# Patient Record
Sex: Female | Born: 1935
Health system: Southern US, Community
[De-identification: ages and names within clinical notes are randomized; demographics above are authoritative.]

## PROBLEM LIST (undated history)

## (undated) DIAGNOSIS — R0602 Shortness of breath: Secondary | ICD-10-CM

## (undated) DIAGNOSIS — M1990 Unspecified osteoarthritis, unspecified site: Secondary | ICD-10-CM

## (undated) DIAGNOSIS — I499 Cardiac arrhythmia, unspecified: Secondary | ICD-10-CM

## (undated) DIAGNOSIS — I10 Essential (primary) hypertension: Secondary | ICD-10-CM

## (undated) DIAGNOSIS — E079 Disorder of thyroid, unspecified: Secondary | ICD-10-CM

## (undated) DIAGNOSIS — G4730 Sleep apnea, unspecified: Secondary | ICD-10-CM

## (undated) DIAGNOSIS — E785 Hyperlipidemia, unspecified: Secondary | ICD-10-CM

## (undated) DIAGNOSIS — Z8489 Family history of other specified conditions: Secondary | ICD-10-CM

## (undated) DIAGNOSIS — E039 Hypothyroidism, unspecified: Secondary | ICD-10-CM

## (undated) DIAGNOSIS — K219 Gastro-esophageal reflux disease without esophagitis: Secondary | ICD-10-CM

## (undated) DIAGNOSIS — Z950 Presence of cardiac pacemaker: Secondary | ICD-10-CM

## (undated) HISTORY — DX: Sleep apnea, unspecified: G47.30

## (undated) HISTORY — DX: Cardiac arrhythmia, unspecified: I49.9

## (undated) HISTORY — PX: EYE SURGERY: SHX253

## (undated) HISTORY — PX: DILATION AND CURETTAGE OF UTERUS: SHX78

## (undated) HISTORY — DX: Hyperlipidemia, unspecified: E78.5

## (undated) HISTORY — PX: KNEE ARTHROSCOPY: SHX127

---

## 1997-07-16 ENCOUNTER — Other Ambulatory Visit: Admission: RE | Admit: 1997-07-16 | Discharge: 1997-07-16 | Payer: Self-pay | Admitting: Obstetrics and Gynecology

## 1998-07-15 ENCOUNTER — Other Ambulatory Visit: Admission: RE | Admit: 1998-07-15 | Discharge: 1998-07-15 | Payer: Self-pay | Admitting: Obstetrics and Gynecology

## 1999-07-22 ENCOUNTER — Other Ambulatory Visit: Admission: RE | Admit: 1999-07-22 | Discharge: 1999-07-22 | Payer: Self-pay | Admitting: Obstetrics and Gynecology

## 1999-09-10 ENCOUNTER — Encounter: Payer: Self-pay | Admitting: Orthopedic Surgery

## 1999-09-10 ENCOUNTER — Ambulatory Visit (HOSPITAL_COMMUNITY): Admission: RE | Admit: 1999-09-10 | Discharge: 1999-09-10 | Payer: Self-pay | Admitting: Orthopedic Surgery

## 1999-09-23 ENCOUNTER — Encounter: Admission: RE | Admit: 1999-09-23 | Discharge: 1999-09-30 | Payer: Self-pay | Admitting: Orthopedic Surgery

## 2000-09-09 ENCOUNTER — Other Ambulatory Visit: Admission: RE | Admit: 2000-09-09 | Discharge: 2000-09-09 | Payer: Self-pay | Admitting: Obstetrics and Gynecology

## 2001-07-06 HISTORY — PX: CARDIAC CATHETERIZATION: SHX172

## 2001-09-14 ENCOUNTER — Other Ambulatory Visit: Admission: RE | Admit: 2001-09-14 | Discharge: 2001-09-14 | Payer: Self-pay | Admitting: Obstetrics and Gynecology

## 2004-08-21 ENCOUNTER — Encounter (INDEPENDENT_AMBULATORY_CARE_PROVIDER_SITE_OTHER): Payer: Self-pay | Admitting: Family Medicine

## 2004-08-27 ENCOUNTER — Ambulatory Visit: Payer: Self-pay | Admitting: Internal Medicine

## 2004-08-27 ENCOUNTER — Ambulatory Visit (HOSPITAL_COMMUNITY): Admission: RE | Admit: 2004-08-27 | Discharge: 2004-08-27 | Payer: Self-pay | Admitting: Internal Medicine

## 2004-10-15 ENCOUNTER — Other Ambulatory Visit: Admission: RE | Admit: 2004-10-15 | Discharge: 2004-10-15 | Payer: Self-pay | Admitting: Obstetrics and Gynecology

## 2005-03-05 ENCOUNTER — Ambulatory Visit: Payer: Self-pay | Admitting: Internal Medicine

## 2005-03-12 ENCOUNTER — Ambulatory Visit (HOSPITAL_COMMUNITY): Admission: RE | Admit: 2005-03-12 | Discharge: 2005-03-12 | Payer: Self-pay | Admitting: Internal Medicine

## 2005-03-23 ENCOUNTER — Encounter (INDEPENDENT_AMBULATORY_CARE_PROVIDER_SITE_OTHER): Payer: Self-pay | Admitting: Family Medicine

## 2005-03-24 ENCOUNTER — Inpatient Hospital Stay (HOSPITAL_COMMUNITY): Admission: EM | Admit: 2005-03-24 | Discharge: 2005-03-27 | Payer: Self-pay | Admitting: Emergency Medicine

## 2005-03-26 ENCOUNTER — Encounter (INDEPENDENT_AMBULATORY_CARE_PROVIDER_SITE_OTHER): Payer: Self-pay | Admitting: Family Medicine

## 2005-03-26 LAB — CONVERTED CEMR LAB: Blood Glucose, Fasting: 106 mg/dL

## 2005-03-27 ENCOUNTER — Ambulatory Visit: Payer: Self-pay | Admitting: Internal Medicine

## 2005-03-27 ENCOUNTER — Encounter: Payer: Self-pay | Admitting: Internal Medicine

## 2005-04-09 ENCOUNTER — Ambulatory Visit: Payer: Self-pay | Admitting: Internal Medicine

## 2005-05-11 ENCOUNTER — Ambulatory Visit: Payer: Self-pay | Admitting: Internal Medicine

## 2005-06-09 ENCOUNTER — Ambulatory Visit: Payer: Self-pay | Admitting: Internal Medicine

## 2005-06-22 ENCOUNTER — Ambulatory Visit (HOSPITAL_COMMUNITY): Admission: RE | Admit: 2005-06-22 | Discharge: 2005-06-22 | Payer: Self-pay | Admitting: Internal Medicine

## 2005-06-22 ENCOUNTER — Ambulatory Visit: Payer: Self-pay | Admitting: Internal Medicine

## 2005-06-23 ENCOUNTER — Encounter (INDEPENDENT_AMBULATORY_CARE_PROVIDER_SITE_OTHER): Payer: Self-pay | Admitting: Internal Medicine

## 2005-07-07 ENCOUNTER — Ambulatory Visit: Payer: Self-pay | Admitting: Internal Medicine

## 2005-08-21 ENCOUNTER — Encounter (HOSPITAL_COMMUNITY): Admission: RE | Admit: 2005-08-21 | Discharge: 2005-09-20 | Payer: Self-pay | Admitting: Internal Medicine

## 2005-09-04 ENCOUNTER — Ambulatory Visit: Payer: Self-pay | Admitting: Internal Medicine

## 2005-11-05 ENCOUNTER — Ambulatory Visit: Payer: Self-pay | Admitting: Internal Medicine

## 2005-12-03 ENCOUNTER — Ambulatory Visit: Payer: Self-pay | Admitting: Internal Medicine

## 2006-02-18 ENCOUNTER — Encounter: Payer: Self-pay | Admitting: Family Medicine

## 2006-02-18 DIAGNOSIS — I498 Other specified cardiac arrhythmias: Secondary | ICD-10-CM | POA: Insufficient documentation

## 2006-02-18 DIAGNOSIS — K6389 Other specified diseases of intestine: Secondary | ICD-10-CM

## 2006-02-18 DIAGNOSIS — M1990 Unspecified osteoarthritis, unspecified site: Secondary | ICD-10-CM | POA: Insufficient documentation

## 2006-02-18 DIAGNOSIS — F329 Major depressive disorder, single episode, unspecified: Secondary | ICD-10-CM

## 2006-02-18 DIAGNOSIS — E039 Hypothyroidism, unspecified: Secondary | ICD-10-CM | POA: Insufficient documentation

## 2006-02-18 DIAGNOSIS — M545 Low back pain: Secondary | ICD-10-CM

## 2006-02-18 DIAGNOSIS — D131 Benign neoplasm of stomach: Secondary | ICD-10-CM

## 2006-02-18 DIAGNOSIS — I10 Essential (primary) hypertension: Secondary | ICD-10-CM | POA: Insufficient documentation

## 2006-02-18 DIAGNOSIS — Z8719 Personal history of other diseases of the digestive system: Secondary | ICD-10-CM

## 2006-02-18 DIAGNOSIS — K219 Gastro-esophageal reflux disease without esophagitis: Secondary | ICD-10-CM | POA: Insufficient documentation

## 2006-02-25 ENCOUNTER — Ambulatory Visit: Payer: Self-pay | Admitting: Internal Medicine

## 2006-03-29 ENCOUNTER — Ambulatory Visit: Payer: Self-pay | Admitting: Internal Medicine

## 2006-03-29 LAB — CONVERTED CEMR LAB
ALT: 13 units/L (ref 0–35)
AST: 16 units/L (ref 0–37)
BUN: 20 mg/dL (ref 6–23)
CO2: 24 meq/L (ref 19–32)
Cholesterol: 175 mg/dL (ref 0–200)
Creatinine, Ser: 0.92 mg/dL (ref 0.40–1.20)
Glucose, Bld: 97 mg/dL (ref 70–99)
LDL Cholesterol: 98 mg/dL (ref 0–99)
Potassium: 4.5 meq/L (ref 3.5–5.3)
Sodium: 139 meq/L (ref 135–145)
TSH: 1.252 microintl units/mL (ref 0.350–5.50)
Total CHOL/HDL Ratio: 2.8
Triglycerides: 73 mg/dL (ref <150)

## 2006-06-28 ENCOUNTER — Telehealth (INDEPENDENT_AMBULATORY_CARE_PROVIDER_SITE_OTHER): Payer: Self-pay | Admitting: Internal Medicine

## 2006-07-07 ENCOUNTER — Encounter (INDEPENDENT_AMBULATORY_CARE_PROVIDER_SITE_OTHER): Payer: Self-pay | Admitting: Internal Medicine

## 2006-07-07 ENCOUNTER — Ambulatory Visit: Payer: Self-pay | Admitting: Internal Medicine

## 2006-08-23 LAB — CONVERTED CEMR LAB: Pap Smear: NORMAL

## 2006-09-29 ENCOUNTER — Telehealth (INDEPENDENT_AMBULATORY_CARE_PROVIDER_SITE_OTHER): Payer: Self-pay | Admitting: Internal Medicine

## 2006-09-30 ENCOUNTER — Ambulatory Visit: Payer: Self-pay | Admitting: Internal Medicine

## 2006-09-30 DIAGNOSIS — R5381 Other malaise: Secondary | ICD-10-CM

## 2006-09-30 DIAGNOSIS — E785 Hyperlipidemia, unspecified: Secondary | ICD-10-CM | POA: Insufficient documentation

## 2006-09-30 DIAGNOSIS — R209 Unspecified disturbances of skin sensation: Secondary | ICD-10-CM

## 2006-09-30 DIAGNOSIS — R5383 Other fatigue: Secondary | ICD-10-CM

## 2007-02-21 ENCOUNTER — Encounter (INDEPENDENT_AMBULATORY_CARE_PROVIDER_SITE_OTHER): Payer: Self-pay | Admitting: Internal Medicine

## 2007-05-13 ENCOUNTER — Ambulatory Visit: Payer: Self-pay | Admitting: Internal Medicine

## 2007-05-14 LAB — CONVERTED CEMR LAB
ALT: 17 units/L (ref 0–35)
AST: 16 units/L (ref 0–37)
Albumin: 4.1 g/dL (ref 3.5–5.2)
Alkaline Phosphatase: 96 units/L (ref 39–117)
CO2: 22 meq/L (ref 19–32)
Chloride: 103 meq/L (ref 96–112)
Cholesterol: 184 mg/dL (ref 0–200)
Free T4: 1.71 ng/dL (ref 0.89–1.80)
Glucose, Bld: 106 mg/dL — ABNORMAL HIGH (ref 70–99)
HDL: 55 mg/dL (ref >39)
TSH: 1.582 microintl units/mL (ref 0.350–5.50)
Total Bilirubin: 0.5 mg/dL (ref 0.3–1.2)
Total CHOL/HDL Ratio: 3.3
Total Protein: 6.8 g/dL (ref 6.0–8.3)

## 2007-05-16 ENCOUNTER — Telehealth (INDEPENDENT_AMBULATORY_CARE_PROVIDER_SITE_OTHER): Payer: Self-pay | Admitting: *Deleted

## 2007-06-12 ENCOUNTER — Emergency Department (HOSPITAL_COMMUNITY): Admission: EM | Admit: 2007-06-12 | Discharge: 2007-06-12 | Payer: Self-pay | Admitting: Emergency Medicine

## 2007-06-29 ENCOUNTER — Ambulatory Visit: Payer: Self-pay | Admitting: Internal Medicine

## 2007-06-29 DIAGNOSIS — M940 Chondrocostal junction syndrome [Tietze]: Secondary | ICD-10-CM | POA: Insufficient documentation

## 2007-07-14 ENCOUNTER — Ambulatory Visit: Payer: Self-pay | Admitting: Internal Medicine

## 2007-07-14 ENCOUNTER — Encounter (INDEPENDENT_AMBULATORY_CARE_PROVIDER_SITE_OTHER): Payer: Self-pay | Admitting: Internal Medicine

## 2007-08-04 ENCOUNTER — Encounter (INDEPENDENT_AMBULATORY_CARE_PROVIDER_SITE_OTHER): Payer: Self-pay | Admitting: Internal Medicine

## 2007-09-09 ENCOUNTER — Encounter (INDEPENDENT_AMBULATORY_CARE_PROVIDER_SITE_OTHER): Payer: Self-pay | Admitting: Internal Medicine

## 2007-09-12 ENCOUNTER — Ambulatory Visit (HOSPITAL_COMMUNITY): Admission: RE | Admit: 2007-09-12 | Discharge: 2007-09-12 | Payer: Self-pay

## 2007-09-12 ENCOUNTER — Ambulatory Visit: Payer: Self-pay | Admitting: Internal Medicine

## 2007-09-13 ENCOUNTER — Ambulatory Visit: Payer: Self-pay | Admitting: Internal Medicine

## 2007-09-13 LAB — CONVERTED CEMR LAB
ALT: 13 units/L (ref 0–35)
AST: 15 units/L (ref 0–37)
Basophils Relative: 1 % (ref 0–1)
CO2: 25 meq/L (ref 19–32)
Creatinine, Ser: 0.81 mg/dL (ref 0.40–1.20)
Eosinophils Absolute: 0.2 10*3/uL (ref 0.0–0.7)
Glucose, Bld: 104 mg/dL — ABNORMAL HIGH (ref 70–99)
HCT: 40.7 % (ref 36.0–46.0)
Hemoglobin: 13.1 g/dL (ref 12.0–15.0)
Monocytes Relative: 10 % (ref 3–12)
Neutrophils Relative %: 48 % (ref 43–77)
Platelets: 219 10*3/uL (ref 150–400)
Potassium: 4 meq/L (ref 3.5–5.3)
RDW: 14.3 % (ref 11.5–15.5)
Total Protein: 6.5 g/dL (ref 6.0–8.3)
WBC: 5.2 10*3/uL (ref 4.0–10.5)

## 2007-09-20 ENCOUNTER — Telehealth (INDEPENDENT_AMBULATORY_CARE_PROVIDER_SITE_OTHER): Payer: Self-pay | Admitting: Internal Medicine

## 2007-11-04 ENCOUNTER — Encounter (INDEPENDENT_AMBULATORY_CARE_PROVIDER_SITE_OTHER): Payer: Self-pay | Admitting: Internal Medicine

## 2008-01-05 ENCOUNTER — Ambulatory Visit: Payer: Self-pay | Admitting: Internal Medicine

## 2008-01-05 DIAGNOSIS — R197 Diarrhea, unspecified: Secondary | ICD-10-CM | POA: Insufficient documentation

## 2008-01-05 DIAGNOSIS — L538 Other specified erythematous conditions: Secondary | ICD-10-CM | POA: Insufficient documentation

## 2008-01-06 ENCOUNTER — Encounter (INDEPENDENT_AMBULATORY_CARE_PROVIDER_SITE_OTHER): Payer: Self-pay | Admitting: Internal Medicine

## 2008-01-11 ENCOUNTER — Encounter (INDEPENDENT_AMBULATORY_CARE_PROVIDER_SITE_OTHER): Payer: Self-pay | Admitting: Internal Medicine

## 2008-04-25 ENCOUNTER — Encounter (INDEPENDENT_AMBULATORY_CARE_PROVIDER_SITE_OTHER): Payer: Self-pay | Admitting: Internal Medicine

## 2008-04-30 ENCOUNTER — Ambulatory Visit: Payer: Self-pay | Admitting: Internal Medicine

## 2008-04-30 DIAGNOSIS — M25559 Pain in unspecified hip: Secondary | ICD-10-CM

## 2008-04-30 DIAGNOSIS — J310 Chronic rhinitis: Secondary | ICD-10-CM

## 2008-04-30 DIAGNOSIS — M25569 Pain in unspecified knee: Secondary | ICD-10-CM

## 2008-05-23 ENCOUNTER — Encounter (INDEPENDENT_AMBULATORY_CARE_PROVIDER_SITE_OTHER): Payer: Self-pay | Admitting: Internal Medicine

## 2008-06-15 ENCOUNTER — Telehealth (INDEPENDENT_AMBULATORY_CARE_PROVIDER_SITE_OTHER): Payer: Self-pay | Admitting: Internal Medicine

## 2008-06-15 ENCOUNTER — Ambulatory Visit: Payer: Self-pay | Admitting: Internal Medicine

## 2008-06-15 DIAGNOSIS — M779 Enthesopathy, unspecified: Secondary | ICD-10-CM | POA: Insufficient documentation

## 2008-06-20 ENCOUNTER — Telehealth (INDEPENDENT_AMBULATORY_CARE_PROVIDER_SITE_OTHER): Payer: Self-pay | Admitting: Internal Medicine

## 2008-07-23 ENCOUNTER — Encounter (INDEPENDENT_AMBULATORY_CARE_PROVIDER_SITE_OTHER): Payer: Self-pay | Admitting: Internal Medicine

## 2008-07-25 ENCOUNTER — Encounter (INDEPENDENT_AMBULATORY_CARE_PROVIDER_SITE_OTHER): Payer: Self-pay | Admitting: Internal Medicine

## 2008-08-08 ENCOUNTER — Encounter (INDEPENDENT_AMBULATORY_CARE_PROVIDER_SITE_OTHER): Payer: Self-pay | Admitting: Internal Medicine

## 2008-12-12 ENCOUNTER — Encounter (INDEPENDENT_AMBULATORY_CARE_PROVIDER_SITE_OTHER): Payer: Self-pay | Admitting: Internal Medicine

## 2008-12-19 ENCOUNTER — Encounter (INDEPENDENT_AMBULATORY_CARE_PROVIDER_SITE_OTHER): Payer: Self-pay | Admitting: Internal Medicine

## 2009-02-26 ENCOUNTER — Ambulatory Visit: Payer: Self-pay | Admitting: Internal Medicine

## 2009-03-05 ENCOUNTER — Telehealth: Payer: Self-pay | Admitting: Internal Medicine

## 2009-03-19 ENCOUNTER — Encounter: Payer: Self-pay | Admitting: Internal Medicine

## 2009-05-11 ENCOUNTER — Emergency Department (HOSPITAL_COMMUNITY): Admission: EM | Admit: 2009-05-11 | Discharge: 2009-05-11 | Payer: Self-pay | Admitting: Emergency Medicine

## 2009-06-14 ENCOUNTER — Encounter: Admission: RE | Admit: 2009-06-14 | Discharge: 2009-06-14 | Payer: Self-pay | Admitting: Orthopedic Surgery

## 2009-09-13 ENCOUNTER — Encounter (INDEPENDENT_AMBULATORY_CARE_PROVIDER_SITE_OTHER): Payer: Self-pay | Admitting: *Deleted

## 2009-12-23 ENCOUNTER — Ambulatory Visit (HOSPITAL_COMMUNITY): Admission: RE | Admit: 2009-12-23 | Discharge: 2009-12-23 | Payer: Self-pay | Admitting: Cardiovascular Disease

## 2010-03-26 ENCOUNTER — Encounter
Admission: RE | Admit: 2010-03-26 | Discharge: 2010-03-26 | Payer: Self-pay | Source: Home / Self Care | Attending: Internal Medicine | Admitting: Internal Medicine

## 2010-04-20 LAB — CONVERTED CEMR LAB
BUN: 23 mg/dL (ref 6–23)
Blood Glucose, Fingerstick: 113
Calcium: 9 mg/dL (ref 8.4–10.5)
Chloride: 106 meq/L (ref 96–112)
Creatinine, Ser: 0.94 mg/dL (ref 0.40–1.20)
Free T4: 1.47 ng/dL (ref 0.89–1.80)
Glucose, Bld: 100 mg/dL — ABNORMAL HIGH (ref 70–99)
Potassium: 4.7 meq/L (ref 3.5–5.3)
Sodium: 140 meq/L (ref 135–145)
TSH: 0.945 microintl units/mL (ref 0.350–4.50)
Total Bilirubin: 0.3 mg/dL (ref 0.3–1.2)
Vit D, 1,25-Dihydroxy: 45 (ref 30–89)

## 2010-04-22 NOTE — Letter (Signed)
Summary: Recall Office Visit  [facility]  [address]   [place], [place] [address]   Phone: [phone]  Fax: [phone]      September 13, 2009   Denise Pugh [address] [facility] [address] [place], [place]  [address] 25-Sep-1928   Dear [name],   According to our records, it is time for you to schedule a follow-up office visit with [place].   At your convenience, please call [phone] to schedule an office visit. If you have any questions, concerns, or feel that this letter is in error, we would appreciate your call.   Sincerely,    Diana Eves  [facility] Ph: [phone]   Fax: [phone]

## 2010-08-05 NOTE — Assessment & Plan Note (Signed)
NAME:  Denise Pugh, Denise Pugh                  CHART#:  [id number]   DATE:  07/14/2007                       DOB:  1928-04-17   PRIMARY CARE PHYSICIAN:  Dr. Jen Mow.   CHIEF COMPLAINT:  Follow up GERD.   SUBJECTIVE:  Denise Pugh is a 77 year old female with a history of chronic  GERD and constipation.  She has been doing very well on Protonix 40 mg  b.i.d.  Last EGD was in 2007, which showed benign gastric polyps and was  otherwise normal.  Her last colonoscopy was August 27, 2004.  She was found  to have an anal papilla and internal hemorrhoids, diffuse pigmented  colonic mucosa consistent with melanosis coli and left-sided  diverticula.  Otherwise the exam was normal.  She is very pleased with  her health right now.  She denies any dysphagia or odynophagia.  Denies  anorexia or satiety.  Her weight is steadily increasing.  She is not  having to use MiraLax as frequently as she once did as she has increased  her dietary fiber and this is working well for her.  She has been seen  by her gynecologist and had a DEXA scan for osteoporosis, which was  normal per her report.   CURRENT MEDICATIONS:  See the list from July 14, 2007.   ALLERGIES:  TETANUS AND PENICILLIN.   OBJECTIVE:  VITAL SIGNS:  Weight 186 pounds, height 64 inches, temp  97.9, blood pressure 120/74 and pulse 60.  GENERAL:  Denise Pugh is a well-developed, well-nourished Caucasian female  in no acute distress.  HEENT:  Sclerae nonicteric.  Conjunctivae pink.  Oropharynx pink, moist  without any lesions.  CHEST:  Heart regular rhythm rate and rhythm.  Normal S1, S2.  ABDOMEN:  Positive bowel sounds x4.  No bruits auscultated.  Soft,  nontender without palpable mass, megaly, tenderness, or guarding.  EXTREMITIES:  Without clubbing or edema.   ASSESSMENT:  Denise Pugh is a 77 year old female with chronic  gastroesophageal reflux disease, doing well on b.i.d. proton pump  inhibitor.  I suspect that she may be able to decrease her dosage to  once daily.   PLAN:  1. A trial of decreasing Protonix to 40 mg daily.  If she has      problems, she will go back on b.i.d. dosing.  2. Follow-up office visit in 2 years with Dr. Jena Gauss, or sooner if she      has any problems.  3. She is going to follow up with Dr. Jen Mow regarding her health      maintenance issues.       Lorenza Burton, N.P.  Electronically Signed     R. Roetta Sessions, M.D.  Electronically Signed    KJ/MEDQ  D:  07/14/2007  T:  07/14/2007  Job:  [id number]

## 2010-08-08 NOTE — H&P (Signed)
[name], [name]                 ACCOUNT NO.:  [id number]   MEDICAL RECORD NO.:  [id number]          PATIENT TYPE:  INP   LOCATION:  ED99                          FACILITY:  [facility]   PHYSICIAN:  Margaretmary Dys, M.D.DATE OF BIRTH:  [birth year]   DATE OF ADMISSION:  03/24/2005  DATE OF DISCHARGE:  LH                                HISTORY & PHYSICAL   PRIMARY CARE PHYSICIAN:  Donna Bernard, M.D.   ADMISSION DIAGNOSES:  1.  Left upper back pain.  2.  Chest pain, rule out myocardial infarction.  3.  Hypertension.   CHIEF COMPLAINT:  Pain just below the shoulder blade and substernal chest  pain of one day's duration.   HISTORY OF PRESENT ILLNESS:  [name] is a 77 year old Caucasian  female who presented to the emergency room today with complaints initially  of pain involving the back under her left shoulder blade. She also had some  substernal pressure which she described as diffuse and the feeling of  somebody sitting on her chest. The pain from her substernal radiated to her  back. The pain was constant with no aggravating or relieving factors. On  arrival here in the emergency room, she described the pain as 5/10. The  patient has received some morphine with some improvement in the pain.  Currently, she says the pain is 0/10. She also had some nausea. She felt a  little cold and sweaty but denies any classic symptoms of diaphoresis. She  had no headaches, no dizziness, or lightheadedness. She has no fevers or  chills. The pain is not pleuritic. She denies any cough. No shortness of  breath. No decreased exercise tolerance. She has had no edema in her lower  extremities. She denies any orthopnea, paroxysm nocturnal dyspnea. She has  had no palpitations.   Initial evaluation in the emergency room revealed her blood pressure was  significantly elevated at 171/75. EKG showed left ventricular hypertrophy.  Chest x-ray showed cardiomegaly. Cardiac enzymes were negative.  However, the  patient's history of the pain has significant left ventricular hypertrophy  and history of arrhythmia in the past. It was decided that it we would admit  her to evaluate for possible coronary artery disease.   REVIEW OF SYSTEMS:  A 10-point review of system is otherwise negative except  as mentioned in the history of present illness.   PAST MEDICAL HISTORY:  1.  Hypertension.  2.  Gastroesophageal reflux disease.  3.  Arrhythmia.  4.  Depression.  5.  Hypothyroidism.   PAST SURGICAL HISTORY:  Arthroscopic knee surgery.   MEDICATIONS:  1.  She is on Ditropan 10 milligrams p.o. daily.  2.  Protonix 40 milligrams p.o. b.i.d.  3.  Synthroid 75 mcg once a day.  4.  Lexapro 10 milligrams p.o. daily.  5.  Aleve p.r.n.  6.  Tarka 2/240 milligrams p.o. once a day.   ALLERGIES:  The patient reports allergy to PENICILLIN, TETANUS TOXOID which  she reports an anaphylaxis reaction.   FAMILY HISTORY:  Father died from Parkinson's disease. Both parents died in  their  late 57s. No significant history of cardiac disease. She has a brother  who is otherwise healthy.   SOCIAL HISTORY:  The patient is married. Has two children who are healthy.   She is lifelong nonsmoker. Does not drink alcohol. She remains fairly  active. She is retired from [employer]. She was a Engineer, materials. She denies any  alcohol or illicit drug use.   PHYSICAL EXAMINATION:  GENERAL:  Conscious, alert, comfortable, pleasant,  not in acute distress.  VITAL SIGNS:  Her blood pressure was 171/75, pulse of 58, respiratory rate  of 20, temperature 97 degrees Fahrenheit. Pain was 5/10 on arrival,  currently is 0/10. Pulse oximetry was 97% on room air.  HEENT:  Normocephalic and atraumatic. Oral mucosa was moist with no  exudates.  NECK:  Supple. No JVD and no lymphadenopathy.  LUNGS:  Clear clinically with good air entry bilaterally.  HEART:  S1 and S2 regular. No S3, S4, gallops or rubs.  ABDOMEN:  Soft and  nontender. Bowel sounds were positive.  EXTREMITIES:  No pitting pedal edema.  MUSCULOSKELETAL:  There was some tenderness over the left scapula  inferiorly.  EXTREMITIES:  No pitting or pedal edema.  CNS:  Grossly intact with no focal deficits.   LABORATORY DATA:  White blood cell count was 9.4, hemoglobin of 13.3,  hematocrit 39.6, platelet count 226,000 with no left shift. Sodium 138,  potassium 3.8, chloride of 102, CO2 29, glucose 110, BUN 16, creatinine 1.0,  calcium 8.6, cardiac enzymes were negative x2. Other cardiac markers were  negative x2. Chest x-ray showed stable cardiomegaly. A 12-lead EKG shows  sinus bradycardia at a rate of 58. No acute ST or T changes noted. The  patient has significant left ventricular hypertrophy.   ASSESSMENT/PLAN:  [name] is a 77 year old Caucasian female presenting  with symptoms that may overlap between typical and atypical angina. She does  have risk factors including hypertension and left ventricular hypertrophy on  EKG and chest x-ray.   The plan is to admit her at this time. We will rule out for a myocardial  infarction with cardiac enzymes. We will request cardiology to see her. May  resume all her previous medications. Gastrointestinal prophylaxis will be  with Protonix as the patient is taking at home. Deep vein thrombosis  prophylaxis with Lovenox.   CODE STATUS:  The patient is a full code.   I discussed the above plan with her and the potential that she may have a  stress test. We may proceed on to cardiac catheterization depending on the  cardiologist's opinion in the morning. The patient verbalized full  understanding.      Margaretmary Dys, M.D.  Electronically Signed     AM/MEDQ  D:  03/24/2005  T:  03/24/2005  Job:  [id number]

## 2010-08-08 NOTE — Op Note (Signed)
NAME:  Denise Pugh, Denise Pugh                 ACCOUNT NO.:  [id number]   MEDICAL RECORD NO.:  [id number]          PATIENT TYPE:  INP   LOCATION:  A201                          FACILITY:  [facility]   PHYSICIAN:  R. Roetta Sessions, M.D. DATE OF BIRTH:  [birth year]   DATE OF PROCEDURE:  03/27/2005  DATE OF DISCHARGE:                                 OPERATIVE REPORT   PROCEDURE:  Esophagogastroduodenoscopy with KOH esophageal brushing and  gastric biopsy.   INDICATIONS FOR PROCEDURE:  The patient is a 77 year old lady admitted to  the hospital with left scapular pain. Workup for this problem has been  unrevealing. Symptoms felt to be coming from musculoskeletal origin. She has  long-standing gastroesophageal reflux disease and has never had an EGD. EGD  is now being done prior to her going home. This approach has been discussed  with the patient at length. Potential risks, benefits, and alternatives have  been reviewed and questions answered. She is agreeable. Please see  documentation in the medical record.   PROCEDURE NOTE:  O2 saturation, blood pressure, pulse, and respirations were  monitored throughout the entire procedure. Conscious sedation with Versed 3  mg IV and Demerol 75 mg IV in divided doses.   INSTRUMENT:  Olympus video chip system.   FINDINGS:  Examination of the tubular esophagus revealed ribbon-like plaques  in a linear distribution throughout most of the proximal mid esophagus.  Please see photos. They were not really cheesy, consistent with what is  typically seen with candida esophagitis. However, they did certainly stand  out. Otherwise, esophageal mucosa appeared normal. EGD junction was easily  traversed.   Stomach:  Gastric cavity was empty and insufflated well with air. Thorough  examination of gastric mucosa including retroflexed view of the proximal  stomach and esophagogastric junction was undertaken. The patient had  numerous 3 to 6 mm polyps, dozens throughout the  gastric mucosa. In the  antrum, there is a 1-cm submucosa lesion with a central erythematous  depression consistent with most likely a leiomyoma. Please see photos.  Otherwise gastric mucosa appeared normal. Pylorus patent and easily  traversed. Examination of bulb and second portion revealed no abnormalities.   THERAPEUTIC/DIAGNOSTIC MANEUVERS:  1.  The esophageal mucosa was brushed for KOH prep.  2.  One of the polyps in the stomach was biopsied for histologic study.  3.  The head of the submucosa lesion with a central depression was also      biopsied for histologic study.   The patient tolerated the procedure well and was reactive to endoscopy.   IMPRESSION:  1.  Some ribbon-like plaques of the esophageal mucosa as described above,      status post KOH brush. Otherwise normal esophagus.  2.  Multiple gastric polyps. One biopsied. Submucosal lesion with central      depression in the antrum suggestive of a leiomyoma, also biopsied. The      remainder of the gastric mucosa appeared normal. Patent pylorus. Normal      D1 and D2.   RECOMMENDATIONS:  1.  Advance diet as tolerated.  2.  From a GI standpoint, this lady can go home later today. We will follow      up on pending studies. I will be glad to see this nice lady back in the      office in a few weeks.      Jonathon Bellows, M.D.  Electronically Signed     RMR/MEDQ  D:  03/27/2005  T:  03/27/2005  Job:  [id number]   cc:   Donna Bernard, M.D.  Fax: [phone]

## 2010-08-08 NOTE — Discharge Summary (Signed)
NAME:  Denise Pugh, Denise Pugh NO.:  [id number]   MEDICAL RECORD NO.:  [id number]          PATIENT TYPE:  INP   LOCATION:  A201                          FACILITY:  [facility]   PHYSICIAN:  Donna Bernard, M.D.DATE OF BIRTH:  1928-03-25   DATE OF ADMISSION:  03/24/2005  DATE OF DISCHARGE:  01/05/2007LH                                 DISCHARGE SUMMARY   DISCHARGE DIAGNOSES:  1.  Severe back pain.  2.  Pulmonary embolus ruled out.  3.  Likely musculoskeletal injury.  4.  Myocardial infarction ruled out.  5.  Hypertension.   DISPOSITION:  The patient is discharged to home.   DISCHARGE MEDICATIONS:  1.  Cortodoxone 500 mg one three times a day.  2.  Percocet 5/325 one as needed every 4 hours for severe pain.  3.  Reglan 5 mg orally as needed for nausea.   HISTORY OF PRESENT ILLNESS:  Please see initial H&P as dictated.   HOSPITAL COURSE:  This patient is a 77 year old, white female with a  relatively benign prior medical history.  She presented to the emergency  room with complaints of severe pain.  She noted the pain was primarily in  the left shoulder blade region with some radiation to the anterior chest.  The patient had some nausea associated with it.  She also felt a little  sweaty with it.  No significant shortness of breath or pleuritic component  noted.  The patient presented to the emergency room and she had cardiac  enzymes which were negative.  EKG was fairly benign.  The patient had  moderately severe pain.  The patient also is in the midst of a workup by Dr.  Jena Gauss for reflux type symptoms.  Cardiologists were consulted and they  assessed the patient.  They thought it was very unlikely that it was  ischemic pain.  They felt it was likely musculoskeletal pain.  CT scan of  the chest was done to rule out aneurysm or PE.  This returned negative.  GI  folks were asked to assess the patient during the midst of evaluating her  for reflux.  They felt that the  patient likely was not experiencing reflux.  In addition, they had recommended pressing on and doing a HIDA scan.  This  was performed with no significant abnormality noted.  An upper endoscopy was  performed.  Please see Dr. Luvenia Starch notes for further results.  It was the  feeling of GI that her symptoms did not correlate with reflux.   On the day of discharge, the patient was feeling better.  She still had  significant discomfort, however, she was improved.  The patient was advised  that this was likely musculoskeletal pain that would probably take quite  awhile to fade on out.  We put her on antiinflammatory muscle spasm  medicine.  The patient was discharged home with diagnoses and disposition as  noted above.      Donna Bernard, M.D.  Electronically Signed     WSL/MEDQ  D:  04/05/2005  T:  04/06/2005  Job:  [id number] 

## 2010-08-08 NOTE — Op Note (Signed)
NAME:  Denise Pugh, BRANSCUM                 ACCOUNT NO.:  [id number]   MEDICAL RECORD NO.:  [id number]          PATIENT TYPE:  AMB   LOCATION:  DAY                           FACILITY:  [facility]   PHYSICIAN:  R. Roetta Sessions, M.D. DATE OF BIRTH:  10-04-1928   DATE OF PROCEDURE:  08/27/2004  DATE OF DISCHARGE:                                 OPERATIVE REPORT   PROCEDURE:  Screening colonoscopy.   INDICATIONS FOR PROCEDURE:  The patient is a 77 year old Caucasian female  referred by Dr. Lubertha South for colon cancer screening.  She had a  sigmoidoscopy back in 2000, which revealed only left-sided diverticula.  She  has not had any GI symptoms, no lower GI tract symptoms.  There is no family  history of colorectal neoplasia.  She has never had her entire lower GI  tract imaged.  Colonoscopy is now being down as a standard screening  maneuver.  The procedure discussed with the patient at length, potential  risks, benefits and alternatives have been reviewed, and questions answered.  She was agreeable.  Please see documentation in the medical record.   PREOPERATIVE MEDICATIONS:  Gentamicin 110 mg IV, and vancomycin 1 gram IV  for SBE prophylaxis.   PROCEDURE NOTE:  O2 saturation, blood pressure, pulse and respirations  monitored throughout the entire procedure.  Conscious sedation and Versed 4  mg IV, Demerol 75 mg IV in divided doses.   INSTRUMENT:  Olympus video chip system.   FINDINGS:  Digital rectal examination revealed no abnormalities.  Endoscopic  findings:  The prep was good.   Rectal and colon examination:  Rectal mucosa including retroflexed view of  the anal verge revealed only anal papilla and internal hemorrhoids.   COLON:  Colonic mucosa was surveyed from the rectosigmoid junction to the  left transverse right colon, the appendiceal orifice, ileocecal valve and  cecum.  The structures were well seen and photographed for the record.  From  this level, the scope was slowly  withdrawn while previously mentioned  mucosal surfaces were again seen.  The patient was noted to have to have the  following abnormalities:  (1) Extensive left-sided diverticula.  (2)  Diffusely pigmented colonic mucosa consistent with melanosis coli.  (3) The  remainder of the colonic mucosa appeared normal.  The patient tolerated the  procedure well and was reactive in endoscopy.   IMPRESSION:  1.  Anal papilla and internal hemorrhoids, otherwise normal rectum.  2.  Diffusely pigmented colonic mucosa consistent with melanosis coli.  3.  Left-sided diverticula.  4.  The remainder of the colonic mucosa appeared normal.   RECOMMENDATIONS:  1.  Diverticulosis literature provided to Ms. Deane.  2.  Repeat screening colonoscopy in 10 years.       RMR/MEDQ  D:  08/27/2004  T:  08/27/2004  Job:  [id number]   cc:   Nicki Guadalajara, M.D.  [phone] [address], [address]  [place], [place] [address]  Fax: [phone]   W. Simone Curia, M.D.  [address]  [place]  [place] [address]  Fax: [phone]

## 2010-08-25 DIAGNOSIS — I10 Essential (primary) hypertension: Secondary | ICD-10-CM

## 2010-08-25 HISTORY — DX: Essential (primary) hypertension: I10

## 2011-04-08 ENCOUNTER — Other Ambulatory Visit: Payer: Self-pay

## 2011-04-08 ENCOUNTER — Encounter (HOSPITAL_COMMUNITY): Payer: Self-pay

## 2011-04-08 ENCOUNTER — Emergency Department (HOSPITAL_COMMUNITY): Payer: Medicare Other

## 2011-04-08 ENCOUNTER — Inpatient Hospital Stay (HOSPITAL_COMMUNITY)
Admission: EM | Admit: 2011-04-08 | Discharge: 2011-04-10 | DRG: 194 | Disposition: A | Discharge status: Home / Self Care | Payer: Medicare Other | Attending: Internal Medicine | Admitting: Internal Medicine

## 2011-04-08 DIAGNOSIS — E860 Dehydration: Secondary | ICD-10-CM | POA: Diagnosis present

## 2011-04-08 DIAGNOSIS — I10 Essential (primary) hypertension: Secondary | ICD-10-CM | POA: Diagnosis present

## 2011-04-08 DIAGNOSIS — E039 Hypothyroidism, unspecified: Secondary | ICD-10-CM | POA: Diagnosis present

## 2011-04-08 DIAGNOSIS — N179 Acute kidney failure, unspecified: Secondary | ICD-10-CM | POA: Diagnosis present

## 2011-04-08 DIAGNOSIS — J189 Pneumonia, unspecified organism: Principal | ICD-10-CM | POA: Diagnosis present

## 2011-04-08 HISTORY — DX: Cardiac arrhythmia, unspecified: I49.9

## 2011-04-08 HISTORY — DX: Disorder of thyroid, unspecified: E07.9

## 2011-04-08 HISTORY — DX: Unspecified osteoarthritis, unspecified site: M19.90

## 2011-04-08 HISTORY — DX: Essential (primary) hypertension: I10

## 2011-04-08 LAB — DIFFERENTIAL
Eosinophils Absolute: 0 10*3/uL (ref 0.0–0.7)
Lymphs Abs: 0.7 10*3/uL (ref 0.7–4.0)
Monocytes Absolute: 0.6 10*3/uL (ref 0.1–1.0)
Monocytes Relative: 2 % — ABNORMAL LOW (ref 3–12)
Neutro Abs: 24.4 10*3/uL — ABNORMAL HIGH (ref 1.7–7.7)
Neutrophils Relative %: 95 % — ABNORMAL HIGH (ref 43–77)

## 2011-04-08 LAB — BASIC METABOLIC PANEL
BUN: 32 mg/dL — ABNORMAL HIGH (ref 6–23)
GFR calc non Af Amer: 45 mL/min — ABNORMAL LOW (ref >90)
Potassium: 3.6 mEq/L (ref 3.5–5.1)
Sodium: 134 mEq/L — ABNORMAL LOW (ref 135–145)

## 2011-04-08 LAB — CBC
HCT: 39 % (ref 36.0–46.0)
Hemoglobin: 13.3 g/dL (ref 12.0–15.0)
MCH: 30.3 pg (ref 26.0–34.0)
RBC: 4.39 MIL/uL (ref 3.87–5.11)

## 2011-04-08 MED ORDER — OXYBUTYNIN CHLORIDE ER 5 MG PO TB24
10.0000 mg | ORAL_TABLET | Freq: Every day | ORAL | Status: DC
  Administered 2011-04-08 – 2011-04-10 (×3): 10 mg via ORAL
  Filled 2011-04-08 (×4): qty 1
  Filled 2011-04-08: qty 2
  Filled 2011-04-08: qty 1

## 2011-04-08 MED ORDER — ACETAMINOPHEN 500 MG PO TABS
500.0000 mg | ORAL_TABLET | Freq: Four times a day (QID) | ORAL | Status: DC | PRN
  Administered 2011-04-09 – 2011-04-10 (×3): 500 mg via ORAL
  Filled 2011-04-08 (×3): qty 1

## 2011-04-08 MED ORDER — CITALOPRAM HYDROBROMIDE 20 MG PO TABS
ORAL_TABLET | ORAL | Status: AC
  Filled 2011-04-08: qty 2

## 2011-04-08 MED ORDER — ONDANSETRON HCL 4 MG/2ML IJ SOLN: 4.0000 mg | Freq: Four times a day (QID) | INTRAMUSCULAR | Status: DC | PRN

## 2011-04-08 MED ORDER — CITALOPRAM HYDROBROMIDE 20 MG PO TABS
40.0000 mg | ORAL_TABLET | Freq: Every day | ORAL | Status: DC
  Administered 2011-04-08 – 2011-04-10 (×3): 40 mg via ORAL
  Filled 2011-04-08 (×2): qty 2
  Filled 2011-04-08 (×3): qty 1

## 2011-04-08 MED ORDER — LEVOTHYROXINE SODIUM 75 MCG PO TABS
75.0000 ug | ORAL_TABLET | Freq: Every day | ORAL | Status: DC
  Administered 2011-04-09 – 2011-04-10 (×2): 75 ug via ORAL
  Filled 2011-04-08 (×2): qty 1

## 2011-04-08 MED ORDER — METOPROLOL SUCCINATE ER 25 MG PO TB24
12.5000 mg | ORAL_TABLET | Freq: Every day | ORAL | Status: DC
  Administered 2011-04-08 – 2011-04-10 (×3): 12.5 mg via ORAL
  Filled 2011-04-08 (×6): qty 1

## 2011-04-08 MED ORDER — ACETAMINOPHEN 325 MG PO TABS
650.0000 mg | ORAL_TABLET | Freq: Once | ORAL | Status: AC
  Administered 2011-04-08: 650 mg via ORAL
  Filled 2011-04-08: qty 2

## 2011-04-08 MED ORDER — SENNOSIDES-DOCUSATE SODIUM 8.6-50 MG PO TABS: 1.0000 | ORAL_TABLET | Freq: Every evening | ORAL | Status: DC | PRN

## 2011-04-08 MED ORDER — GUAIFENESIN-DM 100-10 MG/5ML PO SYRP: 5.0000 mL | ORAL_SOLUTION | ORAL | Status: DC | PRN

## 2011-04-08 MED ORDER — SODIUM CHLORIDE 0.9 % IV SOLN
INTRAVENOUS | Status: DC
  Administered 2011-04-08 – 2011-04-09 (×3): via INTRAVENOUS

## 2011-04-08 MED ORDER — MOXIFLOXACIN HCL IN NACL 400 MG/250ML IV SOLN
400.0000 mg | INTRAVENOUS | Status: DC
  Administered 2011-04-09: 400 mg via INTRAVENOUS
  Filled 2011-04-08 (×4): qty 250

## 2011-04-08 MED ORDER — TRAZODONE HCL 50 MG PO TABS
25.0000 mg | ORAL_TABLET | Freq: Every evening | ORAL | Status: DC | PRN
  Administered 2011-04-08: 25 mg via ORAL
  Filled 2011-04-08: qty 1

## 2011-04-08 MED ORDER — ALBUTEROL SULFATE (5 MG/ML) 0.5% IN NEBU: 2.5000 mg | INHALATION_SOLUTION | RESPIRATORY_TRACT | Status: DC | PRN

## 2011-04-08 MED ORDER — MOXIFLOXACIN HCL IN NACL 400 MG/250ML IV SOLN
400.0000 mg | Freq: Once | INTRAVENOUS | Status: AC
  Administered 2011-04-08: 400 mg via INTRAVENOUS
  Filled 2011-04-08: qty 250

## 2011-04-08 MED ORDER — DOCUSATE SODIUM 100 MG PO CAPS
100.0000 mg | ORAL_CAPSULE | Freq: Two times a day (BID) | ORAL | Status: DC
  Administered 2011-04-08 – 2011-04-10 (×4): 100 mg via ORAL
  Filled 2011-04-08 (×4): qty 1

## 2011-04-08 MED ORDER — ROSUVASTATIN CALCIUM 5 MG PO TABS
10.0000 mg | ORAL_TABLET | Freq: Every day | ORAL | Status: DC
  Administered 2011-04-08 – 2011-04-09 (×2): 10 mg via ORAL
  Filled 2011-04-08 (×2): qty 2
  Filled 2011-04-08 (×2): qty 1

## 2011-04-08 MED ORDER — ENOXAPARIN SODIUM 80 MG/0.8ML ~~LOC~~ SOLN
40.0000 mg | SUBCUTANEOUS | Status: DC
  Administered 2011-04-08 – 2011-04-09 (×2): 40 mg via SUBCUTANEOUS
  Filled 2011-04-08 (×2): qty 0.8

## 2011-04-08 MED ORDER — PANTOPRAZOLE SODIUM 40 MG PO TBEC
40.0000 mg | DELAYED_RELEASE_TABLET | Freq: Every day | ORAL | Status: DC
  Administered 2011-04-09: 40 mg via ORAL

## 2011-04-08 MED ORDER — PANTOPRAZOLE SODIUM 40 MG PO TBEC
80.0000 mg | DELAYED_RELEASE_TABLET | Freq: Every day | ORAL | Status: DC
  Administered 2011-04-08 – 2011-04-10 (×3): 80 mg via ORAL
  Filled 2011-04-08 (×2): qty 1
  Filled 2011-04-08 (×2): qty 2

## 2011-04-08 MED ORDER — METOPROLOL SUCCINATE ER 25 MG PO TB24
ORAL_TABLET | ORAL | Status: AC
  Filled 2011-04-08: qty 1

## 2011-04-08 MED ORDER — OXYBUTYNIN CHLORIDE ER 5 MG PO TB24
ORAL_TABLET | ORAL | Status: AC
  Filled 2011-04-08: qty 2

## 2011-04-08 MED ORDER — ALUM & MAG HYDROXIDE-SIMETH 200-200-20 MG/5ML PO SUSP: 30.0000 mL | Freq: Four times a day (QID) | ORAL | Status: DC | PRN

## 2011-04-08 MED ORDER — ONDANSETRON HCL 4 MG PO TABS: 4.0000 mg | ORAL_TABLET | Freq: Four times a day (QID) | ORAL | Status: DC | PRN

## 2011-04-08 NOTE — ED Notes (Signed)
Per ems, pt has felt bad since Monday.  Pt reports n/v, fever, decreased appetite, fatigue.  Pt reports cough, accompanied by " a little chest discomfort".  Pt reports rt shoulder pain that "comes and goes" for the past few days as well.

## 2011-04-08 NOTE — H&P (Signed)
Hospital Admission Note Date: 04/08/2011  Patient name: Denise Pugh Medical record number: [id number] Date of birth: 1928-07-18 Age: 78 y.o. Gender: female PCP: [facility]  Attending physician: Christiane Ha, MD  Chief Complaint: Cough  History of Present Illness:  Denise Pugh is an 78 y.o. female who presents with a several day history of cough, shortness of breath, nausea, vomiting, weakness, fevers and chills. She's had no diarrhea. She's had no sick contacts. She's had no myalgias. In the emergency room, she was found to have pneumonia on chest x-ray and leukocytosis. She has not taken her medications for several days. Her nausea has resolved and she is currently hungry. She is an active woman who lives at home with her husband.  Past Medical History  Diagnosis Date  . Hypertension   . Thyroid disease   . Arthritis   . Irregular heart beat     Meds: Prescriptions prior to admission  Medication Sig Dispense Refill  . acetaminophen (TYLENOL) 500 MG tablet Take 500 mg by mouth every 6 (six) hours as needed. For fever      . Calcium Carb-Cholecalciferol (CALCIUM 1000 + D PO) Take 1 tablet by mouth daily.      . celecoxib (CELEBREX) 100 MG capsule Take 100-200 mg by mouth as needed. For pain      . Cholecalciferol (VITAMIN D) 2000 UNITS tablet Take 2,000 Units by mouth daily.      . citalopram (CELEXA) 40 MG tablet Take 40 mg by mouth daily.      Marland Kitchen dexlansoprazole (DEXILANT) 60 MG capsule Take 60 mg by mouth 2 (two) times daily.      Marland Kitchen docusate sodium (COLACE) 100 MG capsule Take 100 mg by mouth 2 (two) times daily.      Marland Kitchen levothyroxine (SYNTHROID, LEVOTHROID) 75 MCG tablet Take 75 mcg by mouth daily.      Marland Kitchen losartan-hydrochlorothiazide (HYZAAR) 100-12.5 MG per tablet Take 1 tablet by mouth daily.      . metoprolol succinate (TOPROL-XL) 25 MG 24 hr tablet Take 12.5 mg by mouth daily.      . Multiple Vitamin (MULITIVITAMIN WITH MINERALS) TABS Take 1 tablet by mouth  daily.      Marland Kitchen oxybutynin (DITROPAN-XL) 10 MG 24 hr tablet Take 10 mg by mouth daily.      . rosuvastatin (CRESTOR) 10 MG tablet Take 10 mg by mouth at bedtime.      . verapamil (CALAN-SR) 240 MG CR tablet Take 240 mg by mouth daily.        Allergies: Penicillins and Tetanus toxoid History   Social History  . Marital Status: Married    Spouse Name: N/A    Number of Children: N/A  . Years of Education: N/A   Occupational History  . Not on file.   Social History Main Topics  . Smoking status: Never Smoker   . Smokeless tobacco: Not on file  . Alcohol Use: No  . Drug Use: No  . Sexually Active:    Other Topics Concern  . Not on file   Social History Narrative  . No narrative on file   History reviewed. No pertinent family history. Past Surgical History  Procedure Date  . Knee arthroscopy     Review of Systems: Systems reviewed and as per HPI, otherwise negative.  Physical Exam: Blood pressure 108/45, pulse 76, temperature 99.4 F (37.4 C), temperature source Oral, resp. rate 16, SpO2 92.00%.  BP 108/45  Pulse 76  Temp(Src) 99.4 F (37.4 C) (Oral)  Resp 16  SpO2 92%  General Appearance:    Alert, cooperative, no distress, appears stated age. Nontoxic. Able to ambulate from the stretcher to her hospital bed.   Head:    Normocephalic, without obvious abnormality, atraumatic  Eyes:    PERRL, conjunctiva/corneas clear, EOM's intact, fundi    benign, both eyes     Nose:   Nares normal, septum midline, mucosa normal, no drainage    or sinus tenderness  Throat:  slightly dry mucous membranes. No thrush. Oropharynx without erythema or exudate   Neck:   Supple, symmetrical, trachea midline, no adenopathy;    thyroid:  no enlargement/tenderness/nodules; no carotid   bruit or JVD  Back:     Symmetric, no curvature, ROM normal, no CVA tenderness  Lungs:     Clear to auscultation bilaterally, respirations unlabored  Chest Wall:    No tenderness or deformity   Heart:     Regular rate and rhythm, S1 and S2 normal, no murmur, rub   or gallop  Breast Exam:   deferred   Abdomen:     Soft, non-tender, bowel sounds active all four quadrants,    no masses, no organomegaly  Genitalia:   deferred   Rectal:   deferred   Extremities:   Extremities normal, atraumatic, no cyanosis or edema  Pulses:   2+ and symmetric all extremities  Skin:   Skin color, texture, turgor normal, no rashes or lesions  Lymph nodes:   Cervical, supraclavicular, and axillary nodes normal  Neurologic:   CNII-XII intact, normal strength, sensation and reflexes    throughout    Psychiatric: Normal affect. Calm and cooperative.  Lab results: Basic Metabolic Panel:  Basename 04/08/11 1427  NA 134*  K 3.6  CL 95*  CO2 30  GLUCOSE 106*  BUN 32*  CREATININE 1.12*  CALCIUM 9.0  MG --  PHOS --   Liver Function Tests: No results found for this basename: AST:2,ALT:2,ALKPHOS:2,BILITOT:2,PROT:2,ALBUMIN:2 in the last 72 hours No results found for this basename: LIPASE:2,AMYLASE:2 in the last 72 hours No results found for this basename: AMMONIA:2 in the last 72 hours CBC:  Basename 04/08/11 1427  WBC 25.7*  NEUTROABS 24.4*  HGB 13.3  HCT 39.0  MCV 88.8  PLT 165    Imaging results:  Dg Chest 2 View  04/08/2011  *RADIOLOGY REPORT*  Clinical Data: Cough and vomiting.  CHEST - 2 VIEW  Comparison: PA and lateral chest 06/12/2007.  Findings: The patient has dense right upper lobe airspace disease consistent with pneumonia.  There is also airspace opacity in the left base.  No pneumothorax or effusion.  IMPRESSION: Right upper and left lower lobe pneumonia.  Original Report Authenticated By: Bernadene Bell. Maricela Curet, M.D.    Assessment & Plan: Principal Problem:  *CAP (community acquired pneumonia) Active Problems:  HYPERTENSION  Hypothyroidism  Patient has been given Avelox which I will continue. She reports that she takes metoprolol and diltiazem for blood pressure and "irregular  heartbeat. According to old records she has a history of SVT. Her blood pressure is borderline low. I will continue metoprolol and diltiazem but hold her other antihypertensives. She appears dehydrated clinically and by BUN and creatinine. I will give her IV fluids. Supportive care.  Stalin Gruenberg L 04/08/2011, 7:12 PM

## 2011-04-08 NOTE — ED Provider Notes (Signed)
History     CSN: [id number]  Arrival date & time 04/08/11  1340   First MD Initiated Contact with Patient 04/08/11 1509      Chief Complaint  Patient presents with  . Fever  . Fatigue  . Cough     Patient is a 78 y.o. female presenting with fever and cough. The history is provided by the patient.  Fever Primary symptoms of the febrile illness include fever, fatigue, cough, shortness of breath, nausea, vomiting and myalgias. Primary symptoms do not include diarrhea. The current episode started 2 days ago. This is a new problem. The problem has been gradually worsening.  Cough Associated symptoms include myalgias and shortness of breath.  Her symptoms are worsening Nothing improves her symptoms Nothing relieves her symptoms Reports mild right shoulder discomfort No abd pain No back pain No dysuria She took APAP over 8 hrs ago, but still with fever.  Past Medical History  Diagnosis Date  . Hypertension   . Thyroid disease   . Arthritis   . Irregular heart beat     Past Surgical History  Procedure Date  . Knee arthroscopy     No family history on file.  History  Substance Use Topics  . Smoking status: Never Smoker   . Smokeless tobacco: Not on file  . Alcohol Use: No    OB History    Grav Para Term Preterm Abortions TAB SAB Ect Mult Living                  Review of Systems  Constitutional: Positive for fever and fatigue.  Respiratory: Positive for cough and shortness of breath.   Gastrointestinal: Positive for nausea and vomiting. Negative for diarrhea.  Musculoskeletal: Positive for myalgias.  All other systems reviewed and are negative.    Allergies  Penicillins and Tetanus toxoid  Home Medications   Current Outpatient Rx  Name Route Sig Dispense Refill  . ACETAMINOPHEN 500 MG PO TABS Oral Take 500 mg by mouth every 6 (six) hours as needed. For fever    . CALCIUM 1000 + D PO Oral Take 1 tablet by mouth daily.    . CELECOXIB 100 MG PO CAPS  Oral Take 100-200 mg by mouth as needed. For pain    . VITAMIN D 2000 UNITS PO TABS Oral Take 2,000 Units by mouth daily.    Marland Kitchen CITALOPRAM HYDROBROMIDE 40 MG PO TABS Oral Take 40 mg by mouth daily.    . DEXLANSOPRAZOLE 60 MG PO CPDR Oral Take 60 mg by mouth 2 (two) times daily.    Marland Kitchen DOCUSATE SODIUM 100 MG PO CAPS Oral Take 100 mg by mouth 2 (two) times daily.    Marland Kitchen LEVOTHYROXINE SODIUM 75 MCG PO TABS Oral Take 75 mcg by mouth daily.    Marland Kitchen LOSARTAN POTASSIUM-HCTZ 100-12.5 MG PO TABS Oral Take 1 tablet by mouth daily.    Marland Kitchen METOPROLOL SUCCINATE ER 25 MG PO TB24 Oral Take 12.5 mg by mouth daily.    . ADULT MULTIVITAMIN W/MINERALS CH Oral Take 1 tablet by mouth daily.    . OXYBUTYNIN CHLORIDE ER 10 MG PO TB24 Oral Take 10 mg by mouth daily.    Marland Kitchen ROSUVASTATIN CALCIUM 10 MG PO TABS Oral Take 10 mg by mouth at bedtime.    Marland Kitchen VERAPAMIL HCL 240 MG PO TBCR Oral Take 240 mg by mouth daily.      BP 140/43  Pulse 81  Temp(Src) 100.5 F (38.1 C) (Oral)  Resp  18  SpO2 95%  Physical Exam CONSTITUTIONAL: Well developed/well nourished HEAD AND FACE: Normocephalic/atraumatic EYES: EOMI/PERRL ENMT: Mucous membranes dry NECK: supple no meningeal signs SPINE:entire spine nontender CV: S1/S2 noted, no murmurs/rubs/gallops noted LUNGS: crackles right base, no tachypnea noted ABDOMEN: soft, nontender, no rebound or guarding GU:no cva tenderness NEURO: Pt is awake/alert, moves all extremitiesx4 EXTREMITIES: pulses normal, full ROM SKIN: warm, color normal PSYCH: no abnormalities of mood noted  ED Course  Procedures   Labs Reviewed  BASIC METABOLIC PANEL - Abnormal; Notable for the following:    Sodium 134 (*)    Chloride 95 (*)    Glucose, Bld 106 (*)    BUN 32 (*)    Creatinine, Ser 1.12 (*)    GFR calc non Af Amer 45 (*)    GFR calc Af Amer 52 (*)    All other components within normal limits  CBC - Abnormal; Notable for the following:    WBC 25.7 (*)    All other components within normal  limits  DIFFERENTIAL - Abnormal; Notable for the following:    Neutrophils Relative 95 (*)    Neutro Abs 24.4 (*)    Lymphocytes Relative 3 (*)    Monocytes Relative 2 (*)    All other components within normal limits      MDM  Nursing notes reviewed and considered in documentation All labs/vitals reviewed and considered xrays reviewed and considered  4:48 PM D/w dr Lendell Caprice, will admit      Date: 04/08/2011  Rate: 79  Rhythm: normal sinus rhythm  QRS Axis: normal  Intervals: normal  ST/T Wave abnormalities: inverted T waves  Conduction Disutrbances:none  Narrative Interpretation:   Old EKG Reviewed: none available    Joya Gaskins, MD 04/08/11 1649

## 2011-04-08 NOTE — ED Notes (Signed)
Gave report to [employer]

## 2011-04-08 NOTE — ED Notes (Signed)
Called Debra RN to give report, she to call me back.

## 2011-04-08 NOTE — ED Notes (Signed)
Pt to xray

## 2011-04-09 LAB — BASIC METABOLIC PANEL
BUN: 27 mg/dL — ABNORMAL HIGH (ref 6–23)
CO2: 27 mEq/L (ref 19–32)
Calcium: 8.5 mg/dL (ref 8.4–10.5)
Chloride: 100 mEq/L (ref 96–112)
Creatinine, Ser: 0.95 mg/dL (ref 0.50–1.10)
Glucose, Bld: 90 mg/dL (ref 70–99)

## 2011-04-09 LAB — CBC
HCT: 36.7 % (ref 36.0–46.0)
MCH: 29.6 pg (ref 26.0–34.0)
MCV: 88.2 fL (ref 78.0–100.0)
RBC: 4.16 MIL/uL (ref 3.87–5.11)
WBC: 21.6 10*3/uL — ABNORMAL HIGH (ref 4.0–10.5)

## 2011-04-09 MED ORDER — SODIUM CHLORIDE 0.9 % IJ SOLN
INTRAMUSCULAR | Status: AC
  Administered 2011-04-09: 1000 mL
  Filled 2011-04-09: qty 3

## 2011-04-09 MED ORDER — POTASSIUM CHLORIDE CRYS ER 20 MEQ PO TBCR
40.0000 meq | EXTENDED_RELEASE_TABLET | Freq: Once | ORAL | Status: AC
  Administered 2011-04-09: 40 meq via ORAL
  Filled 2011-04-09: qty 2

## 2011-04-09 NOTE — Progress Notes (Signed)
Subjective: This lady was admitted with bilateral community-acquired pneumonia, more on the right than the left. Her only symptom appears to have been fever and feeling fatigued. She does not really have a productive cough. She does not have influenza. She is not dyspneic.           Physical Exam: Blood pressure 123/74, pulse 61, temperature 100.1 F (37.8 C), temperature source Oral, resp. rate 18, height 5\' 3"  (1.6 m), weight 84.5 kg (186 lb 4.6 oz), SpO2 94.00%. She looks systemically well. She is not toxic or septic. She has not breathless at rest and does not have increased work of breathing. There is no peripheral central cyanosis. Lung fields show some crackles in the right upper and mid zones. There is no bronchial breathing. There are no wheezes. Heart sounds are present and appear to be in sinus rhythm and not tachycardic at rest. She is alert and orientated and in good spirits.   Investigations:     Basic Metabolic Panel:  Basename 04/09/11 0527 04/08/11 1427  NA 134* 134*  K 3.2* 3.6  CL 100 95*  CO2 27 30  GLUCOSE 90 106*  BUN 27* 32*  CREATININE 0.95 1.12*  CALCIUM 8.5 9.0  MG -- --  PHOS -- --      CBC:  Basename 04/09/11 0527 04/08/11 1427  WBC 21.6* 25.7*  NEUTROABS -- 24.4*  HGB 12.3 13.3  HCT 36.7 39.0  MCV 88.2 88.8  PLT 167 165    Dg Chest 2 View  04/08/2011  *RADIOLOGY REPORT*  Clinical Data: Cough and vomiting.  CHEST - 2 VIEW  Comparison: PA and lateral chest 06/12/2007.  Findings: The patient has dense right upper lobe airspace disease consistent with pneumonia.  There is also airspace opacity in the left base.  No pneumothorax or effusion.  IMPRESSION: Right upper and left lower lobe pneumonia.  Original Report Authenticated By: Bernadene Bell. Maricela Curet, M.D.      Medications: I have reviewed the patient's current medications.  Impression: 1. Bilateral community-acquired pneumonia. 2. Hypertension. 3. Hypothyroidism. 4.  Hypokalemia. 5. Acute renal failure, improving.     Plan: 1. Continue with intravenous fluids at a reduced rate. 2. Replete potassium. 3. Continue intravenous antibiotics. 4. Possible discharge home tomorrow depending on clinical condition.     LOS: 1 day   Wilson Singer Pager [phone]  04/09/2011, 11:44 AM

## 2011-04-09 NOTE — Progress Notes (Signed)
UR Chart Review Completed  

## 2011-04-10 LAB — COMPREHENSIVE METABOLIC PANEL
AST: 15 U/L (ref 0–37)
BUN: 17 mg/dL (ref 6–23)
CO2: 27 mEq/L (ref 19–32)
Chloride: 105 mEq/L (ref 96–112)
Creatinine, Ser: 0.85 mg/dL (ref 0.50–1.10)
GFR calc Af Amer: 73 mL/min — ABNORMAL LOW (ref >90)
GFR calc non Af Amer: 63 mL/min — ABNORMAL LOW (ref >90)
Glucose, Bld: 93 mg/dL (ref 70–99)
Total Bilirubin: 0.3 mg/dL (ref 0.3–1.2)

## 2011-04-10 LAB — CBC
HCT: 35.8 % — ABNORMAL LOW (ref 36.0–46.0)
Hemoglobin: 11.8 g/dL — ABNORMAL LOW (ref 12.0–15.0)
MCH: 29.6 pg (ref 26.0–34.0)
MCV: 89.7 fL (ref 78.0–100.0)
Platelets: 168 10*3/uL (ref 150–400)
RBC: 3.99 MIL/uL (ref 3.87–5.11)
WBC: 10.8 10*3/uL — ABNORMAL HIGH (ref 4.0–10.5)

## 2011-04-10 MED ORDER — GUAIFENESIN-CODEINE 100-10 MG/5ML PO SYRP: 5.0000 mL | ORAL_SOLUTION | Freq: Three times a day (TID) | ORAL | Status: AC | PRN

## 2011-04-10 MED ORDER — MOXIFLOXACIN HCL 400 MG PO TABS: 400.0000 mg | ORAL_TABLET | Freq: Every day | ORAL | Status: AC

## 2011-04-10 NOTE — Progress Notes (Signed)
Patient discharged home via family; Pt given and explained discharge instructions, prescriptions, carenotes; stated understanding and denied questions; pts IV removed without problems; pt stable at time of discharge   

## 2011-04-10 NOTE — Discharge Summary (Signed)
Physician Discharge Summary  Patient ID: Denise Pugh MRN: [id number] DOB/AGE: November 24, 1928 78 y.o.  Admit date: 04/08/2011 Discharge date: 04/10/2011    Discharge Diagnoses:  1. Bilateral community-acquired pneumonia, improving. 2. Hypertension. 3. Hypothyroidism. 4. Dehydration/acute renal failure, resolved.   Current Discharge Medication List    START taking these medications   Details  guaiFENesin-codeine (ROBITUSSIN AC) 100-10 MG/5ML syrup Take 5 mLs by mouth 3 (three) times daily as needed for cough. Qty: 120 mL, Refills: 0    moxifloxacin (AVELOX) 400 MG tablet Take 1 tablet (400 mg total) by mouth daily. Qty: 5 tablet, Refills: 0      CONTINUE these medications which have NOT CHANGED   Details  acetaminophen (TYLENOL) 500 MG tablet Take 500 mg by mouth every 6 (six) hours as needed. For fever    Calcium Carb-Cholecalciferol (CALCIUM 1000 + D PO) Take 1 tablet by mouth daily.    celecoxib (CELEBREX) 100 MG capsule Take 100-200 mg by mouth as needed. For pain    Cholecalciferol (VITAMIN D) 2000 UNITS tablet Take 2,000 Units by mouth daily.    citalopram (CELEXA) 40 MG tablet Take 40 mg by mouth daily.    dexlansoprazole (DEXILANT) 60 MG capsule Take 60 mg by mouth 2 (two) times daily.    docusate sodium (COLACE) 100 MG capsule Take 100 mg by mouth 2 (two) times daily.    levothyroxine (SYNTHROID, LEVOTHROID) 75 MCG tablet Take 75 mcg by mouth daily.    losartan-hydrochlorothiazide (HYZAAR) 100-12.5 MG per tablet Take 1 tablet by mouth daily.    metoprolol succinate (TOPROL-XL) 25 MG 24 hr tablet Take 12.5 mg by mouth daily.    Multiple Vitamin (MULITIVITAMIN WITH MINERALS) TABS Take 1 tablet by mouth daily.    oxybutynin (DITROPAN-XL) 10 MG 24 hr tablet Take 10 mg by mouth daily.    rosuvastatin (CRESTOR) 10 MG tablet Take 10 mg by mouth at bedtime.    verapamil (CALAN-SR) 240 MG CR tablet Take 240 mg by mouth daily.        Discharged Condition: Improved  and stable.    Consults: None.  Significant Diagnostic Studies: Dg Chest 2 View  04/08/2011  *RADIOLOGY REPORT*  Clinical Data: Cough and vomiting.  CHEST - 2 VIEW  Comparison: PA and lateral chest 06/12/2007.  Findings: The patient has dense right upper lobe airspace disease consistent with pneumonia.  There is also airspace opacity in the left base.  No pneumothorax or effusion.  IMPRESSION: Right upper and left lower lobe pneumonia.  Original Report Authenticated By: Bernadene Bell. Maricela Curet, M.D.    Lab Results: Basic Metabolic Panel:  Basename 04/10/11 0529 04/09/11 0527  NA 138 134*  K 3.4* 3.2*  CL 105 100  CO2 27 27  GLUCOSE 93 90  BUN 17 27*  CREATININE 0.85 0.95  CALCIUM 8.9 8.5  MG -- --  PHOS -- --   Liver Function Tests:  [facility] 04/10/11 0529  AST 15  ALT 12  ALKPHOS 79  BILITOT 0.3  PROT 6.3  ALBUMIN 2.5*     CBC:  Basename 04/10/11 0529 04/09/11 0527 04/08/11 1427  WBC 10.8* 21.6* --  NEUTROABS -- -- 24.4*  HGB 11.8* 12.3 --  HCT 35.8* 36.7 --  MCV 89.7 88.2 --  PLT 168 167 --       Hospital Course: This for a pleasant 78 year old lady was admitted with symptoms of cough, dyspnea, fever and chills. Please see initial history and physical examination. She was found to have  pneumonia on chest x-ray in the right upper lobe and left lower lobe. She was therefore admitted to the hospital and started on intravenous Avelox. She did well very quickly and has been afebrile for the last 24-36 hours. She does feel somewhat weak. She has a cough which is largely nonproductive at this point associated with pleuritic chest pain. However this pain is not so severe to be unbearable. She is trying to mobilize more in the last 24 hours and has done well. She did require oxygen when she presented to the hospital but now is saturating 92-94% on room air.  Discharge Exam: Blood pressure 142/81, pulse 56, temperature 97.8 F (36.6 C), temperature source Oral, resp. rate  20, height 5\' 3"  (1.6 m), weight 84.5 kg (186 lb 4.6 oz), SpO2 97.00%. She looks systemically well. She is not toxic or septic. There is no increased work of breathing. There is no peripheral central cyanosis. Heart sounds are present and normal. Lung fields actually appear to be clinically clear in the posterior lungs. She is alert and orientated.  Disposition: Home. She has been given a further 5 day course of oral Avelox. She is seeking a new primary care physician and have recommended Dr. Margo Aye. She will need a repeat chest x-ray in 4-6 weeks' time to ensure clearing of the pneumonia radiologically.  Discharge Orders    Future Orders Please Complete By Expires   Diet - low sodium heart healthy      Increase activity slowly         Follow-up Information    Follow up with [facility], MD. Schedule an appointment as soon as possible for a visit in 4 weeks. (new patient appt)    Contact information:   [address] [place] [address] [phone]          SignedWilson Singer Pager [phone]  04/10/2011, 11:24 AM

## 2011-08-06 DIAGNOSIS — E785 Hyperlipidemia, unspecified: Secondary | ICD-10-CM

## 2011-08-06 HISTORY — DX: Hyperlipidemia, unspecified: E78.5

## 2011-10-20 ENCOUNTER — Other Ambulatory Visit (HOSPITAL_COMMUNITY): Payer: Self-pay | Admitting: Internal Medicine

## 2011-10-20 DIAGNOSIS — M545 Low back pain: Secondary | ICD-10-CM

## 2011-10-21 ENCOUNTER — Ambulatory Visit (HOSPITAL_COMMUNITY)
Admission: RE | Admit: 2011-10-21 | Discharge: 2011-10-21 | Disposition: A | Discharge status: Home / Self Care | Payer: Medicare Other | Source: Ambulatory Visit | Attending: Internal Medicine | Admitting: Internal Medicine

## 2011-10-21 DIAGNOSIS — M545 Low back pain, unspecified: Secondary | ICD-10-CM | POA: Insufficient documentation

## 2011-10-21 DIAGNOSIS — M51379 Other intervertebral disc degeneration, lumbosacral region without mention of lumbar back pain or lower extremity pain: Secondary | ICD-10-CM | POA: Insufficient documentation

## 2011-10-21 DIAGNOSIS — M5137 Other intervertebral disc degeneration, lumbosacral region: Secondary | ICD-10-CM | POA: Insufficient documentation

## 2011-12-03 ENCOUNTER — Other Ambulatory Visit: Payer: Self-pay | Admitting: Neurosurgery

## 2011-12-03 DIAGNOSIS — R2681 Unsteadiness on feet: Secondary | ICD-10-CM

## 2011-12-03 DIAGNOSIS — M546 Pain in thoracic spine: Secondary | ICD-10-CM

## 2012-08-02 ENCOUNTER — Other Ambulatory Visit: Payer: Self-pay | Admitting: *Deleted

## 2012-08-02 MED ORDER — ROSUVASTATIN CALCIUM 10 MG PO TABS: 10.0000 mg | ORAL_TABLET | Freq: Every day | ORAL | Status: DC

## 2012-08-02 NOTE — Telephone Encounter (Signed)
Refill for crestor sent to [facility]

## 2012-08-08 ENCOUNTER — Telehealth: Payer: Self-pay | Admitting: Cardiovascular Disease

## 2012-08-08 NOTE — Telephone Encounter (Signed)
Pt is a [place] pt-she is wearing a monitor and she was asked to come over here,but she couldn't-Please call!

## 2012-08-09 ENCOUNTER — Encounter: Payer: Self-pay | Admitting: *Deleted

## 2012-08-09 NOTE — Telephone Encounter (Signed)
Returned call.  Pt stated she was called by someone in the [place] office yesterday morning and told to come to [facility] because her monitor was doing something over the weekend.  Stated she did not have a way to get here because she is the caregiver for her husband and just couldn't make it.  Pt she was in a lot of stress this weekend r/t her husband and his condition.  Pt stated she is supposed to take the monitor off on Friday and wants to see Dr. Tresa Endo when she comes in.  Pt informed Dr. Pierre Bali, CMA will be notified for further instructions as her records are in the [place] office and they are there today.  Pt advised to wait for a call back regarding appointment.  Pt verbalized understanding and agreed w/ plan.

## 2012-08-09 NOTE — Telephone Encounter (Signed)
Spoke w/ W. Waddell, CMA and advised pt be scheduled to see Dr. Tresa Endo this week if openings r/t abnormal ekg.  Call to pt and pt informed.  Pt verbalized understanding and agreed w/ plan.  Appt scheduled for tomorrow (5.21.14) at 11:45am.

## 2012-08-10 ENCOUNTER — Encounter: Payer: Self-pay | Admitting: Cardiovascular Disease

## 2012-08-10 ENCOUNTER — Ambulatory Visit (INDEPENDENT_AMBULATORY_CARE_PROVIDER_SITE_OTHER): Payer: Medicare Other | Admitting: Cardiovascular Disease

## 2012-08-10 VITALS — BP 118/60 | HR 49 | Ht 62.0 in | Wt 188.0 lb

## 2012-08-10 DIAGNOSIS — I4892 Unspecified atrial flutter: Secondary | ICD-10-CM

## 2012-08-10 LAB — COMPREHENSIVE METABOLIC PANEL
ALT: 16 U/L (ref 0–35)
AST: 17 U/L (ref 0–37)
Albumin: 4.1 g/dL (ref 3.5–5.2)
Alkaline Phosphatase: 85 U/L (ref 39–117)
BUN: 37 mg/dL — ABNORMAL HIGH (ref 6–23)
Calcium: 9.2 mg/dL (ref 8.4–10.5)
Chloride: 98 mEq/L (ref 96–112)
Creat: 1.38 mg/dL — ABNORMAL HIGH (ref 0.50–1.10)
Glucose, Bld: 86 mg/dL (ref 70–99)
Potassium: 3.9 mEq/L (ref 3.5–5.3)
Sodium: 136 mEq/L (ref 135–145)
Total Bilirubin: 0.4 mg/dL (ref 0.3–1.2)
Total Protein: 6.8 g/dL (ref 6.0–8.3)

## 2012-08-10 NOTE — Progress Notes (Signed)
Patient ID: Denise Pugh, female   DOB: 1928/10/09, 79 y.o.   MRN: [id number]  HPI: Denise Pugh, is a 79 y.o. female who presents to the office today in followup of her recent increased tachycardia palpitations. Denise Pugh is an 79 year old female who has a history of supraventricular tachycardia and moderate left ventricular hypertrophy with documented "spade-like ventricle. "She has previously documented effuse T-wave abnormalities. The stress test in May 2013 breast attenuation artifact with a post stress ejection fraction of 70% without evidence for scar or ischemia. I had seen her in March 2014 at which time she did have some episodes of this leg swelling I recommended the addition of torsemide 20 mg daily to her medical regimen recommended she discontinue her Voltaren. Subsequent laboratory on 07/15/2012 showed a creatinine of 1.72. She was advised to decrease her torsemide to every other day to apparently she was seen in the office last week I. lower and old on 08/01/2012. She had experienced current episodes of palpitations. He was had been started on a cardiac monitor which did reveal episodes of supraventricular tachycardia up to 185 beats per minute her metoprolol dose had been increased from 12.5-25 mg daily. She admits to having significant anxiety on the home front. Her husband has had some difficulty recently and in addition her water pump malfunction creating significant difficulties. Apparently continued use of the CardioNet monitor on her increased dose of Toprol-XL 25 mg still reveals bursts of tachycardia but palpitations and on 08/05/2012 at 12:30 she again had an episode of atrial tachycardia/A. flutter rate at 189 beats per minute. She admits that this was a burst very stressful day. On day on May 17 and had an episode of tachycardia palpitations and did also have episodes of bradycardia or if some of her spells suggested SVT at 150 sig in addition to episode of accelerated  idioventricular rhythm when she was sinus bradycardic. She has not filled any of these symptoms since 08/06/2012 she presents today for evaluation.  Past Medical History  Diagnosis Date  . Thyroid disease   . Arthritis   . Irregular heart beat   . Arrhythmia     HX of SVT. CARDIONET MONITOR 07/15/12 TO 08/13/12  . Sleep apnea     SLEEP STUDY-[facility]  . Hypertension 08/25/10    ECHO-EF 60-65%  . Hyperlipidemia 08/06/11    lexiscan myoview-normal. Due to severe attenuation the study specificity and sensitivty are deminished.    Past Surgical History  Procedure Laterality Date  . Knee arthroscopy    . Cardiac catheterization  07/06/01    spade-like configuration    Allergies  Allergen Reactions  . Penicillins Other (See Comments)    tingling  . Tetanus Toxoid Swelling    Current Outpatient Prescriptions  Medication Sig Dispense Refill  . acetaminophen (TYLENOL) 500 MG tablet Take 500 mg by mouth every 6 (six) hours as needed. For fever      . Cholecalciferol (VITAMIN D) 2000 UNITS tablet Take 2,000 Units by mouth daily.      . citalopram (CELEXA) 40 MG tablet Take 40 mg by mouth daily.      Marland Kitchen dexlansoprazole (DEXILANT) 60 MG capsule Take 60 mg by mouth 2 (two) times daily.      . diclofenac (VOLTAREN) 75 MG EC tablet Take 75 mg by mouth 2 (two) times daily.      Marland Kitchen docusate sodium (COLACE) 100 MG capsule Take 100 mg by mouth 2 (two) times daily.      Marland Kitchen  levothyroxine (SYNTHROID, LEVOTHROID) 75 MCG tablet Take 75 mcg by mouth daily.      Marland Kitchen losartan (COZAAR) 100 MG tablet Take 100 mg by mouth daily.      . metoprolol succinate (TOPROL-XL) 25 MG 24 hr tablet Take 25 mg by mouth 2 (two) times daily.       Marland Kitchen oxybutynin (DITROPAN-XL) 10 MG 24 hr tablet Take 10 mg by mouth daily.      . rosuvastatin (CRESTOR) 10 MG tablet Take 1 tablet (10 mg total) by mouth at bedtime. 1 tablet at bedtime or as directed  90 tablet  3  . torsemide (DEMADEX) 20 MG tablet Take 20 mg  by mouth every other day.      . traMADol (ULTRAM) 50 MG tablet Take 50 mg by mouth.      . verapamil (CALAN-SR) 240 MG CR tablet Take 240 mg by mouth daily.      . Calcium Carb-Cholecalciferol (CALCIUM 1000 + D PO) Take 1 tablet by mouth daily.      . celecoxib (CELEBREX) 100 MG capsule Take 100-200 mg by mouth as needed. For pain      . losartan-hydrochlorothiazide (HYZAAR) 100-12.5 MG per tablet Take 1 tablet by mouth daily.      . Multiple Vitamin (MULITIVITAMIN WITH MINERALS) TABS Take 1 tablet by mouth daily.       No current facility-administered medications for this visit.   Social history is notable in that she is now. Her husband did suffer a CVA and has been having more difficulty at home. She has one child, 2 step sons, one grandchild and 2 great-grandchildren. There is no tobacco or alcohol use. She does admit to frequent anxiety.  ROV is negative for fever chills night sweats. When she felt the episodes of palpitations she admits that these were bad days with reference to stress she denied any associated chest pressure. She did note some mild shortness of breath. She denies rash. She denies indigestion nausea vomiting or diarrhea she denies any tremor she denies rash. Other system review is negative.  PE BP 118/60  Pulse 49  Ht 5\' 2"  (1.575 m)  Wt 188 lb (85.276 kg)  BMI 34.38 kg/m2  General: Alert, oriented, no distress.  HEENT: Normocephalic, atraumatic. Pupils round and reactive; sclera anicteric; Mouth/Parynx benign; Mallinpatti scale 2 Neck: No JVD, no carotid briuts Lungs: clear to ausculatation and percussion; no wheezing or rales Heart: RRR, s1 s2 normal 2/6 SEM Abdomen: soft, nontender; no hepatosplenomehaly, BS+; abdominal aorta nontender and not dilated by palpation. Mildly obese Pulses 2+ Extremities: no clubbinbg cyanosis or edema, Homan's sign negative  Neurologic: grossly nonfocal   ECG  sinus bradycardia at 49 beats per minute. Previously noted diffuse  T-wave inversion in leads 77F, V2 through V6. Mild RV conduction delay.  LABS: Basic Metabolic Panel: No results found for this basename: NA, K, CL, CO2, GLUCOSE, BUN, CREATININE, CALCIUM, MG, PHOS,  in the last 72 hours Liver Function Tests: No results found for this basename: AST, ALT, ALKPHOS, BILITOT, PROT, ALBUMIN,  in the last 72 hours No results found for this basename: LIPASE, AMYLASE,  in the last 72 hours CBC: No results found for this basename: WBC, NEUTROABS, HGB, HCT, MCV, PLT,  in the last 72 hours Cardiac Enzymes: No results found for this basename: CKTOTAL, CKMB, CKMBINDEX, TROPONINI,  in the last 72 hours BNP: No components found with this basename: POCBNP,  D-Dimer: No results found for this basename: DDIMER,  in the  last 72 hours Hemoglobin A1C: No results found for this basename: HGBA1C,  in the last 72 hours Fasting Lipid Panel: No results found for this basename: CHOL, HDL, LDLCALC, TRIG, CHOLHDL, LDLDIRECT,  in the last 72 hours Thyroid Function Tests: No results found for this basename: TSH, T4TOTAL, FREET3, T3FREE, THYROIDAB,  in the last 72 hours Anemia Panel: No results found for this basename: VITAMINB12, FOLATE, FERRITIN, TIBC, IRON, RETICCTPCT,  in the last 72 hours  RADIOLOGY: No results found.    ASSESSMENT AND PLAN:  Denise Pugh is an 79 year old female with previous history of SVT treated with low dose beta blocker therapy. She has been demonstrated to have recurrent episodes of tachydysrhythmias with heart rates up to 185 beats per minute which appear to be stress mediated. She previously has been felt to have moderate asymmetric LVH without previously noted LVOT gradient her last echo Doppler study was 2 years ago. Presently, I am recommending slight additional titration of her beta blocker therapy such that she will increase her Toprol-XL to 25 mg in the morning and  12.5 mg at bedtime. I will decrease her losartan from 100 mg to 50 mg. I am scheduling  her for a followup echo Doppler study to further evaluate her asymmetric LVH, systolic and diastolic function. I am checking a seen at, and magnesium level today. She recently had thyroid function studies performed and these were normal the rediscussed the potential that if she continues to have recurrent episodes of tachycardia dysrhythmia to the point where she then developed significant bradycardia and pauses on increase dictations, that she may ultimately require a pacemaker. I will see her back in the office in 4 weeks for followup evaluation. She will continue to wear her CardioNet monitor. Lennette Bihari M.D., [facility] 08/10/2012 2:27 PM       KELLY,THOMAS A 08/10/2012 2:13 PM

## 2012-08-10 NOTE — Patient Instructions (Signed)
Your physician has recommended you make the following change in your medication: Cut the Lorsartan  Down to 50mg  (1/2 tablet) daily .Increase the metoprolol to 25 mg in the morning and 12.5 mg in the afternoon. (1/2 tablet).  Your physician has requested that you have an echocardiogram. Echocardiography is a painless test that uses sound waves to create images of your heart. It provides your doctor with information about the size and shape of your heart and how well your heart's chambers and valves are working. This procedure takes approximately one hour. There are no restrictions for this procedure.  FOLLOW-UP appointment with Dr. Tresa Endo in 4-6 weeks.   Your physician recommends that you return for lab work in: TODAY.

## 2012-08-16 ENCOUNTER — Ambulatory Visit (HOSPITAL_COMMUNITY): Payer: Medicare Other

## 2012-09-06 ENCOUNTER — Encounter: Payer: Self-pay | Admitting: Cardiovascular Disease

## 2012-09-15 ENCOUNTER — Ambulatory Visit (HOSPITAL_COMMUNITY)
Admission: RE | Admit: 2012-09-15 | Discharge: 2012-09-15 | Disposition: A | Discharge status: Home / Self Care | Payer: Medicare Other | Source: Ambulatory Visit | Attending: Internal Medicine | Admitting: Internal Medicine

## 2012-09-15 DIAGNOSIS — I10 Essential (primary) hypertension: Secondary | ICD-10-CM | POA: Insufficient documentation

## 2012-09-15 DIAGNOSIS — I4892 Unspecified atrial flutter: Secondary | ICD-10-CM | POA: Insufficient documentation

## 2012-09-15 DIAGNOSIS — I079 Rheumatic tricuspid valve disease, unspecified: Secondary | ICD-10-CM | POA: Insufficient documentation

## 2012-09-15 DIAGNOSIS — G4730 Sleep apnea, unspecified: Secondary | ICD-10-CM | POA: Insufficient documentation

## 2012-09-15 DIAGNOSIS — I059 Rheumatic mitral valve disease, unspecified: Secondary | ICD-10-CM | POA: Insufficient documentation

## 2012-09-15 DIAGNOSIS — E785 Hyperlipidemia, unspecified: Secondary | ICD-10-CM | POA: Insufficient documentation

## 2012-09-15 DIAGNOSIS — I359 Nonrheumatic aortic valve disorder, unspecified: Secondary | ICD-10-CM | POA: Insufficient documentation

## 2012-09-15 NOTE — Progress Notes (Signed)
2D Echo Performed 09/15/2012    Nomar Broad, RCS  

## 2012-09-27 ENCOUNTER — Ambulatory Visit (INDEPENDENT_AMBULATORY_CARE_PROVIDER_SITE_OTHER): Payer: Medicare Other | Admitting: Cardiovascular Disease

## 2012-09-27 ENCOUNTER — Encounter: Payer: Self-pay | Admitting: Cardiovascular Disease

## 2012-09-27 VITALS — BP 122/70 | HR 52 | Ht 63.0 in | Wt 184.1 lb

## 2012-09-27 DIAGNOSIS — E785 Hyperlipidemia, unspecified: Secondary | ICD-10-CM

## 2012-09-27 DIAGNOSIS — I498 Other specified cardiac arrhythmias: Secondary | ICD-10-CM

## 2012-09-27 DIAGNOSIS — E039 Hypothyroidism, unspecified: Secondary | ICD-10-CM

## 2012-09-27 MED ORDER — METOPROLOL SUCCINATE ER 25 MG PO TB24: ORAL_TABLET | ORAL | Status: DC

## 2012-09-27 NOTE — Progress Notes (Signed)
Patient ID: JAELENE GARCIAGARCIA, female   DOB: Nov 04, 1928, 79 y.o.   MRN: [id number] Patient ID: MAELIE CHRISWELL, female   DOB: 06-Mar-1929, 79 y.o.   MRN: [id number]  HPI: ARELI FRARY, is a 79 y.o. female who presents to the office today in followup of her recent increased tachycardia palpitations. Ms. Kimari Lienhard is an 79 year old female who has a history of supraventricular tachycardia and moderate left ventricular hypertrophy with documented "spade-like ventricle. "She has previously documented diffuse T-wave abnormalities. The stress test in May 2013 breast attenuation artifact with a post stress ejection fraction of 70% without evidence for scar or ischemia. I had seen her in March 2014 at which time she did have some episodes of this leg swelling I recommended the addition of torsemide 20 mg daily to her medical regimen recommended she discontinue her Voltaren. Subsequent laboratory on 07/15/2012 showed a creatinine of 1.72. She was advised to decrease her torsemide to every other day. She had experienced recurrent episodes of palpitations. He was had been started on a cardiac monitor which did reveal episodes of supraventricular tachycardia up to 185 beats per minute her metoprolol dose had been increased from 12.5-25 mg daily. She admits to having significant anxiety on the home front. Her husband has had some difficulty recently and in addition her water pump malfunction creating significant difficulties.  CardioNet monitor on her increased dose of Toprol-XL 25 mg still reveals bursts of tachycardia but palpitations and on 08/05/2012 at 12:30 she again had an episode of atrial tachycardia/A. flutter rate at 189 beats per minute.  On  May 17 she had an episode of tachycardia palpitations and did also have episodes of bradycardia or if some of her spells suggested SVT at 150 sig in addition to episode of accelerated idioventricular rhythm when she was sinus bradycardic. She has not filled any of these symptoms since  08/06/2012.  When I last saw Ms. Selle on 08/10/2012, I recommended slow additional titration of her metoprolol succinate so that she has now been taking this 25 mg in the morning and 12.5 mg at night. She still limits his significant stress on the home front particularly with her husband of pneumonia COPD CHF has had decreasing mental function. However, she feels like this additional beta blocker dose as assaulted in her recent episodes of tachycardia palpitations. She denies any dizziness.   A 2-D echo Doppler study was done on 09/15/2012. It showed an ejection fraction of 60-65%. She did have grade 1 diastolic dysfunction and elevated LV filling pressures.  The aortic valve was as sclerotic without stenosis and there was mild/moderate central regurgitation. 10 mild mitral regurgitation and mild tricuspid regurgitation with mild pulmonary hypertension with a PA estimate pressure at 47 mm.  Past Medical History  Diagnosis Date  . Thyroid disease   . Arthritis   . Irregular heart beat   . Arrhythmia     HX of SVT. CARDIONET MONITOR 07/15/12 TO 08/13/12  . Sleep apnea     SLEEP STUDY-[facility]  . Hypertension 08/25/10    ECHO-EF 60-65%  . Hyperlipidemia 08/06/11    lexiscan myoview-normal. Due to severe attenuation the study specificity and sensitivty are deminished.    Past Surgical History  Procedure Laterality Date  . Knee arthroscopy    . Cardiac catheterization  07/06/01    spade-like configuration    Allergies  Allergen Reactions  . Penicillins Other (See Comments)    tingling  . Tetanus Toxoid Swelling  Current Outpatient Prescriptions  Medication Sig Dispense Refill  . acetaminophen (TYLENOL) 500 MG tablet Take 500 mg by mouth every 6 (six) hours as needed. For fever      . Calcium Carb-Cholecalciferol (CALCIUM 1000 + D PO) Take 1 tablet by mouth daily.      . celecoxib (CELEBREX) 100 MG capsule Take 100-200 mg by mouth as needed. For pain      .  Cholecalciferol (VITAMIN D) 2000 UNITS tablet Take 2,000 Units by mouth daily.      . citalopram (CELEXA) 40 MG tablet Take 40 mg by mouth daily.      Marland Kitchen dexlansoprazole (DEXILANT) 60 MG capsule Take 60 mg by mouth 2 (two) times daily.      . diclofenac (VOLTAREN) 75 MG EC tablet Take 75 mg by mouth 2 (two) times daily.      Marland Kitchen docusate sodium (COLACE) 100 MG capsule Take 100 mg by mouth 2 (two) times daily.      Marland Kitchen levothyroxine (SYNTHROID, LEVOTHROID) 75 MCG tablet Take 75 mcg by mouth daily.      Marland Kitchen losartan (COZAAR) 100 MG tablet Take 100 mg by mouth daily. Take 1/2 tablet daily      . metoprolol succinate (TOPROL-XL) 25 MG 24 hr tablet Take 25 mg by mouth 2 (two) times daily.       . Multiple Vitamin (MULITIVITAMIN WITH MINERALS) TABS Take 1 tablet by mouth daily.      Marland Kitchen oxybutynin (DITROPAN-XL) 10 MG 24 hr tablet Take 10 mg by mouth daily.      . rosuvastatin (CRESTOR) 10 MG tablet Take 1 tablet (10 mg total) by mouth at bedtime. 1 tablet at bedtime or as directed  90 tablet  3  . torsemide (DEMADEX) 20 MG tablet Take 20 mg by mouth every other day.      . traMADol (ULTRAM) 50 MG tablet Take 50 mg by mouth 2 (two) times daily.       . verapamil (CALAN-SR) 240 MG CR tablet Take 240 mg by mouth daily.       No current facility-administered medications for this visit.   Social history is notable in that she is now. Her husband did suffer a CVA and has been having more difficulty at home. She has one child, 2 step sons, one grandchild and 2 great-grandchildren. There is no tobacco or alcohol use. She does admit to frequent anxiety.  ROV is negative for fever chills night sweats. When she felt the episodes of palpitations she admits that these were bad days with reference to stress she denied any associated chest pressure. Recently she denies any palpitations She did note some mild shortness of breath. She denies rash. She denies indigestion nausea vomiting or diarrhea.  She denies any tremor;she  denies rash. Other system review is negative.  PE BP 122/70  Pulse 52  Ht 5\' 3"  (1.6 m)  Wt 184 lb 1.6 oz (83.507 kg)  BMI 32.62 kg/m2  General: Alert, oriented, no distress.  HEENT: Normocephalic, atraumatic. Pupils round and reactive; sclera anicteric; Mouth/Parynx benign; Mallinpatti scale 2 Neck: No JVD, no carotid briuts Lungs: clear to ausculatation and percussion; no wheezing or rales Heart: RRR, s1 s2 normal 2/6 SEM; no s3 Abdomen: soft, nontender; no hepatosplenomehaly, BS+; abdominal aorta nontender and not dilated by palpation. Mildly obese Pulses 2+ Extremities: no clubbinbg cyanosis or edema, Homan's sign negative  Neurologic: grossly nonfocal   ECG  sinus bradycardia at 52 beats per minute. Previously noted diffuse  T-wave inversion in leads 58F, V2 through V6. Mild RV conduction delay.  LABS: Basic Metabolic Panel: No results found for this basename: NA, K, CL, CO2, GLUCOSE, BUN, CREATININE, CALCIUM, MG, PHOS,  in the last 72 hours Liver Function Tests: No results found for this basename: AST, ALT, ALKPHOS, BILITOT, PROT, ALBUMIN,  in the last 72 hours No results found for this basename: LIPASE, AMYLASE,  in the last 72 hours CBC: No results found for this basename: WBC, NEUTROABS, HGB, HCT, MCV, PLT,  in the last 72 hours Cardiac Enzymes: No results found for this basename: CKTOTAL, CKMB, CKMBINDEX, TROPONINI,  in the last 72 hours BNP: No components found with this basename: POCBNP,  D-Dimer: No results found for this basename: DDIMER,  in the last 72 hours Hemoglobin A1C: No results found for this basename: HGBA1C,  in the last 72 hours Fasting Lipid Panel: No results found for this basename: CHOL, HDL, LDLCALC, TRIG, CHOLHDL, LDLDIRECT,  in the last 72 hours Thyroid Function Tests: No results found for this basename: TSH, T4TOTAL, FREET3, T3FREE, THYROIDAB,  in the last 72 hours Anemia Panel: No results found for this basename: VITAMINB12, FOLATE,  FERRITIN, TIBC, IRON, RETICCTPCT,  in the last 72 hours  RADIOLOGY: No results found.    ASSESSMENT AND PLAN:  Ms. Rullo is an 79 year old female with previous history of SVT treated with low dose beta blocker therapy. She has been demonstrated to have recurrent episodes of tachydysrhythmias with heart rates up to 185 beats per minute which appear to be stress mediated. She previously has been felt to have moderate asymmetric LVH without previously noted LVOT gradient her last echo Doppler study was 2 years ago. Presently, she feels improved on the slight increase in beta blocker therapy with Toprol 25 mg in the morning and 12.5 mg in the evening. She denies any significant episodes of dizziness or profound bradycardia and is there are no recurrent episodes of tachycardia palpitations. I did review her review her most recent echo Doppler study with her in detail. I also reviewed her recent laboratory the BUN was 37 currently 1.38. Magnesium 2.3. I'll see her in for a followup evaluation.  Lennette Bihari M.D., [facility] 09/27/2012 2:47 PM       Regis Wiland A 09/27/2012 2:47 PM

## 2012-09-27 NOTE — Patient Instructions (Addendum)
Be sure to take your metoprolol - 1 tablet (25mg ) each morning and 1/2 tablet (12.5mg ) each evening.  Dr. Tresa Endo would like to see you again in 4 months.

## 2012-09-28 ENCOUNTER — Encounter: Payer: Self-pay | Admitting: Cardiovascular Disease

## 2012-10-01 ENCOUNTER — Other Ambulatory Visit: Payer: Self-pay | Admitting: Cardiovascular Disease

## 2012-10-03 NOTE — Telephone Encounter (Signed)
Rx was sent to pharmacy electronically. 

## 2012-12-02 ENCOUNTER — Telehealth: Payer: Self-pay | Admitting: Cardiovascular Disease

## 2012-12-02 NOTE — Telephone Encounter (Signed)
Message copied by Lennette Bihari on Fri Dec 02, 2012  5:09 PM ------      Message from: Gaynelle Cage.      Created: Fri Oct 21, 2012  4:32 PM       I just need what to tell the patient. Not the clinical terminology for her echo. ------

## 2013-01-20 ENCOUNTER — Encounter: Payer: Self-pay | Admitting: Cardiovascular Disease

## 2013-01-20 ENCOUNTER — Ambulatory Visit (INDEPENDENT_AMBULATORY_CARE_PROVIDER_SITE_OTHER): Payer: Medicare Other | Admitting: Cardiovascular Disease

## 2013-01-20 VITALS — BP 140/60 | HR 55 | Ht 63.0 in | Wt 176.8 lb

## 2013-01-20 DIAGNOSIS — I498 Other specified cardiac arrhythmias: Secondary | ICD-10-CM

## 2013-01-20 DIAGNOSIS — I10 Essential (primary) hypertension: Secondary | ICD-10-CM

## 2013-01-20 DIAGNOSIS — E785 Hyperlipidemia, unspecified: Secondary | ICD-10-CM

## 2013-01-20 NOTE — Patient Instructions (Signed)
Your physician recommends that you schedule a follow-up appointment in: 6 MONTHS 

## 2013-01-21 ENCOUNTER — Encounter: Payer: Self-pay | Admitting: Cardiovascular Disease

## 2013-01-21 NOTE — Progress Notes (Signed)
Patient ID: Denise Pugh, female   DOB: 1928/12/13, 79 y.o.   MRN: [id number]    HPI: Denise Pugh, is a 79 y.o. female who presents to the office today in followup cardiology evaluation.   Denise Pugh is an 79 year old female who has a history of supraventricular tachycardia and moderate left ventricular hypertrophy with documented "spade-like ventricle."  She has previously documented diffuse T-wave abnormalities. A stress test in May 2013 breast attenuation artifact with a post stress ejection fraction of 70% without evidence for scar or ischemia. In March 2014 at which time she did have some episodes of this leg swelling and I recommended the addition of torsemide 20 mg daily to her medical regimen recommended and discontinued her Voltaren. Subsequent laboratory on 07/15/2012 showed a creatinine of 1.72. She was advised to decrease her torsemide to every other day. She had experienced recurrent episodes of palpitations. A cardiac monitor which did reveal episodes of supraventricular tachycardia up to 185 beats per minute her metoprolol dose had been increased from 12.5-25 mg daily. At that time she was having significant anxiety at home with her husband's illness. Her husband has had some difficulty recently and in addition her water pump malfunction creating significant difficulties.  CardioNet monitor on her increased dose of Toprol-XL 25 mg still reveals bursts of tachycardia but palpitations and on 08/05/2012 at 12:30 she again had an episode of atrial tachycardia/A. flutter rate at 189 beats per minute.  On  May 17 she had an episode of tachycardia palpitations and did also have episodes of bradycardia; some of her spells suggested SVT at 150 sig in addition to episode of accelerated idioventricular rhythm when she was sinus bradycardic.  On  08/10/2012, I recommended slow additional titration of her metoprolol succinate to 25 mg in the morning and 12.5 mg at night.   A 2-D echo Doppler study  on 09/15/2012 showed an ejection fraction of 60-65%. She did have grade 1 diastolic dysfunction and elevated LV filling pressures.  The aortic valve was as sclerotic without stenosis and there was mild/moderate central regurgitation. 10 mild mitral regurgitation and mild tricuspid regurgitation with mild pulmonary hypertension with a PA estimate pressure at 47 mm.  Since I last saw Denise Pugh, her husband passed away at the end of 10/06/2022. She has been grieving. She has been unaware of recurrent tachydysrhythmias. She denies presyncope. She denies chest pressure.  Past Medical History  Diagnosis Date  . Thyroid disease   . Arthritis   . Irregular heart beat   . Arrhythmia     HX of SVT. CARDIONET MONITOR 07/15/12 TO 08/13/12  . Sleep apnea     SLEEP STUDY-[facility]  . Hypertension 08/25/10    ECHO-EF 60-65%  . Hyperlipidemia 08/06/11    lexiscan myoview-normal. Due to severe attenuation the study specificity and sensitivty are deminished.    Past Surgical History  Procedure Laterality Date  . Knee arthroscopy    . Cardiac catheterization  07/06/01    spade-like configuration    Allergies  Allergen Reactions  . Penicillins Other (See Comments)    tingling  . Tetanus Toxoid Swelling    Current Outpatient Prescriptions  Medication Sig Dispense Refill  . acetaminophen (TYLENOL) 500 MG tablet Take 500 mg by mouth every 6 (six) hours as needed. For fever      . Calcium Carb-Cholecalciferol (CALCIUM 1000 + D PO) Take 1 tablet by mouth daily.      . Cholecalciferol (VITAMIN D)  2000 UNITS tablet Take 2,000 Units by mouth daily.      Marland Kitchen dexlansoprazole (DEXILANT) 60 MG capsule Take 60 mg by mouth 2 (two) times daily.      . diclofenac (VOLTAREN) 75 MG EC tablet Take 75 mg by mouth 2 (two) times daily.      Marland Kitchen docusate sodium (COLACE) 100 MG capsule Take 100 mg by mouth 2 (two) times daily.      Marland Kitchen FLUoxetine (PROZAC) 20 MG capsule Take 20 mg by mouth daily.      Marland Kitchen  levothyroxine (SYNTHROID, LEVOTHROID) 75 MCG tablet Take 75 mcg by mouth daily.      Marland Kitchen losartan-hydrochlorothiazide (HYZAAR) 100-12.5 MG per tablet takes 1/2 tablet      . metoprolol succinate (TOPROL-XL) 25 MG 24 hr tablet Take 1 tablet by mouth each morning and 1/2 tablet each evening.  135 tablet  1  . Multiple Vitamin (MULITIVITAMIN WITH MINERALS) TABS Take 1 tablet by mouth daily.      Marland Kitchen oxybutynin (DITROPAN-XL) 10 MG 24 hr tablet Take 10 mg by mouth daily.      . rosuvastatin (CRESTOR) 10 MG tablet Take 10 mg by mouth. Takes 1/2 tablet daily      . torsemide (DEMADEX) 20 MG tablet Take 20 mg by mouth every other day.      . traMADol (ULTRAM) 50 MG tablet Take 50 mg by mouth 2 (two) times daily.        No current facility-administered medications for this visit.   Social history is notable in that she is now. Her husband did suffer a CVA and has been having more difficulty at home. She has one child, 2 step sons, one grandchild and 2 great-grandchildren. There is no tobacco or alcohol use. She does admit to frequent anxiety.  ROV is negative for fever chills night sweats. She denies visual symptoms. She denies presyncope or syncope. She denies cough. She denies change in sputum production. There is no wheezing. She denies PND orthopnea. When she felt the episodes of palpitations she admits that these were bad days with reference to stress she denied any associated chest pressure. Recently she denies any palpitations She did note some mild shortness of breath. There is no chest pain. She denies rash. She denies indigestion nausea vomiting or diarrhea.  She denies GU symptoms. She does have hypothyroidism on Synthroid replacement. She denies any tremor;she denies rash. Other comprehensive 12 point system review is negative.  PE BP 140/60  Pulse 55  Ht 5\' 3"  (1.6 m)  Wt 176 lb 12.8 oz (80.196 kg)  BMI 31.33 kg/m2  Repeat blood pressure by me was 140/70. General: Alert, oriented, no distress.    HEENT: Normocephalic, atraumatic. Pupils round and reactive; sclera anicteric; Mouth/Parynx benign; Mallinpatti scale 2 Neck: No JVD, no carotid briuts Lungs: clear to ausculatation and percussion; no wheezing or rales Heart: RRR, s1 s2 normal 2/6 SEM; no s3 Abdomen: soft, nontender; no hepatosplenomehaly, BS+; abdominal aorta nontender and not dilated by palpation. Mildly obese Pulses 2+ Extremities: no clubbinbg cyanosis or edema, Homan's sign negative  Neurologic: grossly nonfocal Psychologic: She continues to grieve the loss of her husband.   ECG  sinus bradycardia at 55 beats per minute. Previously noted diffuse T-wave inversion in leads 69F, V2 through V6. Mild RV conduction delay.  LABS: Basic Metabolic Panel: No results found for this basename: NA, K, CL, CO2, GLUCOSE, BUN, CREATININE, CALCIUM, MG, PHOS,  in the last 72 hours Liver Function Tests: No  results found for this basename: AST, ALT, ALKPHOS, BILITOT, PROT, ALBUMIN,  in the last 72 hours No results found for this basename: LIPASE, AMYLASE,  in the last 72 hours CBC: No results found for this basename: WBC, NEUTROABS, HGB, HCT, MCV, PLT,  in the last 72 hours Cardiac Enzymes: No results found for this basename: CKTOTAL, CKMB, CKMBINDEX, TROPONINI,  in the last 72 hours BNP: No components found with this basename: POCBNP,  D-Dimer: No results found for this basename: DDIMER,  in the last 72 hours Hemoglobin A1C: No results found for this basename: HGBA1C,  in the last 72 hours Fasting Lipid Panel: No results found for this basename: CHOL, HDL, LDLCALC, TRIG, CHOLHDL, LDLDIRECT,  in the last 72 hours Thyroid Function Tests: No results found for this basename: TSH, T4TOTAL, FREET3, T3FREE, THYROIDAB,  in the last 72 hours Anemia Panel: No results found for this basename: VITAMINB12, FOLATE, FERRITIN, TIBC, IRON, RETICCTPCT,  in the last 72 hours  RADIOLOGY: No results found.    ASSESSMENT AND PLAN:   Ms.  Pugh is an 79 year old female with a history of SVT and documented moderate left ventricle hypertrophy the with a "spade-like ventricle. " She has been documented to have tachycardia with rates up to 185 beats per minute which have been contributed by recent  stress. Her husband has recently passed away. Presently, she feels her current dose of beta blocker is controlling her previous tachycardic spells. She's not having episodes of presyncope or syncope. She is on thyroid replacement medicine for hypothyroidism and has been taking Synthroid 75 mcg with normal TSH level. She denies any recent swelling or torsemide 20 mg which he takes every other day. In the past she did develop some mild renal insufficiency. I have suggested that as long as she's not having swelling she can try taking this every third day. Her ECG shows diffuse T-wave abnormalities which have been noted previously and remained unchanged. She's not having any chest pain. She's not having any bleeding. Her blood pressure is well controlled with beta blockade consisting of Toprol-XL 25 mg in the morning and 12.5 mg at night as well as Hyzaar. She does have hyperlipidemia and is tolerating Crestor. I will see her in 6 months for followup cardiology evaluation or sooner if problems arise.  Lennette Bihari M.D., [facility] 01/21/2013 11:27 AM       KELLY,THOMAS A 01/21/2013 11:27 AM

## 2013-01-27 ENCOUNTER — Ambulatory Visit: Payer: Medicare Other | Admitting: Cardiovascular Disease

## 2013-02-20 IMAGING — CR DG CHEST 2V
2 series · 2 of 2 positions shown · non-contrast
Comparison: PA and lateral chest 06/12/2007.

CLINICAL DATA: Cough and vomiting.

CHEST - 2 VIEW

[view not recorded (1 of 2)]
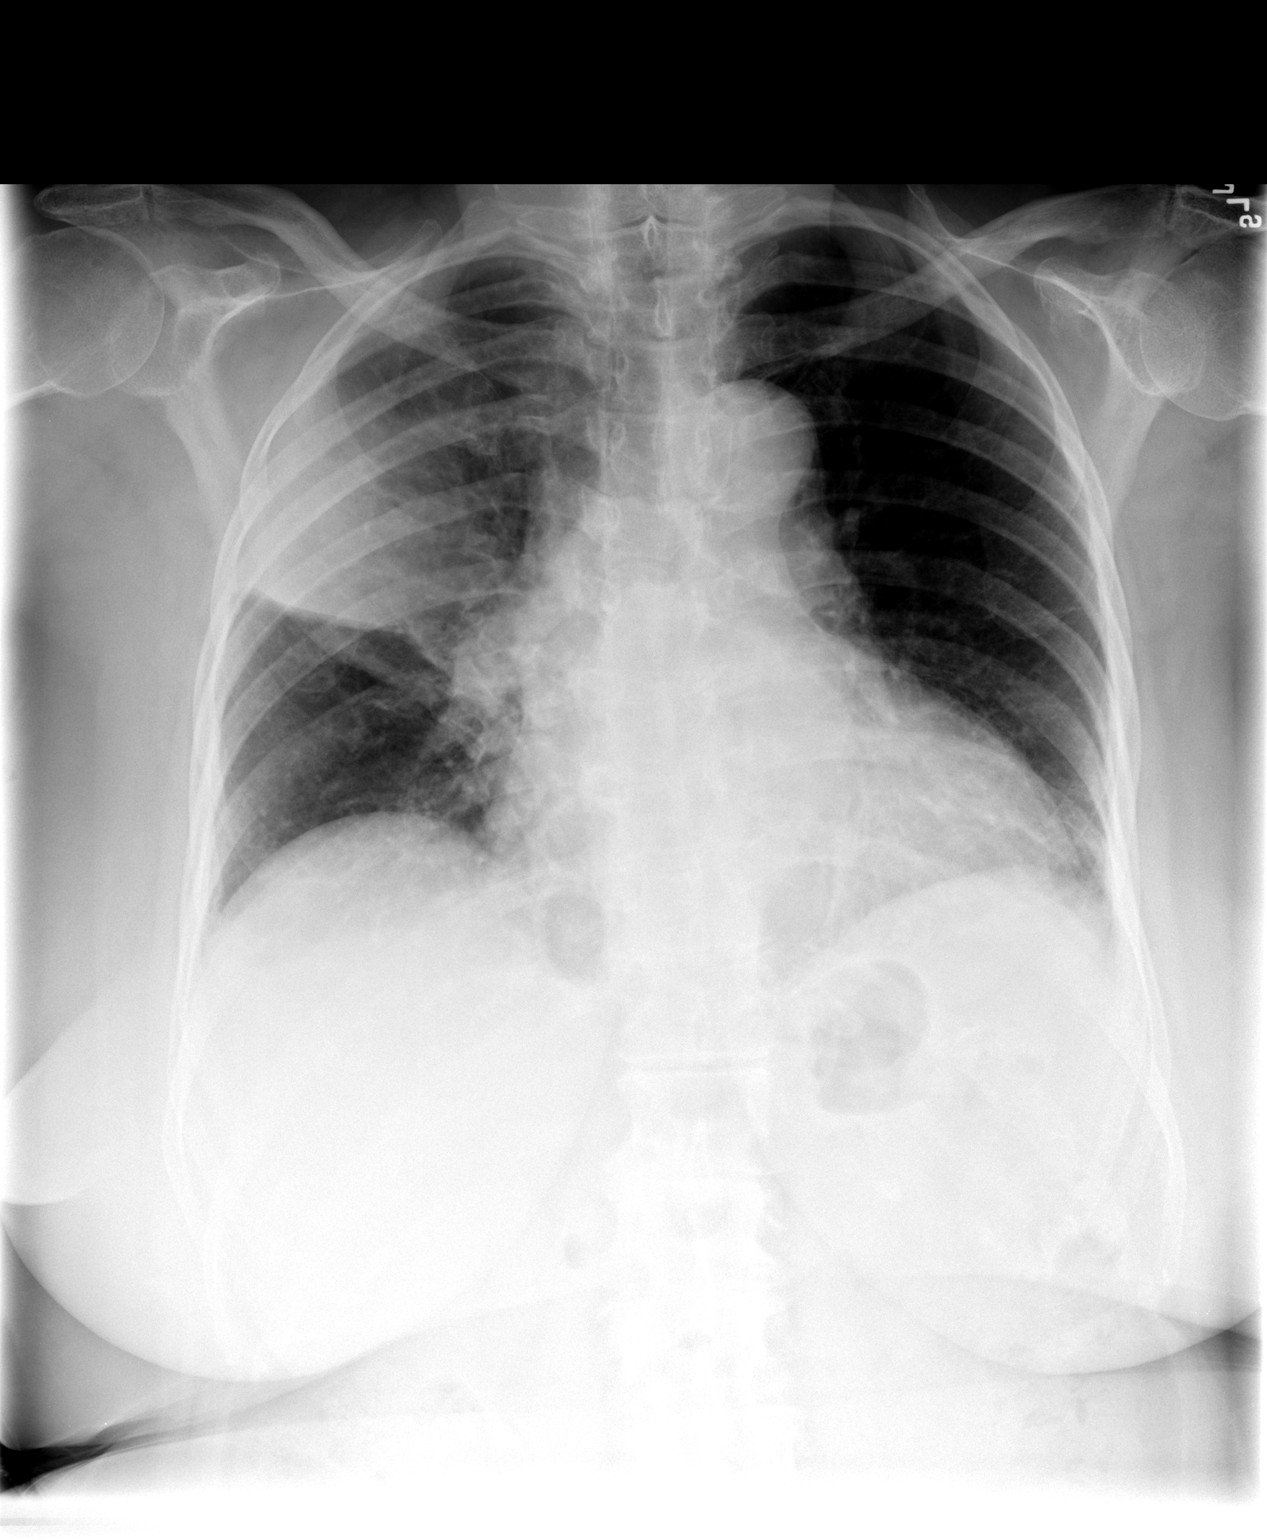

[view not recorded (2 of 2)]
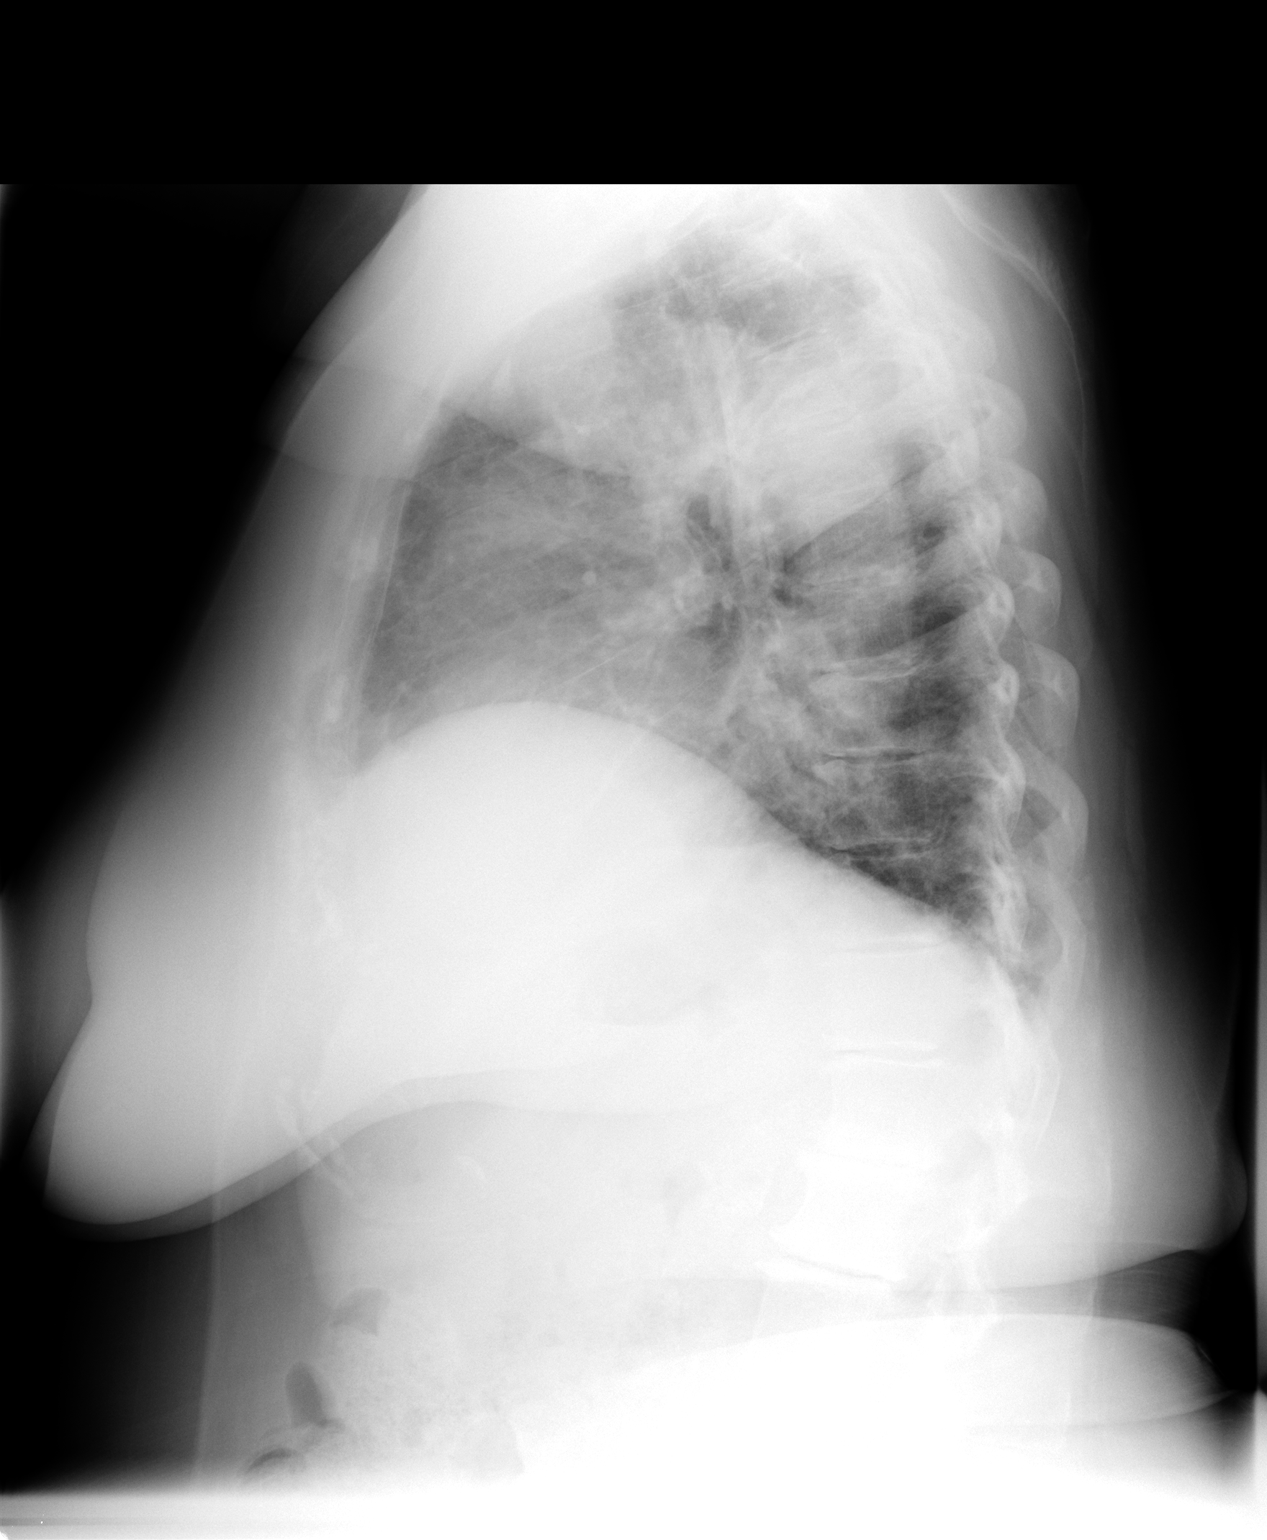

[2 of 2 positions shown; findings below may reference images not displayed]

FINDINGS: The patient has dense right upper lobe airspace disease
consistent with pneumonia.  There is also airspace opacity in the
left base.  No pneumothorax or effusion.
IMPRESSION: Right upper and left lower lobe pneumonia.

## 2013-02-23 ENCOUNTER — Telehealth: Payer: Self-pay | Admitting: Cardiovascular Disease

## 2013-02-23 MED ORDER — METOPROLOL SUCCINATE ER 25 MG PO TB24: ORAL_TABLET | ORAL | Status: DC

## 2013-02-23 NOTE — Telephone Encounter (Signed)
Returned call and Denise Pugh verified x 2.  Denise Pugh stated she has some metoprolol that expired 02/2012 and 01/2013.  Denise Pugh wants to know if she can still take them.  Stated she was still receiving enough for two pills/day when it decreased to 1 1/2 tabs daily.  Denise Pugh informed RN cannot advise her to take expired meds.  Denise Pugh advised to discard all expired meds and to take current metoprolol that expires 04/2013 until she picks up refill.  Denise Pugh stated her insurance has changed and she does not use [facility] anymore.  Would like refill sent to [facility] in [place].  Denise Pugh informed Refill(s) sent to pharmacy for 90-day supply per her request.  Denise Pugh verbalized understanding and agreed w/ plan.  Denise Pugh also wants to know if Dr. Tresa Endo received lab results from Dr. Scharlene Gloss office.  Stated she received a call about the results that her kidney levels were high and told to stop taking torsemide for two weeks and return for lab.  Denise Pugh stated repeat labs are on 12.15.14.  Denise Pugh advised to continue following instructions from PCP and informed Dr. Tresa Endo will be notified to review results.  Denise Pugh also advised to make sure repeat results are sent to Dr. Tresa Endo as well.  Denise Pugh verbalized understanding and agreed w/ plan.  Of note, RN did wish Denise Pugh a Happy Birthday today.

## 2013-02-23 NOTE — Telephone Encounter (Signed)
Needs to talk to nurse about metroprol er 25 mg  Please call

## 2013-05-29 ENCOUNTER — Encounter: Payer: Self-pay | Admitting: Cardiovascular Disease

## 2013-05-29 ENCOUNTER — Ambulatory Visit (INDEPENDENT_AMBULATORY_CARE_PROVIDER_SITE_OTHER): Payer: Medicare Other | Admitting: Cardiovascular Disease

## 2013-05-29 VITALS — BP 148/80 | HR 48 | Ht 63.0 in | Wt 174.3 lb

## 2013-05-29 DIAGNOSIS — I10 Essential (primary) hypertension: Secondary | ICD-10-CM

## 2013-05-29 DIAGNOSIS — F4321 Adjustment disorder with depressed mood: Secondary | ICD-10-CM

## 2013-05-29 DIAGNOSIS — I498 Other specified cardiac arrhythmias: Secondary | ICD-10-CM

## 2013-05-29 DIAGNOSIS — E785 Hyperlipidemia, unspecified: Secondary | ICD-10-CM

## 2013-05-29 DIAGNOSIS — E039 Hypothyroidism, unspecified: Secondary | ICD-10-CM

## 2013-05-29 NOTE — Progress Notes (Signed)
Patient ID: Denise Pugh, female   DOB: 1928/05/24, 80 y.o.   MRN: [id number]     HPI: Denise Pugh, is a 80 y.o. female who presents to the office today in followup cardiology evaluation.  [name] is an 80 year old female who has a history of supraventricular tachycardia and moderate left ventricular hypertrophy with documented "spade-like ventricle."  She has previously documented diffuse T-wave abnormalities. A stress test in May 2013 showed breast attenuation artifact with a post stress ejection fraction of 70% without evidence for scar or ischemia. In March 2014  she did had episodes of leg swelling and I recommended the addition of torsemide 20 mg daily to her medical regimen recommended and discontinued her Voltaren. Subsequent laboratory on 07/15/2012 showed a creatinine of 1.72. She was advised to decrease her torsemide to every other day. She had experienced recurrent episodes of palpitations. A cardiac monitor which did reveal episodes of supraventricular tachycardia up to 185 beats per minute her metoprolol dose had been increased from 12.5-25 mg daily. At that time she was having significant anxiety at home with her husband's illness. Her husband has had some difficulty recently and in addition her water pump malfunction creating significant difficulties.  CardioNet monitor on her increased dose of Toprol-XL 25 mg still reveals bursts of tachycardia but palpitations and on 08/05/2012 at 12:30 she again had an episode of atrial tachycardia/A. flutter rate at 189 beats per minute.  On  May 17 she had an episode of tachycardia palpitations and did also have episodes of bradycardia; some of her spells suggested SVT at 150 sig in addition to episode of accelerated idioventricular rhythm when she was sinus bradycardic.  On  08/10/2012, I recommended slow additional titration of her metoprolol succinate to 25 mg in the morning and 12.5 mg at night.   A 2-D echo Doppler study on 09/15/2012  showed an ejection fraction of 60-65%. She did have grade 1 diastolic dysfunction and elevated LV filling pressures.  The aortic valve was as sclerotic without stenosis and there was mild/moderate central regurgitation. 10 mild mitral regurgitation and mild tricuspid regurgitation with mild pulmonary hypertension with a PA estimate pressure at 47 mm.  Denise Pugh husband passed away at the end of 10/21/12. She has been grieving. She has been unaware of recurrent tachydysrhythmias. She denies presyncope. She denies chest pressure.    Apparently, since I had last seen her, she was told by Dr. [name] to reduce her beta blocker dose. Approximately 3 weeks ago, she again had episodes of prior dose of 25 mg in the morning and 12.5 mg at night and this has completely resolved her palpitations. She presents now for evaluation. She denies associated chest tightness. She denies PND or orthopnea. She still admits that she does get fairly labile as a result of grieving still for her husband after 24 years of marriage.  Past Medical History  Diagnosis Date  . Thyroid disease   . Arthritis   . Irregular heart beat   . Arrhythmia     HX of SVT. CARDIONET MONITOR 07/15/12 TO 08/13/12  . Sleep apnea     SLEEP STUDY-[facility]  . Hypertension 08/25/10    ECHO-EF 60-65%  . Hyperlipidemia 08/06/11    lexiscan myoview-normal. Due to severe attenuation the study specificity and sensitivty are deminished.    Past Surgical History  Procedure Laterality Date  . Knee arthroscopy    . Cardiac catheterization  07/06/01    spade-like  configuration    Allergies  Allergen Reactions  . Penicillins Other (See Comments)    tingling  . Tetanus Toxoid Swelling    Current Outpatient Prescriptions  Medication Sig Dispense Refill  . acetaminophen (TYLENOL) 500 MG tablet Take 500 mg by mouth every 6 (six) hours as needed. For fever      . ALPRAZolam (XANAX) 0.5 MG tablet Take 1 tablet by mouth every 8  (eight) hours.      . Calcium Carb-Cholecalciferol (CALCIUM 1000 + D PO) Take 1 tablet by mouth daily.      . Cholecalciferol (VITAMIN D) 2000 UNITS tablet Take 2,000 Units by mouth daily.      Marland Kitchen dexlansoprazole (DEXILANT) 60 MG capsule Take 60 mg by mouth 2 (two) times daily.      . diclofenac (VOLTAREN) 75 MG EC tablet Take 75 mg by mouth 2 (two) times daily.      Marland Kitchen docusate sodium (COLACE) 100 MG capsule Take 100 mg by mouth 2 (two) times daily.      Marland Kitchen levothyroxine (SYNTHROID, LEVOTHROID) 75 MCG tablet Take 75 mcg by mouth daily.      Marland Kitchen losartan-hydrochlorothiazide (HYZAAR) 100-12.5 MG per tablet takes 1/2 tablet      . metoprolol succinate (TOPROL-XL) 25 MG 24 hr tablet Take 1 tablet by mouth each morning and 1/2 tablet each evening.  135 tablet  2  . Multiple Vitamin (MULITIVITAMIN WITH MINERALS) TABS Take 1 tablet by mouth daily.      Marland Kitchen oxybutynin (DITROPAN-XL) 10 MG 24 hr tablet Take 10 mg by mouth daily.      . rosuvastatin (CRESTOR) 10 MG tablet Take 10 mg by mouth. Takes 1/2 tablet daily      . traMADol (ULTRAM) 50 MG tablet Take 50 mg by mouth 2 (two) times daily.        No current facility-administered medications for this visit.   Social history is notable in that she is now. Her husband did suffer a CVA and has been having more difficulty at home. She has one child, 2 step sons, one grandchild and 2 great-grandchildren. There is no tobacco or alcohol use. She does admit to frequent anxiety.  ROV is negative for fever chills night sweats. She denies visual symptoms. She denies presyncope or syncope. She denies cough. She denies change in sputum production. There is no wheezing. She denies PND orthopnea. When she felt the episodes of palpitations she admits that these were bad days with reference to stress she denied any associated chest pressure. Her palpitations improved with her recent return to her prior blocker regimen. She did note some mild shortness of breath. There is no chest  pain. She denies rash. She denies indigestion nausea vomiting or diarrhea.  She denies GU symptoms. She does have hypothyroidism on Synthroid replacement. She denies any tremor;she denies rash. She is unaware of diabetes. She does have history of hypothyroidism. She states her primary physician had recently checked labs.  Other comprehensive 14 point system review is negative.  PE BP 148/80  Pulse 48  Ht 5\' 3"  (1.6 m)  Wt 174 lb 4.8 oz (79.062 kg)  BMI 30.88 kg/m2  Repeat blood pressure by me was 140/70. General: Alert, oriented, no distress.  HEENT: Normocephalic, atraumatic. Pupils round and reactive; sclera anicteric; Mouth/Parynx benign; Mallinpatti scale 2 Neck: No JVD, no carotid bruits Lungs: clear to ausculatation and percussion; no wheezing or rales Chest wall: Nontender to Heart: RRR, s1 s2 normal 2/6 SEM in the aortic  region. No diastolic murmur the; no s3; no rubs thrills or heaves Abdomen: soft, nontender; no hepatosplenomehaly, BS+; abdominal aorta nontender and not dilated by palpation. Mildly obese Back: No CVA tenderness Pulses 2+ Extremities: no clubbinbg cyanosis or edema, Homan's sign negative  Neurologic: grossly nonfocal Psychologic: She continues to grieve the loss of her husband.  ECG today protheses independently read by me): Bradycardia at 48 beats per minute. RV conduction delay. Previously noted diffuse inferior and anterolateral T wave inversion  Prior ECG of 01/20/2013: sinus bradycardia at 55 beats per minute. Previously noted diffuse T-wave inversion in leads 35F, V2 through V6. Mild RV conduction delay.  LABS: Basic Metabolic Panel: No results found for this basename: NA, K, CL, CO2, GLUCOSE, BUN, CREATININE, CALCIUM, MG, PHOS,  in the last 72 hours Liver Function Tests: No results found for this basename: AST, ALT, ALKPHOS, BILITOT, PROT, ALBUMIN,  in the last 72 hours No results found for this basename: LIPASE, AMYLASE,  in the last 72  hours CBC: No results found for this basename: WBC, NEUTROABS, HGB, HCT, MCV, PLT,  in the last 72 hours Cardiac Enzymes: No results found for this basename: CKTOTAL, CKMB, CKMBINDEX, TROPONINI,  in the last 72 hours BNP: No components found with this basename: POCBNP,  D-Dimer: No results found for this basename: DDIMER,  in the last 72 hours Hemoglobin A1C: No results found for this basename: HGBA1C,  in the last 72 hours Fasting Lipid Panel: No results found for this basename: CHOL, HDL, LDLCALC, TRIG, CHOLHDL, LDLDIRECT,  in the last 72 hours Thyroid Function Tests: No results found for this basename: TSH, T4TOTAL, FREET3, T3FREE, THYROIDAB,  in the last 72 hours Anemia Panel: No results found for this basename: VITAMINB12, FOLATE, FERRITIN, TIBC, IRON, RETICCTPCT,  in the last 72 hours  RADIOLOGY: No results found.    ASSESSMENT AND PLAN:   Ms. Dumais is an 80 year old Caucasian female with a history of SVT and documented moderate left ventricle hypertrophy the with a "spade-like ventricle. " She has diffuse T-wave abnormalities which have been present for years. In 2003 she did undergo a cardiac catheterization and was not felt to have coronary obstructive disease but she had very minimal midsystolic bridging not felt to be significant in her LAD territory.  In the past, she has been documented to have tachycardia with rates up to 185 beats per minute which have been contributed by recent stress since her husband has recently passed away. Presently, she feels her current dose of beta blocker is controlling her previous tachycardic spells but when she had reduced the dose she did note breakthrough tachydysrhythmias. She's not having episodes of presyncope or syncope. She is on thyroid replacement medicine for hypothyroidism and has been taking Synthroid 75 mcg with normal TSH level.  She did have laboratory done over the last several weeks by her primary physician. I will try to obtain  these results.  She no longer is taking torsemide and denies any recurrent leg swelling. Her EKG today does reveal sinus bradycardia but he seems to be tolerating this heart rate and I will not reduce her beta blocker regimen. I will see her in 6 months for cardiology reevaluation or sooner if problems arise.  Troy Sine M.D., [facility] 05/29/2013 2:23 PM       Hunt Zajicek A 05/29/2013 2:23 PM

## 2013-05-29 NOTE — Patient Instructions (Signed)
Your physician recommends that you schedule a follow-up appointment in: 6 months. No changes were made today. 

## 2013-06-08 ENCOUNTER — Ambulatory Visit (INDEPENDENT_AMBULATORY_CARE_PROVIDER_SITE_OTHER): Payer: No Typology Code available for payment source | Admitting: Psychology

## 2013-06-08 DIAGNOSIS — F329 Major depressive disorder, single episode, unspecified: Secondary | ICD-10-CM

## 2013-06-08 DIAGNOSIS — F3289 Other specified depressive episodes: Secondary | ICD-10-CM

## 2013-06-08 DIAGNOSIS — F411 Generalized anxiety disorder: Secondary | ICD-10-CM

## 2013-06-16 ENCOUNTER — Telehealth: Payer: Self-pay | Admitting: Cardiovascular Disease

## 2013-06-16 NOTE — Telephone Encounter (Signed)
Please call,pt thinks she might need a monitor. Her heart is still skipping a lot,it wakes her up at night.

## 2013-06-16 NOTE — Telephone Encounter (Signed)
Spoke to  Patient. She states she is not feeling well , dizziness , has palp.  appointment made  For 06/19/2013  8:30. PATIENT ASKED FOR ANOTHER  TIME. RN OFFERED OTHER TIMES PATIENT DECLINE  AND DECIDE  TO KEEP  MONDAY APPOINTMENT SHE ALSO WANTED TO KNOW IF LAB WORK FROM DR HALL HAS MADE TO OFFICE. RN STATED LABS ARE NOT PRESENT.  RN OBTAINED PHONE # [phone]--- CALLED DR. Earnest Conroy HALL OFFICE  - WILL SEND LABS.

## 2013-06-19 ENCOUNTER — Ambulatory Visit (INDEPENDENT_AMBULATORY_CARE_PROVIDER_SITE_OTHER): Payer: Medicare Other | Admitting: Cardiology

## 2013-06-19 VITALS — BP 134/78 | HR 155 | Ht 63.0 in | Wt 172.0 lb

## 2013-06-19 DIAGNOSIS — Z87898 Personal history of other specified conditions: Secondary | ICD-10-CM

## 2013-06-19 DIAGNOSIS — I4891 Unspecified atrial fibrillation: Secondary | ICD-10-CM

## 2013-06-19 DIAGNOSIS — I498 Other specified cardiac arrhythmias: Secondary | ICD-10-CM

## 2013-06-19 DIAGNOSIS — Z8679 Personal history of other diseases of the circulatory system: Secondary | ICD-10-CM

## 2013-06-19 MED ORDER — DILTIAZEM HCL ER COATED BEADS 120 MG PO CP24: 120.0000 mg | ORAL_CAPSULE | Freq: Every day | ORAL | Status: DC

## 2013-06-19 NOTE — Patient Instructions (Signed)
Continue taking Metoprolol as directed Take Cardizem 120 mg daily F/u with Cecilie Kicks on 06/23/13

## 2013-06-20 ENCOUNTER — Telehealth: Payer: Self-pay | Admitting: Cardiology

## 2013-06-20 DIAGNOSIS — R002 Palpitations: Secondary | ICD-10-CM

## 2013-06-20 NOTE — Telephone Encounter (Signed)
Pt says she will be here between 10:30 and 11 for her monitor.

## 2013-06-21 ENCOUNTER — Encounter (HOSPITAL_COMMUNITY): Payer: Self-pay | Admitting: Psychology

## 2013-06-21 NOTE — Progress Notes (Signed)
Patient:   Denise Pugh   DOB:   1928/06/27  MR Number:  [id number]  Location:  [facility] [address] [place] [address] Dept: [phone]           Date of Service:   06/08/2013  Start Time:   10 AM End Time:   11 AM  Provider/Observer:  Edgardo Roys PSYD       Billing Code/Service: [id number]  Chief Complaint:     Chief Complaint  Patient presents with  . Anxiety  . Depression  . Stress    Reason for Service:  The patient was self referred for psychotherapeutic interventions. The patient is an 80 year old Caucasian female that has had a lot of issues to deal with recently primarily dealing with grief, responsibilities after her husband died and feeling very lonely and isolated. The patient reports that her husband who is 53 years old got very sick in may of 2014 and then get declining in health. Eventually he developed significant dementia and he died in Oct 11, 2012. After his death she was faced with an overwhelming amount of decisions and responsibilities that she is not familiar with or responsible for coping with past. The patient reports that she has been very lonely and worried/concerned about not being able to financially manage her life. She has been having difficulty keeping up with the maintenance and upkeep of her home not being able to physically do house maintenance. The patient reports that she is crying often and simply feels frozen in her ability to cope and manage. She reports that the symptoms started after her husband of 45 years passed away in 10-11-12. She reports that she was never totally responsible for making any ultimate decisions and this is been extremely difficult for her.  Current Status:  The patient reports a major symptom she is having to cope with his anxiety, especially when friends ask her how she is doing and tell her things like "God will take care  of you." There also constantly telling her not to worry and asking her why she is so concerned. The patient describes moderate to significant symptoms of anxiety, mood changes, appetite disturbance, excessive worrying, low energy as well as symptoms of depression and sadness and worry/panic.  Reliability of Information: The information was provided by the patient as well as review of available medical records.  Behavioral Observation: Denise Pugh  presents as a 80 y.o.-year-old Right Caucasian Female who appeared her stated age. her dress was Appropriate and she was Well Groomed and her manners were Appropriate to the situation.  There were not any physical disabilities noted.  she displayed an appropriate level of cooperation and motivation.    Interactions:    Active   Attention:   Distracted by internal preoccupations.  Memory:   within normal limits  Visuo-spatial:   within normal limits  Speech (Volume):  low  Speech:   normal pitch  Thought Process:  Coherent  Though Content:  WNL  Orientation:   person, place, time/date and situation  Judgment:   Fair  Planning:   Fair  Affect:    Anxious and Depressed  Mood:    Anxious and Depressed  Insight:   Good  Intelligence:   normal  Marital Status/Living: The patient reports that she was born and raised in [place]. She is widowed and her husband died in Oct 12, 1998 working. The patient  is a 65 year old daughter and 2 stepsons 5 year old son and a 40 year old son. She currently lives by herself with her pet beagle. She has an 26 year old brother who is still alive.  Current Employment: The patient is not working now and is retired.  Past Employment:  The patient worked in Engineer, petroleum, Medical sales representative, and as a Secretary/administrator. She reports that she's not working with people and this was an important part of her life.  Substance Use:  No concerns of substance abuse are reported.  there no indications of any substance  abuse past or present.  Education:   HS Graduate  Medical History:   Past Medical History  Diagnosis Date  . Thyroid disease   . Arthritis   . Irregular heart beat   . Arrhythmia     HX of SVT. CARDIONET MONITOR 07/15/12 TO 08/13/12  . Sleep apnea     SLEEP STUDY-[facility]  . Hypertension 08/25/10    ECHO-EF 60-65%  . Hyperlipidemia 08/06/11    lexiscan myoview-normal. Due to severe attenuation the study specificity and sensitivty are deminished.        Outpatient Encounter Prescriptions as of 06/08/2013  Medication Sig  . acetaminophen (TYLENOL) 500 MG tablet Take 500 mg by mouth every 6 (six) hours as needed. For fever  . ALPRAZolam (XANAX) 0.5 MG tablet Take 1 tablet by mouth every 8 (eight) hours.  . Calcium Carb-Cholecalciferol (CALCIUM 1000 + D PO) Take 1 tablet by mouth daily.  . Cholecalciferol (VITAMIN D) 2000 UNITS tablet Take 2,000 Units by mouth daily.  Marland Kitchen dexlansoprazole (DEXILANT) 60 MG capsule Take 60 mg by mouth 2 (two) times daily.  . diclofenac (VOLTAREN) 75 MG EC tablet Take 75 mg by mouth 2 (two) times daily.  Marland Kitchen docusate sodium (COLACE) 100 MG capsule Take 100 mg by mouth 2 (two) times daily.  Marland Kitchen levothyroxine (SYNTHROID, LEVOTHROID) 75 MCG tablet Take 75 mcg by mouth daily.  Marland Kitchen losartan-hydrochlorothiazide (HYZAAR) 100-12.5 MG per tablet takes 1/2 tablet  . metoprolol succinate (TOPROL-XL) 25 MG 24 hr tablet Take 1 tablet by mouth each morning and 1/2 tablet each evening.  . Multiple Vitamin (MULITIVITAMIN WITH MINERALS) TABS Take 1 tablet by mouth daily.  Marland Kitchen oxybutynin (DITROPAN-XL) 10 MG 24 hr tablet Take 10 mg by mouth daily.  . rosuvastatin (CRESTOR) 10 MG tablet Take 10 mg by mouth. Takes 1/2 tablet daily  . traMADol (ULTRAM) 50 MG tablet Take 50 mg by mouth 2 (two) times daily.           Sexual History:   History  Sexual Activity  . Sexual Activity: Not on file    Abuse/Trauma History: The patient denies any history of abuse or  trauma but has been having a great deal of difficulty adjusting to the death of her husband 61 years.  Psychiatric History:  The patient denies any prior psychiatric history but has been taking a generic Xanax the death of her husband she was tried fluoxetine as well. She has been asked to be taken off the alprazolam as it is possible. She does not want to develop any type of dependence on this medication.  Family Med/Psych History:  Family History  Problem Relation Age of Onset  . Diabetes Mother   . Hypertension Father   . Diabetes Father   . Heart attack Maternal Grandmother   . Heart attack Maternal Grandfather     Risk of Suicide/Violence: virtually non-existent the patient denies any suicidal or homicidal ideation.  Impression/DX:  The patient is had a great deal of difficulty adjusting to the death of her husband. She is taking care of both her parents until they died and took care of her husband who is 7 years old when he got sick. However, before he got sick he had been responsible in taking care of all of the major decisions in the household. After he passed away, the patient was placed with an overwhelming amount of decisions and responsibilities that she's not been able to cope with. This is raised a great deal of anxiety and stress. Her difficulties are now well past the 6 month threshold and the depression/ depressive features would go beyond simple bereavement  and now look at issues related to a depressive disorder.  Disposition/Plan:  We will set the patient up for individual psychotherapeutic interventions build better coping skills to deal with her current situations as well as skills to better manage her anxiety and depressive features.  Diagnosis:    Axis I:  Generalized anxiety disorder  Depressive disorder, not elsewhere classified

## 2013-06-22 ENCOUNTER — Encounter: Payer: Self-pay | Admitting: Cardiology

## 2013-06-22 NOTE — Assessment & Plan Note (Addendum)
EKG in office today demonstrates atrial fibrillation w/ RVR. Ventricular rate is fluctuating in the 120s-140s. I believe this is the cause of her symptoms. In the office today, she endorses palpitations but denies any other symptoms currently. Her BP is stable at 134/78 and she shows no signs of acute heart failure, thus I don't feels she requires hospitalization at this point. However, she needs better rate control. She has been taking Toprol BID. She has normal systolic function. Will initiate 120 mg of Cardizem today. Past office records also indicate that she has had some occasional mild bradycardia. I have ordered for her to be monitored by Cardionet since we are adding a second rate control agent. If bradycardia is detected, then she will need to be considered for a pacemaker so that we can treat her tachyrhythmias medically w/o limitations. Since she is on Synthroid, we will also check a TSH.

## 2013-06-22 NOTE — Progress Notes (Signed)
Patient ID: Denise Pugh, female   DOB: Feb 23, 1929, 80 y.o.   MRN: [id number]    06/22/2013 Denise Pugh   1929/03/02  [id number]  Primary Physicia Delphina Cahill, MD Primary Cardiologist: [name]  HPI:  The patient is a 80 y/o female, followed by [name], who presents to clinic with complaints of frequent palpations and dizziness. She has a h/o supraventricular tachycardia / Afib/flutter, that has been captured by Cardionet monitoring. She has been on BID Toprol XL for rate control. She had a stress test in May 2013 which showed breast attenuation artifact with a post stress ejection fraction of 70% without evidence for scar or ischemia.  A 2-D echo Doppler study on 09/15/2012 showed an ejection fraction of 60-65%. She did have grade 1 diastolic dysfunction and elevated LV filling pressures. The aortic valve was sclerotic without stenosis and there was mild/moderate central regurgitation, mild mitral regurgitation and mild tricuspid regurgitation with mild pulmonary hypertension with a PA estimate pressure at 47 mm. She also has hypothyroidism, treated with Synthroid.   She states that recently she has experienced more frequent episodes of palpitations with associated dizziness, however she denies syncope/ near syncope. No chest pain or SOB. She reports compliance with her Toprol XL. She has had increased anxiety recently, due to the death of her husband, and she feels that this may be contributing.     Current Outpatient Prescriptions  Medication Sig Dispense Refill  . acetaminophen (TYLENOL) 500 MG tablet Take 500 mg by mouth every 6 (six) hours as needed. For fever      . ALPRAZolam (XANAX) 0.5 MG tablet Take 1 tablet by mouth every 8 (eight) hours.      . Calcium Carb-Cholecalciferol (CALCIUM 1000 + D PO) Take 1 tablet by mouth daily.      . Cholecalciferol (VITAMIN D) 2000 UNITS tablet Take 2,000 Units by mouth daily.      Marland Kitchen dexlansoprazole (DEXILANT) 60 MG capsule Take 60 mg by mouth 2  (two) times daily.      . diclofenac (VOLTAREN) 75 MG EC tablet Take 75 mg by mouth 2 (two) times daily.      Marland Kitchen docusate sodium (COLACE) 100 MG capsule Take 100 mg by mouth 2 (two) times daily.      Marland Kitchen levothyroxine (SYNTHROID, LEVOTHROID) 75 MCG tablet Take 75 mcg by mouth daily.      Marland Kitchen losartan-hydrochlorothiazide (HYZAAR) 100-12.5 MG per tablet takes 1/2 tablet      . metoprolol succinate (TOPROL-XL) 25 MG 24 hr tablet Take 1 tablet by mouth each morning and 1/2 tablet each evening.  135 tablet  2  . Multiple Vitamin (MULITIVITAMIN WITH MINERALS) TABS Take 1 tablet by mouth daily.      Marland Kitchen oxybutynin (DITROPAN-XL) 10 MG 24 hr tablet Take 10 mg by mouth daily.      . rosuvastatin (CRESTOR) 10 MG tablet Take 10 mg by mouth. Takes 1/2 tablet daily      . traMADol (ULTRAM) 50 MG tablet Take 50 mg by mouth 2 (two) times daily.       Marland Kitchen diltiazem (CARDIZEM CD) 120 MG 24 hr capsule Take 1 capsule (120 mg total) by mouth daily.  90 capsule  3   No current facility-administered medications for this visit.    Allergies  Allergen Reactions  . Penicillins Other (See Comments)    tingling  . Tetanus Toxoid Swelling    History   Social History  . Marital Status: Married  Spouse Name: N/A    Number of Children: N/A  . Years of Education: N/A   Occupational History  . Not on file.   Social History Main Topics  . Smoking status: Never Smoker   . Smokeless tobacco: Never Used  . Alcohol Use: No  . Drug Use: No  . Sexual Activity: Not on file   Other Topics Concern  . Not on file   Social History Narrative  . No narrative on file     Review of Systems: General: negative for chills, fever, night sweats or weight changes.  Cardiovascular: negative for chest pain, dyspnea on exertion, edema, orthopnea, palpitations, paroxysmal nocturnal dyspnea or shortness of breath Dermatological: negative for rash Respiratory: negative for cough or wheezing Urologic: negative for  hematuria Abdominal: negative for nausea, vomiting, diarrhea, bright red blood per rectum, melena, or hematemesis Neurologic: negative for visual changes, syncope, or dizziness All other systems reviewed and are otherwise negative except as noted above.    Blood pressure 134/78, pulse 155, height 5\' 3"  (1.6 m), weight 172 lb (78.019 kg).  General appearance: alert, cooperative and no distress Neck: no carotid bruit and no JVD Lungs: clear to auscultation bilaterally Heart: irregularly irregular rhythm and tachy Extremities: no LEE Pulses: 2+ and symmetric Skin: warm and dry Neurologic: Grossly normal  EKG Atrial Fibrillation; HR 140s  ASSESSMENT AND PLAN:   SUPRAVENTRICULAR TACHYCARDIA EKG in office today demonstrates atrial fibrillation w/ RVR. Ventricular rate is fluctuating in the 120s-140s. I believe this is the cause of her symptoms. In the office today, she endorses palpitations but denies any other symptoms currently. Her BP is stable at 134/78 and she shows no signs of acute heart failure, thus I don't feels she requires hospitalization at this point. However, she needs better rate control. She has been taking Toprol BID. She has normal systolic function. Will initiate 120 mg of Cardizem today. Past office records also indicate that she has had some occasional mild bradycardia. I have ordered for her to be monitored by Cardionet since we are adding a second rate control agent. If bradycardia is detected, then she will need to be considered for a pacemaker so that we can treat her tachyrhythmias medically w/o limitations. Since she is on Synthroid, we will also check a TSH.     PLAN  Will add 120 mg of Cardizem to her home dose of Toprol XL in an effort to better control her tachycardia. For precaution, I have ordered for her to be monitored by ambulatory cardiac heart monitor, as she has had mild bradycardia in the past. If significant bradycardia is detected, then we may have to  discontinue Cardizem, until she can be evaluated for potential PPM. I have instructed her to f/u with Cecilie Kicks, NP, at the end of the week on 06/23/13 for reassessment. [name] will also be in the office that day. The patient has been instructed to seek urgent medical evaluation, if she develops significant dizziness, chest pain, syncope or near syncope before her f/u appointment. She verbalized understanding.   Akaylah Lalley, BRITTAINYPA-C 06/22/2013 3:00 PM

## 2013-06-23 ENCOUNTER — Ambulatory Visit (INDEPENDENT_AMBULATORY_CARE_PROVIDER_SITE_OTHER): Payer: Medicare Other | Admitting: Cardiology

## 2013-06-23 VITALS — BP 170/74 | HR 55 | Ht 63.0 in | Wt 175.0 lb

## 2013-06-23 DIAGNOSIS — I480 Paroxysmal atrial fibrillation: Secondary | ICD-10-CM

## 2013-06-23 DIAGNOSIS — E039 Hypothyroidism, unspecified: Secondary | ICD-10-CM

## 2013-06-23 DIAGNOSIS — F4321 Adjustment disorder with depressed mood: Secondary | ICD-10-CM

## 2013-06-23 DIAGNOSIS — I4891 Unspecified atrial fibrillation: Secondary | ICD-10-CM

## 2013-06-23 NOTE — Progress Notes (Signed)
06/26/2013   PCP: Delphina Cahill, MD   Chief Complaint  Patient presents with  . Follow-up     one week visit    Primary Cardiologist: [name]  HPI: 80 y/o female, followed by [name], who presented to clinic 06/19/13  w ith complaints of frequent palpations and dizziness. She has a h/o supraventricular tachycardia / Afib/flutter, that has been captured by Cardionet monitoring. She has been on BID Toprol XL for rate control. She had a stress test in May 2013 which showed breast attenuation artifact with a post stress ejection fraction of 70% without evidence for scar or ischemia. A 2-D echo Doppler study on 09/15/2012 showed an ejection fraction of 60-65%. She did have grade 1 diastolic dysfunction and elevated LV filling pressures. The aortic valve was sclerotic without stenosis and there was mild/moderate central regurgitation, mild mitral regurgitation and mild tricuspid regurgitation with mild pulmonary hypertension with a PA estimate pressure at 47 mm. She also has hypothyroidism, treated with Synthroid.   Recently she has experienced more frequent episodes of palpitations with associated dizziness, however she denies syncope/ near syncope. No chest pain or SOB. She reports compliance with her Toprol XL. She has had increased anxiety recently, due to the death of her husband, and she feels that this may be contributing.   She was placed on cardizem and event monitor for a fib burden.  She has converted back to SB rate of  55.  No chest pain, no SOB.  She is tearful today due to loss of her husband and the fact that she is not scheduled with [name].  We discussed her loss.  I also asked [name] to come in briefly to let her know he was aware of her issues.  She did complain of itching since starting cardizem, but once [name] came in she stated she was ok that it was minimal.      Allergies  Allergen Reactions  . Penicillins Other (See Comments)    tingling  .  Tetanus Toxoid Swelling    Current Outpatient Prescriptions  Medication Sig Dispense Refill  . acetaminophen (TYLENOL) 500 MG tablet Take 500 mg by mouth every 6 (six) hours as needed. For fever      . ALPRAZolam (XANAX) 0.5 MG tablet Take 1 tablet by mouth every 8 (eight) hours.      . Calcium Carb-Cholecalciferol (CALCIUM 1000 + D PO) Take 1 tablet by mouth daily.      . Cholecalciferol (VITAMIN D) 2000 UNITS tablet Take 2,000 Units by mouth daily.      Marland Kitchen dexlansoprazole (DEXILANT) 60 MG capsule Take 60 mg by mouth 2 (two) times daily.      . diclofenac (VOLTAREN) 75 MG EC tablet Take 75 mg by mouth 2 (two) times daily.      Marland Kitchen diltiazem (CARDIZEM CD) 120 MG 24 hr capsule Take 1 capsule (120 mg total) by mouth daily.  90 capsule  3  . docusate sodium (COLACE) 100 MG capsule Take 100 mg by mouth 2 (two) times daily.      Marland Kitchen levothyroxine (SYNTHROID, LEVOTHROID) 75 MCG tablet Take 75 mcg by mouth daily.      Marland Kitchen losartan-hydrochlorothiazide (HYZAAR) 100-12.5 MG per tablet takes 1/2 tablet      . metoprolol succinate (TOPROL-XL) 25 MG 24 hr tablet Take 1 tablet by mouth each morning and 1/2 tablet each evening.  135 tablet  2  . Multiple  Vitamin (MULITIVITAMIN WITH MINERALS) TABS Take 1 tablet by mouth daily.      Marland Kitchen oxybutynin (DITROPAN-XL) 10 MG 24 hr tablet Take 10 mg by mouth daily.      . rosuvastatin (CRESTOR) 10 MG tablet Take 10 mg by mouth. Takes 1/2 tablet daily      . traMADol (ULTRAM) 50 MG tablet Take 50 mg by mouth 2 (two) times daily.        No current facility-administered medications for this visit.    Past Medical History  Diagnosis Date  . Thyroid disease   . Arthritis   . Irregular heart beat   . Arrhythmia     HX of SVT. CARDIONET MONITOR 07/15/12 TO 08/13/12  . Sleep apnea     SLEEP STUDY-[facility]  . Hypertension 08/25/10    ECHO-EF 60-65%  . Hyperlipidemia 08/06/11    lexiscan myoview-normal. Due to severe attenuation the study specificity and  sensitivty are deminished.    Past Surgical History  Procedure Laterality Date  . Knee arthroscopy    . Cardiac catheterization  07/06/01    spade-like configuration    HBZ:JIRCVEL:FY colds or fevers, no weight changes Skin:no rashes or ulcers, minimal pruritis  HEENT:no blurred vision, no congestion CV:see HPI PUL:see HPI GI:no diarrhea constipation or melena, no indigestion GU:no hematuria, no dysuria MS:no joint pain, no claudication Neuro:no syncope, no lightheadedness Endo:no diabetes, no thyroid disease Psych:  Continues to be tearful with loss of her husband  PHYSICAL EXAM BP 170/74  Pulse 55  Ht 5\' 3"  (1.6 m)  Wt 175 lb (79.379 kg)  BMI 31.01 kg/m2 General:Pleasant affect, NAD Skin:Warm and dry, brisk capillary refill HEENT:normocephalic, sclera clear, mucus membranes moist Neck:supple, no JVD, no bruits  Heart:S1S2 RRR without murmur, gallup, rub or click Lungs:clear without rales, rhonchi, or wheezes BOF:BPZW, non tender, + BS, do not palpate liver spleen or masses Ext:no lower ext edema, 2+ pedal pulses, 2+ radial pulses Neuro:alert and oriented, MAE, follows commands, + facial symmetry  EKG:S brady rate of 55 no acute changes except now back in SR  ASSESSMENT AND PLAN PAF (paroxysmal atrial fibrillation) Pt was seen earlier this week and found to be in a fib with RVR.  Back today after adding cardizem po and is now in SB at 26.  She is not on anticoagulation.  She is CHA2DS2-VASc score of 4.  She has event monitor in place if anymore a fib then would recommend anticoagulation on Eliquis.  Currently stable on BB and CCB.  Hx of bradycardia but she is at 79.  She will follow up with [name].  Continue event monitor      Grieving Easily tearful.  Very upset that she was not seeing [name].  I believe with her husband's death she has too many changes and with different providers she is becoming anxious.  [name] was able to come into room and reassure her  that he was aware of her irregular HR.  She felt much better at end of visit.   Hypothyroidism Checked in Nov. And was stable.   HTN elevated with her anxiety/depression and worried she would not see [name].

## 2013-06-23 NOTE — Patient Instructions (Signed)
Follow up with [name] in 4-5 weeks.  Must be seen by [name]  Call if any further problems in the meantime

## 2013-06-26 ENCOUNTER — Encounter: Payer: Self-pay | Admitting: Cardiology

## 2013-06-26 DIAGNOSIS — I480 Paroxysmal atrial fibrillation: Secondary | ICD-10-CM | POA: Insufficient documentation

## 2013-06-26 NOTE — Assessment & Plan Note (Signed)
Checked in Nov. And was stable.

## 2013-06-26 NOTE — Assessment & Plan Note (Signed)
Easily tearful.  Very upset that she was not seeing [name].  I believe with her husband's death she has too many changes and with different providers she is becoming anxious.  [name] was able to come into room and reassure her that he was aware of her irregular HR.  She felt much better at end of visit.

## 2013-06-26 NOTE — Assessment & Plan Note (Addendum)
Pt was seen earlier this week and found to be in a fib with RVR.  Back today after adding cardizem po and is now in SB at 35.  She is not on anticoagulation.  She is CHA2DS2-VASc score of 4.  She has event monitor in place if anymore a fib then would recommend anticoagulation on Eliquis.  Currently stable on BB and CCB.  Hx of bradycardia but she is at 82.  She will follow up with [name].  Continue event monitor

## 2013-07-02 ENCOUNTER — Telehealth: Payer: Self-pay | Admitting: Physician Assistant

## 2013-07-02 NOTE — Telephone Encounter (Signed)
Pt needs appt this week - while TK in the office, if possible this week- She was upset not to see him on last visit.  Keep May appt wth Dr.Kelly

## 2013-07-02 NOTE — Telephone Encounter (Signed)
Spoke to patient.  She is doing well.  Her monitor broke today and she is shipping it back   i told her the office will call her tomorrow. Richardson Dopp, PA-C   07/02/2013 12:43 PM

## 2013-07-02 NOTE — Telephone Encounter (Signed)
Denise Pugh is a 80 y.o. female with a history of paroxysmal a fibrillation and HTN (CHADS2-VASc=4). She was recently seen in the [facility] office with parox atrial fibrillation noted on an event monitor. Rate controlling medications were adjusted. She was back in NSR.  It was felt that if she had recurrent atrial fibrillation, she would need anticoagulation. I was contacted by [facility] today. The patient had an episode of atrial fibrillation last night. Maximum heart rate was 170. She had a run of nonsustained ventricular tachycardia this morning (9 beats). She has an echocardiogram from 11/2012 that demonstrated normal LV function. No symptoms were reported last night or today. I left a message for the patient to contact us today. I will also send my note to the [facility] office to get her in for followup tomorrow as she will likely need anticoagulation therapy started. Richardson Dopp, PA-C   07/02/2013 9:38 AM

## 2013-07-03 ENCOUNTER — Ambulatory Visit (INDEPENDENT_AMBULATORY_CARE_PROVIDER_SITE_OTHER): Payer: Medicare Other | Admitting: Physician Assistant

## 2013-07-03 ENCOUNTER — Encounter: Payer: Self-pay | Admitting: Physician Assistant

## 2013-07-03 VITALS — BP 150/80 | HR 57 | Ht 63.0 in | Wt 174.0 lb

## 2013-07-03 DIAGNOSIS — I480 Paroxysmal atrial fibrillation: Secondary | ICD-10-CM

## 2013-07-03 DIAGNOSIS — I10 Essential (primary) hypertension: Secondary | ICD-10-CM

## 2013-07-03 DIAGNOSIS — I4891 Unspecified atrial fibrillation: Secondary | ICD-10-CM

## 2013-07-03 MED ORDER — APIXABAN 5 MG PO TABS: 5.0000 mg | ORAL_TABLET | Freq: Two times a day (BID) | ORAL | Status: DC

## 2013-07-03 NOTE — Patient Instructions (Signed)
1.  IF you become dizzy, or short of breath, take your blood pressure.  If it is over 110/60, and your heart rate is very fast(over 100), take an extra half of toprol(12.5 mg.) once.  Don't hesitate to call if you are unsure what to do.  2.  Follow up with [name] as scheduled.

## 2013-07-03 NOTE — Progress Notes (Signed)
Date:  07/03/2013   ID:  Denise Pugh, DOB 07/30/1928, MRN [id number]  PCP:  Delphina Cahill, MD  Primary Cardiologist:  Claiborne Billings     History of Present Illness: Denise Pugh is a 80 y.o. female 80 y/o female, followed by [name], who presented to clinic 06/19/13 with complaints of frequent palpations and dizziness. She has a h/o supraventricular tachycardia / Afib/flutter, that has been captured by Cardionet monitoring. She has been on BID Toprol XL for rate control. She had a stress test in May 2013 which showed breast attenuation artifact with a post stress ejection fraction of 70% without evidence for scar or ischemia. A 2-D echo Doppler study on 09/15/2012 showed an ejection fraction of 60-65%. She did have grade 1 diastolic dysfunction and elevated LV filling pressures. The aortic valve was sclerotic without stenosis and there was mild/moderate central regurgitation, mild mitral regurgitation and mild tricuspid regurgitation with mild pulmonary hypertension with a PA estimate pressure at 47 mm. She also has hypothyroidism, treated with Synthroid.  Recently she has experienced more frequent episodes of palpitations with associated dizziness, however she denies syncope/ near syncope. No chest pain or SOB. She reports compliance with her Toprol XL. She has had increased anxiety recently, due to the death of her husband, and she feels that this may be contributing. She was placed on cardizem and event monitor for a fib burden. She has converted back to SB rate of 55.   Patient presents today after having a short while of rapid A. fib the rate of 171 beats per minute. This was caught by the [place] monitor. The patient reports she was sleeping at the time and did not get awakened or feel any symptoms after she did awake. She does report a ruptured left Achilles tendon which does cause her some swelling in that leg  The patient currently denies nausea, vomiting, fever, chest pain, shortness of breath,  orthopnea, dizziness, PND, cough, congestion, abdominal pain, hematochezia, melena, claudication.  Wt Readings from Last 3 Encounters:  07/03/13 174 lb (78.926 kg)  06/23/13 175 lb (79.379 kg)  06/19/13 172 lb (78.019 kg)     Past Medical History  Diagnosis Date  . Thyroid disease   . Arthritis   . Irregular heart beat   . Arrhythmia     HX of SVT. CARDIONET MONITOR 07/15/12 TO 08/13/12  . Sleep apnea     SLEEP STUDY-[facility]  . Hypertension 08/25/10    ECHO-EF 60-65%  . Hyperlipidemia 08/06/11    lexiscan myoview-normal. Due to severe attenuation the study specificity and sensitivty are deminished.    Current Outpatient Prescriptions  Medication Sig Dispense Refill  . acetaminophen (TYLENOL) 500 MG tablet Take 500 mg by mouth every 6 (six) hours as needed. For fever      . ALPRAZolam (XANAX) 0.5 MG tablet Take 1 tablet by mouth every 8 (eight) hours.      . Calcium Carb-Cholecalciferol (CALCIUM 1000 + D PO) Take 1 tablet by mouth daily.      . Cholecalciferol (VITAMIN D) 2000 UNITS tablet Take 2,000 Units by mouth daily.      Marland Kitchen dexlansoprazole (DEXILANT) 60 MG capsule Take 60 mg by mouth 2 (two) times daily.      . diclofenac (VOLTAREN) 75 MG EC tablet Take 75 mg by mouth 2 (two) times daily.      Marland Kitchen diltiazem (CARDIZEM CD) 120 MG 24 hr capsule Take 1 capsule (120 mg total) by  mouth daily.  90 capsule  3  . docusate sodium (COLACE) 100 MG capsule Take 100 mg by mouth 2 (two) times daily.      Marland Kitchen levothyroxine (SYNTHROID, LEVOTHROID) 75 MCG tablet Take 75 mcg by mouth daily.      Marland Kitchen losartan-hydrochlorothiazide (HYZAAR) 100-12.5 MG per tablet takes 1/2 tablet      . metoprolol succinate (TOPROL-XL) 25 MG 24 hr tablet Take 1 tablet by mouth each morning and 1/2 tablet each evening.  135 tablet  2  . Multiple Vitamin (MULITIVITAMIN WITH MINERALS) TABS Take 1 tablet by mouth daily.      Marland Kitchen oxybutynin (DITROPAN-XL) 10 MG 24 hr tablet Take 10 mg by mouth daily.      .  rosuvastatin (CRESTOR) 10 MG tablet Take 10 mg by mouth. Takes 1/2 tablet daily      . traMADol (ULTRAM) 50 MG tablet Take 50 mg by mouth 2 (two) times daily.       Marland Kitchen apixaban (ELIQUIS) 5 MG TABS tablet Take 1 tablet (5 mg total) by mouth 2 (two) times daily.  60 tablet  11   No current facility-administered medications for this visit.    Allergies:    Allergies  Allergen Reactions  . Penicillins Other (See Comments)    tingling  . Tetanus Toxoid Swelling    Social History:  The patient  reports that she has never smoked. She has never used smokeless tobacco. She reports that she does not drink alcohol or use illicit drugs.   Family history:   Family History  Problem Relation Age of Onset  . Diabetes Mother   . Hypertension Father   . Diabetes Father   . Heart attack Maternal Grandmother   . Heart attack Maternal Grandfather     ROS:  Please see the history of present illness.  All other systems reviewed and negative.   PHYSICAL EXAM: VS:  BP 150/80  Pulse 57  Ht 5\' 3"  (1.6 m)  Wt 174 lb (78.926 kg)  BMI 30.83 kg/m2 Obese, well developed, in no acute distress HEENT: Pupils are equal round react to light accommodation extraocular movements are intact.  Neck: no JVDNo cervical lymphadenopathy. Cardiac: Regular rate and rhythm without murmurs rubs or gallops. Lungs:  clear to auscultation bilaterally, no wheezing, rhonchi or rales Ext: 1+ left lower extremity edema.  2+ radial and dorsalis pedis pulses. Skin: warm and dry Neuro:  Grossly normal  EKG:  Sinus bradycardia rate 57 beats per minute     ASSESSMENT AND PLAN:  Problem List Items Addressed This Visit   HYPERTENSION     Under a lot of stress with recent loss of her husband which may be contributing to her BP.  She will follow up on May 4    Relevant Medications      apixaban (ELIQUIS) tablet   PAF (paroxysmal atrial fibrillation)     The patient presented today after having a run of rapid A. fib a heart rate  of 171. This episode lasted 8 seconds. She's also had several shorter runs lasting only a couple seconds. This may be a bit and some ventricular tachycardia nonsustained. She is currently being treated with a diltiazem 120 mg daily and Toprol 25 mg daily. I am reluctant to increase any of these medications given her underlying heart rate is sinus bradycardia in the 50s.  She's currently not anticoagulated but will need to be started on Eliquis 5mg  BID CHADSVASC score is 4.  She will need  to have her SCr checked.  The last one recorded was 07/2012 and under 1.5.      Relevant Medications      apixaban (ELIQUIS) tablet    Other Visit Diagnoses   Atrial fibrillation    -  Primary    Relevant Medications       apixaban (ELIQUIS) tablet    Other Relevant Orders       EKG 12-Lead

## 2013-07-03 NOTE — Assessment & Plan Note (Signed)
The patient presented today after having a run of rapid A. fib a heart rate of 171. This episode lasted 8 seconds. She's also had several shorter runs lasting only a couple seconds. This may be a bit and some ventricular tachycardia nonsustained. She is currently being treated with a diltiazem 120 mg daily and Toprol 25 mg daily. I am reluctant to increase any of these medications given her underlying heart rate is sinus bradycardia in the 50s.  She's currently not anticoagulated but will need to be started on Eliquis 5mg  BID CHADSVASC score is 4.  She will need to have her SCr checked.  The last one recorded was 07/2012 and under 1.5.

## 2013-07-03 NOTE — Telephone Encounter (Signed)
Pt seen

## 2013-07-03 NOTE — Assessment & Plan Note (Signed)
Under a lot of stress with recent loss of her husband which may be contributing to her BP.  She will follow up on May 4

## 2013-07-05 ENCOUNTER — Ambulatory Visit (INDEPENDENT_AMBULATORY_CARE_PROVIDER_SITE_OTHER): Payer: No Typology Code available for payment source | Admitting: Psychology

## 2013-07-05 DIAGNOSIS — F411 Generalized anxiety disorder: Secondary | ICD-10-CM

## 2013-07-05 DIAGNOSIS — F329 Major depressive disorder, single episode, unspecified: Secondary | ICD-10-CM

## 2013-07-05 DIAGNOSIS — F3289 Other specified depressive episodes: Secondary | ICD-10-CM

## 2013-07-06 ENCOUNTER — Encounter (HOSPITAL_COMMUNITY): Payer: Self-pay | Admitting: Psychology

## 2013-07-06 NOTE — Progress Notes (Signed)
PROGRESS NOTE  Patient:  Denise Pugh   DOB: 09/20/1928  MR Number: [id number]  Location: [facility] [address] [place] [address] Dept: [phone]  Start: 4 PM End: 5 PM  Provider/Observer:     Edgardo Roys PSYD  Chief Complaint:      Chief Complaint  Patient presents with  . Anxiety  . Depression    Reason For Service:    The patient was self referred for psychotherapeutic interventions. The patient is an 80 year old Caucasian female that has had a lot of issues to deal with recently primarily dealing with grief, responsibilities after her husband died and feeling very lonely and isolated. The patient reports that her husband who is 23 years old got very sick in may of 2014 and then get declining in health. Eventually he developed significant dementia and he died in 09-26-12. After his death she was faced with an overwhelming amount of decisions and responsibilities that she is not familiar with or responsible for coping with past. The patient reports that she has been very lonely and worried/concerned about not being able to financially manage her life. She has been having difficulty keeping up with the maintenance and upkeep of her home not being able to physically do house maintenance. The patient reports that she is crying often and simply feels frozen in her ability to cope and manage. She reports that the symptoms started after her husband of 45 years passed away in September 26, 2012. She reports that she was never totally responsible for making any ultimate decisions and this is been extremely difficult for her.   Interventions Strategy:  Cognitive/behavioral psychotherapeutic interventions  Participation Level:   Active  Participation Quality:  Appropriate      Behavioral Observation:  Well Groomed, Alert, and Appropriate.   Current Psychosocial Factors: The patient  reports that she has still been struggling with the loss of her husband in coping with all of the things she has to deal with. However, she has been having some cardiovascular issues and has had numerous appointments recently and this is led her to worry about her health status and trying to cope with and deal with what she is doing.  Content of Session:   Reviewed current symptoms and continued work on therapeutic interventions around anxiety and depression as well as bereavement and loss.  Current Status:   The patient reports that she continues to experience a great deal of anxiety and depressive symptoms that go beyond simple bereavement. Stable  Patient Progress:   Stable  Target Goals:   Target goals include reducing the intensity, frequency, and duration of symptoms of depression including feelings of helplessness and hopelessness, social isolation, excessive worrying, intrusive thinking with fears, as well as objective feature such as sleep, appetite, and behavioral activities.  Last Reviewed:   07/05/2013  Goals Addressed Today:     today we worked specifically on better coping skills around her intrusive thinking and fear around both her health status as well as managing her life without her husband.  Impression/Diagnosis:  The patient is had a great deal of difficulty adjusting to the death of her husband. She is taking care of both her parents until they died and took care of her husband who is 66 years old when he got sick. However, before he got sick he had been responsible in taking care of all of the major decisions in the  household. After he passed away, the patient was placed with an overwhelming amount of decisions and responsibilities that she's not been able to cope with. This is raised a great deal of anxiety and stress. Her difficulties are now well past the 6 month threshold and the depression/ depressive features would go beyond simple bereavement and now look at issues related  to a depressive disorder.   Diagnosis:    Axis I: Generalized anxiety disorder  Depressive disorder, not elsewhere classified   Yilin Weedon R, PsyD 07/06/2013

## 2013-07-12 ENCOUNTER — Telehealth: Payer: Self-pay | Admitting: *Deleted

## 2013-07-12 NOTE — Telephone Encounter (Signed)
Pt called back.  Stated BP 115/77 (R) 124/112 (L).  RN asked pt to recheck L arm.  BP: 115/78 now.    Advice  Stay hydrated; increase water intake if needed  Continue current med regimen  Lie down and elevate LEs if dizzy or lightheaded   Keep appt on 5.4.15  [name] will be notified for further instructions  Pt verbalized understanding and agreed w/ plan.   Message forwarded to [name] and this note place on cart for review.

## 2013-07-12 NOTE — Telephone Encounter (Signed)
Pt does not have transportation, which is why she prefers to find BP monitor.

## 2013-07-12 NOTE — Telephone Encounter (Signed)
Pt was calling in regards to her medication. She is having issues with all her medications and she would like a call back  TK

## 2013-07-12 NOTE — Telephone Encounter (Signed)
Returned call and pt verified x 2.  Pt with concerns about Eliquis and Cardizem.  Pt c/o feeling tired and weak since she started taking them.  Stated she feels like she's getting worse.  Pt denied checking BP b/c she does not know where her monitor is.  Pt stated several times that she is concerned b/c she lives at home.    Pt's symptoms are likely r/t diltiazem and/or metoprolol.  RN offered appt for BP check and pt declined.  Stated she will look for monitor and call back.  Pt advised to sit for at least 10 mins before checking BP w/ legs uncrossed and feet flat on the floor.  Pt verbalized understanding and agreed w/ plan.  Pt agreed to call back either way, for an appt or w/ BP reading.

## 2013-07-13 NOTE — Telephone Encounter (Signed)
agree

## 2013-07-14 NOTE — Telephone Encounter (Signed)
F/u call to pt.  Pt stated she feels about the same and has increased water intake.  Stated she just doesn't have much energy.  RN offered her a sooner appt w/ an Extender and pt declined.  Stated he wants to see [name] b/c she doesn't like other people changing her meds.  Pt informed RN can offer her a sooner appt w/ an Extender when [name] is here, but that will be next Thursday and Friday.  Pt's f/u appt is on Monday, May 4th.  Pt stated she will just keep her appt on the 4th and call back if anything changes for a sooner appt.

## 2013-07-17 ENCOUNTER — Telehealth: Payer: Self-pay | Admitting: Cardiology

## 2013-07-17 ENCOUNTER — Telehealth: Payer: Self-pay | Admitting: Cardiovascular Disease

## 2013-07-17 NOTE — Telephone Encounter (Signed)
Noted  

## 2013-07-17 NOTE — Telephone Encounter (Signed)
She want you to know she is not coming tomorrow to see Denise Pugh. She said she would just see Dr Claiborne Billings next week.

## 2013-07-17 NOTE — Telephone Encounter (Signed)
Please call,having problems with one of these medicine,Eliquis or Diltiazem. It is really making her sick.

## 2013-07-17 NOTE — Telephone Encounter (Signed)
Returned call and pt verified x 2.  Pt informed message received and asked if she is still having same symptoms as last week.  Pt stated she is and is very tired.  Pt agreed to seen an Extender and appt scheduled for tomorrow at 2pm w/ Lyda Jester, PA-C.  Pt advised to go to ER for any worsening symptoms and to avoid driving if dizzy or not feeling well.  Pt verbalized understanding and agreed w/ plan.   Pt wanted to keep her appt w/ [name] on next Monday and appt was not cancelled.

## 2013-07-18 ENCOUNTER — Inpatient Hospital Stay (HOSPITAL_COMMUNITY)
Admission: AD | Admit: 2013-07-18 | Discharge: 2013-07-21 | DRG: 244 | Disposition: A | Discharge status: Home / Self Care | Payer: Medicare Other | Source: Ambulatory Visit | Attending: Cardiovascular Disease | Admitting: Cardiovascular Disease

## 2013-07-18 ENCOUNTER — Ambulatory Visit: Payer: Self-pay | Admitting: Cardiology

## 2013-07-18 ENCOUNTER — Ambulatory Visit (INDEPENDENT_AMBULATORY_CARE_PROVIDER_SITE_OTHER): Payer: Medicare Other | Admitting: Cardiology

## 2013-07-18 ENCOUNTER — Encounter (HOSPITAL_COMMUNITY): Payer: Self-pay

## 2013-07-18 ENCOUNTER — Encounter: Payer: Self-pay | Admitting: Cardiology

## 2013-07-18 VITALS — BP 122/78 | HR 147 | Ht 63.0 in | Wt 171.9 lb

## 2013-07-18 DIAGNOSIS — Z887 Allergy status to serum and vaccine status: Secondary | ICD-10-CM

## 2013-07-18 DIAGNOSIS — E785 Hyperlipidemia, unspecified: Secondary | ICD-10-CM | POA: Diagnosis present

## 2013-07-18 DIAGNOSIS — F3289 Other specified depressive episodes: Secondary | ICD-10-CM | POA: Diagnosis present

## 2013-07-18 DIAGNOSIS — F329 Major depressive disorder, single episode, unspecified: Secondary | ICD-10-CM | POA: Diagnosis present

## 2013-07-18 DIAGNOSIS — R001 Bradycardia, unspecified: Secondary | ICD-10-CM | POA: Diagnosis present

## 2013-07-18 DIAGNOSIS — I4891 Unspecified atrial fibrillation: Secondary | ICD-10-CM

## 2013-07-18 DIAGNOSIS — I10 Essential (primary) hypertension: Secondary | ICD-10-CM | POA: Diagnosis present

## 2013-07-18 DIAGNOSIS — Z880 Allergy status to penicillin: Secondary | ICD-10-CM

## 2013-07-18 DIAGNOSIS — I480 Paroxysmal atrial fibrillation: Secondary | ICD-10-CM

## 2013-07-18 DIAGNOSIS — T50905A Adverse effect of unspecified drugs, medicaments and biological substances, initial encounter: Secondary | ICD-10-CM

## 2013-07-18 DIAGNOSIS — K5900 Constipation, unspecified: Secondary | ICD-10-CM | POA: Diagnosis not present

## 2013-07-18 DIAGNOSIS — Z7901 Long term (current) use of anticoagulants: Secondary | ICD-10-CM

## 2013-07-18 DIAGNOSIS — E039 Hypothyroidism, unspecified: Secondary | ICD-10-CM | POA: Diagnosis present

## 2013-07-18 DIAGNOSIS — I495 Sick sinus syndrome: Principal | ICD-10-CM

## 2013-07-18 DIAGNOSIS — Z950 Presence of cardiac pacemaker: Secondary | ICD-10-CM | POA: Diagnosis not present

## 2013-07-18 HISTORY — DX: Shortness of breath: R06.02

## 2013-07-18 HISTORY — DX: Gastro-esophageal reflux disease without esophagitis: K21.9

## 2013-07-18 HISTORY — DX: Hypothyroidism, unspecified: E03.9

## 2013-07-18 LAB — COMPREHENSIVE METABOLIC PANEL
ALK PHOS: 119 U/L — AB (ref 39–117)
ALT: 31 U/L (ref 0–35)
AST: 26 U/L (ref 0–37)
Albumin: 3.6 g/dL (ref 3.5–5.2)
BUN: 23 mg/dL (ref 6–23)
CO2: 21 mEq/L (ref 19–32)
Calcium: 9.2 mg/dL (ref 8.4–10.5)
Chloride: 102 mEq/L (ref 96–112)
Creatinine, Ser: 1.12 mg/dL — ABNORMAL HIGH (ref 0.50–1.10)
GFR calc non Af Amer: 44 mL/min — ABNORMAL LOW (ref >90)
GFR, EST AFRICAN AMERICAN: 52 mL/min — AB (ref >90)
GLUCOSE: 92 mg/dL (ref 70–99)
POTASSIUM: 4.3 meq/L (ref 3.7–5.3)
Sodium: 139 mEq/L (ref 137–147)
TOTAL PROTEIN: 7 g/dL (ref 6.0–8.3)
Total Bilirubin: 0.3 mg/dL (ref 0.3–1.2)

## 2013-07-18 LAB — CBC WITH DIFFERENTIAL/PLATELET
Basophils Absolute: 0 10*3/uL (ref 0.0–0.1)
Basophils Relative: 0 % (ref 0–1)
EOS ABS: 0.5 10*3/uL (ref 0.0–0.7)
EOS PCT: 6 % — AB (ref 0–5)
HEMATOCRIT: 38.3 % (ref 36.0–46.0)
Hemoglobin: 12.5 g/dL (ref 12.0–15.0)
Lymphocytes Relative: 33 % (ref 12–46)
Lymphs Abs: 2.9 10*3/uL (ref 0.7–4.0)
MCH: 28.2 pg (ref 26.0–34.0)
MCHC: 32.6 g/dL (ref 30.0–36.0)
MCV: 86.5 fL (ref 78.0–100.0)
MONO ABS: 0.6 10*3/uL (ref 0.1–1.0)
Monocytes Relative: 6 % (ref 3–12)
Neutro Abs: 4.9 10*3/uL (ref 1.7–7.7)
Neutrophils Relative %: 55 % (ref 43–77)
Platelets: 247 10*3/uL (ref 150–400)
RBC: 4.43 MIL/uL (ref 3.87–5.11)
RDW: 16 % — AB (ref 11.5–15.5)
WBC: 8.9 10*3/uL (ref 4.0–10.5)

## 2013-07-18 LAB — PRO B NATRIURETIC PEPTIDE: Pro B Natriuretic peptide (BNP): 5333 pg/mL — ABNORMAL HIGH (ref 0–450)

## 2013-07-18 LAB — HEPARIN LEVEL (UNFRACTIONATED)

## 2013-07-18 LAB — APTT: aPTT: 36 seconds (ref 24–37)

## 2013-07-18 LAB — MAGNESIUM: MAGNESIUM: 2.2 mg/dL (ref 1.5–2.5)

## 2013-07-18 LAB — PROTIME-INR
INR: 1.59 — ABNORMAL HIGH (ref 0.00–1.49)
Prothrombin Time: 18.5 seconds — ABNORMAL HIGH (ref 11.6–15.2)

## 2013-07-18 LAB — TSH: TSH: 2.02 u[IU]/mL (ref 0.350–4.500)

## 2013-07-18 MED ORDER — LOSARTAN POTASSIUM-HCTZ 100-12.5 MG PO TABS: 0.5000 | ORAL_TABLET | Freq: Every day | ORAL | Status: DC

## 2013-07-18 MED ORDER — AMIODARONE HCL IN DEXTROSE 360-4.14 MG/200ML-% IV SOLN
60.0000 mg/h | INTRAVENOUS | Status: AC
  Administered 2013-07-18 – 2013-07-19 (×2): 60 mg/h via INTRAVENOUS
  Filled 2013-07-18: qty 200

## 2013-07-18 MED ORDER — HYDROCHLOROTHIAZIDE 10 MG/ML ORAL SUSPENSION
6.2500 mg | Freq: Every day | ORAL | Status: DC
  Administered 2013-07-19 – 2013-07-21 (×3): 6.25 mg via ORAL
  Filled 2013-07-18 (×3): qty 1.25

## 2013-07-18 MED ORDER — ACETAMINOPHEN 500 MG PO TABS: 500.0000 mg | ORAL_TABLET | Freq: Four times a day (QID) | ORAL | Status: DC | PRN

## 2013-07-18 MED ORDER — DOCUSATE SODIUM 100 MG PO CAPS
100.0000 mg | ORAL_CAPSULE | Freq: Two times a day (BID) | ORAL | Status: DC
  Administered 2013-07-18 – 2013-07-21 (×6): 100 mg via ORAL
  Filled 2013-07-18 (×7): qty 1

## 2013-07-18 MED ORDER — HEPARIN (PORCINE) IN NACL 100-0.45 UNIT/ML-% IJ SOLN
750.0000 [IU]/h | INTRAMUSCULAR | Status: DC
Start: 2013-07-18 — End: 2013-07-20
  Administered 2013-07-18: 850 [IU]/h via INTRAVENOUS
  Administered 2013-07-20: 750 [IU]/h via INTRAVENOUS
  Filled 2013-07-18 (×2): qty 250

## 2013-07-18 MED ORDER — ACETAMINOPHEN 325 MG PO TABS
650.0000 mg | ORAL_TABLET | ORAL | Status: DC | PRN
  Administered 2013-07-20 – 2013-07-21 (×3): 650 mg via ORAL
  Filled 2013-07-18 (×3): qty 2

## 2013-07-18 MED ORDER — ADULT MULTIVITAMIN W/MINERALS CH
1.0000 | ORAL_TABLET | Freq: Every day | ORAL | Status: DC
  Administered 2013-07-19 – 2013-07-21 (×3): 1 via ORAL
  Filled 2013-07-18 (×3): qty 1

## 2013-07-18 MED ORDER — VITAMIN D3 25 MCG (1000 UNIT) PO TABS
2000.0000 [IU] | ORAL_TABLET | Freq: Every day | ORAL | Status: DC
  Administered 2013-07-19 – 2013-07-21 (×3): 2000 [IU] via ORAL
  Filled 2013-07-18 (×3): qty 2

## 2013-07-18 MED ORDER — ATORVASTATIN CALCIUM 20 MG PO TABS
20.0000 mg | ORAL_TABLET | Freq: Every day | ORAL | Status: DC
  Administered 2013-07-18 – 2013-07-20 (×3): 20 mg via ORAL
  Filled 2013-07-18 (×4): qty 1

## 2013-07-18 MED ORDER — APIXABAN 5 MG PO TABS: 5.0000 mg | ORAL_TABLET | Freq: Two times a day (BID) | ORAL | Status: DC

## 2013-07-18 MED ORDER — ATORVASTATIN CALCIUM 10 MG PO TABS: 10.0000 mg | ORAL_TABLET | Freq: Every day | ORAL | Status: DC

## 2013-07-18 MED ORDER — AMIODARONE LOAD VIA INFUSION
150.0000 mg | Freq: Once | INTRAVENOUS | Status: AC
  Administered 2013-07-18: 150 mg via INTRAVENOUS
  Filled 2013-07-18: qty 83.34

## 2013-07-18 MED ORDER — ALPRAZOLAM 0.5 MG PO TABS: 0.5000 mg | ORAL_TABLET | Freq: Three times a day (TID) | ORAL | Status: DC

## 2013-07-18 MED ORDER — METOPROLOL SUCCINATE ER 25 MG PO TB24
25.0000 mg | ORAL_TABLET | Freq: Every day | ORAL | Status: DC
  Filled 2013-07-18: qty 1

## 2013-07-18 MED ORDER — AMIODARONE HCL IN DEXTROSE 360-4.14 MG/200ML-% IV SOLN
30.0000 mg/h | INTRAVENOUS | Status: DC
  Administered 2013-07-19 – 2013-07-21 (×3): 30 mg/h via INTRAVENOUS
  Filled 2013-07-18 (×11): qty 200

## 2013-07-18 MED ORDER — LOSARTAN POTASSIUM 50 MG PO TABS
50.0000 mg | ORAL_TABLET | Freq: Every day | ORAL | Status: DC
  Administered 2013-07-19 – 2013-07-21 (×3): 50 mg via ORAL
  Filled 2013-07-18 (×3): qty 1

## 2013-07-18 MED ORDER — SODIUM CHLORIDE 0.9 % IV SOLN
INTRAVENOUS | Status: DC
  Administered 2013-07-19: via INTRAVENOUS

## 2013-07-18 MED ORDER — DILTIAZEM HCL 100 MG IV SOLR
5.0000 mg/h | INTRAVENOUS | Status: DC
  Administered 2013-07-18: 5 mg/h via INTRAVENOUS
  Filled 2013-07-18: qty 100

## 2013-07-18 MED ORDER — DILTIAZEM LOAD VIA INFUSION
10.0000 mg | Freq: Once | INTRAVENOUS | Status: AC
  Administered 2013-07-18: 10 mg via INTRAVENOUS
  Filled 2013-07-18: qty 10

## 2013-07-18 MED ORDER — OXYBUTYNIN CHLORIDE ER 10 MG PO TB24
10.0000 mg | ORAL_TABLET | Freq: Every day | ORAL | Status: DC
  Administered 2013-07-18 – 2013-07-20 (×3): 10 mg via ORAL
  Filled 2013-07-18 (×4): qty 1

## 2013-07-18 MED ORDER — ALPRAZOLAM 0.5 MG PO TABS
0.5000 mg | ORAL_TABLET | Freq: Every day | ORAL | Status: DC
  Administered 2013-07-18 – 2013-07-20 (×3): 0.5 mg via ORAL
  Filled 2013-07-18 (×3): qty 1

## 2013-07-18 MED ORDER — DICLOFENAC SODIUM 75 MG PO TBEC
75.0000 mg | DELAYED_RELEASE_TABLET | Freq: Two times a day (BID) | ORAL | Status: DC
  Administered 2013-07-18: 75 mg via ORAL
  Filled 2013-07-18 (×3): qty 1

## 2013-07-18 MED ORDER — PANTOPRAZOLE SODIUM 40 MG PO TBEC
40.0000 mg | DELAYED_RELEASE_TABLET | Freq: Every day | ORAL | Status: DC
  Administered 2013-07-18 – 2013-07-21 (×4): 40 mg via ORAL
  Filled 2013-07-18 (×4): qty 1

## 2013-07-18 MED ORDER — ONDANSETRON HCL 4 MG/2ML IJ SOLN: 4.0000 mg | Freq: Four times a day (QID) | INTRAMUSCULAR | Status: DC | PRN

## 2013-07-18 MED ORDER — LEVOTHYROXINE SODIUM 75 MCG PO TABS
75.0000 ug | ORAL_TABLET | Freq: Every day | ORAL | Status: DC
  Administered 2013-07-19 – 2013-07-21 (×3): 75 ug via ORAL
  Filled 2013-07-18 (×4): qty 1

## 2013-07-18 MED ORDER — VITAMIN D 50 MCG (2000 UT) PO TABS: 2000.0000 [IU] | ORAL_TABLET | Freq: Every day | ORAL | Status: DC

## 2013-07-18 NOTE — H&P (Signed)
07/18/2013  Denise Pugh  [birth year]  [id number]  Primary Physicia Delphina Cahill, MD  Primary Cardiologist: [name]  HPI: The patient is a 80 y/o female, followed by [name], who presents to clinic with a complaint of extreme fatigue and slight dyspnea on exertion. She has been seen multiple times in clinic in the past month for Afib. She has a h/o supraventricular tachycardia / Afib/flutter, that has been captured by Cardionet monitoring. She has been on BID Toprol XL and Cardizem for rate control, and Eliquis for anticoagulation (CHADSVASC score of 4). She had a stress test in May 2013 which showed breast attenuation artifact with a post stress ejection fraction of 70% without evidence for scar or ischemia. A 2-D echo Doppler study on 09/15/2012 showed an ejection fraction of 60-65%. She did have grade 1 diastolic dysfunction and elevated LV filling pressures. The aortic valve was sclerotic without stenosis and there was mild/moderate central regurgitation, mild mitral regurgitation and mild tricuspid regurgitation with mild pulmonary hypertension with a PA estimate pressure at 47 mm. She also has hypothyroidism, treated with Synthroid.  An EKG in the office today demonstrates atrial fibrillation with RVR with rates in the 130s-140s. She is symptomatic, with significant fatigue and DOE. BP is stable a 122/78. It should also be noted that she has had some limitation with upward titration of her rate control agents due to bradycardia, and [name] has suggested the possibility of needing a PPM, if this continued to be a limiting factor. She was recently monitored by [place], to assess her AFib burden, and was found to have frequent bradycardia, with average HR of 51 bpm and lowest at 39 bpm.  Current Outpatient Prescriptions   Medication  Sig  Dispense  Refill   .  acetaminophen (TYLENOL) 500 MG tablet  Take 500 mg by mouth every 6 (six) hours as needed. For fever     .  ALPRAZolam (XANAX) 0.5 MG  tablet  Take 1 tablet by mouth every 8 (eight) hours.     Marland Kitchen  apixaban (ELIQUIS) 5 MG TABS tablet  Take 1 tablet (5 mg total) by mouth 2 (two) times daily.  60 tablet  11   .  Calcium Carb-Cholecalciferol (CALCIUM 1000 + D PO)  Take 1 tablet by mouth daily.     .  Cholecalciferol (VITAMIN D) 2000 UNITS tablet  Take 2,000 Units by mouth daily.     Marland Kitchen  dexlansoprazole (DEXILANT) 60 MG capsule  Take 60 mg by mouth 2 (two) times daily.     .  diclofenac (VOLTAREN) 75 MG EC tablet  Take 75 mg by mouth 2 (two) times daily.     Marland Kitchen  diltiazem (CARDIZEM CD) 120 MG 24 hr capsule  Take 1 capsule (120 mg total) by mouth daily.  90 capsule  3   .  docusate sodium (COLACE) 100 MG capsule  Take 100 mg by mouth 2 (two) times daily.     Marland Kitchen  levothyroxine (SYNTHROID, LEVOTHROID) 75 MCG tablet  Take 75 mcg by mouth daily.     Marland Kitchen  losartan-hydrochlorothiazide (HYZAAR) 100-12.5 MG per tablet  takes 1/2 tablet     .  metoprolol succinate (TOPROL-XL) 25 MG 24 hr tablet  Take 1 tablet by mouth each morning and 1/2 tablet each evening.  135 tablet  2   .  Multiple Vitamin (MULITIVITAMIN WITH MINERALS) TABS  Take 1 tablet by mouth daily.     Marland Kitchen  oxybutynin (DITROPAN-XL) 10 MG 24  hr tablet  Take 10 mg by mouth daily.     .  rosuvastatin (CRESTOR) 10 MG tablet  Take 10 mg by mouth. Takes 1/2 tablet daily     .  traMADol (ULTRAM) 50 MG tablet  Take 50 mg by mouth 2 (two) times daily.      No current facility-administered medications for this visit.    Allergies   Allergen  Reactions   .  Penicillins  Other (See Comments)     tingling   .  Tetanus Toxoid  Swelling    History    Social History   .  Marital Status:  Married     Spouse Name:  N/A     Number of Children:  N/A   .  Years of Education:  N/A    Occupational History   .  Not on file.    Social History Main Topics   .  Smoking status:  Never Smoker   .  Smokeless tobacco:  Never Used   .  Alcohol Use:  No   .  Drug Use:  No   .  Sexual Activity:  Not on  file    Other Topics  Concern   .  Not on file    Social History Narrative   .  No narrative on file   Review of Systems:  General: negative for chills, fever, night sweats or weight changes.  Cardiovascular: negative for chest pain, dyspnea on exertion, edema, orthopnea, palpitations, paroxysmal nocturnal dyspnea or shortness of breath  Dermatological: negative for rash  Respiratory: negative for cough or wheezing  Urologic: negative for hematuria  Abdominal: negative for nausea, vomiting, diarrhea, bright red blood per rectum, melena, or hematemesis  Neurologic: negative for visual changes, syncope, or dizziness  All other systems reviewed and are otherwise negative except as noted above.  Blood pressure 122/78, pulse 147, height 5\' 3"  (1.6 m), weight 171 lb 14.4 oz (77.973 kg).  General appearance: alert, cooperative and tearful  Lungs: clear to auscultation bilaterally  Heart: irregularly irregular rhythm and tachy  Extremities: trace -1+ LEE, L>R  Pulses: 2+ and symmetric  Skin: warm and dry  Neurologic: Grossly normal  EKG Afib w/ RVR, ventricular rate 147 bpm  ASSESSMENT AND PLAN:  Atrial fibrillation with RVR  Symptomatic but BP is stable at 122/78. Will plan for direct admit to [facility].  -Admit to telemetry  -Start IV Cardizem for rate control  -Continue BB  -Continue on Eliquis  -Check TSH, BMP and CBC  - consider DCCV if she fails to spontaneously convert to NSR  - ? Need for PPM, if continued rate control strategy is elected  PLAN [facility] has been notified of direct admission. Pt will report to admitting first, then will be placed on 3W for telemetry monitoring. Plan outlined above.  Brittainy SimmonsPA-C  07/18/2013  3:18 PM  Patient seen and examined. Agree with assessment and plan. Pt is well known to me with a remote history of SVT with moderate LVH with "spade like ventricle" documented in the past.  Recently she has had recurrent episodes of PAF. She was started on  eliquis 2 weeks ago after recurrent AF was noted on her cardionet monitor. She has had periods of sinus bradycardia and PAF up to 170, and also ?NSVT vs AF with aberrancy. She was admitted from office today with AF with rates in the 140's.  I believe patient has SSS and would benefit from permanent pacemaker and anti-arrythmic therapy.  Will hold eliquis for 2 days and plan permanent pacemaker later in the week. Will start IV amiodarone and  dc IV cardizem once started. Phamacy to dose heparin beginning tomorrow.    Troy Sine, MD, [facility] 07/18/2013 5:28 PM

## 2013-07-18 NOTE — Progress Notes (Signed)
ANTICOAGULATION CONSULT NOTE - Initial Consult  Pharmacy Consult for heparin Indication: atrial fibrillation  Allergies  Allergen Reactions  . Penicillins Other (See Comments)    tingling  . Tetanus Toxoid Swelling    Patient Measurements: Height: 5\' 3"  (160 cm) Weight: 171 lb 8.3 oz (77.8 kg) IBW/kg (Calculated) : 52.4 Heparin Dosing Weight: 70 kg  Vital Signs: Temp: 97.6 F (36.4 C) (04/28 1603) Temp src: Oral (04/28 1603) BP: 145/89 mmHg (04/28 1738) Pulse Rate: 135 (04/28 1603)  Labs: No results found for this basename: HGB, HCT, PLT, APTT, LABPROT, INR, HEPARINUNFRC, CREATININE, CKTOTAL, CKMB, TROPONINI,  in the last 72 hours  Estimated Creatinine Clearance: 31.1 ml/min (by C-G formula based on Cr of 1.38).   Medical History: Past Medical History  Diagnosis Date  . Thyroid disease   . Arthritis   . Irregular heart beat   . Arrhythmia     HX of SVT. CARDIONET MONITOR 07/15/12 TO 08/13/12  . Sleep apnea     SLEEP STUDY-[facility]  . Hypertension 08/25/10    ECHO-EF 60-65%  . Hyperlipidemia 08/06/11    lexiscan myoview-normal. Due to severe attenuation the study specificity and sensitivty are deminished.  . Shortness of breath   . Hypothyroidism   . GERD (gastroesophageal reflux disease)     Medications:  Scheduled:  . ALPRAZolam  0.5 mg Oral 3 times per day  . amiodarone  150 mg Intravenous Once  . atorvastatin  10 mg Oral q1800  . diclofenac  75 mg Oral BID  . docusate sodium  100 mg Oral BID  . levothyroxine  75 mcg Oral Daily  . losartan-hydrochlorothiazide  0.5 tablet Oral Daily  . metoprolol succinate  25 mg Oral Daily  . multivitamin with minerals  1 tablet Oral Daily  . oxybutynin  10 mg Oral Daily  . pantoprazole  40 mg Oral Daily  . Vitamin D  2,000 Units Oral Daily   Infusions:  . sodium chloride    . amiodarone (NEXTERONE PREMIX) 360 mg/200 mL dextrose     Followed by  . amiodarone (NEXTERONE PREMIX) 360 mg/200 mL  dextrose    . diltiazem (CARDIZEM) infusion 5 mg/hr (07/18/13 1708)    Assessment: 80 yo female with history of afib will be started on heparin therapy.  Patient was on Eliquis prior to admission.  Per patient, last dose of Eliquis (5 mg PO BID) was about 0930 on 07/18/13.  Baseline Hgb is 12.5, Plt 247 K, INR 1.59, aPTT 36, and heparin level >2.2.  Goal of Therapy:  Heparin level 0.3 - 0.7 units/ml ; aPTT 66-102  Monitor platelets by anticoagulation protocol: Yes   Plan:  1) Start heparin at 850 units/hr at 2200 tonight. No bolus 2) Check an 8hr heparin level and aPTT after drip is started 3) Daily heparin level, CBC and aPTT.   Tsz-Yin Cardale Dorer 07/18/2013,5:49 PM

## 2013-07-18 NOTE — Assessment & Plan Note (Signed)
Symptomatic but BP is stable at 122/78. Will plan for direct admit to [facility].

## 2013-07-18 NOTE — Patient Instructions (Signed)
Go to admitting at [facility]

## 2013-07-18 NOTE — Progress Notes (Signed)
Patient ID: Denise Pugh, female   DOB: [birth year], 80 y.o.   MRN: [id number]    07/18/2013 Denise Pugh   1928/08/31  [id number]  Primary Physicia Delphina Cahill, MD Primary Cardiologist: [name]  HPI:  The patient is a 80 y/o female, followed by [name], who presents to clinic with a complaint of extreme fatigue and slight dyspnea on exertion. She has been seen multiple times in clinic in the past month for Afib. She has a h/o supraventricular tachycardia / Afib/flutter, that has been captured by Cardionet monitoring. She has been on BID Toprol XL and Cardizem for rate control, and Eliquis for anticoagulation (CHADSVASC score of 4). She had a stress test in May 2013 which showed breast attenuation artifact with a post stress ejection fraction of 70% without evidence for scar or ischemia. A 2-D echo Doppler study on 09/15/2012 showed an ejection fraction of 60-65%. She did have grade 1 diastolic dysfunction and elevated LV filling pressures. The aortic valve was sclerotic without stenosis and there was mild/moderate central regurgitation, mild mitral regurgitation and mild tricuspid regurgitation with mild pulmonary hypertension with a PA estimate pressure at 47 mm. She also has hypothyroidism, treated with Synthroid.   An EKG in the office today demonstrates atrial fibrillation with RVR with rates in the 130s-140s. She is symptomatic, with significant fatigue and DOE. BP is stable a 122/78. It should also be noted that she has had some limitation with upward titration of her rate control agents due to bradycardia, and [name] has suggested the possibility of needing a PPM, if this continued to be a limiting factor. She was recently monitored by [place], to assess her AFib burden, and was found to have frequent bradycardia, with average HR of 51 bpm and lowest at 39 bpm.   Current Outpatient Prescriptions  Medication Sig Dispense Refill  . acetaminophen (TYLENOL) 500 MG tablet Take 500 mg by  mouth every 6 (six) hours as needed. For fever      . ALPRAZolam (XANAX) 0.5 MG tablet Take 1 tablet by mouth every 8 (eight) hours.      Marland Kitchen apixaban (ELIQUIS) 5 MG TABS tablet Take 1 tablet (5 mg total) by mouth 2 (two) times daily.  60 tablet  11  . Calcium Carb-Cholecalciferol (CALCIUM 1000 + D PO) Take 1 tablet by mouth daily.      . Cholecalciferol (VITAMIN D) 2000 UNITS tablet Take 2,000 Units by mouth daily.      Marland Kitchen dexlansoprazole (DEXILANT) 60 MG capsule Take 60 mg by mouth 2 (two) times daily.      . diclofenac (VOLTAREN) 75 MG EC tablet Take 75 mg by mouth 2 (two) times daily.      Marland Kitchen diltiazem (CARDIZEM CD) 120 MG 24 hr capsule Take 1 capsule (120 mg total) by mouth daily.  90 capsule  3  . docusate sodium (COLACE) 100 MG capsule Take 100 mg by mouth 2 (two) times daily.      Marland Kitchen levothyroxine (SYNTHROID, LEVOTHROID) 75 MCG tablet Take 75 mcg by mouth daily.      Marland Kitchen losartan-hydrochlorothiazide (HYZAAR) 100-12.5 MG per tablet takes 1/2 tablet      . metoprolol succinate (TOPROL-XL) 25 MG 24 hr tablet Take 1 tablet by mouth each morning and 1/2 tablet each evening.  135 tablet  2  . Multiple Vitamin (MULITIVITAMIN WITH MINERALS) TABS Take 1 tablet by mouth daily.      Marland Kitchen oxybutynin (DITROPAN-XL) 10 MG 24 hr tablet Take 10  mg by mouth daily.      . rosuvastatin (CRESTOR) 10 MG tablet Take 10 mg by mouth. Takes 1/2 tablet daily      . traMADol (ULTRAM) 50 MG tablet Take 50 mg by mouth 2 (two) times daily.        No current facility-administered medications for this visit.    Allergies  Allergen Reactions  . Penicillins Other (See Comments)    tingling  . Tetanus Toxoid Swelling    History   Social History  . Marital Status: Married    Spouse Name: N/A    Number of Children: N/A  . Years of Education: N/A   Occupational History  . Not on file.   Social History Main Topics  . Smoking status: Never Smoker   . Smokeless tobacco: Never Used  . Alcohol Use: No  . Drug Use: No    . Sexual Activity: Not on file   Other Topics Concern  . Not on file   Social History Narrative  . No narrative on file     Review of Systems: General: negative for chills, fever, night sweats or weight changes.  Cardiovascular: negative for chest pain, dyspnea on exertion, edema, orthopnea, palpitations, paroxysmal nocturnal dyspnea or shortness of breath Dermatological: negative for rash Respiratory: negative for cough or wheezing Urologic: negative for hematuria Abdominal: negative for nausea, vomiting, diarrhea, bright red blood per rectum, melena, or hematemesis Neurologic: negative for visual changes, syncope, or dizziness All other systems reviewed and are otherwise negative except as noted above.    Blood pressure 122/78, pulse 147, height 5\' 3"  (1.6 m), weight 171 lb 14.4 oz (77.973 kg).  General appearance: alert, cooperative and tearful Lungs: clear to auscultation bilaterally Heart: irregularly irregular rhythm and tachy Extremities: trace -1+ LEE, L>R Pulses: 2+ and symmetric Skin: warm and dry Neurologic: Grossly normal  EKG Afib w/ RVR, ventricular rate 147 bpm  ASSESSMENT AND PLAN:   Atrial fibrillation with RVR Symptomatic but BP is stable at 122/78. Will plan for direct admit to [facility].  -Admit to telemetry  -Start IV Cardizem for rate control  -Continue BB  -Continue on Eliquis  -Check TSH, BMP and CBC  - consider DCCV if she fails to spontaneously convert to NSR  - ? Need for PPM, if continued rate control strategy is elected    PLAN  [facility] has been notified of direct admission. Pt will report to admitting first, then will be placed on 3W for telemetry monitoring. Plan outlined above.   Brittainy SimmonsPA-C 07/18/2013 3:18 PM

## 2013-07-19 ENCOUNTER — Inpatient Hospital Stay (HOSPITAL_COMMUNITY): Payer: Medicare Other

## 2013-07-19 LAB — LIPID PANEL
Cholesterol: 120 mg/dL (ref 0–200)
HDL: 45 mg/dL (ref >39)
LDL Cholesterol: 65 mg/dL (ref 0–99)
TRIGLYCERIDES: 52 mg/dL (ref <150)
Total CHOL/HDL Ratio: 2.7 RATIO
VLDL: 10 mg/dL (ref 0–40)

## 2013-07-19 LAB — BASIC METABOLIC PANEL
BUN: 27 mg/dL — ABNORMAL HIGH (ref 6–23)
CALCIUM: 8.6 mg/dL (ref 8.4–10.5)
CO2: 26 meq/L (ref 19–32)
Chloride: 101 mEq/L (ref 96–112)
Creatinine, Ser: 1.3 mg/dL — ABNORMAL HIGH (ref 0.50–1.10)
GFR calc non Af Amer: 37 mL/min — ABNORMAL LOW (ref >90)
GFR, EST AFRICAN AMERICAN: 43 mL/min — AB (ref >90)
Glucose, Bld: 108 mg/dL — ABNORMAL HIGH (ref 70–99)
Potassium: 4.3 mEq/L (ref 3.7–5.3)
SODIUM: 140 meq/L (ref 137–147)

## 2013-07-19 LAB — HEPARIN LEVEL (UNFRACTIONATED)

## 2013-07-19 LAB — CBC
HCT: 34.6 % — ABNORMAL LOW (ref 36.0–46.0)
Hemoglobin: 11.2 g/dL — ABNORMAL LOW (ref 12.0–15.0)
MCH: 28.6 pg (ref 26.0–34.0)
MCHC: 32.4 g/dL (ref 30.0–36.0)
MCV: 88.5 fL (ref 78.0–100.0)
PLATELETS: 192 10*3/uL (ref 150–400)
RBC: 3.91 MIL/uL (ref 3.87–5.11)
RDW: 16.2 % — ABNORMAL HIGH (ref 11.5–15.5)
WBC: 6.4 10*3/uL (ref 4.0–10.5)

## 2013-07-19 LAB — APTT
aPTT: 103 seconds — ABNORMAL HIGH (ref 24–37)
aPTT: 78 seconds — ABNORMAL HIGH (ref 24–37)
aPTT: 75 seconds — ABNORMAL HIGH (ref 24–37)

## 2013-07-19 LAB — T4, FREE: Free T4: 1.6 ng/dL (ref 0.80–1.80)

## 2013-07-19 MED ORDER — ALPRAZOLAM 0.25 MG PO TABS
0.2500 mg | ORAL_TABLET | Freq: Once | ORAL | Status: AC
  Administered 2013-07-19: 0.25 mg via ORAL
  Filled 2013-07-19: qty 1

## 2013-07-19 MED ORDER — METOPROLOL TARTRATE 25 MG PO TABS
25.0000 mg | ORAL_TABLET | Freq: Three times a day (TID) | ORAL | Status: DC
  Administered 2013-07-19 (×3): 25 mg via ORAL
  Filled 2013-07-19 (×8): qty 1

## 2013-07-19 MED ORDER — SODIUM CHLORIDE 0.9 % IR SOLN
80.0000 mg | Status: DC
  Filled 2013-07-19: qty 2

## 2013-07-19 MED ORDER — CHLORHEXIDINE GLUCONATE 4 % EX LIQD
60.0000 mL | Freq: Once | CUTANEOUS | Status: AC
Start: 2013-07-20 — End: 2013-07-20
  Administered 2013-07-20: 4 via TOPICAL
  Filled 2013-07-19: qty 60

## 2013-07-19 MED ORDER — SODIUM CHLORIDE 0.9 % IV SOLN: INTRAVENOUS | Status: DC

## 2013-07-19 MED ORDER — VANCOMYCIN HCL IN DEXTROSE 1-5 GM/200ML-% IV SOLN
1000.0000 mg | INTRAVENOUS | Status: DC
  Filled 2013-07-19: qty 200

## 2013-07-19 MED ORDER — CHLORHEXIDINE GLUCONATE 4 % EX LIQD
60.0000 mL | Freq: Once | CUTANEOUS | Status: AC
  Administered 2013-07-19: 4 via TOPICAL

## 2013-07-19 NOTE — Progress Notes (Signed)
ANTICOAGULATION CONSULT NOTE - Follow Up Consult  Pharmacy Consult for heparin Indication: atrial fibrillation  Labs:  Recent Labs  07/18/13 1843 07/19/13 0539 07/19/13 1410  HGB 12.5 11.2*  --   HCT 38.3 34.6*  --   PLT 247 192  --   APTT 36 103* 78*  LABPROT 18.5*  --   --   INR 1.59*  --   --   HEPARINUNFRC >2.20* >2.20*  --   CREATININE 1.12* 1.30*  --     Assessment: 80yo female continues on heparin IV for afib. She was on apixaban PTA. APTT is now therapeutic at 78. No bleeding noted. Planning on pacemaker placement tomorrow.   Goal of Therapy:  Heparin level 0.3-0.7 units/ml aPTT 66-102 seconds   Plan:  1. Continue heparin gtt 750 units/hr 2. Check an 8 hour aPTT and heparin level to confirm 3. Daily aPTT and heparin level  Salome Arnt, PharmD, BCPS Pager # [phone] 07/19/2013 3:33 PM

## 2013-07-19 NOTE — Progress Notes (Signed)
Subjective:  No chest pain;  Less tachycardic   Objective:   Vital Signs in the last 24 hours: Temp:  [97.3 F (36.3 C)-97.7 F (36.5 C)] 97.7 F (36.5 C) (04/29 0551) Pulse Rate:  [68-147] 95 (04/29 0551) Resp:  [18] 18 (04/29 0551) BP: (106-145)/(65-91) 106/73 mmHg (04/29 0551) SpO2:  [96 %-97 %] 96 % (04/29 0551) Weight:  [169 lb 11.2 oz (76.975 kg)-171 lb 14.4 oz (77.973 kg)] 169 lb 11.2 oz (76.975 kg) (04/29 0551)  Intake/Output from previous day: 04/28 0701 - 04/29 0700 In: 360 [P.O.:360] Out: -   Medications: . ALPRAZolam  0.5 mg Oral QHS  . atorvastatin  20 mg Oral q1800  . cholecalciferol  2,000 Units Oral Daily  . diclofenac  75 mg Oral BID  . docusate sodium  100 mg Oral BID  . losartan  50 mg Oral Daily   And  . hydrochlorothiazide  6.25 mg Oral Daily  . levothyroxine  75 mcg Oral QAC breakfast  . metoprolol succinate  25 mg Oral Daily  . multivitamin with minerals  1 tablet Oral Daily  . oxybutynin  10 mg Oral QHS  . pantoprazole  40 mg Oral Daily    . sodium chloride    . amiodarone (NEXTERONE PREMIX) 360 mg/200 mL dextrose 30 mg/hr (07/19/13 0555)  . diltiazem (CARDIZEM) infusion Stopped (07/18/13 2359)  . heparin 750 Units/hr (07/19/13 0640)    Physical Exam:   General appearance: alert, cooperative and no distress Neck: no adenopathy, no JVD, supple, symmetrical, trachea midline and thyroid not enlarged, symmetric, no tenderness/mass/nodules Lungs: clear to auscultation bilaterally Heart: irregularly irregular rhythm and 1/6 sem Abdomen: soft, non-tender; bowel sounds normal; no masses,  no organomegaly Extremities: no edema, redness or tenderness in the calves or thighs Pulses: 2+ and symmetric Skin: Skin color, texture, turgor normal. No rashes or lesions Neurologic: Grossly normal   Rate: 110-120  Rhythm: atrial fibrillation  Lab Results:   Recent Labs  07/18/13 1843 07/19/13 0539  NA 139 140  K 4.3 4.3  CL 102 101  CO2 21 26   GLUCOSE 92 108*  BUN 23 27*  CREATININE 1.12* 1.30*   No results found for this basename: TROPONINI, CK, MB,  in the last 72 hours  Hepatic Function Panel  Recent Labs  07/18/13 1843  PROT 7.0  ALBUMIN 3.6  AST 26  ALT 31  ALKPHOS 119*  BILITOT 0.3    Recent Labs  07/18/13 1843  INR 1.59*   BNP (last 3 results)  Recent Labs  07/18/13 1843  PROBNP 5333.0*    Lipid Panel     Component Value Date/Time   CHOL 120 07/19/2013 0539   TRIG 52 07/19/2013 0539   HDL 45 07/19/2013 0539   CHOLHDL 2.7 07/19/2013 0539   VLDL 10 07/19/2013 0539   LDLCALC 65 07/19/2013 0539      Imaging:  No results found.    Assessment/Plan:   Principal Problem:   Atrial fibrillation with RVR Active Problems:   DEPRESSION, recent death of her husband   Hypothyroidism   PAF (paroxysmal atrial fibrillation)   Bradycardia, also idioventricular rhythm    Discussed permanent pacemaker with patient and SSS. Last dose of eliquis was yesterday am. Now on heparin. Remains in AF with rate 110 range on iv amiodarone. Will change metoprolol succinate to tartrate 25 mg every 8 hours if HR does not get to slow. No further WCT.  Will dc diclofenac. Keep NPO in am for  pacemaker tomorrow.   Troy Sine, MD, [facility] 07/19/2013, 8:52 AM

## 2013-07-19 NOTE — Progress Notes (Signed)
ANTICOAGULATION CONSULT NOTE - Follow Up Consult  Pharmacy Consult for heparin Indication: atrial fibrillation  Labs:  Recent Labs  07/18/13 1843 07/19/13 0539  HGB 12.5 11.2*  HCT 38.3 34.6*  PLT 247 192  APTT 36 103*  LABPROT 18.5*  --   INR 1.59*  --   HEPARINUNFRC >2.20*  --   CREATININE 1.12*  --     Assessment: 80yo female slightly supratherapeutic on heparin with initial dosing for Afib (using PTT to dose while clearing Eliquis).  Goal of Therapy:  Heparin level 0.3-0.7 units/ml aPTT 66-102 seconds   Plan:  Will decrease heparin gtt by 1 unit/kg/hr to 750 units/hr and check PTT in [place], PharmD, BCPS  07/19/2013,6:36 AM

## 2013-07-19 NOTE — Progress Notes (Signed)
Asked by [name] to evaluate for dual chamber permanent pacemaker. Denise Pugh has frequent symptomatic atrial fibrillation with rapid ventricular response, requiring relatively high doses of AV blocking agents for ventricular rate control. When in sinus rhythm she has documented and symptomatic bradycardia, with rates documented to be as low as 39 bpm. She would benefit from a pacemaker for symptoms of bradycardia due to necessary medications. This procedure has been fully reviewed with the patient and written informed consent has been obtained. Denise Klein, MD, [facility] [phone] office [phone] pager

## 2013-07-19 NOTE — Progress Notes (Signed)
Utilization review completed.  

## 2013-07-20 ENCOUNTER — Encounter (HOSPITAL_COMMUNITY)
Admission: AD | Disposition: A | Discharge status: Home / Self Care | Payer: Self-pay | Source: Ambulatory Visit | Attending: Cardiovascular Disease

## 2013-07-20 DIAGNOSIS — Z950 Presence of cardiac pacemaker: Secondary | ICD-10-CM | POA: Diagnosis not present

## 2013-07-20 DIAGNOSIS — T50905A Adverse effect of unspecified drugs, medicaments and biological substances, initial encounter: Secondary | ICD-10-CM

## 2013-07-20 DIAGNOSIS — R001 Bradycardia, unspecified: Secondary | ICD-10-CM | POA: Diagnosis present

## 2013-07-20 HISTORY — PX: PERMANENT PACEMAKER INSERTION: SHX5480

## 2013-07-20 LAB — APTT: aPTT: 86 seconds — ABNORMAL HIGH (ref 24–37)

## 2013-07-20 LAB — CBC
HEMATOCRIT: 35.2 % — AB (ref 36.0–46.0)
HEMOGLOBIN: 11.6 g/dL — AB (ref 12.0–15.0)
MCH: 28.6 pg (ref 26.0–34.0)
MCHC: 33 g/dL (ref 30.0–36.0)
MCV: 86.7 fL (ref 78.0–100.0)
Platelets: 210 10*3/uL (ref 150–400)
RBC: 4.06 MIL/uL (ref 3.87–5.11)
RDW: 16.1 % — ABNORMAL HIGH (ref 11.5–15.5)
WBC: 6.9 10*3/uL (ref 4.0–10.5)

## 2013-07-20 LAB — HEPARIN LEVEL (UNFRACTIONATED)
Heparin Unfractionated: 1.34 IU/mL — ABNORMAL HIGH (ref 0.30–0.70)
Heparin Unfractionated: 1.1 IU/mL — ABNORMAL HIGH (ref 0.30–0.70)

## 2013-07-20 SURGERY — PERMANENT PACEMAKER INSERTION
Anesthesia: LOCAL

## 2013-07-20 MED ORDER — FENTANYL CITRATE 0.05 MG/ML IJ SOLN
INTRAMUSCULAR | Status: AC
  Filled 2013-07-20: qty 2

## 2013-07-20 MED ORDER — MIDAZOLAM HCL 5 MG/5ML IJ SOLN
INTRAMUSCULAR | Status: AC
  Filled 2013-07-20: qty 5

## 2013-07-20 MED ORDER — LIDOCAINE HCL (PF) 1 % IJ SOLN
INTRAMUSCULAR | Status: AC
  Filled 2013-07-20: qty 60

## 2013-07-20 MED ORDER — VANCOMYCIN HCL IN DEXTROSE 1-5 GM/200ML-% IV SOLN
1000.0000 mg | Freq: Two times a day (BID) | INTRAVENOUS | Status: AC
  Administered 2013-07-20: 1000 mg via INTRAVENOUS
  Filled 2013-07-20: qty 200

## 2013-07-20 MED ORDER — SODIUM CHLORIDE 0.9 % IV SOLN
INTRAVENOUS | Status: DC
  Administered 2013-07-20: 23:00:00 via INTRAVENOUS

## 2013-07-20 MED ORDER — METOPROLOL TARTRATE 50 MG PO TABS
50.0000 mg | ORAL_TABLET | Freq: Three times a day (TID) | ORAL | Status: DC
  Administered 2013-07-20 – 2013-07-21 (×4): 50 mg via ORAL
  Filled 2013-07-20 (×7): qty 1

## 2013-07-20 MED ORDER — HEPARIN (PORCINE) IN NACL 2-0.9 UNIT/ML-% IJ SOLN
INTRAMUSCULAR | Status: AC
  Filled 2013-07-20: qty 1000

## 2013-07-20 NOTE — Progress Notes (Signed)
Upon assessment, pacemaker site looks more swollen than previous assessment.  Spoke with Denise Pugh in Cath Lab and he came up to see patient.  Held pressure approximately 5 minutes.  Went back to [employer] and spoke with Dr. Loletha Grayer.  And he is up now applying a pressure dressing to the site.  Patient without complaints.  Will continue to monitor closely.

## 2013-07-20 NOTE — Progress Notes (Signed)
Orthopedic Tech Progress Note Patient Details:  Denise Pugh [birth year] [id number] Spoke with patient's nurse; patient already has arm sling. No action needed from Ortho Tech at this time. Patient ID: Denise Pugh, female   DOB: [birth year], 80 y.o.   MRN: [id number]   Fenton Foy 07/20/2013, 10:25 AM

## 2013-07-20 NOTE — Op Note (Signed)
Procedure report  Procedure performed:  1. Implantation of new dual chamber permanent pacemaker 2. Fluoroscopy 3. Light sedation   Reason for procedure: Symptomatic sinus bradycardia due to necessary medications  Procedure performed by: [name], MD  Complications: None  Estimated blood loss: <10 mL  Medications administered during procedure: Vancomycin 1 g intravenously Lidocaine 1% 30 mL locally,  Fentanyl 25 mcg intravenously Versed 2 mg intravenously  Device details: Generator Medtronic Advisa MRI model A2DR01 serial number [id number] H Right atrial lead Medtronic [id number] serial number [id number] Right ventricular lead Medtronic [id number] serial number [id number]  Procedure details:  After the risks and benefits of the procedure were discussed the patient provided informed consent and was brought to the cardiac cath lab in the fasting state. The patient was prepped and draped in usual sterile fashion. Local anesthesia with 1% lidocaine was administered to to the left infraclavicular area. A 5-6 cm horizontal incision was made parallel with and 2-3 cm caudal to the left clavicle. Using electrocautery and blunt dissection a prepectoral pocket was created down to the level of the pectoralis major muscle fascia. The pocket was carefully inspected for hemostasis. An antibiotic-soaked sponge was placed in the pocket.  Under fluoroscopic guidance and using the modified Seldinger technique 2 separate venipunctures were performed to access the left subclavian vein. No difficulty was encountered accessing the vein.  Two J-tip guidewires were subsequently exchanged for two 7 French safe sheaths.  Under fluoroscopic guidance the ventricular lead was advanced to level of the mid to apical right ventricular septum and thet active-fixation helix was deployed. Prominent current of injury was seen. Satisfactory pacing and sensing parameters were recorded. There was no evidence of  diaphragmatic stimulation at maximum device output. The safe sheath was peeled away and the lead was secured in place with 2-0 silk.  In similar fashion the right atrial lead was advanced to the level of the atrial appendage. The active-fixation helix was deployed. There was prominent current of injury. Satisfactory  pacing and sensing parameters were recorded. There was no evidence of diaphragmatic stimulation with pacing at maximum device output. The safe sheath was peeled away and the lead was secured in place with 2-0 silk.  The antibiotic-soaked sponge was removed from the pocket. The pocket was flushed with copious amounts of antibiotic solution. Reinspection showed excellent hemostasis..  The ventricular lead was connected to the generator and appropriate ventricular pacing was seen. Subsequently the atrial lead was also connected. Repeat testing of the lead parameters later showed excellent values.  The entire system was then carefully inserted in the pocket with care been taking that the leads and device assumed a comfortable position without pressure on the incision. Great care was taken that the leads be located deep to the generator. The pocket was then closed in layers using 2 layers of 2-0 Vicryl and cutaneous staples, after which a sterile dressing was applied.  At the end of the procedure the following lead parameters were encountered:  Right atrial lead  sensed P waves 1.4 (atrial fibrillation), impedance 613 ohms, threshold not tested due to atrial fibrillation Right ventricular lead sensed R waves 14.9 mV, impedance 1177ohms, threshold 0.9 V at 0.5 ms pulse width.  [name], MD, [facility] [phone] office [phone] pager   Cc:

## 2013-07-20 NOTE — Progress Notes (Signed)
ANTICOAGULATION CONSULT NOTE - Follow Up Consult  Pharmacy Consult for heparin Indication: atrial fibrillation  Labs:  Recent Labs  07/18/13 1843 07/19/13 0539 07/19/13 1410 07/19/13 2245  HGB 12.5 11.2*  --   --   HCT 38.3 34.6*  --   --   PLT 247 192  --   --   APTT 36 103* 78* 75*  LABPROT 18.5*  --   --   --   INR 1.59*  --   --   --   HEPARINUNFRC >2.20* >2.20*  --  1.34*  CREATININE 1.12* 1.30*  --   --     Assessment/Plan:  80yo female remains therapeutic on heparin using PTT while Eliquis clearing (heparin level trending down). Will continue gtt at current rate and confirm stable with am labs.   Wynona Neat, PharmD, BCPS  07/20/2013,12:31 AM

## 2013-07-21 ENCOUNTER — Inpatient Hospital Stay (HOSPITAL_COMMUNITY): Payer: Medicare Other

## 2013-07-21 DIAGNOSIS — I10 Essential (primary) hypertension: Secondary | ICD-10-CM

## 2013-07-21 DIAGNOSIS — E785 Hyperlipidemia, unspecified: Secondary | ICD-10-CM

## 2013-07-21 DIAGNOSIS — E039 Hypothyroidism, unspecified: Secondary | ICD-10-CM

## 2013-07-21 DIAGNOSIS — I495 Sick sinus syndrome: Secondary | ICD-10-CM | POA: Diagnosis present

## 2013-07-21 LAB — CBC
HCT: 32.5 % — ABNORMAL LOW (ref 36.0–46.0)
Hemoglobin: 10.6 g/dL — ABNORMAL LOW (ref 12.0–15.0)
MCH: 28.4 pg (ref 26.0–34.0)
MCHC: 32.6 g/dL (ref 30.0–36.0)
MCV: 87.1 fL (ref 78.0–100.0)
Platelets: 174 10*3/uL (ref 150–400)
RBC: 3.73 MIL/uL — ABNORMAL LOW (ref 3.87–5.11)
RDW: 16.2 % — ABNORMAL HIGH (ref 11.5–15.5)
WBC: 6.8 10*3/uL (ref 4.0–10.5)

## 2013-07-21 MED ORDER — METOPROLOL TARTRATE 50 MG PO TABS
50.0000 mg | ORAL_TABLET | Freq: Two times a day (BID) | ORAL | Status: DC
  Filled 2013-07-21: qty 1

## 2013-07-21 MED ORDER — BISACODYL 10 MG RE SUPP
10.0000 mg | Freq: Once | RECTAL | Status: AC
  Administered 2013-07-21: 10 mg via RECTAL
  Filled 2013-07-21: qty 1

## 2013-07-21 MED ORDER — METOPROLOL TARTRATE 50 MG PO TABS: 50.0000 mg | ORAL_TABLET | Freq: Two times a day (BID) | ORAL | Status: DC

## 2013-07-21 MED ORDER — APIXABAN 5 MG PO TABS: 5.0000 mg | ORAL_TABLET | Freq: Two times a day (BID) | ORAL | Status: DC

## 2013-07-21 MED ORDER — AMIODARONE HCL 200 MG PO TABS
400.0000 mg | ORAL_TABLET | Freq: Two times a day (BID) | ORAL | Status: DC
  Administered 2013-07-21: 400 mg via ORAL
  Filled 2013-07-21 (×2): qty 2

## 2013-07-21 MED ORDER — AMIODARONE HCL 200 MG PO TABS: ORAL_TABLET | ORAL | Status: DC

## 2013-07-21 MED ORDER — YOU HAVE A PACEMAKER BOOK
Freq: Once | Status: AC
  Administered 2013-07-21: 06:00:00
  Filled 2013-07-21: qty 1

## 2013-07-21 NOTE — Discharge Summary (Signed)
Patient ID: Denise Pugh,  MRN: [id number], DOB/AGE: [birth year] 78 y.o.  Admit date: 07/18/2013 Discharge date: 07/21/2013  Primary Care Provider: Delphina Cahill, MD  Primary Cardiologist: Dr Claiborne Billings  Discharge Diagnoses Principal Problem:   Symptomatic sinus bradycardia Active Problems:   Tachy-brady syndrome   S/P cardiac pacemaker procedure: Dr. Sallyanne Kuster: Generator Medtronic Advisa MRI    PAF (paroxysmal atrial fibrillation)   Atrial fibrillation with RVR   HYPERLIPIDEMIA   DEPRESSION, recent death of her husband   HYPERTENSION   Hypothyroidism    Procedures: Pacemaker implant 07/20/13   Hospital Course:  The patient is an 80 y/o female, followed by [name], who presented to the clinic 07/18/13 with a complaint of extreme fatigue and dyspnea on exertion. She has been seen multiple times in clinic in the past month for Afib. She has a h/o supraventricular tachycardia / Afib/flutter, that has been captured by Cardionet monitoring. She has been on BID Toprol XL and Cardizem for rate control, and Eliquis for anticoagulation (CHADSVASC score of 4). She had a stress test in May 2013 which showed breast attenuation artifact with a post stress ejection fraction of 70% without evidence for scar or ischemia. A 2-D echo Doppler study on 09/15/2012 showed an ejection fraction of 60-65%. She did have grade 1 diastolic dysfunction and elevated LV filling pressures. The aortic valve was sclerotic without stenosis and there was mild/moderate central regurgitation, mild mitral regurgitation and mild tricuspid regurgitation with mild pulmonary hypertension with a PA estimate pressure at 47 mm. She also has hypothyroidism, treated with Synthroid.               EKG in the office demonstrated atrial fibrillation with RVR with rates in the 130s-140s. She was symptomatic, with significant fatigue and DOE. Her BP was stable a 122/78. It should also be noted that she has had some limitation with upward titration  of her rate control agents due to bradycardia, and [name] has suggested the possibility of needing a PPM, if this continued to be a limiting factor. She was recently monitored by Cardionet, to assess her AFib burden, and was found to have frequent bradycardia, with average HR of 51 bpm and lowest at 39 bpm. She was admitted from the office, her Eloquis was held, and on 07/20/13 she had a MDT pacemaker implanted. Amiodarone load was started and we feel she can be discharged 07/21/13. She has an appointment with Dr Claiborne Billings 07/24/13, and she will be seen in the pacer clinic in a week for a site check.    Discharge Vitals:  Blood pressure 124/73, pulse 60, temperature 97.7 F (36.5 C), temperature source Oral, resp. rate 18, height 5\' 3"  (1.6 m), weight 173 lb 9.6 oz (78.744 kg), SpO2 98.00%.    Labs: Results for orders placed during the hospital encounter of 07/18/13 (from the past 48 hour(s))  APTT     Status: Abnormal   Collection Time    07/19/13  2:10 PM      Result Value Ref Range   aPTT 78 (*) 24 - 37 seconds   Comment:            IF BASELINE aPTT IS ELEVATED,     SUGGEST PATIENT RISK ASSESSMENT     BE USED TO DETERMINE APPROPRIATE     ANTICOAGULANT THERAPY.  APTT     Status: Abnormal   Collection Time    07/19/13 10:45 PM      Result Value Ref Range  aPTT 75 (*) 24 - 37 seconds   Comment:            IF BASELINE aPTT IS ELEVATED,     SUGGEST PATIENT RISK ASSESSMENT     BE USED TO DETERMINE APPROPRIATE     ANTICOAGULANT THERAPY.  HEPARIN LEVEL (UNFRACTIONATED)     Status: Abnormal   Collection Time    07/19/13 10:45 PM      Result Value Ref Range   Heparin Unfractionated 1.34 (*) 0.30 - 0.70 IU/mL   Comment: RESULTS CONFIRMED BY MANUAL DILUTION                IF HEPARIN RESULTS ARE BELOW     EXPECTED VALUES, AND PATIENT     DOSAGE HAS BEEN CONFIRMED,     SUGGEST FOLLOW UP TESTING     OF ANTITHROMBIN III LEVELS.  APTT     Status: Abnormal   Collection Time    07/20/13  5:35  AM      Result Value Ref Range   aPTT 86 (*) 24 - 37 seconds   Comment:            IF BASELINE aPTT IS ELEVATED,     SUGGEST PATIENT RISK ASSESSMENT     BE USED TO DETERMINE APPROPRIATE     ANTICOAGULANT THERAPY.  HEPARIN LEVEL (UNFRACTIONATED)     Status: Abnormal   Collection Time    07/20/13  5:35 AM      Result Value Ref Range   Heparin Unfractionated 1.10 (*) 0.30 - 0.70 IU/mL   Comment: RESULTS CONFIRMED BY MANUAL DILUTION                IF HEPARIN RESULTS ARE BELOW     EXPECTED VALUES, AND PATIENT     DOSAGE HAS BEEN CONFIRMED,     SUGGEST FOLLOW UP TESTING     OF ANTITHROMBIN III LEVELS.  CBC     Status: Abnormal   Collection Time    07/20/13  5:35 AM      Result Value Ref Range   WBC 6.9  4.0 - 10.5 K/uL   RBC 4.06  3.87 - 5.11 MIL/uL   Hemoglobin 11.6 (*) 12.0 - 15.0 g/dL   HCT 35.2 (*) 36.0 - 46.0 %   MCV 86.7  78.0 - 100.0 fL   MCH 28.6  26.0 - 34.0 pg   MCHC 33.0  30.0 - 36.0 g/dL   RDW 16.1 (*) 11.5 - 15.5 %   Platelets 210  150 - 400 K/uL  CBC     Status: Abnormal   Collection Time    07/21/13  4:32 AM      Result Value Ref Range   WBC 6.8  4.0 - 10.5 K/uL   RBC 3.73 (*) 3.87 - 5.11 MIL/uL   Hemoglobin 10.6 (*) 12.0 - 15.0 g/dL   HCT 32.5 (*) 36.0 - 46.0 %   MCV 87.1  78.0 - 100.0 fL   MCH 28.4  26.0 - 34.0 pg   MCHC 32.6  30.0 - 36.0 g/dL   RDW 16.2 (*) 11.5 - 15.5 %   Platelets 174  150 - 400 K/uL    Disposition:      Follow-up Information   Follow up with Troy Sine, MD On 07/24/2013. (11:15)    Specialty:  Cardiology   Contact information:   [address] [place] [address] [phone]       Follow up with Cristopher Peru,  MD On 08/02/2013. (4:30 pm at [facility] to check your pacer site)    Specialty:  Cardiology   Contact information:   [address] [place] [address] [phone]       Discharge Medications:    Medication List    STOP taking these medications       diltiazem 120  MG 24 hr capsule  Commonly known as:  CARDIZEM CD     metoprolol succinate 25 MG 24 hr tablet  Commonly known as:  TOPROL-XL      TAKE these medications       acetaminophen 500 MG tablet  Commonly known as:  TYLENOL  Take 500 mg by mouth every 6 (six) hours as needed (pain). For fever     ALPRAZolam 0.5 MG tablet  Commonly known as:  XANAX  Take 0.5 mg by mouth at bedtime.     amiodarone 200 MG tablet  Commonly known as:  PACERONE  Take two tablets twice a day for 7 days, then one tablet twice a day for 14 days, then one tablet daily.     apixaban 5 MG Tabs tablet  Commonly known as:  ELIQUIS  Take 1 tablet (5 mg total) by mouth 2 (two) times daily.     dexlansoprazole 60 MG capsule  Commonly known as:  DEXILANT  Take 60 mg by mouth 2 (two) times daily.     diclofenac 75 MG EC tablet  Commonly known as:  VOLTAREN  Take 75 mg by mouth 2 (two) times daily.     docusate sodium 100 MG capsule  Commonly known as:  COLACE  Take 100 mg by mouth 2 (two) times daily.     levothyroxine 75 MCG tablet  Commonly known as:  SYNTHROID, LEVOTHROID  Take 75 mcg by mouth daily.     losartan-hydrochlorothiazide 100-12.5 MG per tablet  Commonly known as:  HYZAAR  Take 0.5 tablets by mouth daily.     metoprolol 50 MG tablet  Commonly known as:  LOPRESSOR  Take 1 tablet (50 mg total) by mouth 2 (two) times daily.     multivitamin with minerals Tabs tablet  Take 1 tablet by mouth daily.     oxybutynin 10 MG 24 hr tablet  Commonly known as:  DITROPAN-XL  Take 10 mg by mouth at bedtime.     rosuvastatin 10 MG tablet  Commonly known as:  CRESTOR  Take 5 mg by mouth at bedtime. Takes 1/2 tablet daily     traMADol 50 MG tablet  Commonly known as:  ULTRAM  Take 50 mg by mouth 2 (two) times daily.     Vitamin D 2000 UNITS tablet  Take 2,000 Units by mouth daily.         Duration of Discharge Encounter: Greater than 30 minutes including physician time.  Angelena Form  PA-C 07/21/2013 12:05 PM  Attending Attestation:  She was admitted for recurrent the eighth episodes with both tachycardia and bradycardia consistent with tachybradycardia syndrome. He was admitted by [name] from the office, and started on amiodarone for rate and rhythm control. She was taken for pacemaker placement yesterday by Dr. [name] with a Medtronic pacemaker placed.  This was interrogated this morning and was working well. She was also noted to have converted into sinus bradycardia and was currently AV paced.   Although there was some mild oozing yesterday from the pocket site, today there was no significant oozing just mild ecchymosis. Chest  x-ray did not reveal any pneumothorax.  The plan is to discharge today on twice a day metoprolol. We'll restart Eliquis Tomorrow for her baseline underlying A. fib. We will continue oral loading of amiodarone as directed in the discharge medication list. She has followup arranged for both pacemaker device check as well as with [name].  She ablated today without any difficulty and is ready for discharge. I agree with the summary above.   Leonie Man, M.D., M.S. Interventional Cardiologist  [place] Pager # [phone] 07/21/2013

## 2013-07-21 NOTE — Progress Notes (Signed)
Discharge instructions reviewed with patient and light dressing applied per pt request. Discharged in wheelchair in stable condition

## 2013-07-21 NOTE — Progress Notes (Signed)
   80 y/o woman with PAF - Tachy-Brady Syndrome with frequent bouts of Tachycardia as well as Bradycardia due to High dose AV Nodal agents.  Admitted By [name] - Amiodarone loaded.  PPM Placed by Dr. Sallyanne Kuster yesterday. Mild ooze from pocked yesterday -- improved post pressure bandage.  Subjective:  Feels Fine. Constipated.  Objective:  Vital Signs in the last 24 hours: Temp:  [97.7 F (36.5 C)-98.6 F (37 C)] 97.7 F (36.5 C) (05/01 1026) Pulse Rate:  [58-60] 60 (05/01 1026) Resp:  [18-20] 18 (05/01 1026) BP: (113-131)/(53-91) 124/73 mmHg (05/01 1026) SpO2:  [94 %-98 %] 98 % (05/01 1026) Weight:  [173 lb 9.6 oz (78.744 kg)] 173 lb 9.6 oz (78.744 kg) (05/01 0507)  Intake/Output from previous day: 04/30 0701 - 05/01 0700 In: 712.5 [I.V.:712.5] Out: 350 [Urine:350] Intake/Output from this shift: Total I/O In: 240 [P.O.:240] Out: -   Physical Exam: General appearance: alert, cooperative, appears stated age and no distress Neck: no adenopathy, no carotid bruit and no JVD Lungs: clear to auscultation bilaterally, normal percussion bilaterally and non-labored, good air movement  Heart: RRR with normal S1 & S2; soft 1/6 SEM; no R/G Abdomen: soft, non-tender; bowel sounds normal; no masses,  no organomegaly Extremities: extremities normal, atraumatic, no cyanosis or edema Pulses: 2+ and symmetric Neurologic: Grossly normal PPM site - mild bruising, but no hematoma or erythema.  Lab Results:  Recent Labs  07/20/13 0535 07/21/13 0432  WBC 6.9 6.8  HGB 11.6* 10.6*  PLT 210 174    Recent Labs  07/18/13 1843 07/19/13 0539  NA 139 140  K 4.3 4.3  CL 102 101  CO2 21 26  GLUCOSE 92 108*  BUN 23 27*  CREATININE 1.12* 1.30*   No results found for this basename: TROPONINI, CK, MB,  in the last 72 hours Hepatic Function Panel  Recent Labs  07/18/13 1843  PROT 7.0  ALBUMIN 3.6  AST 26  ALT 31  ALKPHOS 119*  BILITOT 0.3    Recent Labs  07/19/13 0539  CHOL  120   No results found for this basename: PROTIME,  in the last 72 hours  Imaging: CXR: no PTX, leads in place.  Cardiac Studies: PPM placed; interrogated today, function OK. ECG: A-V Paced  Assessment/Plan:  Principal Problem:   Tachy-brady syndrome Active Problems:   PAF (paroxysmal atrial fibrillation)   Symptomatic sinus bradycardia   S/P cardiac pacemaker procedure: Dr. Sallyanne Kuster: Generator Medtronic Advisa MRI    HYPERLIPIDEMIA   Hypothyroidism   DEPRESSION, recent death of her husband   HYPERTENSION   Atrial fibrillation with RVR   Bradycardia, also idioventricular rhythm    Bradycardia, drug induced  Doing well post PPM.  Native HR below Pacing level; converted to SBrady overnight Pacer site is stable.   BP looks great.  PLAN:  Convert to PO Amio - 400 mg BID x 1 week, 200 mg BID x 2 weeks, then 200 mg daily  Will reduce BB to BID3  Statin  BB stable on BB, ARB, low dose HCTZ.; stable; no change  Suppository for constipation.  D/C home today -- will need PPM site check & f/u with Dr. Loletha Grayer per protocol.  Also to see Dr. Leda Gauze.   LOS: 3 days    Leonie Man 07/21/2013, 10:41 AM

## 2013-07-21 NOTE — Discharge Instructions (Signed)

## 2013-07-24 ENCOUNTER — Encounter: Payer: Self-pay | Admitting: Cardiovascular Disease

## 2013-07-24 ENCOUNTER — Ambulatory Visit (INDEPENDENT_AMBULATORY_CARE_PROVIDER_SITE_OTHER): Payer: Medicare Other | Admitting: Cardiovascular Disease

## 2013-07-24 VITALS — BP 136/64 | Ht 63.0 in | Wt 173.0 lb

## 2013-07-24 DIAGNOSIS — I495 Sick sinus syndrome: Secondary | ICD-10-CM

## 2013-07-24 DIAGNOSIS — I498 Other specified cardiac arrhythmias: Secondary | ICD-10-CM

## 2013-07-24 DIAGNOSIS — E785 Hyperlipidemia, unspecified: Secondary | ICD-10-CM

## 2013-07-24 DIAGNOSIS — I4891 Unspecified atrial fibrillation: Secondary | ICD-10-CM

## 2013-07-24 DIAGNOSIS — I480 Paroxysmal atrial fibrillation: Secondary | ICD-10-CM

## 2013-07-24 DIAGNOSIS — E039 Hypothyroidism, unspecified: Secondary | ICD-10-CM

## 2013-07-24 DIAGNOSIS — I10 Essential (primary) hypertension: Secondary | ICD-10-CM

## 2013-07-24 NOTE — Patient Instructions (Addendum)
Your physician recommends that you schedule a follow-up appointment in: 4 Months with Dr Claiborne Billings  Your physician recommends that you schedule a follow-up appointment in: 3 Months with Dr Croitoru/Pacer Check  Your physician has recommended you make the following change in your medication: Increase Losartan/HCTZ to 1 tablets daily, Stop Diclofenac

## 2013-07-26 ENCOUNTER — Ambulatory Visit (HOSPITAL_COMMUNITY): Payer: Self-pay | Admitting: Psychology

## 2013-08-01 ENCOUNTER — Encounter: Payer: Self-pay | Admitting: Cardiology

## 2013-08-01 ENCOUNTER — Ambulatory Visit (INDEPENDENT_AMBULATORY_CARE_PROVIDER_SITE_OTHER): Payer: Medicare Other | Admitting: Cardiology

## 2013-08-01 VITALS — BP 132/70 | HR 72 | Ht 63.0 in | Wt 168.0 lb

## 2013-08-01 DIAGNOSIS — Z950 Presence of cardiac pacemaker: Secondary | ICD-10-CM

## 2013-08-01 NOTE — Patient Instructions (Signed)
1. Continue with your same meds   2. Let us know  If you see any drainage or signs of infection at the pacemaker site  3.Make an appt for 30 days to have your pacemaker checked here in the office

## 2013-08-01 NOTE — Assessment & Plan Note (Signed)
The skin surrounding the wound is well healed. There is mild erythema. No swelling, no drainage or tenderness. No signs of systemic infection, denying fever, chills, night sweats, n/v. The staples were removed without difficulty. Betadine was applied to the area.

## 2013-08-01 NOTE — Progress Notes (Signed)
Patient ID: Denise Pugh, female   DOB: [birth year], 80 y.o.   MRN: [id number]    08/01/2013 Denise Pugh   Jul 12, 1928  [id number]  Primary Physicia Delphina Cahill, MD Primary Cardiologist: [name]  HPI:  The patient is a 80 y/o female, with h/o PAF, on Eliquis, who presents to clinic today for wound check/ staple removal, after undergoing a MDT PPM, by Dr. Sallyanne Kuster on 07/20/13. This was inserted for symptomatic bradycardia that would occur with rate control medications needed to manage her atrial fibrillation.  She states that she has done well post discharge. She continues to wear her arm sling. She has avoided overhead activities. She denies any increased erythema. No pain, swelling or drainage. No signs of systemic infection, denying fever, chills, night sweats, n/v.   Current Outpatient Prescriptions  Medication Sig Dispense Refill  . acetaminophen (TYLENOL) 500 MG tablet Take 500 mg by mouth every 6 (six) hours as needed (pain). For fever      . ALPRAZolam (XANAX) 0.5 MG tablet Take 0.5 mg by mouth at bedtime.       Marland Kitchen amiodarone (PACERONE) 200 MG tablet Take two tablets twice a day for 7 days, then one tablet twice a day for 14 days, then one tablet daily.  50 tablet  5  . apixaban (ELIQUIS) 5 MG TABS tablet Take 1 tablet (5 mg total) by mouth 2 (two) times daily.  60 tablet  11  . Cholecalciferol (VITAMIN D) 2000 UNITS tablet Take 2,000 Units by mouth daily.      Marland Kitchen dexlansoprazole (DEXILANT) 60 MG capsule Take 60 mg by mouth 2 (two) times daily.      Marland Kitchen docusate sodium (COLACE) 100 MG capsule Take 100 mg by mouth 2 (two) times daily.      Marland Kitchen levothyroxine (SYNTHROID, LEVOTHROID) 75 MCG tablet Take 75 mcg by mouth daily.      Marland Kitchen losartan-hydrochlorothiazide (HYZAAR) 100-12.5 MG per tablet Take 1 tablet by mouth daily.       . metoprolol (LOPRESSOR) 50 MG tablet Take 1 tablet (50 mg total) by mouth 2 (two) times daily.  60 tablet  11  . Multiple Vitamin (MULITIVITAMIN WITH MINERALS) TABS Take 1  tablet by mouth daily.      Marland Kitchen oxybutynin (DITROPAN-XL) 10 MG 24 hr tablet Take 10 mg by mouth at bedtime.       . rosuvastatin (CRESTOR) 10 MG tablet Take 5 mg by mouth at bedtime. Takes 1/2 tablet daily      . traMADol (ULTRAM) 50 MG tablet Take 50 mg by mouth 2 (two) times daily.        No current facility-administered medications for this visit.    Allergies  Allergen Reactions  . Penicillins Other (See Comments)    tingling  . Tetanus Toxoid Swelling    History   Social History  . Marital Status: Married    Spouse Name: N/A    Number of Children: N/A  . Years of Education: N/A   Occupational History  . Not on file.   Social History Main Topics  . Smoking status: Never Smoker   . Smokeless tobacco: Never Used  . Alcohol Use: No  . Drug Use: No  . Sexual Activity: No   Other Topics Concern  . Not on file   Social History Narrative  . No narrative on file     Review of Systems: General: negative for chills, fever, night sweats or weight changes.  Cardiovascular: negative for chest  pain, dyspnea on exertion, edema, orthopnea, palpitations, paroxysmal nocturnal dyspnea or shortness of breath Dermatological: negative for rash Respiratory: negative for cough or wheezing Urologic: negative for hematuria Abdominal: negative for nausea, vomiting, diarrhea, bright red blood per rectum, melena, or hematemesis Neurologic: negative for visual changes, syncope, or dizziness All other systems reviewed and are otherwise negative except as noted above.    Blood pressure 132/70, pulse 72, height 5\' 3"  (1.6 m), weight 168 lb (76.204 kg).  General appearance: alert, cooperative and no distress Lungs: clear to auscultation bilaterally Heart: regular rate and rhythm, S1, S2 normal, no murmur, click, rub or gallop Extremities: no LEE Pulses: 2+ and symmetric Skin: warm and dry; the skin surrounding the wound is well healed. There is mild erythema. No swelling, no drainage or  tenderness.  Neurologic: Grossly normal  EKG Not performed  ASSESSMENT AND PLAN:   S/P cardiac pacemaker procedure: Dr. Sallyanne Kuster: Generator Medtronic Advisa MRI  The skin surrounding the wound is well healed. There is mild erythema. No swelling, no drainage or tenderness. No signs of systemic infection, denying fever, chills, night sweats, n/v. The staples were removed without difficulty. Betadine was applied to the area.     PLAN  She was instructed to continue in arm sling for next 2 days to help restrict overhead activities. (will complete 2 week course). She was instructed to refrain from standing water (bath tubs, hot tubs, swimming pools, lakes) for another 4 weeks. F/u in 30 days for device check.    Brittainy SimmonsPA-C 08/01/2013 6:06 PM

## 2013-08-04 ENCOUNTER — Encounter: Payer: Self-pay | Admitting: Cardiovascular Disease

## 2013-08-04 NOTE — Progress Notes (Signed)
Patient ID: ALAJA GOLDINGER, female   DOB: Nov 11, 1928, 80 y.o.   MRN: [id number]     HPI: SHAUNITA SENEY is a 80 y.o. female who presents to the office today in followup cardiology evaluation patient with atrial fibrillation with rapid ventricular response.    Ms. Saki Legore is an 80 year old female who has a history of supraventricular tachycardia and moderate left ventricular hypertrophy with documented "spade-like ventricle."  She has previously documented diffuse T-wave abnormalities. A stress test in May 2013 showed breast attenuation artifact with a post stress ejection fraction of 70% without evidence for scar or ischemia. In March 2014  she did had episodes of leg swelling and I recommended the addition of torsemide 20 mg daily to her medical regimen recommended and discontinued her Voltaren. Subsequent laboratory on 07/15/2012 showed a creatinine of 1.72. She was advised to decrease her torsemide to every other day. She had experienced recurrent episodes of palpitations. A cardiac monitor which did reveal episodes of supraventricular tachycardia up to 185 beats per minute her metoprolol dose had been increased from 12.5-25 mg daily. At that time she was having significant anxiety at home with her husband's illness. Her husband has had some difficulty recently and in addition her water pump malfunction creating significant difficulties.  CardioNet monitor on her increased dose of Toprol-XL 25 mg still reveals bursts of tachycardia but palpitations and on 08/05/2012 at 12:30 she again had an episode of atrial tachycardia/A. flutter rate at 189 beats per minute.  On  May 17 she had an episode of tachycardia palpitations and did also have episodes of bradycardia; some of her spells suggested SVT at 150 sig in addition to episode of accelerated idioventricular rhythm when she was sinus bradycardic.  On  08/10/2012, I recommended slow additional titration of her metoprolol succinate to 25 mg in the morning  and 12.5 mg at night.   A 2-D echo Doppler study on 09/15/2012 showed an ejection fraction of 60-65%. She did have grade 1 diastolic dysfunction and elevated LV filling pressures.  The aortic valve was as sclerotic without stenosis and there was mild/moderate central regurgitation. 10 mild mitral regurgitation and mild tricuspid regurgitation with mild pulmonary hypertension with a PA estimate pressure at 47 mm.  He was recently admitted to [place] with atrial fibrillation with a rapid ventricular response.  She also had a CardioNet monitor which had shown episodes of sinus bradycardia, as well as P AF, with rates up to 170 beats per minute with also questionable nonsustained VT versus actual fibrillation with aberrancy.  When she was admitted on 07/18/2013 to [place], I initiated amiodarone therapy.  Her eloquence was held and she subsequent underwent permanent pacemaker insertion for sick sinus syndrome by Dr. Sallyanne Kuster.  She has not yet been back to the pacer clinic.  She had her appointment scheduled today from a previous office visit and she presents for further evaluation.  She denies chest pressure.  She has been taking amiodarone for milligrams twice a day for one week and is planned to take 200 mg twice a day for 2 weeks and ultimately 2 mg daily.  Past Medical History  Diagnosis Date  . Thyroid disease   . Arthritis   . Irregular heart beat   . Arrhythmia     HX of SVT. CARDIONET MONITOR 07/15/12 TO 08/13/12  . Sleep apnea     SLEEP STUDY-[facility]  . Hypertension 08/25/10    ECHO-EF 60-65%  . Hyperlipidemia  08/06/11    lexiscan myoview-normal. Due to severe attenuation the study specificity and sensitivty are deminished.  . Shortness of breath   . Hypothyroidism   . GERD (gastroesophageal reflux disease)     Past Surgical History  Procedure Laterality Date  . Knee arthroscopy    . Cardiac catheterization  07/06/01    spade-like configuration  .  Eye surgery    . Dilation and curettage of uterus      Allergies  Allergen Reactions  . Penicillins Other (See Comments)    tingling  . Tetanus Toxoid Swelling    Current Outpatient Prescriptions  Medication Sig Dispense Refill  . acetaminophen (TYLENOL) 500 MG tablet Take 500 mg by mouth every 6 (six) hours as needed (pain). For fever      . ALPRAZolam (XANAX) 0.5 MG tablet Take 0.5 mg by mouth at bedtime.       Marland Kitchen amiodarone (PACERONE) 200 MG tablet Take two tablets twice a day for 7 days, then one tablet twice a day for 14 days, then one tablet daily.  50 tablet  5  . apixaban (ELIQUIS) 5 MG TABS tablet Take 1 tablet (5 mg total) by mouth 2 (two) times daily.  60 tablet  11  . Cholecalciferol (VITAMIN D) 2000 UNITS tablet Take 2,000 Units by mouth daily.      Marland Kitchen dexlansoprazole (DEXILANT) 60 MG capsule Take 60 mg by mouth 2 (two) times daily.      Marland Kitchen docusate sodium (COLACE) 100 MG capsule Take 100 mg by mouth 2 (two) times daily.      Marland Kitchen levothyroxine (SYNTHROID, LEVOTHROID) 75 MCG tablet Take 75 mcg by mouth daily.      Marland Kitchen losartan-hydrochlorothiazide (HYZAAR) 100-12.5 MG per tablet Take 1 tablet by mouth daily.       . metoprolol (LOPRESSOR) 50 MG tablet Take 1 tablet (50 mg total) by mouth 2 (two) times daily.  60 tablet  11  . Multiple Vitamin (MULITIVITAMIN WITH MINERALS) TABS Take 1 tablet by mouth daily.      Marland Kitchen oxybutynin (DITROPAN-XL) 10 MG 24 hr tablet Take 10 mg by mouth at bedtime.       . rosuvastatin (CRESTOR) 10 MG tablet Take 5 mg by mouth at bedtime. Takes 1/2 tablet daily      . traMADol (ULTRAM) 50 MG tablet Take 50 mg by mouth 2 (two) times daily.        No current facility-administered medications for this visit.   Social history is notable in that she is now. Her husband did suffer a CVA and has been having more difficulty at home. She has one child, 2 step sons, one grandchild and 2 great-grandchildren. There is no tobacco or alcohol use. She does admit to frequent  anxiety.  ROV is negative for fever chills night sweats. She denies visual symptoms. She denies presyncope or syncope. She denies cough. She denies change in sputum production. There is no wheezing. She denies PND orthopnea.  He denies any further episodes of palpitations.  She denies presyncope or syncopemen. She did note some mild shortness of breath. There is no chest pain. She denies rash. She denies indigestion nausea vomiting or diarrhea.  She denies GU symptoms. She does have hypothyroidism on Synthroid replacement. She denies any tremor;she denies rash. She is unaware of diabetes. She does have history of hypothyroidism.  Other comprehensive 14 point system review is negative.  PE BP 136/64  Ht 5\' 3"  (1.6 m)  Wt 173 lb (78.472  kg)  BMI 30.65 kg/m2  Repeat blood pressure by me was 140/70. General: Alert, oriented, no distress.  HEENT: Normocephalic, atraumatic. Pupils round and reactive; sclera anicteric; Mouth/Parynx benign; Mallinpatti scale 2 Neck: No JVD, no carotid bruits Lungs: clear to ausculatation and percussion; no wheezing or rales Chest wall: Pacemaker implanted still with staples in place with plans for removal next week at her scheduled device clinic followup evaluation of her pacemaker  Heart: RRR, s1 s2 normal 2/6 SEM in the aortic region. No diastolic murmur the; no s3; no rubs thrills or heaves Abdomen: soft, nontender; no hepatosplenomehaly, BS+; abdominal aorta nontender and not dilated by palpation. Mildly obese Back: No CVA tenderness Pulses 2+ Extremities: no clubbinbg cyanosis or edema, Homan's sign negative  Neurologic: grossly nonfocal Psychologic: She continues to grieve the loss of her husband.  ECG: Atrial lead paced rhythm.  Mild RV conduction delay.  Diffuse T-wave inversion has been present previously  ECG today protheses independently read by me): Bradycardia at 48 beats per minute. RV conduction delay. Previously noted diffuse inferior and  anterolateral T wave inversion  Prior ECG of 01/20/2013: sinus bradycardia at 55 beats per minute. Previously noted diffuse T-wave inversion in leads 69F, V2 through V6. Mild RV conduction delay.  LABS: Basic Metabolic Panel: No results found for this basename: NA, K, CL, CO2, GLUCOSE, BUN, CREATININE, CALCIUM, MG, PHOS,  in the last 72 hours Liver Function Tests: No results found for this basename: AST, ALT, ALKPHOS, BILITOT, PROT, ALBUMIN,  in the last 72 hours No results found for this basename: LIPASE, AMYLASE,  in the last 72 hours CBC: No results found for this basename: WBC, NEUTROABS, HGB, HCT, MCV, PLT,  in the last 72 hours Cardiac Enzymes: No results found for this basename: CKTOTAL, CKMB, CKMBINDEX, TROPONINI,  in the last 72 hours BNP: No components found with this basename: POCBNP,  D-Dimer: No results found for this basename: DDIMER,  in the last 72 hours Hemoglobin A1C: No results found for this basename: HGBA1C,  in the last 72 hours Fasting Lipid Panel: No results found for this basename: CHOL, HDL, LDLCALC, TRIG, CHOLHDL, LDLDIRECT,  in the last 72 hours Thyroid Function Tests: No results found for this basename: TSH, T4TOTAL, FREET3, T3FREE, THYROIDAB,  in the last 72 hours Anemia Panel: No results found for this basename: VITAMINB12, FOLATE, FERRITIN, TIBC, IRON, RETICCTPCT,  in the last 72 hours  RADIOLOGY: No results found.    ASSESSMENT AND PLAN:   Ms. Cobbins is an 80 year old Caucasian female with a history of SVT and documented moderate left ventricle hypertrophy the with a "spade-like ventricle. " She has diffuse T-wave abnormalities which have been present for years. In 2003 she did undergo a cardiac catheterization and was not felt to have coronary obstructive disease but she had very minimal midsystolic bridging not felt to be significant in her LAD territory.  She does have a history of recent recurrent episodes of rapid atrial fibrillation, as well as  episodes of sinus bradycardia, suggestive of sick sinus syndrome.  She was recently admitted with rapid atrial fibrillation and at that time was started on amiodarone therapy.  She also underwent a pacemaker implantation.  She will be seen in device clinic in one week for follow up of her pacemaker and also with Dr. Scharlene Corn in several months.  Has only, she's not having any anginal symptoms.  She is maintaining sinus rhythm, without recurrent atrial fibrillation.  I am recommending further titration of her losartan HCT  to 100/12.5 mg daily, apparently, she had only been taking this pill in half.  I also recommended she discontinue her diclofenac.  She is now back on liquids for anticoagulation.  I will see her in 4 months for cardiology followup evaluation.   Troy Sine M.D., [facility] 08/04/2013 1:31 PM

## 2013-09-05 ENCOUNTER — Ambulatory Visit (INDEPENDENT_AMBULATORY_CARE_PROVIDER_SITE_OTHER): Payer: Medicare Other

## 2013-09-05 ENCOUNTER — Telehealth: Payer: Self-pay | Admitting: Cardiovascular Disease

## 2013-09-05 DIAGNOSIS — I10 Essential (primary) hypertension: Secondary | ICD-10-CM

## 2013-09-05 NOTE — Telephone Encounter (Signed)
Pt. States there is something sticking out of her pacer wound site, pt. To be call by [place] office and they will direct her to where to go to be evaluated

## 2013-09-05 NOTE — Telephone Encounter (Signed)
Mrs. Perriello states that the incision where Dr. Sallyanne Kuster put her pacemaker looks funny.  Please call.

## 2013-09-05 NOTE — Progress Notes (Signed)
Patient noted to have one loose suture at left upper chest wall site from pacemake insertion 7 weeks ago.Ms.Lawrence NP was able to simply remove dangling suture by hand and patient was sent on her way.Skin intact without redness at site

## 2013-09-29 ENCOUNTER — Telehealth: Payer: Self-pay | Admitting: Cardiovascular Disease

## 2013-09-29 MED ORDER — LOSARTAN POTASSIUM-HCTZ 100-12.5 MG PO TABS: 1.0000 | ORAL_TABLET | Freq: Every day | ORAL | Status: DC

## 2013-09-29 NOTE — Telephone Encounter (Signed)
Rx was sent to pharmacy electronically. 

## 2013-09-29 NOTE — Telephone Encounter (Signed)
Need a new prescription for her Losartan 100/125 mg #90.Please call to [facility].

## 2013-10-11 ENCOUNTER — Encounter: Payer: Self-pay | Admitting: Cardiovascular Disease

## 2013-10-12 ENCOUNTER — Telehealth: Payer: Self-pay | Admitting: Cardiovascular Disease

## 2013-10-12 NOTE — Telephone Encounter (Signed)
Pt wants to know if there is something else she can take in the place of Eliquis?Pt says the Eliquis is too expensive.

## 2013-10-12 NOTE — Telephone Encounter (Signed)
Returned call to patient she stated eliquis too expensive.Dr.Kelly out of office.Samples of eliquis 5 mg left at front desk of our [place] office.Message sent to [facility] for a alternative medication.

## 2013-10-17 ENCOUNTER — Encounter: Payer: Self-pay | Admitting: Cardiovascular Disease

## 2013-10-17 NOTE — Telephone Encounter (Signed)
Consider xarelto but may also be expensive. Otherwise, warfarin.

## 2013-10-18 NOTE — Telephone Encounter (Signed)
Returned call to patient Dr.Kelly advised would

## 2013-10-18 NOTE — Telephone Encounter (Signed)
Spoke to patient Dr.Kelly advised may try xarelto, but may be expensive also.Other option warfarin.Pateint stated she did not want to take warfarin.Message sent to Barry Brunner Dr.Kelly's nurse to see if she can help with patient assistance program for eliquis.

## 2013-10-19 NOTE — Telephone Encounter (Signed)
Will defer patient to [facility] for patient assistance program

## 2013-10-20 NOTE — Telephone Encounter (Signed)
Spoke with patient, she has tried calling for Eliquis assistance, has not heard back.  Gave her number for Xarelto as well, advised she continue to try and reach them, as warfarin is the only low cost alternative and she would rather avoid that.

## 2013-11-06 ENCOUNTER — Encounter: Payer: Self-pay | Admitting: Cardiovascular Disease

## 2013-11-07 ENCOUNTER — Encounter: Payer: Self-pay | Admitting: Cardiovascular Disease

## 2013-11-07 ENCOUNTER — Ambulatory Visit (INDEPENDENT_AMBULATORY_CARE_PROVIDER_SITE_OTHER): Payer: Medicare Other | Admitting: Cardiovascular Disease

## 2013-11-07 VITALS — BP 120/72 | HR 84 | Resp 16 | Ht 63.0 in | Wt 172.2 lb

## 2013-11-07 DIAGNOSIS — R001 Bradycardia, unspecified: Secondary | ICD-10-CM

## 2013-11-07 DIAGNOSIS — Z79899 Other long term (current) drug therapy: Secondary | ICD-10-CM

## 2013-11-07 DIAGNOSIS — I4891 Unspecified atrial fibrillation: Secondary | ICD-10-CM

## 2013-11-07 DIAGNOSIS — I495 Sick sinus syndrome: Secondary | ICD-10-CM

## 2013-11-07 DIAGNOSIS — Z950 Presence of cardiac pacemaker: Secondary | ICD-10-CM

## 2013-11-07 DIAGNOSIS — I498 Other specified cardiac arrhythmias: Secondary | ICD-10-CM

## 2013-11-07 DIAGNOSIS — I480 Paroxysmal atrial fibrillation: Secondary | ICD-10-CM

## 2013-11-07 LAB — MDC_IDC_ENUM_SESS_TYPE_INCLINIC
Battery Voltage: 3.03 V
Brady Statistic AP VS Percent: 99.36 %
Brady Statistic AS VP Percent: 0.05 %
Brady Statistic RA Percent Paced: 99.73 %
Brady Statistic RV Percent Paced: 0.42 %
Date Time Interrogation Session: 20150818144620
Lead Channel Impedance Value: 475 Ohm
Lead Channel Impedance Value: 589 Ohm
Lead Channel Pacing Threshold Amplitude: 1.25 V
Lead Channel Pacing Threshold Pulse Width: 0.4 ms
Lead Channel Sensing Intrinsic Amplitude: 27.5 mV
Lead Channel Sensing Intrinsic Amplitude: 27.75 mV
Lead Channel Setting Pacing Pulse Width: 0.4 ms
Lead Channel Setting Sensing Sensitivity: 4 mV
MDC IDC MSMT BATTERY REMAINING LONGEVITY: 111 mo
MDC IDC MSMT LEADCHNL RA IMPEDANCE VALUE: 399 Ohm
MDC IDC MSMT LEADCHNL RA SENSING INTR AMPL: 0.25 mV
MDC IDC MSMT LEADCHNL RA SENSING INTR AMPL: 2 mV
MDC IDC MSMT LEADCHNL RV IMPEDANCE VALUE: 684 Ohm
MDC IDC MSMT LEADCHNL RV PACING THRESHOLD AMPLITUDE: 0.625 V
MDC IDC MSMT LEADCHNL RV PACING THRESHOLD PULSEWIDTH: 0.4 ms
MDC IDC SET LEADCHNL RA PACING AMPLITUDE: 2 V
MDC IDC SET LEADCHNL RV PACING AMPLITUDE: 2.5 V
MDC IDC SET ZONE DETECTION INTERVAL: 350 ms
MDC IDC STAT BRADY AP VP PERCENT: 0.37 %
MDC IDC STAT BRADY AS VS PERCENT: 0.22 %
Zone Setting Detection Interval: 400 ms

## 2013-11-07 NOTE — Progress Notes (Signed)
Patient ID: Denise Pugh, female   DOB: 1928/12/18, 80 y.o.   MRN: [id number]     Reason for office visit Pacemaker followup, paroxysmal atrial fibrillation  Denise Pugh returns for followup roughly 3 months following implantation of a dual-chamber permanent pacemaker for tachycardia-bradycardia syndrome. She has hypertrophic cardiomyopathy with a "spade-like ventricle" and hyperdynamic left ventricular systolic function. She has had paroxysmal atrial fibrillation as well as atrial tachycardia but also symptomatic sinus bradycardia. She is on amiodarone therapy since April and is followed by Dr. Ellouise Newer.  Since pacemaker implantation she feels better. Today she has no complaints. She has not had palpitations, focal neurological events, bleeding complications on Eliquis and denies angina or dyspnea.  Underlying rhythm today is sinus bradycardia at 38 beats per minute and she has been pacing the atrium greater than 99% of the time. She virtually never has ventricular pacing. She has had 32 episodes of mode switch but they are all brief, maximum 16 minutes, for an overall burden of <0.1%. Battery and lead parameters are good. No permanent programming changes were necessary today.  Allergies  Allergen Reactions  . Penicillins Other (See Comments)    tingling  . Tetanus Toxoid Swelling    Current Outpatient Prescriptions  Medication Sig Dispense Refill  . acetaminophen (TYLENOL) 500 MG tablet Take 500 mg by mouth every 6 (six) hours as needed (pain). For fever      . ALPRAZolam (XANAX) 0.5 MG tablet Take 0.5 mg by mouth at bedtime.       Marland Kitchen amiodarone (PACERONE) 200 MG tablet Take two tablets twice a day for 7 days, then one tablet twice a day for 14 days, then one tablet daily.  50 tablet  5  . apixaban (ELIQUIS) 5 MG TABS tablet Take 1 tablet (5 mg total) by mouth 2 (two) times daily.  60 tablet  11  . Cholecalciferol (VITAMIN D) 2000 UNITS tablet Take 2,000 Units by mouth daily.      Marland Kitchen  dexlansoprazole (DEXILANT) 60 MG capsule Take 60 mg by mouth 2 (two) times daily.      Marland Kitchen docusate sodium (COLACE) 100 MG capsule Take 100 mg by mouth 2 (two) times daily.      Marland Kitchen levothyroxine (SYNTHROID, LEVOTHROID) 75 MCG tablet Take 75 mcg by mouth daily.      Marland Kitchen losartan-hydrochlorothiazide (HYZAAR) 100-12.5 MG per tablet Take 1 tablet by mouth daily.  90 tablet  2  . metoprolol (LOPRESSOR) 50 MG tablet Take 1 tablet (50 mg total) by mouth 2 (two) times daily.  60 tablet  11  . Multiple Vitamin (MULITIVITAMIN WITH MINERALS) TABS Take 1 tablet by mouth daily.      Marland Kitchen oxybutynin (DITROPAN-XL) 10 MG 24 hr tablet Take 10 mg by mouth at bedtime.       . rosuvastatin (CRESTOR) 10 MG tablet Take 5 mg by mouth at bedtime. Takes 1/2 tablet daily      . traMADol (ULTRAM) 50 MG tablet Take 100 mg by mouth 2 (two) times daily.       Marland Kitchen escitalopram (LEXAPRO) 5 MG tablet Take 5 mg by mouth daily.       No current facility-administered medications for this visit.    Past Medical History  Diagnosis Date  . Thyroid disease   . Arthritis   . Irregular heart beat   . Arrhythmia     HX of SVT. CARDIONET MONITOR 07/15/12 TO 08/13/12  . Sleep apnea     SLEEP STUDY-[facility]  . Hypertension 08/25/10    ECHO-EF 60-65%  . Hyperlipidemia 08/06/11    lexiscan myoview-normal. Due to severe attenuation the study specificity and sensitivty are deminished.  . Shortness of breath   . Hypothyroidism   . GERD (gastroesophageal reflux disease)     Past Surgical History  Procedure Laterality Date  . Knee arthroscopy    . Cardiac catheterization  07/06/01    spade-like configuration  . Eye surgery    . Dilation and curettage of uterus      Family History  Problem Relation Age of Onset  . Diabetes Mother   . Hypertension Father   . Diabetes Father   . Heart attack Maternal Grandmother   . Heart attack Maternal Grandfather     History   Social History  . Marital Status: Married     Spouse Name: N/A    Number of Children: N/A  . Years of Education: N/A   Occupational History  . Not on file.   Social History Main Topics  . Smoking status: Never Smoker   . Smokeless tobacco: Never Used  . Alcohol Use: No  . Drug Use: No  . Sexual Activity: No   Other Topics Concern  . Not on file   Social History Narrative  . No narrative on file    Review of systems: The patient specifically denies any chest pain at rest or with exertion, dyspnea at rest or with exertion, orthopnea, paroxysmal nocturnal dyspnea, syncope, palpitations, focal neurological deficits, intermittent claudication, lower extremity edema, unexplained weight gain, cough, hemoptysis or wheezing.  The patient also denies abdominal pain, nausea, vomiting, dysphagia, diarrhea, constipation, polyuria, polydipsia, dysuria, hematuria, frequency, urgency, abnormal bleeding or bruising, fever, chills, unexpected weight changes, mood swings, change in skin or hair texture, change in voice quality, auditory or visual problems, allergic reactions or rashes, new musculoskeletal complaints other than usual "aches and pains".   PHYSICAL EXAM BP 120/72  Pulse 84  Resp 16  Ht 5\' 3"  (1.6 m)  Wt 172 lb 3.2 oz (78.109 kg)  BMI 30.51 kg/m2  General: Alert, oriented x3, no distress Head: no evidence of trauma, PERRL, EOMI, no exophtalmos or lid lag, no myxedema, no xanthelasma; normal ears, nose and oropharynx Neck: normal jugular venous pulsations and no hepatojugular reflux; brisk carotid pulses without delay and no carotid bruits Chest: clear to auscultation, no signs of consolidation by percussion or palpation, normal fremitus, symmetrical and full respiratory excursions, the left subclavian pacemaker site Cardiovascular: normal position and quality of the apical impulse, regular rhythm, normal first and second heart sounds, no murmurs, rubs or gallops Abdomen: no tenderness or distention, no masses by palpation, no  abnormal pulsatility or arterial bruits, normal bowel sounds, no hepatosplenomegaly Extremities: no clubbing, cyanosis or edema; 2+ radial, ulnar and brachial pulses bilaterally; 2+ right femoral, posterior tibial and dorsalis pedis pulses; 2+ left femoral, posterior tibial and dorsalis pedis pulses; no subclavian or femoral bruits Neurological: grossly nonfocal  Lipid Panel     Component Value Date/Time   CHOL 120 07/19/2013 0539   TRIG 52 07/19/2013 0539   HDL 45 07/19/2013 0539   CHOLHDL 2.7 07/19/2013 0539   VLDL 10 07/19/2013 0539   LDLCALC 65 07/19/2013 0539    BMET    Component Value Date/Time   NA 140 07/19/2013 0539   K 4.3 07/19/2013 0539   CL 101 07/19/2013 0539   CO2 26 07/19/2013 0539   GLUCOSE 108* 07/19/2013 0539   BUN  27* 07/19/2013 0539   CREATININE 1.30* 07/19/2013 0539   CREATININE 1.38* 08/10/2012 1329   CALCIUM 8.6 07/19/2013 0539   GFRNONAA 37* 07/19/2013 0539   GFRAA 43* 07/19/2013 0539     ASSESSMENT AND PLAN  Normal function of recently implanted dual-chamber permanent pacemaker. Device now with chronic settings. Very low burden of atrial tachycardia arrhythmia. On full dose anticoagulation. Enroll in remote monitoring via [employer] in followup for her in the office pacemaker check on a yearly basis. She'll continue to followup with [name] for her structural heart problems. She should probably have liver function test checked with her upcoming appointment with Dr. [name]. By then she will have been on amiodarone for roughly 6 months.  Patient Instructions  Have your routine lab work done at Dr. Juel Burrow office.  Please have him draw a CMET.  Remote monitoring is used to monitor your pacemaker from home. This monitoring reduces the number of office visits required to check your device to one time per year. It allows [place] to keep an eye on the functioning of your device to ensure it is working properly. You are scheduled for a device check from home on 02-08-2014. You may  send your transmission at any time that day. If you have a wireless device, the transmission will be sent automatically. After your physician reviews your transmission, you will receive a postcard with your next transmission date.  Your physician recommends that you schedule a follow-up appointment in: 12 months with Dr.Starlena Beil       Orders Placed This Encounter  Procedures  . Comprehensive metabolic panel  . Implantable device check  . EKG 12-Lead   Meds ordered this encounter  Medications  . escitalopram (LEXAPRO) 5 MG tablet    Sig: Take 5 mg by mouth daily.    Holli Humbles, MD, [place] [phone] office [phone] pager

## 2013-11-07 NOTE — Patient Instructions (Addendum)
Have your routine lab work done at Dr. Juel Burrow office.  Please have him draw a CMET.  Remote monitoring is used to monitor your pacemaker from home. This monitoring reduces the number of office visits required to check your device to one time per year. It allows [place] to keep an eye on the functioning of your device to ensure it is working properly. You are scheduled for a device check from home on 02-08-2014. You may send your transmission at any time that day. If you have a wireless device, the transmission will be sent automatically. After your physician reviews your transmission, you will receive a postcard with your next transmission date.  Your physician recommends that you schedule a follow-up appointment in: 12 months with Dr.Croitoru

## 2013-11-24 ENCOUNTER — Telehealth: Payer: Self-pay | Admitting: Cardiovascular Disease

## 2013-11-24 NOTE — Telephone Encounter (Signed)
Pt called in wanting to know if it was necessary for her to keep her appt with [name] considering she was just in to see Dr. Loletha Grayer on 8/18. She had her [facility] checked and and EKG was ran.She wants to know because she would like to avoid paying another co-pay if possible. Please call  Thanks

## 2013-11-24 NOTE — Telephone Encounter (Signed)
Appt. With [name] to be moved to end of december

## 2013-11-28 ENCOUNTER — Ambulatory Visit: Payer: Self-pay | Admitting: Cardiovascular Disease

## 2013-12-04 ENCOUNTER — Telehealth: Payer: Self-pay | Admitting: Cardiovascular Disease

## 2013-12-04 ENCOUNTER — Encounter: Payer: Self-pay | Admitting: Cardiovascular Disease

## 2013-12-04 MED ORDER — APIXABAN 5 MG PO TABS: 5.0000 mg | ORAL_TABLET | Freq: Two times a day (BID) | ORAL | Status: DC

## 2013-12-04 NOTE — Telephone Encounter (Signed)
Spoke with patient and let her know samples are upfront for pick up

## 2013-12-04 NOTE — Telephone Encounter (Signed)
Pt would like some samples of Eliquis 5 mg please.

## 2014-01-24 ENCOUNTER — Other Ambulatory Visit (HOSPITAL_COMMUNITY): Payer: Self-pay | Admitting: Emergency Medicine

## 2014-01-24 ENCOUNTER — Ambulatory Visit (HOSPITAL_COMMUNITY)
Admission: RE | Admit: 2014-01-24 | Discharge: 2014-01-24 | Disposition: A | Discharge status: Home / Self Care | Payer: Medicare Other | Source: Ambulatory Visit | Attending: Internal Medicine | Admitting: Internal Medicine

## 2014-01-24 DIAGNOSIS — S5002XA Contusion of left elbow, initial encounter: Secondary | ICD-10-CM | POA: Diagnosis not present

## 2014-01-24 DIAGNOSIS — W1830XA Fall on same level, unspecified, initial encounter: Secondary | ICD-10-CM | POA: Insufficient documentation

## 2014-01-24 DIAGNOSIS — T148XXA Other injury of unspecified body region, initial encounter: Secondary | ICD-10-CM

## 2014-01-24 DIAGNOSIS — M25522 Pain in left elbow: Secondary | ICD-10-CM | POA: Diagnosis present

## 2014-02-08 ENCOUNTER — Ambulatory Visit (INDEPENDENT_AMBULATORY_CARE_PROVIDER_SITE_OTHER): Payer: Medicare Other | Admitting: *Deleted

## 2014-02-08 DIAGNOSIS — I495 Sick sinus syndrome: Secondary | ICD-10-CM

## 2014-02-08 NOTE — Progress Notes (Signed)
Remote pacemaker transmission.   

## 2014-02-09 LAB — MDC_IDC_ENUM_SESS_TYPE_REMOTE
Battery Remaining Longevity: 111 mo
Brady Statistic AP VP Percent: 0.46 %
Brady Statistic AP VS Percent: 99.29 %
Brady Statistic AS VP Percent: 0.03 %
Brady Statistic AS VS Percent: 0.23 %
Brady Statistic RV Percent Paced: 0.48 %
Date Time Interrogation Session: 20151119155828
Lead Channel Impedance Value: 551 Ohm
Lead Channel Impedance Value: 684 Ohm
Lead Channel Pacing Threshold Amplitude: 0.625 V
Lead Channel Pacing Threshold Pulse Width: 0.4 ms
Lead Channel Sensing Intrinsic Amplitude: 3.875 mV
Lead Channel Sensing Intrinsic Amplitude: 3.875 mV
Lead Channel Sensing Intrinsic Amplitude: 31.25 mV
Lead Channel Sensing Intrinsic Amplitude: 31.25 mV
Lead Channel Setting Pacing Amplitude: 2.5 V
Lead Channel Setting Pacing Pulse Width: 0.4 ms
Lead Channel Setting Sensing Sensitivity: 4 mV
MDC IDC MSMT BATTERY VOLTAGE: 3.03 V
MDC IDC MSMT LEADCHNL RA IMPEDANCE VALUE: 399 Ohm
MDC IDC MSMT LEADCHNL RA IMPEDANCE VALUE: 513 Ohm
MDC IDC MSMT LEADCHNL RA PACING THRESHOLD AMPLITUDE: 1.125 V
MDC IDC MSMT LEADCHNL RV PACING THRESHOLD PULSEWIDTH: 0.4 ms
MDC IDC SET LEADCHNL RA PACING AMPLITUDE: 2.25 V
MDC IDC STAT BRADY RA PERCENT PACED: 99.74 %
Zone Setting Detection Interval: 350 ms
Zone Setting Detection Interval: 400 ms

## 2014-02-13 ENCOUNTER — Encounter: Payer: Self-pay | Admitting: Cardiology

## 2014-02-27 ENCOUNTER — Ambulatory Visit (INDEPENDENT_AMBULATORY_CARE_PROVIDER_SITE_OTHER): Payer: Medicare Other | Admitting: Cardiovascular Disease

## 2014-02-27 ENCOUNTER — Encounter: Payer: Self-pay | Admitting: Cardiovascular Disease

## 2014-02-27 VITALS — BP 120/70 | HR 74 | Ht 63.0 in | Wt 173.2 lb

## 2014-02-27 DIAGNOSIS — I10 Essential (primary) hypertension: Secondary | ICD-10-CM

## 2014-02-27 DIAGNOSIS — I480 Paroxysmal atrial fibrillation: Secondary | ICD-10-CM

## 2014-02-27 DIAGNOSIS — I495 Sick sinus syndrome: Secondary | ICD-10-CM

## 2014-02-27 DIAGNOSIS — E039 Hypothyroidism, unspecified: Secondary | ICD-10-CM

## 2014-02-27 DIAGNOSIS — Z23 Encounter for immunization: Secondary | ICD-10-CM

## 2014-02-27 DIAGNOSIS — Z950 Presence of cardiac pacemaker: Secondary | ICD-10-CM

## 2014-02-27 DIAGNOSIS — E785 Hyperlipidemia, unspecified: Secondary | ICD-10-CM

## 2014-02-27 NOTE — Progress Notes (Signed)
Patient ID: Denise Pugh, female   DOB: Sep 18, 1928, 80 y.o.   MRN: [id number]     HPI: Denise Pugh is a 80 y.o. female who presents to the office today for followup cardiology evaluation.  [name] has a history of supraventricular tachycardia and moderate left ventricular hypertrophy with documented "spade-like ventricle."  She has previously documented diffuse T-wave abnormalities. A stress test in May 2013 showed breast attenuation artifact with a post stress ejection fraction of 70% without evidence for scar or ischemia. In March 2014  she developed leg swelling and I recommended the addition of torsemide 20 mg daily to her medical regimen recommended and discontinued her Voltaren. Subsequent laboratory on 07/15/2012 showed a creatinine of 1.72. She was advised to decrease her torsemide to every other day. She had experienced recurrent episodes of palpitations. A cardiac monitor revealed episodes of supraventricular tachycardia up to 185 beats per minute and her beta blocker dose was increased.  A CardioNet monitor on her increased dose of Toprol-XL 25 mg still revealed bursts of atrial tachycardia/A. flutter rate at 189 beats per minute.  On  May 17 she had an episode of tachycardia palpitations and did also have episodes of bradycardia; some of her spells suggested SVT at 150 sig in addition to episode of accelerated idioventricular rhythm when she was sinus bradycardic.  A 2-D echo Doppler study on 09/15/2012 showed an ejection fraction of 60-65%. She did have grade 1 diastolic dysfunction and elevated LV filling pressures.  The aortic valve was as sclerotic without stenosis and there was mild/moderate central regurgitation. 10 mild mitral regurgitation and mild tricuspid regurgitation with mild pulmonary hypertension with a PA estimate pressure at 47 mm.  In April 2015, she was admitted to [facility] with atrial fibrillation with a rapid ventricular response.  She also had a CardioNet  monitor which had shown episodes of sinus bradycardia, as well as PAF, with rates up to 170 beats per minute with also questionable nonsustained VT versus actual fibrillation with aberrancy.  She was started on amiodarone.  Her Eliquis was held, and she underwent permanent pacemaker insertion.  She was seen in August 2015 by Dr. Sallyanne Kuster for pacemaker follow-up.  Her underlying rhythm was sinus bradycardia at 38 bpm, and she was pacing the atrium greater than 99% of the time without hardly any episodes of ventricular pacing.  Presently, she admits to significant fatigue and decreased energy.  She denies any episodes of chest pain.  She recently tripped and fell on her face and stomach after she stumbled by her mailbox.  She specifically denies any prodrome of dizziness prior to her fall.  At that her foot stumbled by her mailbox.  Fortunately, she did not sustain any significant trauma since she was on Eliquis anticoagulation at the time of her fall.  Past Medical History  Diagnosis Date  . Thyroid disease   . Arthritis   . Irregular heart beat   . Arrhythmia     HX of SVT. CARDIONET MONITOR 07/15/12 TO 08/13/12  . Sleep apnea     SLEEP STUDY-[facility]  . Hypertension 08/25/10    ECHO-EF 60-65%  . Hyperlipidemia 08/06/11    lexiscan myoview-normal. Due to severe attenuation the study specificity and sensitivty are deminished.  . Shortness of breath   . Hypothyroidism   . GERD (gastroesophageal reflux disease)     Past Surgical History  Procedure Laterality Date  . Knee arthroscopy    . Cardiac catheterization  07/06/01    spade-like configuration  . Eye surgery    . Dilation and curettage of uterus      Allergies  Allergen Reactions  . Penicillins Other (See Comments)    tingling  . Tetanus Toxoid Swelling    Current Outpatient Prescriptions  Medication Sig Dispense Refill  . acetaminophen (TYLENOL) 500 MG tablet Take 500 mg by mouth as needed (pain). For  fever    . ALPRAZolam (XANAX) 0.5 MG tablet Take 0.5 mg by mouth at bedtime.     Marland Kitchen amiodarone (PACERONE) 200 MG tablet Take two tablets twice a day for 7 days, then one tablet twice a day for 14 days, then one tablet daily. (Patient taking differently: Take 200 mg by mouth daily. ) 50 tablet 5  . apixaban (ELIQUIS) 5 MG TABS tablet Take 1 tablet (5 mg total) by mouth 2 (two) times daily. 42 tablet 0  . Cholecalciferol (VITAMIN D) 2000 UNITS tablet Take 2,000 Units by mouth daily.    Marland Kitchen docusate sodium (COLACE) 100 MG capsule Take 100 mg by mouth 2 (two) times daily.    Marland Kitchen escitalopram (LEXAPRO) 5 MG tablet Take 5 mg by mouth daily.    . Lansoprazole (PREVACID PO) Take by mouth daily.    Marland Kitchen levothyroxine (SYNTHROID, LEVOTHROID) 75 MCG tablet Take 75 mcg by mouth daily.    Marland Kitchen losartan-hydrochlorothiazide (HYZAAR) 100-12.5 MG per tablet Take 1 tablet by mouth daily. 90 tablet 2  . metoprolol (LOPRESSOR) 50 MG tablet Take 1 tablet (50 mg total) by mouth 2 (two) times daily. 60 tablet 11  . Multiple Vitamin (MULITIVITAMIN WITH MINERALS) TABS Take 1 tablet by mouth daily.    Marland Kitchen oxybutynin (DITROPAN-XL) 10 MG 24 hr tablet Take 10 mg by mouth at bedtime.     . rosuvastatin (CRESTOR) 10 MG tablet Take 5 mg by mouth at bedtime. Takes 1/2 tablet daily    . traMADol (ULTRAM) 50 MG tablet Take 100 mg by mouth 2 (two) times daily. Take 2 tablets twice daily     No current facility-administered medications for this visit.   Social history is notable in that she is now. Her husband did suffer a CVA and has been having more difficulty at home. She has one child, 2 step sons, one grandchild and 2 great-grandchildren. There is no tobacco or alcohol use. She does admit to frequent anxiety.  ROS General: Negative; No fevers, chills, or night sweats; positive for fatigue HEENT: Negative; No changes in vision or hearing, sinus congestion, difficulty swallowing Pulmonary: Negative; No cough, wheezing, shortness of breath,  hemoptysis Cardiovascular: Negative; No chest pain, presyncope, syncope, palpitations GI: Negative; No nausea, vomiting, diarrhea, or abdominal pain GU: Negative; No dysuria, hematuria, or difficulty voiding Musculoskeletal: Negative; no myalgias, joint pain, or weakness Hematologic/Oncology: Negative; no easy bruising, bleeding Endocrine: Negative; no heat/cold intolerance; no diabetes Neuro: Negative; no changes in balance, headaches Skin: Negative; No rashes or skin lesions Psychiatric: Negative; No behavioral problems, depression Sleep: Negative; No snoring, daytime sleepiness, hypersomnolence, bruxism, restless legs, hypnogognic hallucinations, no cataplexy Other comprehensive 14 point system review is negative.   PE BP 120/70 mmHg  Pulse 74  Ht 5\' 3"  (1.6 m)  Wt 173 lb 3.2 oz (78.563 kg)  BMI 30.69 kg/m2  Repeat blood pressure by me was 140/70. General: Alert, oriented, no distress.  HEENT: Normocephalic, atraumatic. Pupils round and reactive; sclera anicteric; Mouth/Parynx benign; Mallinpatti scale 2 Neck: No JVD, no carotid bruits Lungs: clear to ausculatation and percussion; no  wheezing or rales Chest wall: Nontender to palpation  Heart: RRR, s1 s2 normal 2/6 SEM in the aortic region. No diastolic murmur the; no s3; no rubs thrills or heaves Abdomen: soft, nontender; no hepatosplenomehaly, BS+; abdominal aorta nontender and not dilated by palpation. Mildly obese Back: No CVA tenderness Pulses 2+ Extremities: no clubbinbg cyanosis or edema, Homan's sign negative  Neurologic: grossly nonfocal Psychologic: Normal affect.  ECG (independently read by me): Atrially paced rhythm with ventricular sensing with incomplete right bundle branch block.  Previously noted T-wave abnormalities  07/24/2013 ECG: Atrial lead paced rhythm.  Mild RV conduction delay.  Diffuse T-wave inversion has been present previously  Prior ECG today  (independently read by me): Bradycardia at 48 beats  per minute. RV conduction delay. Previously noted diffuse inferior and anterolateral T wave inversion  Prior ECG of 01/20/2013: sinus bradycardia at 55 beats per minute. Previously noted diffuse T-wave inversion in leads 46F, V2 through V6. Mild RV conduction delay.  LABS: Basic Metabolic Panel: No results for input(s): NA, K, CL, CO2, GLUCOSE, BUN, CREATININE, CALCIUM, MG, PHOS in the last 72 hours. Liver Function Tests: No results for input(s): AST, ALT, ALKPHOS, BILITOT, PROT, ALBUMIN in the last 72 hours. No results for input(s): LIPASE, AMYLASE in the last 72 hours. CBC: No results for input(s): WBC, NEUTROABS, HGB, HCT, MCV, PLT in the last 72 hours. Cardiac Enzymes: No results for input(s): CKTOTAL, CKMB, CKMBINDEX, TROPONINI in the last 72 hours. BNP: Invalid input(s): POCBNP D-Dimer: No results for input(s): DDIMER in the last 72 hours. Hemoglobin A1C: No results for input(s): HGBA1C in the last 72 hours. Fasting Lipid Panel: No results for input(s): CHOL, HDL, LDLCALC, TRIG, CHOLHDL, LDLDIRECT in the last 72 hours. Thyroid Function Tests: No results for input(s): TSH, T4TOTAL, T3FREE, THYROIDAB in the last 72 hours.  Invalid input(s): FREET3 Anemia Panel: No results for input(s): VITAMINB12, FOLATE, FERRITIN, TIBC, IRON, RETICCTPCT in the last 72 hours.  RADIOLOGY: No results found.    ASSESSMENT AND PLAN:   Denise Pugh is an 80 year old Caucasian female with a history of SVT and documented moderate left ventricle hypertrophy the with a "spade-like ventricle. " She has diffuse T-wave abnormalities which have been present for years. In 2003 cardiac catheterization did not demonstrate coronary obstructive disease but she had very minimal midsystolic bridging not felt to be significant in her LAD territory.  She has a history of SVT, PAF, and had developed bradycardia arrhythmia resulting in her permanent pacemaker implantation earlier this year.  She is now 100% atrially  paced.  She is unaware of any tachycardic spells.  She admits to fatigue.  Her blood pressure today is stable at 130/70.  She denies presyncope.  Her recent fall was a trip and stumble rather than a presyncopal/syncopal spell.  She is tolerating current or for hyperlipidemia.  She is on Synthroid 75 g for hypothyroidism.  She continues to be on amiodarone and denies any recurrent episodes of AF.  She is not having any bleeding and is tolerating Eliquis for anticoagulation.  I will see her in 6 months for reevaluation or sooner if problems arise.  Time spent: 25 minutes  Troy Sine M.D., [facility] 02/27/2014 2:47 PM

## 2014-02-27 NOTE — Patient Instructions (Signed)
Your physician wants you to follow-up in: 6 months or sooner if needed with Dr. Kelly. You will receive a reminder letter in the mail two months in advance. If you don't receive a letter, please call our office to schedule the follow-up appointment. 

## 2014-02-28 DIAGNOSIS — E785 Hyperlipidemia, unspecified: Secondary | ICD-10-CM | POA: Insufficient documentation

## 2014-02-28 DIAGNOSIS — E782 Mixed hyperlipidemia: Secondary | ICD-10-CM | POA: Insufficient documentation

## 2014-02-28 MED ORDER — AMIODARONE HCL 200 MG PO TABS: 200.0000 mg | ORAL_TABLET | Freq: Every day | ORAL | Status: DC

## 2014-03-01 ENCOUNTER — Encounter (HOSPITAL_COMMUNITY): Payer: Self-pay | Admitting: Cardiovascular Disease

## 2014-03-01 ENCOUNTER — Encounter: Payer: Self-pay | Admitting: Cardiovascular Disease

## 2014-03-07 ENCOUNTER — Encounter: Payer: Self-pay | Admitting: Cardiovascular Disease

## 2014-04-02 ENCOUNTER — Other Ambulatory Visit: Payer: Self-pay | Admitting: *Deleted

## 2014-04-02 ENCOUNTER — Telehealth: Payer: Self-pay | Admitting: Cardiovascular Disease

## 2014-04-02 MED ORDER — ROSUVASTATIN CALCIUM 10 MG PO TABS: 5.0000 mg | ORAL_TABLET | Freq: Every day | ORAL | Status: DC

## 2014-04-02 MED ORDER — AMIODARONE HCL 200 MG PO TABS: 200.0000 mg | ORAL_TABLET | Freq: Every day | ORAL | Status: DC

## 2014-04-02 MED ORDER — METOPROLOL TARTRATE 50 MG PO TABS: 50.0000 mg | ORAL_TABLET | Freq: Two times a day (BID) | ORAL | Status: DC

## 2014-04-02 MED ORDER — LOSARTAN POTASSIUM-HCTZ 100-12.5 MG PO TABS: 1.0000 | ORAL_TABLET | Freq: Every day | ORAL | Status: DC

## 2014-04-02 NOTE — Telephone Encounter (Signed)
Pt called in requesting samples of Eliquis 5 mg. Please call  thanks

## 2014-04-02 NOTE — Telephone Encounter (Signed)
Spoke with pt, no samples of eliquis available.

## 2014-04-05 ENCOUNTER — Other Ambulatory Visit: Payer: Self-pay | Admitting: *Deleted

## 2014-04-05 MED ORDER — LOSARTAN POTASSIUM-HCTZ 100-12.5 MG PO TABS: 1.0000 | ORAL_TABLET | Freq: Every day | ORAL | Status: DC

## 2014-04-05 MED ORDER — ROSUVASTATIN CALCIUM 5 MG PO TABS: 5.0000 mg | ORAL_TABLET | Freq: Every day | ORAL | Status: DC

## 2014-04-05 NOTE — Telephone Encounter (Signed)
Called patient to clarify was she taking the (Losartan) or (Losartan hydrochlorothiazide) she stated she was taking the (Losartan hydrochlorothiazide) and also she stated that she was only taking half of the (crestor) 10mg , so we reordered her (CRESTOR 5mg ) RX where  Sent to [facility].

## 2014-04-12 ENCOUNTER — Telehealth: Payer: Self-pay | Admitting: Cardiovascular Disease

## 2014-04-12 NOTE — Telephone Encounter (Signed)
John called in wanting to get some clarification on pt's Lopressor, Pacerone, and Lexapro prescriptions. Please call

## 2014-04-12 NOTE — Telephone Encounter (Signed)
Paper copy received Dr Claiborne Billings out of town, will address upon his returned.

## 2014-04-16 ENCOUNTER — Telehealth: Payer: Self-pay | Admitting: Cardiovascular Disease

## 2014-04-16 MED ORDER — METOPROLOL TARTRATE 50 MG PO TABS: 50.0000 mg | ORAL_TABLET | Freq: Two times a day (BID) | ORAL | Status: DC

## 2014-04-16 NOTE — Telephone Encounter (Signed)
Pt is on both meds; if HR is sloww will need to decrease metoprolol

## 2014-04-16 NOTE — Telephone Encounter (Signed)
Submitted refill for pt, called [facility], they wanted [name] to be aware it looks like patient is prescribed both amiodarone and metoprolol, wanted to know if she is OK to take both or if one should be discontinued.

## 2014-04-16 NOTE — Telephone Encounter (Signed)
Pt called in stating that she has not received her Metoprolol from [facility] yet because she says that someone our office told the pharmacist form [facility], that her prescription has been discontinued, which is not true. Please follow up  Thanks

## 2014-04-17 NOTE — Telephone Encounter (Signed)
[name] aware of pt's current medication regimen, I called [facility] to state, this is acknowledged by [facility]. Called pt and discussed, she understands to call if having issues but denies current SE's. She has been on both medications for several months w/ no problems. Voiced no other concerns.

## 2014-05-08 ENCOUNTER — Ambulatory Visit (INDEPENDENT_AMBULATORY_CARE_PROVIDER_SITE_OTHER): Payer: Medicare Other | Admitting: Cardiology

## 2014-05-08 ENCOUNTER — Ambulatory Visit (INDEPENDENT_AMBULATORY_CARE_PROVIDER_SITE_OTHER): Payer: Medicare Other | Admitting: *Deleted

## 2014-05-08 ENCOUNTER — Encounter: Payer: Self-pay | Admitting: Cardiology

## 2014-05-08 VITALS — BP 150/60 | HR 84 | Ht 63.0 in | Wt 173.7 lb

## 2014-05-08 DIAGNOSIS — I4891 Unspecified atrial fibrillation: Secondary | ICD-10-CM | POA: Diagnosis not present

## 2014-05-08 DIAGNOSIS — R001 Bradycardia, unspecified: Secondary | ICD-10-CM

## 2014-05-08 DIAGNOSIS — I495 Sick sinus syndrome: Secondary | ICD-10-CM | POA: Diagnosis not present

## 2014-05-08 DIAGNOSIS — R5383 Other fatigue: Secondary | ICD-10-CM

## 2014-05-08 LAB — TSH: TSH: 1.771 u[IU]/mL (ref 0.350–4.500)

## 2014-05-08 LAB — BASIC METABOLIC PANEL WITH GFR
BUN: 23 mg/dL (ref 6–23)
CO2: 33 mEq/L — ABNORMAL HIGH (ref 19–32)
CREATININE: 0.98 mg/dL (ref 0.50–1.10)
Calcium: 8.9 mg/dL (ref 8.4–10.5)
Chloride: 95 mEq/L — ABNORMAL LOW (ref 96–112)
GFR, EST AFRICAN AMERICAN: 62 mL/min
GFR, EST NON AFRICAN AMERICAN: 54 mL/min — AB
Glucose, Bld: 84 mg/dL (ref 70–99)
POTASSIUM: 4.9 meq/L (ref 3.5–5.3)
Sodium: 135 mEq/L (ref 135–145)

## 2014-05-08 LAB — CBC
HEMATOCRIT: 43.5 % (ref 36.0–46.0)
HEMOGLOBIN: 14.4 g/dL (ref 12.0–15.0)
MCH: 30.4 pg (ref 26.0–34.0)
MCHC: 33.1 g/dL (ref 30.0–36.0)
MCV: 92 fL (ref 78.0–100.0)
MPV: 10.3 fL (ref 8.6–12.4)
Platelets: 210 10*3/uL (ref 150–400)
RBC: 4.73 MIL/uL (ref 3.87–5.11)
RDW: 14.7 % (ref 11.5–15.5)
WBC: 6.2 10*3/uL (ref 4.0–10.5)

## 2014-05-08 LAB — T4, FREE: FREE T4: 1.75 ng/dL (ref 0.80–1.80)

## 2014-05-08 NOTE — Progress Notes (Signed)
Cardiology Office Note   Date:  05/08/2014   ID:  Denise Pugh, DOB Jan 27, 1929, MRN [id number]  PCP:  Delphina Cahill, MD  Cardiologist:  [name]    Chief Complaint  Patient presents with  . Fatigue  . Palpitations      History of Present Illness: Denise Pugh is a 81 y.o. female who presents for palpitations and worried that she has damaged the pacemaker.   She has a history of supraventricular tachycardia and moderate left ventricular hypertrophy with documented "spade-like ventricle." She has previously documented diffuse T-wave abnormalities. A stress test in May 2013 showed breast attenuation artifact with a post stress ejection fraction of 70% without evidence for scar or ischemia. In March 2014 she developed leg swelling and Dr. Corky Downs recommended the addition of torsemide 20 mg daily to her medical regimen recommended and discontinued her Voltaren. Subsequent laboratory on 07/15/2012 showed a creatinine of 1.72. She was advised to decrease her torsemide to every other day. She had experienced recurrent episodes of palpitations. A cardiac monitor revealed episodes of supraventricular tachycardia up to 185 beats per minute and her beta blocker dose was increased. A CardioNet monitor on her increased dose of Toprol-XL 25 mg still revealed bursts of atrial tachycardia/A. flutter rate at 189 beats per minute. On May 17 she had an episode of tachycardia palpitations and did also have episodes of bradycardia; some of her spells suggested SVT at 150 sig in addition to episode of accelerated idioventricular rhythm when she was sinus bradycardic.  A 2-D echo Doppler study on 09/15/2012 showed an ejection fraction of 60-65%. She did have grade 1 diastolic dysfunction and elevated LV filling pressures. The aortic valve was as sclerotic without stenosis and there was mild/moderate central regurgitation. 10 mild mitral regurgitation and mild tricuspid regurgitation with mild pulmonary  hypertension with a PA estimate pressure at 47 mm.  She has PPM followed by Dr. Sallyanne Kuster. Last checked 02/2014.  Today she stated she may be paranoid but she is worried about her pacemaker.  She admits to increased anxiety since the death of her husband.  She may have had an episode of palpitations.  She is worried she will damage the pacer.  She complains of fatigue.  No chest pain and no SOB.  Also with constipation. She is worried she will be alone and have problems.       Past Medical History  Diagnosis Date  . Thyroid disease   . Arthritis   . Irregular heart beat   . Arrhythmia     HX of SVT. CARDIONET MONITOR 07/15/12 TO 08/13/12  . Sleep apnea     SLEEP STUDY-[facility]  . Hypertension 08/25/10    ECHO-EF 60-65%  . Hyperlipidemia 08/06/11    lexiscan myoview-normal. Due to severe attenuation the study specificity and sensitivty are deminished.  . Shortness of breath   . Hypothyroidism   . GERD (gastroesophageal reflux disease)     Past Surgical History  Procedure Laterality Date  . Knee arthroscopy    . Cardiac catheterization  07/06/01    spade-like configuration  . Eye surgery    . Dilation and curettage of uterus    . Permanent pacemaker insertion N/A 07/20/2013    Procedure: PERMANENT PACEMAKER INSERTION;  Surgeon: [name], MD;  Location: [place] [facility];  Service: Cardiovascular;  Laterality: N/A;     Current Outpatient Prescriptions  Medication Sig Dispense Refill  . acetaminophen (TYLENOL) 500 MG tablet Take  500 mg by mouth as needed (pain). For fever    . ALPRAZolam (XANAX) 0.5 MG tablet Take 0.5 mg by mouth at bedtime.     Marland Kitchen amiodarone (PACERONE) 200 MG tablet Take 1 tablet (200 mg total) by mouth daily. 90 tablet 2  . apixaban (ELIQUIS) 5 MG TABS tablet Take 1 tablet (5 mg total) by mouth 2 (two) times daily. 42 tablet 0  . Cholecalciferol (VITAMIN D) 2000 UNITS tablet Take 2,000 Units by mouth daily.    Marland Kitchen dexlansoprazole (DEXILANT)  60 MG capsule Take 60 mg by mouth daily.    Marland Kitchen docusate sodium (COLACE) 100 MG capsule Take 100 mg by mouth 2 (two) times daily.    Marland Kitchen escitalopram (LEXAPRO) 5 MG tablet Take 5 mg by mouth daily.    Marland Kitchen levothyroxine (SYNTHROID, LEVOTHROID) 75 MCG tablet Take 75 mcg by mouth daily.    Marland Kitchen losartan-hydrochlorothiazide (HYZAAR) 100-12.5 MG per tablet Take 1 tablet by mouth daily. 90 tablet 3  . metoprolol (LOPRESSOR) 50 MG tablet Take 1 tablet (50 mg total) by mouth 2 (two) times daily. 180 tablet 2  . Multiple Vitamin (MULITIVITAMIN WITH MINERALS) TABS Take 1 tablet by mouth daily.    . rosuvastatin (CRESTOR) 5 MG tablet Take 1 tablet (5 mg total) by mouth daily. 90 tablet 3  . traMADol (ULTRAM) 50 MG tablet Take 100 mg by mouth 2 (two) times daily. Take 2 tablets twice daily     No current facility-administered medications for this visit.    Allergies:   Penicillins and Tetanus toxoid    Social History:  The patient  reports that she has never smoked. She has never used smokeless tobacco. She reports that she does not drink alcohol or use illicit drugs. Her husband died in last year or so and she has increased anxiety.  Family History:  The patient's family history includes Diabetes in her father and mother; Heart attack in her maternal grandfather and maternal grandmother; Hypertension in her father.    ROS:  General:no colds or fevers, no weight changes Skin:no rashes or ulcers HEENT:no blurred vision, no congestion CV:see HPI PUL:see HPI GI:no diarrhea constipation or melena, no indigestion GU:no hematuria, no dysuria MS:no joint pain, no claudication Neuro:no syncope, no lightheadedness Endo:no diabetes, + thyroid disease   Wt Readings from Last 3 Encounters:  05/08/14 173 lb 11.2 oz (78.79 kg)  02/27/14 173 lb 3.2 oz (78.563 kg)  11/07/13 172 lb 3.2 oz (78.109 kg)     PHYSICAL EXAM: VS:  BP 150/60 mmHg  Pulse 84  Ht 5\' 3"  (1.6 m)  Wt 173 lb 11.2 oz (78.79 kg)  BMI 30.78  kg/m2 , BMI Body mass index is 30.78 kg/(m^2). BP recheck 138/76 General:Pleasant affect, NAD Skin:Warm and dry, brisk capillary refill HEENT:normocephalic, sclera clear, mucus membranes moist Neck:supple, no JVD, no bruits  Heart:S1S2 RRR without murmur, gallup, rub or click, pacer is stable in chest wall Lungs:clear without rales, rhonchi, or wheezes ELF:YBOF, non tender, + BS, do not palpate liver spleen or masses Ext:no lower ext edema, 2+ pedal pulses, 2+ radial pulses Neuro:alert and oriented, MAE, follows commands, + facial symmetry   EKG:  EKG is ordered today. The ekg ordered today demonstrates SR with atrially paced, 1st degree AV block PR 256 ms.  No acute changes   Pacer interrogated with [facility] express- last a fib was in October 2015.    Recent Labs: 07/18/2013: ALT 31; Magnesium 2.2; Pro B Natriuretic peptide (BNP) 5333.0*; TSH 2.020  07/19/2013: BUN 27*; Creatinine 1.30*; Potassium 4.3; Sodium 140 07/21/2013: Hemoglobin 10.6*; Platelets 174    Lipid Panel    Component Value Date/Time   CHOL 120 07/19/2013 0539   TRIG 52 07/19/2013 0539   HDL 45 07/19/2013 0539   CHOLHDL 2.7 07/19/2013 0539   VLDL 10 07/19/2013 0539   LDLCALC 65 07/19/2013 0539       Other studies Reviewed: Additional studies/ records that were reviewed today include:last pacer check.  Last OV. Echo.   ASSESSMENT AND PLAN:  1.  Palpitations:  None seen on pacemaker interrogation.  Reassured.  2. Pacer placement, reassured that in correct place and interrogation is stable.  Reassured she can do activities like she normally would with the pacemaker.    3.  Anxiety, admits to anxiety, reassured about her heart and pacer, discussed lifeline at home or similar device.  4. Fatigue- she is on amiodarone, will recheck thyroid and labs to make sure stable.  5. Hx tachy brady now with PPM- stable.  6. anticoagulation with eliquis no GI bleeding or GU. For PAF  7. PAF no recent episodes.   8.  Constipation- discussed stool softner and activia yogurt with probiotics.  9.  HTN, elevated initially but recheck after resting was normal.   Current medicines are reviewed with the patient today.  The patient has no concerns regarding medicines.  The following changes have been made:  See above  Labs/ tests ordered today include:see above  Disposition:   FU:  see above  Lennie Muckle, NP  05/08/2014 11:43 AM    [facility] [place], [place], [place], [place] Phone: [phone]; Fax: [phone]

## 2014-05-08 NOTE — Patient Instructions (Addendum)
Your physician recommends that you schedule a follow-up appointment in: [place] NP  Your physician recommends that you HAVE LAB WORK TODAY   EAT AVTIVIA YOGURT DAILY TO HELP WITH CONSTIPATION

## 2014-05-09 ENCOUNTER — Telehealth: Payer: Self-pay | Admitting: Cardiology

## 2014-05-09 NOTE — Telephone Encounter (Signed)
Pt is returning the nurse's call in regards to some lab results. Please f/u  Thanks

## 2014-05-09 NOTE — Telephone Encounter (Signed)
Reviewed lab results with patient, she voiced understanding.

## 2014-05-11 DIAGNOSIS — Z6831 Body mass index (BMI) 31.0-31.9, adult: Secondary | ICD-10-CM | POA: Diagnosis not present

## 2014-05-11 DIAGNOSIS — M545 Low back pain: Secondary | ICD-10-CM | POA: Diagnosis not present

## 2014-05-14 ENCOUNTER — Ambulatory Visit (INDEPENDENT_AMBULATORY_CARE_PROVIDER_SITE_OTHER): Payer: Self-pay | Admitting: *Deleted

## 2014-05-14 DIAGNOSIS — I495 Sick sinus syndrome: Secondary | ICD-10-CM

## 2014-05-14 NOTE — Progress Notes (Signed)
Remote pacemaker transmission.   

## 2014-05-21 LAB — MDC_IDC_ENUM_SESS_TYPE_INCLINIC
Battery Remaining Longevity: 105 mo
Battery Voltage: 3.02 V
Brady Statistic AP VP Percent: 0.35 %
Brady Statistic AS VS Percent: 0.02 %
Lead Channel Impedance Value: 494 Ohm
Lead Channel Impedance Value: 627 Ohm
Lead Channel Impedance Value: 665 Ohm
Lead Channel Pacing Threshold Pulse Width: 0.4 ms
Lead Channel Pacing Threshold Pulse Width: 0.4 ms
Lead Channel Sensing Intrinsic Amplitude: 28.75 mV
Lead Channel Sensing Intrinsic Amplitude: 28.75 mV
Lead Channel Setting Pacing Amplitude: 2.25 V
Lead Channel Setting Pacing Amplitude: 2.5 V
Lead Channel Setting Pacing Pulse Width: 0.4 ms
MDC IDC MSMT LEADCHNL RA IMPEDANCE VALUE: 475 Ohm
MDC IDC MSMT LEADCHNL RA PACING THRESHOLD AMPLITUDE: 1 V
MDC IDC MSMT LEADCHNL RA SENSING INTR AMPL: 1.25 mV
MDC IDC MSMT LEADCHNL RA SENSING INTR AMPL: 2.375 mV
MDC IDC MSMT LEADCHNL RV PACING THRESHOLD AMPLITUDE: 0.75 V
MDC IDC SESS DTM: 20160222173640
MDC IDC SET LEADCHNL RV SENSING SENSITIVITY: 4 mV
MDC IDC SET ZONE DETECTION INTERVAL: 350 ms
MDC IDC STAT BRADY AP VS PERCENT: 99.63 %
MDC IDC STAT BRADY AS VP PERCENT: 0 %
MDC IDC STAT BRADY RA PERCENT PACED: 99.98 %
MDC IDC STAT BRADY RV PERCENT PACED: 0.35 %
Zone Setting Detection Interval: 400 ms

## 2014-05-21 NOTE — Progress Notes (Signed)
Pacemaker check in clinic w/ Cecilie Kicks, NP (industry checked). Device function reviewed. Impedance, sensing, and thresholds consistent with previous measurements. Histograms appropriate for patient and level of activity. All other diagnostic data reviewed and is appropriate and stable for patient. Real time EGM shows appropriate sensing and capture. 1 AT/AF episode x 3 mins, Max A 184/91 + Amio/Eliquis. 1 "NSVT" episode x 1 sec @ 146/154---SVT. Estimated longevity 8.5 years. Plan to follow up via [facility] on 5-17 and with Wayne in 10-2014.

## 2014-05-25 ENCOUNTER — Encounter: Payer: Self-pay | Admitting: Cardiovascular Disease

## 2014-05-28 ENCOUNTER — Telehealth: Payer: Self-pay | Admitting: Cardiovascular Disease

## 2014-05-28 NOTE — Telephone Encounter (Signed)
Medication samples have been provided to the patient.  Drug name: Eliquis 5mg   Qty: 56  LOT: [id number]  Exp.Date: 07/2016  Samples left at front desk for patient pick-up. Patient notified.  Sheral Apley M 3:52 PM 05/28/2014

## 2014-05-28 NOTE — Telephone Encounter (Signed)
Pt would like some samples of Eliquis please. °

## 2014-05-31 ENCOUNTER — Other Ambulatory Visit (HOSPITAL_COMMUNITY): Payer: Self-pay | Admitting: Internal Medicine

## 2014-05-31 DIAGNOSIS — M545 Low back pain, unspecified: Secondary | ICD-10-CM

## 2014-06-04 ENCOUNTER — Telehealth: Payer: Self-pay | Admitting: Cardiovascular Disease

## 2014-06-04 NOTE — Telephone Encounter (Signed)
Spoke with pt, prior authorization is complete on the eliquis

## 2014-06-04 NOTE — Telephone Encounter (Signed)
Denise Pugh is calling to get a prior auth for her Eliquis . Please call at [phone] for the prior auth [facility])    Thanks

## 2014-06-04 NOTE — Telephone Encounter (Signed)
Pt called in stating that an authorization is needed to move her Eliquis from a Tier 3 med to a Tier 2 med. She has become quite distraught over this situation and would like to get this taken care of as soon as possible.   Thanks

## 2014-06-07 NOTE — Telephone Encounter (Signed)
PA for eliquis 5mg  BID approved thru 06/04/15 under Medicare Part D

## 2014-06-11 ENCOUNTER — Telehealth: Payer: Self-pay | Admitting: Cardiovascular Disease

## 2014-06-11 ENCOUNTER — Encounter: Payer: Self-pay | Admitting: Cardiology

## 2014-06-11 ENCOUNTER — Ambulatory Visit (INDEPENDENT_AMBULATORY_CARE_PROVIDER_SITE_OTHER): Payer: Medicare Other | Admitting: Cardiology

## 2014-06-11 VITALS — BP 140/72 | HR 80 | Ht 63.0 in | Wt 177.4 lb

## 2014-06-11 DIAGNOSIS — R0609 Other forms of dyspnea: Secondary | ICD-10-CM | POA: Diagnosis not present

## 2014-06-11 NOTE — Addendum Note (Signed)
Addended by: Tiajuana Amass on: 06/11/2014 02:28 PM   Modules accepted: Level of Service

## 2014-06-11 NOTE — Patient Instructions (Signed)
Your physician has requested that you have an echocardiogram. Echocardiography is a painless test that uses sound waves to create images of your heart. It provides your doctor with information about the size and shape of your heart and how well your heart's chambers and valves are working. This procedure takes approximately one hour. There are no restrictions for this procedure.  Your physician recommends that you schedule a follow-up appointment in: 6-8 weeks with Dr.Kelly

## 2014-06-11 NOTE — Telephone Encounter (Signed)
Patient is having MRI on March 29.  She was told to talk to her physician about discontinuing her Eliquis prior to the test. She needs to know how long to discontinue.

## 2014-06-11 NOTE — Telephone Encounter (Signed)
Returned call to patient no answer.LMTC. 

## 2014-06-11 NOTE — Progress Notes (Addendum)
Cardiology Office Note   Date:  06/11/2014   ID:  Denise Pugh, DOB 1928/10/10, MRN [id number]  PCP:  Delphina Cahill, MD  Cardiologist:  [name]    Chief Complaint  Patient presents with  . DOE    1 month. dyspnea with activity      History of Present Illness: Denise Pugh is a 81 y.o. female who presents for follow up.  1 month ago she was worried about her pacemaker.  We checked her labs and they were normal. Today follow up of labs and she complains of DOE.  Only with exertion. No chest pain.  She has not been exercising due to back and knee pain.  The DOE may be deconditioning but we will check echo to make sure no change in EF.  She has a history of supraventricular tachycardia and moderate left ventricular hypertrophy with documented "spade-like ventricle." She has previously documented diffuse T-wave abnormalities. A stress test in May 2013 showed breast attenuation artifact with a post stress ejection fraction of 70% without evidence for scar or ischemia. In March 2014 she developed leg swelling and Dr. Corky Downs recommended the addition of torsemide 20 mg daily to her medical regimen recommended and discontinued her Voltaren. Subsequent laboratory on 07/15/2012 showed a creatinine of 1.72. She was advised to decrease her torsemide to every other day. She had experienced recurrent episodes of palpitations. A cardiac monitor revealed episodes of supraventricular tachycardia up to 185 beats per minute and her beta blocker dose was increased. A CardioNet monitor on her increased dose of Toprol-XL 25 mg still revealed bursts of atrial tachycardia/A. flutter rate at 189 beats per minute. On May 17 she had an episode of tachycardia palpitations and did also have episodes of bradycardia; some of her spells suggested SVT at 150 sig in addition to episode of accelerated idioventricular rhythm when she was sinus bradycardic.  A 2-D echo Doppler study on 09/15/2012 showed an ejection  fraction of 60-65%. She did have grade 1 diastolic dysfunction and elevated LV filling pressures. The aortic valve was as sclerotic without stenosis and there was mild/moderate central regurgitation. 10 mild mitral regurgitation and mild tricuspid regurgitation with mild pulmonary hypertension with a PA estimate pressure at 47 mm.  She has PPM followed by Dr. Sallyanne Kuster. Last checked Feb. 2016.   Past Medical History  Diagnosis Date  . Thyroid disease   . Arthritis   . Irregular heart beat   . Arrhythmia     HX of SVT. CARDIONET MONITOR 07/15/12 TO 08/13/12  . Sleep apnea     SLEEP STUDY-[facility]  . Hypertension 08/25/10    ECHO-EF 60-65%  . Hyperlipidemia 08/06/11    lexiscan myoview-normal. Due to severe attenuation the study specificity and sensitivty are deminished.  . Shortness of breath   . Hypothyroidism   . GERD (gastroesophageal reflux disease)     Past Surgical History  Procedure Laterality Date  . Knee arthroscopy    . Cardiac catheterization  07/06/01    spade-like configuration  . Eye surgery    . Dilation and curettage of uterus    . Permanent pacemaker insertion N/A 07/20/2013    Procedure: PERMANENT PACEMAKER INSERTION;  Surgeon: [name], MD;  Location: [facility];  Service: Cardiovascular;  Laterality: N/A;     Current Outpatient Prescriptions  Medication Sig Dispense Refill  . acetaminophen (TYLENOL) 500 MG tablet Take 500 mg by mouth as needed (pain). For fever    .  ALPRAZolam (XANAX) 0.5 MG tablet Take 0.5 mg by mouth at bedtime.     Marland Kitchen amiodarone (PACERONE) 200 MG tablet Take 1 tablet (200 mg total) by mouth daily. 90 tablet 2  . apixaban (ELIQUIS) 5 MG TABS tablet Take 1 tablet (5 mg total) by mouth 2 (two) times daily. 42 tablet 0  . Cholecalciferol (VITAMIN D) 2000 UNITS tablet Take 2,000 Units by mouth daily.    Marland Kitchen dexlansoprazole (DEXILANT) 60 MG capsule Take 60 mg by mouth daily.    Marland Kitchen docusate sodium (COLACE) 100 MG capsule  Take 100 mg by mouth 2 (two) times daily.    Marland Kitchen escitalopram (LEXAPRO) 5 MG tablet Take 5 mg by mouth daily.    Marland Kitchen levothyroxine (SYNTHROID, LEVOTHROID) 75 MCG tablet Take 75 mcg by mouth daily.    Marland Kitchen losartan-hydrochlorothiazide (HYZAAR) 100-12.5 MG per tablet Take 1 tablet by mouth daily. 90 tablet 3  . metoprolol (LOPRESSOR) 50 MG tablet Take 1 tablet (50 mg total) by mouth 2 (two) times daily. 180 tablet 2  . Multiple Vitamin (MULITIVITAMIN WITH MINERALS) TABS Take 1 tablet by mouth daily.    . rosuvastatin (CRESTOR) 5 MG tablet Take 1 tablet (5 mg total) by mouth daily. 90 tablet 3  . traMADol (ULTRAM) 50 MG tablet Take 100 mg by mouth 2 (two) times daily. Take 2 tablets twice daily     No current facility-administered medications for this visit.    Allergies:   Penicillins and Tetanus toxoid    Social History:  The patient  reports that she has never smoked. She has never used smokeless tobacco. She reports that she does not drink alcohol or use illicit drugs.   Family History:  The patient's family history includes Diabetes in her father and mother; Heart attack in her maternal grandfather and maternal grandmother; Hypertension in her father.    ROS:  General:no colds or fevers, no weight changes Skin:no rashes or ulcers HEENT:no blurred vision, no congestion CV:see HPI PUL:see HPI GI:no diarrhea -constipation has improved,  no melena, no indigestion GU:no hematuria, no dysuria MS:no joint pain, no claudication Neuro:no syncope, no lightheadedness Endo:no diabetes, no thyroid disease  Wt Readings from Last 3 Encounters:  06/11/14 177 lb 6.4 oz (80.468 kg)  05/08/14 173 lb 11.2 oz (78.79 kg)  02/27/14 173 lb 3.2 oz (78.563 kg)     PHYSICAL EXAM: VS:  BP 140/72 mmHg  Pulse 80  Ht 5\' 3"  (1.6 m)  Wt 177 lb 6.4 oz (80.468 kg)  BMI 31.43 kg/m2 , BMI Body mass index is 31.43 kg/(m^2). General:Pleasant affect, NAD Skin:Warm and dry, brisk capillary refill HEENT:normocephalic,  sclera clear, mucus membranes moist Neck:supple, no JVD, no bruits  Heart:S1S2 RRR with soft 1/6 systolic murmur, gallup, rub or click Lungs:clear without rales, rhonchi, or wheezes LSL:HTDS, non tender, + BS, do not palpate liver spleen or masses Ext:tr lower ext edema, 2+ pedal pulses, 2+ radial pulses Neuro:alert and oriented, MAE, follows commands, + facial symmetry    EKG:  EKG is not ordered today.    Recent Labs: 07/18/2013: ALT 31; Magnesium 2.2; Pro B Natriuretic peptide (BNP) 5333.0* 05/08/2014: BUN 23; Creatinine 0.98; Hemoglobin 14.4; Platelets 210; Potassium 4.9; Sodium 135; TSH 1.771    Lipid Panel    Component Value Date/Time   CHOL 120 07/19/2013 0539   TRIG 52 07/19/2013 0539   HDL 45 07/19/2013 0539   CHOLHDL 2.7 07/19/2013 0539   VLDL 10 07/19/2013 0539   LDLCALC 65 07/19/2013 0539  Other studies Reviewed: Additional studies/ records that were reviewed today include: labs were normal, last OV.   ASSESSMENT AND PLAN:  1. DOE  Will  Check echo it has been since 2014.  Soft murmur. Follow up with Dr. Corky Downs in 6-8 weeks. May only be due to deconditioning with her back pain and knee pain.    2. Pacer placement, no further palpitations  3. Anxiety, admits to anxiety, reassured about her heart and pacer, discussed lifeline at home or similar device.  4. Fatigue- she is on amiodarone, thyroid and labsdone. TSH 1.77 free T4 1.75, CBC and BMP stable  5. Hx tachy brady now with PPM- stable.  6. anticoagulation with eliquis no GI bleeding or GU. For PAF  7. PAF no recent episodes.   8. Constipation- discussed stool softner and activia yogurt with probiotics- this has helped tremendously.  9. HTN, elevated initially but recheck after resting was normal.     Current medicines are reviewed with the patient today.  The patient Has no concerns regarding medicines.  The following changes have been made:  See above Labs/ tests ordered today  include:see above  Disposition:   FU:  see above  Signed, [name] Serge, NP  06/11/2014 1:49 PM    [facility] [place], [place], [place], [place] Phone: [phone]; Fax: [phone]

## 2014-06-12 NOTE — Telephone Encounter (Signed)
Returned call to patient she stated she is scheduled for a back mri 06/19/14.Stated she was told she will need to hold Eliquis.Message sent to [facility] for advice if ok to hold Eliquis and how long to hold.

## 2014-06-12 NOTE — Telephone Encounter (Signed)
Hold eliquis for 3 days

## 2014-06-12 NOTE — Telephone Encounter (Signed)
Returned call to patient Dr.Kelly advised to hold Eliquis 3 days before mri of back.

## 2014-06-12 NOTE — Telephone Encounter (Signed)
Returning your call from yesterday. °

## 2014-06-14 ENCOUNTER — Emergency Department (HOSPITAL_COMMUNITY): Payer: Medicare Other

## 2014-06-14 ENCOUNTER — Emergency Department (HOSPITAL_COMMUNITY)
Admission: EM | Admit: 2014-06-14 | Discharge: 2014-06-14 | Disposition: A | Discharge status: Home / Self Care | Payer: Medicare Other | Attending: Emergency Medicine | Admitting: Emergency Medicine

## 2014-06-14 ENCOUNTER — Encounter (HOSPITAL_COMMUNITY): Payer: Self-pay | Admitting: Emergency Medicine

## 2014-06-14 DIAGNOSIS — M542 Cervicalgia: Secondary | ICD-10-CM | POA: Diagnosis not present

## 2014-06-14 DIAGNOSIS — E079 Disorder of thyroid, unspecified: Secondary | ICD-10-CM | POA: Diagnosis not present

## 2014-06-14 DIAGNOSIS — R51 Headache: Secondary | ICD-10-CM | POA: Diagnosis not present

## 2014-06-14 DIAGNOSIS — S0990XA Unspecified injury of head, initial encounter: Secondary | ICD-10-CM | POA: Diagnosis not present

## 2014-06-14 DIAGNOSIS — E785 Hyperlipidemia, unspecified: Secondary | ICD-10-CM | POA: Insufficient documentation

## 2014-06-14 DIAGNOSIS — Z880 Allergy status to penicillin: Secondary | ICD-10-CM | POA: Diagnosis not present

## 2014-06-14 DIAGNOSIS — Z79899 Other long term (current) drug therapy: Secondary | ICD-10-CM | POA: Insufficient documentation

## 2014-06-14 DIAGNOSIS — E039 Hypothyroidism, unspecified: Secondary | ICD-10-CM | POA: Diagnosis not present

## 2014-06-14 DIAGNOSIS — Y9389 Activity, other specified: Secondary | ICD-10-CM | POA: Diagnosis not present

## 2014-06-14 DIAGNOSIS — Y9289 Other specified places as the place of occurrence of the external cause: Secondary | ICD-10-CM | POA: Diagnosis not present

## 2014-06-14 DIAGNOSIS — S199XXA Unspecified injury of neck, initial encounter: Secondary | ICD-10-CM | POA: Insufficient documentation

## 2014-06-14 DIAGNOSIS — Z8669 Personal history of other diseases of the nervous system and sense organs: Secondary | ICD-10-CM | POA: Diagnosis not present

## 2014-06-14 DIAGNOSIS — K219 Gastro-esophageal reflux disease without esophagitis: Secondary | ICD-10-CM | POA: Insufficient documentation

## 2014-06-14 DIAGNOSIS — M1990 Unspecified osteoarthritis, unspecified site: Secondary | ICD-10-CM | POA: Insufficient documentation

## 2014-06-14 DIAGNOSIS — W01198A Fall on same level from slipping, tripping and stumbling with subsequent striking against other object, initial encounter: Secondary | ICD-10-CM | POA: Insufficient documentation

## 2014-06-14 DIAGNOSIS — Y998 Other external cause status: Secondary | ICD-10-CM | POA: Diagnosis not present

## 2014-06-14 DIAGNOSIS — W19XXXA Unspecified fall, initial encounter: Secondary | ICD-10-CM

## 2014-06-14 NOTE — ED Notes (Signed)
Patient c/o fall coming in the house. Patient states that the door way is uneven. Patient hit head when she feel. No other injuries. No no open wounds.

## 2014-06-14 NOTE — ED Notes (Signed)
At Dr. Jonathon Jordan request - ice pack placed on patient's head.

## 2014-06-14 NOTE — Discharge Instructions (Signed)
X-rays of head and neck showed no blood clot or broken bones.  Ice pack. Tylenol. Follow-up your Dr.

## 2014-06-14 NOTE — ED Notes (Addendum)
PT stated she slipped and fell by tripping over the doorway transition into another room and hit her right side of her head and posterior head on the floor. Hematoma noted to posterior occipital and right side of skull. PT stated she takes a blood thinner for afib.

## 2014-06-14 NOTE — ED Provider Notes (Signed)
CSN: [id number]     Arrival date & time 06/14/14  1720 History  This chart was scribed for Nat Christen, MD by Delphia Grates, ED Scribe. This patient was seen in room APA07/APA07 and the patient's care was started at 5:38 PM.     Chief Complaint  Patient presents with  . Head Injury    The history is provided by the patient. No language interpreter was used.     HPI Comments: Denise Pugh is a 81 y.o. female who presents to the Emergency Department complaining of a fall that occurred 2 hours ago, resulting in a head injury. Patient states she tripped over the threshold of a doorway causing her to fall and strike the posterior region and right sided of her head. There is associated hematomas at the sites of impact. She also notes some pain to the right neck, stating she had a prior rotator cuff surgery on the right shoulder. Patient is currently on Eliquis. She denies LOC or any other injuries.  PCP: Delphina Cahill, MD   Past Medical History  Diagnosis Date  . Thyroid disease   . Arthritis   . Irregular heart beat   . Arrhythmia     HX of SVT. CARDIONET MONITOR 07/15/12 TO 08/13/12  . Sleep apnea     SLEEP STUDY-[facility]  . Hypertension 08/25/10    ECHO-EF 60-65%  . Hyperlipidemia 08/06/11    lexiscan myoview-normal. Due to severe attenuation the study specificity and sensitivty are deminished.  . Shortness of breath   . Hypothyroidism   . GERD (gastroesophageal reflux disease)    Past Surgical History  Procedure Laterality Date  . Knee arthroscopy    . Cardiac catheterization  07/06/01    spade-like configuration  . Eye surgery    . Dilation and curettage of uterus    . Permanent pacemaker insertion N/A 07/20/2013    Procedure: PERMANENT PACEMAKER INSERTION;  Surgeon: [name], MD;  Location: [facility];  Service: Cardiovascular;  Laterality: N/A;   Family History  Problem Relation Age of Onset  . Diabetes Mother   . Hypertension Father   .  Diabetes Father   . Heart attack Maternal Grandmother   . Heart attack Maternal Grandfather    History  Substance Use Topics  . Smoking status: Never Smoker   . Smokeless tobacco: Never Used  . Alcohol Use: No   OB History    Gravida Para Term Preterm AB TAB SAB Ectopic Multiple Living            1     Review of Systems  A complete 10 system review of systems was obtained and all systems are negative except as noted in the HPI and PMH.    Allergies  Penicillins and Tetanus toxoid  Home Medications   Prior to Admission medications   Medication Sig Start Date End Date Taking? Authorizing Provider  acetaminophen (TYLENOL) 500 MG tablet Take 500 mg by mouth as needed (pain). For fever    Historical Provider, MD  ALPRAZolam Duanne Moron) 0.5 MG tablet Take 0.5 mg by mouth at bedtime.  05/04/13   Historical Provider, MD  amiodarone (PACERONE) 200 MG tablet Take 1 tablet (200 mg total) by mouth daily. 04/02/14   Troy Sine, MD  apixaban (ELIQUIS) 5 MG TABS tablet Take 1 tablet (5 mg total) by mouth 2 (two) times daily. 12/04/13   Troy Sine, MD  Cholecalciferol (VITAMIN D) 2000 UNITS tablet Take 2,000  Units by mouth daily.    Historical Provider, MD  dexlansoprazole (DEXILANT) 60 MG capsule Take 60 mg by mouth daily.    Historical Provider, MD  docusate sodium (COLACE) 100 MG capsule Take 100 mg by mouth 2 (two) times daily.    Historical Provider, MD  escitalopram (LEXAPRO) 5 MG tablet Take 5 mg by mouth daily. 10/11/13   Historical Provider, MD  levothyroxine (SYNTHROID, LEVOTHROID) 75 MCG tablet Take 75 mcg by mouth daily.    Historical Provider, MD  losartan-hydrochlorothiazide (HYZAAR) 100-12.5 MG per tablet Take 1 tablet by mouth daily. 04/05/14   Troy Sine, MD  metoprolol (LOPRESSOR) 50 MG tablet Take 1 tablet (50 mg total) by mouth 2 (two) times daily. 04/16/14   Troy Sine, MD  Multiple Vitamin (MULITIVITAMIN WITH MINERALS) TABS Take 1 tablet by mouth daily.    Historical  Provider, MD  rosuvastatin (CRESTOR) 5 MG tablet Take 1 tablet (5 mg total) by mouth daily. 04/05/14   Troy Sine, MD  traMADol (ULTRAM) 50 MG tablet Take 100 mg by mouth 2 (two) times daily. Take 2 tablets twice daily 08/09/12   Historical Provider, MD   Triage Vitals: BP 181/66 mmHg  Pulse 66  Temp(Src) 99.3 F (37.4 C) (Oral)  Resp 18  Ht 5\' 3"  (1.6 m)  Wt 175 lb (79.379 kg)  BMI 31.01 kg/m2  SpO2 97%  Physical Exam  Constitutional: She is oriented to person, place, and time. She appears well-developed and well-nourished.  HENT:  Head: Normocephalic.  Occiptal hematoma approximately 2.5cm in diameter. Right temporal parietal hematoma approximately 2cm in diameter.  Eyes: Conjunctivae and EOM are normal. Pupils are equal, round, and reactive to light.  Neck: Normal range of motion. Neck supple.  Tender to right posterior lateral neck area.  Cardiovascular: Normal rate and regular rhythm.   Pulmonary/Chest: Effort normal and breath sounds normal.  Abdominal: Soft. Bowel sounds are normal.  Musculoskeletal: Normal range of motion.  Neurological: She is alert and oriented to person, place, and time.  Skin: Skin is warm and dry.  Psychiatric: She has a normal mood and affect. Her behavior is normal.  Nursing note and vitals reviewed.   ED Course  Procedures (including critical care time)  DIAGNOSTIC STUDIES: Oxygen Saturation is 97% on room air, normal by my interpretation.    COORDINATION OF CARE: At 1741 Discussed treatment plan with patient which includes imaging. Patient agrees.   Labs Review Labs Reviewed - No data to display  Imaging Review Ct Head Wo Contrast  06/14/2014   CLINICAL DATA:  [place] over threshold while coming into the door of her house, now with 2 knots involving the posterior and right side of her head. No loss of consciousness.  EXAM: CT HEAD WITHOUT CONTRAST  TECHNIQUE: Contiguous axial images were obtained from the base of the skull through the  vertex without intravenous contrast.  COMPARISON:  None.  FINDINGS: There is a large (approximately 4.8 x 0.9 cm) hematoma about the right posterior parietal calvarium (image 23, series 2) as well as a large (approximately 4.4 x 0.9 cm) hematoma about the right side of the occipital calvarium (image 22, series 2). No radiopaque foreign body. No displaced calvarial fracture.  Mild age-appropriate atrophy with diffuse sulcal prominence. Scattered periventricular hypodensities compatible with microvascular ischemic disease. Bilateral basal ganglial calcifications. Gray-white differentiation is otherwise well maintained without CT evidence of acute large territory infarct. No intraparenchymal or extra-axial mass or hemorrhage. Normal size and configuration of the  ventricles and basilar cisterns. No midline shift. Intracranial atherosclerosis. Limited visualization the paranasal sinuses and mastoid air cells is normal. No air-fluid levels. Post bilateral cataract surgery.  IMPRESSION: 1. Large (approximately 4.8 and 4.4 cm) hematomas about the right posterior parietal calvarium and right side of the occipital calvarium without associated radiopaque foreign body, displaced calvarial fracture or acute intracranial process. 2. Mild, likely age-appropriate, atrophy and microvascular ischemic disease.   Electronically Signed   By: Sandi Mariscal M.D.   On: 06/14/2014 18:12   Ct Cervical Spine Wo Contrast  06/14/2014   CLINICAL DATA:  Head trauma secondary to a fall coming into her house.  EXAM: CT CERVICAL SPINE WITHOUT CONTRAST  TECHNIQUE: Multidetector CT imaging of the cervical spine was performed without intravenous contrast. Multiplanar CT image reconstructions were also generated.  COMPARISON:  None.  FINDINGS: There is no fracture or subluxation or prevertebral soft tissue swelling. There is severe degenerative disc disease at C4-5 and C5-6 and C6-7. There is severe right facet arthritis at C2-3 and C3-4 and C4-5.   IMPRESSION: No acute abnormality of the cervical spine. Multilevel degenerative disc and joint disease.   Electronically Signed   By: Lorriane Shire M.D.   On: 06/14/2014 19:14     EKG Interpretation None      MDM   Final diagnoses:  Fall, initial encounter  Minor head injury, initial encounter  Neck pain    Patient is neurologically intact. CT of head shows large hematomas but no subdural hematoma.   CT cervical spine negative for acute injuries. Discussed with patient and her friend.  I personally performed the services described in this documentation, which was scribed in my presence. The recorded information has been reviewed and is accurate.    Nat Christen, MD 06/14/14 [phone]

## 2014-06-18 ENCOUNTER — Ambulatory Visit (HOSPITAL_COMMUNITY): Payer: Self-pay

## 2014-06-19 ENCOUNTER — Ambulatory Visit (HOSPITAL_COMMUNITY)
Admission: RE | Admit: 2014-06-19 | Discharge: 2014-06-19 | Disposition: A | Discharge status: Home / Self Care | Payer: Medicare Other | Source: Ambulatory Visit | Attending: Internal Medicine | Admitting: Internal Medicine

## 2014-06-19 DIAGNOSIS — M545 Low back pain, unspecified: Secondary | ICD-10-CM

## 2014-06-19 DIAGNOSIS — M5136 Other intervertebral disc degeneration, lumbar region: Secondary | ICD-10-CM | POA: Insufficient documentation

## 2014-06-19 DIAGNOSIS — M47896 Other spondylosis, lumbar region: Secondary | ICD-10-CM | POA: Insufficient documentation

## 2014-06-19 DIAGNOSIS — M5126 Other intervertebral disc displacement, lumbar region: Secondary | ICD-10-CM | POA: Diagnosis not present

## 2014-06-29 ENCOUNTER — Telehealth: Payer: Self-pay | Admitting: Cardiovascular Disease

## 2014-06-29 NOTE — Telephone Encounter (Signed)
LMTCB

## 2014-06-29 NOTE — Telephone Encounter (Signed)
Mrs. Bettcher is calling because she wants to know about Crestor in which she takes 5mg  .  Would like to know if there is something that is less expensive and compatible to the Crestor in which she can take and afford . Please call    Thanks

## 2014-07-02 ENCOUNTER — Other Ambulatory Visit: Payer: Self-pay | Admitting: *Deleted

## 2014-07-02 ENCOUNTER — Ambulatory Visit (HOSPITAL_COMMUNITY)
Admission: RE | Admit: 2014-07-02 | Discharge: 2014-07-02 | Disposition: A | Discharge status: Home / Self Care | Payer: Medicare Other | Source: Ambulatory Visit | Attending: Cardiology | Admitting: Cardiology

## 2014-07-02 DIAGNOSIS — R0609 Other forms of dyspnea: Secondary | ICD-10-CM | POA: Insufficient documentation

## 2014-07-02 DIAGNOSIS — I340 Nonrheumatic mitral (valve) insufficiency: Secondary | ICD-10-CM | POA: Insufficient documentation

## 2014-07-02 DIAGNOSIS — I351 Nonrheumatic aortic (valve) insufficiency: Secondary | ICD-10-CM | POA: Insufficient documentation

## 2014-07-02 DIAGNOSIS — R0600 Dyspnea, unspecified: Secondary | ICD-10-CM | POA: Diagnosis not present

## 2014-07-02 DIAGNOSIS — I071 Rheumatic tricuspid insufficiency: Secondary | ICD-10-CM | POA: Insufficient documentation

## 2014-07-02 MED ORDER — ROSUVASTATIN CALCIUM 5 MG PO TABS: 5.0000 mg | ORAL_TABLET | Freq: Every day | ORAL | Status: DC

## 2014-07-02 NOTE — Telephone Encounter (Signed)
Pt came in for echo today, requested samples of crestor and eliquis. Advised no eliquis samples on hand, gave crestor boxes.

## 2014-07-02 NOTE — Telephone Encounter (Signed)
Pt came in for echo - crestor question addressed here in office.

## 2014-07-02 NOTE — Progress Notes (Signed)
2D Echocardiogram Complete.  07/02/2014   Denise Pugh, [name]

## 2014-08-06 ENCOUNTER — Telehealth: Payer: Self-pay | Admitting: Internal Medicine

## 2014-08-06 DIAGNOSIS — M545 Low back pain: Secondary | ICD-10-CM | POA: Diagnosis not present

## 2014-08-06 DIAGNOSIS — G894 Chronic pain syndrome: Secondary | ICD-10-CM | POA: Diagnosis not present

## 2014-08-06 DIAGNOSIS — M47817 Spondylosis without myelopathy or radiculopathy, lumbosacral region: Secondary | ICD-10-CM | POA: Diagnosis not present

## 2014-08-06 DIAGNOSIS — M4807 Spinal stenosis, lumbosacral region: Secondary | ICD-10-CM | POA: Diagnosis not present

## 2014-08-06 DIAGNOSIS — M5137 Other intervertebral disc degeneration, lumbosacral region: Secondary | ICD-10-CM | POA: Diagnosis not present

## 2014-08-06 NOTE — Telephone Encounter (Signed)
PATIENT ON RECALL FOR TCS °

## 2014-08-07 ENCOUNTER — Ambulatory Visit (INDEPENDENT_AMBULATORY_CARE_PROVIDER_SITE_OTHER): Payer: Medicare Other | Admitting: *Deleted

## 2014-08-07 ENCOUNTER — Telehealth: Payer: Self-pay | Admitting: Cardiology

## 2014-08-07 DIAGNOSIS — I495 Sick sinus syndrome: Secondary | ICD-10-CM | POA: Diagnosis not present

## 2014-08-07 NOTE — Telephone Encounter (Signed)
Letter mailed to pt.  

## 2014-08-07 NOTE — Telephone Encounter (Signed)
LMOVM reminding pt to send remote transmission.   

## 2014-08-07 NOTE — Progress Notes (Signed)
Remote pacemaker transmission.   

## 2014-08-08 ENCOUNTER — Telehealth: Payer: Self-pay | Admitting: *Deleted

## 2014-08-08 ENCOUNTER — Telehealth: Payer: Self-pay | Admitting: Pharmacist Clinician (PhC)/ Clinical Pharmacy Specialist

## 2014-08-08 NOTE — Telephone Encounter (Signed)
Faxed surgical clearance to hold Eliquis for spinal injection.

## 2014-08-08 NOTE — Telephone Encounter (Signed)
Dr, Niel Hummer requesting clearance to stop Eliquis for 3 days prior to spinal injection.  Pt has CHADS2 score of 2.  Per protocol ok to hold for injection.  Information passed to [place] to fax to Dr. Niel Hummer

## 2014-08-13 ENCOUNTER — Other Ambulatory Visit: Payer: Self-pay | Admitting: *Deleted

## 2014-08-13 ENCOUNTER — Telehealth: Payer: Self-pay | Admitting: Cardiovascular Disease

## 2014-08-13 MED ORDER — ROSUVASTATIN CALCIUM 5 MG PO TABS: 5.0000 mg | ORAL_TABLET | Freq: Every day | ORAL | Status: DC

## 2014-08-13 NOTE — Telephone Encounter (Signed)
Crestor refilled.

## 2014-08-13 NOTE — Telephone Encounter (Signed)
Received records from [facility] for appointment with Dr Claiborne Billings on 08/21/14.  Records given to [facility] (medical records) for Dr Evette Georges schedule on 08/21/14. lp

## 2014-08-13 NOTE — Telephone Encounter (Signed)
Please call,question about the generic Crestor. She is completely out of it.

## 2014-08-16 ENCOUNTER — Telehealth: Payer: Self-pay | Admitting: Cardiovascular Disease

## 2014-08-16 NOTE — Telephone Encounter (Signed)
Re: Crestor 5mg   Pt states tier exception would be required by [employer] - cites generic as costing [phone] for a 90-day supply, she can't afford the brand version either ($270).  Rather than initiate tier exception, want to investigate possibly starting a different statin - will route to [place] for recommendation.

## 2014-08-16 NOTE — Telephone Encounter (Signed)
Patient needs to speak with the gentleman that she spoke with yesterday regarding her medications.

## 2014-08-17 NOTE — Telephone Encounter (Signed)
LMOM.  Last LDL was 65, TC 120.  Can switch patient to atorvastatin 10

## 2014-08-20 LAB — CUP PACEART REMOTE DEVICE CHECK
Battery Remaining Longevity: 106 mo
Brady Statistic AP VP Percent: 0.25 %
Brady Statistic AS VP Percent: 0 %
Brady Statistic RV Percent Paced: 0.25 %
Date Time Interrogation Session: 20160517175830
Lead Channel Impedance Value: 399 Ohm
Lead Channel Impedance Value: 494 Ohm
Lead Channel Impedance Value: 684 Ohm
Lead Channel Pacing Threshold Amplitude: 0.5 V
Lead Channel Pacing Threshold Pulse Width: 0.4 ms
Lead Channel Sensing Intrinsic Amplitude: 1.75 mV
Lead Channel Sensing Intrinsic Amplitude: 1.75 mV
Lead Channel Sensing Intrinsic Amplitude: 30.5 mV
Lead Channel Sensing Intrinsic Amplitude: 30.5 mV
Lead Channel Setting Pacing Pulse Width: 0.4 ms
Lead Channel Setting Sensing Sensitivity: 4 mV
MDC IDC MSMT BATTERY VOLTAGE: 3.02 V
MDC IDC MSMT LEADCHNL RA PACING THRESHOLD AMPLITUDE: 1 V
MDC IDC MSMT LEADCHNL RA PACING THRESHOLD PULSEWIDTH: 0.4 ms
MDC IDC MSMT LEADCHNL RV IMPEDANCE VALUE: 551 Ohm
MDC IDC SET LEADCHNL RA PACING AMPLITUDE: 2 V
MDC IDC SET LEADCHNL RV PACING AMPLITUDE: 2.5 V
MDC IDC SET ZONE DETECTION INTERVAL: 350 ms
MDC IDC SET ZONE DETECTION INTERVAL: 400 ms
MDC IDC STAT BRADY AP VS PERCENT: 99.65 %
MDC IDC STAT BRADY AS VS PERCENT: 0.1 %
MDC IDC STAT BRADY RA PERCENT PACED: 99.9 %

## 2014-08-21 ENCOUNTER — Ambulatory Visit (INDEPENDENT_AMBULATORY_CARE_PROVIDER_SITE_OTHER): Payer: Medicare Other | Admitting: Cardiovascular Disease

## 2014-08-21 ENCOUNTER — Encounter: Payer: Self-pay | Admitting: Cardiovascular Disease

## 2014-08-21 VITALS — BP 118/74 | HR 86 | Ht 63.0 in | Wt 179.0 lb

## 2014-08-21 DIAGNOSIS — I10 Essential (primary) hypertension: Secondary | ICD-10-CM

## 2014-08-21 DIAGNOSIS — E039 Hypothyroidism, unspecified: Secondary | ICD-10-CM

## 2014-08-21 DIAGNOSIS — I4891 Unspecified atrial fibrillation: Secondary | ICD-10-CM | POA: Diagnosis not present

## 2014-08-21 DIAGNOSIS — E785 Hyperlipidemia, unspecified: Secondary | ICD-10-CM

## 2014-08-21 DIAGNOSIS — I495 Sick sinus syndrome: Secondary | ICD-10-CM

## 2014-08-21 DIAGNOSIS — Z950 Presence of cardiac pacemaker: Secondary | ICD-10-CM

## 2014-08-21 MED ORDER — ATORVASTATIN CALCIUM 20 MG PO TABS: 20.0000 mg | ORAL_TABLET | Freq: Every day | ORAL | Status: DC

## 2014-08-21 NOTE — Patient Instructions (Addendum)
Your physician wants you to follow-up in: 6 MONTHS OR SOONER IF NEEDED. You will receive a reminder letter in the mail two months in advance. If you don't receive a letter, please call our office to schedule the follow-up appointment.   Your physician has recommended you make the following change in your medication: start new prescription form atorvastatin 20mg .  Take 1/2 tablet daily.  Your physician recommends that you return for lab work in: 3 months after starting atorvastatin.

## 2014-08-21 NOTE — Progress Notes (Signed)
Patient ID: Denise Pugh, female   DOB: June 14, 1928, 81 y.o.   MRN: [id number]     HPI: Denise Pugh is a 81 y.o. female who presents to the office today for a 6 month followup cardiology evaluation.  Denise Pugh has a history of SVTand moderate left ventricular hypertrophy with documented "spade-like ventricle."  Denise Pugh has previously documented diffuse T-wave abnormalities. A stress test in May 2013 showed breast attenuation artifact with a post stress ejection fraction of 70% without evidence for scar or ischemia. In March 2014  Denise Pugh developed leg swelling and I recommended the addition of torsemide 20 mg daily to her medical regimen recommended and discontinued her Voltaren. Subsequent laboratory on 07/15/2012 showed a creatinine of 1.72. Denise Pugh was advised to decrease her torsemide to every other day. Denise Pugh had experienced recurrent episodes of palpitations. A cardiac monitor revealed episodes of SVT up to 185 beats per minute and her beta blocker dose was increased.  A CardioNet monitor on her increased dose of Toprol-XL 25 mg still revealed bursts of atrial tachycardia/A. flutter rate at 189 beats per minute.  On  May 17 Denise Pugh had an episode of tachycardia palpitations and did also have episodes of bradycardia; some of her spells suggested SVT at 150 sig in addition to episode of accelerated idioventricular rhythm when Denise Pugh was sinus bradycardic.  A 2-D echo Doppler study on 09/15/2012 showed an ejection fraction of 23-76%,EGBTD 1 diastolic dysfunction and elevated LV filling pressures.  The aortic valve was as sclerotic without stenosis and there was mild/moderate central regurgitation. 10 mild mitral regurgitation and mild tricuspid regurgitation with mild pulmonary hypertension with a PA estimate pressure at 47 mm.  In April 2015, Denise Pugh was admitted to [facility] hospital with atrial fibrillation with a rapid ventricular response.  Denise Pugh also had a CardioNet monitor which had shown episodes of sinus bradycardia,  as well as PAF, with rates up to 170 beats per minute with also questionable nonsustained VT versus actual fibrillation with aberrancy.  Denise Pugh was started on amiodarone. Her Eliquis was held, and Denise Pugh underwent permanent pacemaker insertion.  Denise Pugh was seen in August 2015 by Dr. Sallyanne Kuster for pacemaker follow-up.  Her underlying rhythm was sinus bradycardia at 38 bpm, and Denise Pugh was pacing the atrium greater than 99% of the time without hardly any episodes of ventricular pacing.  Denise Pugh denies recent chest pain.  Denise Pugh is unaware of any tachyarrhythmia.  One week ago.  Denise Pugh had a telephonic recording of her pacemaker at home but has not yet heard of the results.  2 weeks ago, Denise Pugh ran out of her Crestor.  Denise Pugh had priced the recently available generic version, which was significantly more expensive.  Denise Pugh denies myalgias.  Denise Pugh has been on Hyzaar 100/12.5, Lopressor 50 mg twice a day for hypertension.  Denise Pugh also has been maintaining sinus rhythm with amiodarone 200 mg daily.  Denise Pugh denies bleeding on eliquis.  Denise Pugh has a history of hypothyroidism on Synthroid 75 g.  Denise Pugh presents for evaluation.    Past Medical History  Diagnosis Date  . Thyroid disease   . Arthritis   . Irregular heart beat   . Arrhythmia     HX of SVT. CARDIONET MONITOR 07/15/12 TO 08/13/12  . Sleep apnea     SLEEP STUDY-[facility]  . Hypertension 08/25/10    ECHO-EF 60-65%  . Hyperlipidemia 08/06/11    lexiscan myoview-normal. Due to severe attenuation the study specificity and sensitivty are deminished.  . Shortness  of breath   . Hypothyroidism   . GERD (gastroesophageal reflux disease)     Past Surgical History  Procedure Laterality Date  . Knee arthroscopy    . Cardiac catheterization  07/06/01    spade-like configuration  . Eye surgery    . Dilation and curettage of uterus    . Permanent pacemaker insertion N/A 07/20/2013    Procedure: PERMANENT PACEMAKER INSERTION;  Surgeon: [name], MD;  Location: [facility] CATH  LAB;  Service: Cardiovascular;  Laterality: N/A;    Allergies  Allergen Reactions  . Penicillins Other (See Comments)    tingling  . Tetanus Toxoid Swelling    Current Outpatient Prescriptions  Medication Sig Dispense Refill  . acetaminophen (TYLENOL) 500 MG tablet Take 500 mg by mouth as needed (pain). For fever    . ALPRAZolam (XANAX) 0.5 MG tablet Take 0.5 mg by mouth at bedtime.     Marland Kitchen amiodarone (PACERONE) 200 MG tablet Take 1 tablet (200 mg total) by mouth daily. 90 tablet 2  . apixaban (ELIQUIS) 5 MG TABS tablet Take 1 tablet (5 mg total) by mouth 2 (two) times daily. 42 tablet 0  . atorvastatin (LIPITOR) 20 MG tablet Take 1 tablet (20 mg total) by mouth daily. 90 tablet 3  . Cholecalciferol (VITAMIN D) 2000 UNITS tablet Take 2,000 Units by mouth daily.    Marland Kitchen dexlansoprazole (DEXILANT) 60 MG capsule Take 60 mg by mouth daily.    Marland Kitchen docusate sodium (COLACE) 100 MG capsule Take 100 mg by mouth 2 (two) times daily.    Marland Kitchen escitalopram (LEXAPRO) 5 MG tablet Take 5 mg by mouth daily.    Marland Kitchen levothyroxine (SYNTHROID, LEVOTHROID) 75 MCG tablet Take 75 mcg by mouth daily.    Marland Kitchen losartan-hydrochlorothiazide (HYZAAR) 100-12.5 MG per tablet Take 1 tablet by mouth daily. 90 tablet 3  . metoprolol (LOPRESSOR) 50 MG tablet Take 1 tablet (50 mg total) by mouth 2 (two) times daily. 180 tablet 2  . Multiple Vitamin (MULITIVITAMIN WITH MINERALS) TABS Take 1 tablet by mouth daily.    . rosuvastatin (CRESTOR) 5 MG tablet Take 1 tablet (5 mg total) by mouth daily. 90 tablet 3  . traMADol (ULTRAM) 50 MG tablet Take 100 mg by mouth 2 (two) times daily. Take 2 tablets twice daily     No current facility-administered medications for this visit.   Social history is notable in that Denise Pugh is now. Her husband did suffer a CVA and has been having more difficulty at home. Denise Pugh has one child, 2 step sons, one grandchild and 2 great-grandchildren. There is no tobacco or alcohol use. Denise Pugh does admit to frequent  anxiety.  ROS General: Negative; No fevers, chills, or night sweats; positive for fatigue HEENT: Negative; No changes in vision or hearing, sinus congestion, difficulty swallowing Pulmonary: Negative; No cough, wheezing, shortness of breath, hemoptysis Cardiovascular: Negative; No chest pain, presyncope, syncope, palpitations GI: Negative; No nausea, vomiting, diarrhea, or abdominal pain GU: Negative; No dysuria, hematuria, or difficulty voiding Musculoskeletal: Negative; no myalgias, joint pain, or weakness Hematologic/Oncology: Negative; no easy bruising, bleeding Endocrine: Negative; no heat/cold intolerance; no diabetes Neuro: Negative; no changes in balance, headaches Skin: Negative; No rashes or skin lesions Psychiatric: Negative; No behavioral problems, depression Sleep: Negative; No snoring, daytime sleepiness, hypersomnolence, bruxism, restless legs, hypnogognic hallucinations, no cataplexy Other comprehensive 14 point system review is negative.   PE BP 118/74 mmHg  Pulse 86  Ht 5\' 3"  (1.6 m)  Wt 179 lb (81.194 kg)  BMI  31.72 kg/m2  Repeat blood pressure by me was 120/78. Wt Readings from Last 3 Encounters:  08/21/14 179 lb (81.194 kg)  06/14/14 175 lb (79.379 kg)  06/11/14 177 lb 6.4 oz (80.468 kg)  General: Alert, oriented, no distress.  HEENT: Normocephalic, atraumatic. Pupils round and reactive; sclera anicteric; Mouth/Parynx benign; Mallinpatti scale 2 Neck: No JVD, no carotid bruits Lungs: clear to ausculatation and percussion; no wheezing or rales Chest wall: Nontender to palpation  Heart: RRR, s1 s2 normal 2/6 SEM in the aortic region. No diastolic murmur the; no s3; no rubs thrills or heaves Abdomen: soft, nontender; no hepatosplenomehaly, BS+; abdominal aorta nontender and not dilated by palpation. Mildly obese Back: No CVA tenderness Pulses 2+ Extremities: no clubbinbg cyanosis or edema, Homan's sign negative  Neurologic: grossly nonfocal Psychologic:  Normal affect.  ECG (independently read by me): Atrial paced rhythm at 86 bpm with prolonged PR segment.  T-wave inversion  December 2015 ECG (independently read by me): Atrially paced rhythm with ventricular sensing with incomplete right bundle branch block.  Previously noted T-wave abnormalities  07/24/2013 ECG: Atrial lead paced rhythm.  Mild RV conduction delay.  Diffuse T-wave inversion has been present previously  Prior ECG today  (independently read by me): Bradycardia at 48 beats per minute. RV conduction delay. Previously noted diffuse inferior and anterolateral T wave inversion  Prior ECG of 01/20/2013: sinus bradycardia at 55 beats per minute. Previously noted diffuse T-wave inversion in leads 23F, V2 through V6. Mild RV conduction delay.  LABS: Basic Metabolic Panel: No results for input(s): NA, K, CL, CO2, GLUCOSE, BUN, CREATININE, CALCIUM, MG, PHOS in the last 72 hours. Liver Function Tests: No results for input(s): AST, ALT, ALKPHOS, BILITOT, PROT, ALBUMIN in the last 72 hours. No results for input(s): LIPASE, AMYLASE in the last 72 hours. CBC: No results for input(s): WBC, NEUTROABS, HGB, HCT, MCV, PLT in the last 72 hours. Cardiac Enzymes: No results for input(s): CKTOTAL, CKMB, CKMBINDEX, TROPONINI in the last 72 hours. BNP: Invalid input(s): POCBNP D-Dimer: No results for input(s): DDIMER in the last 72 hours. Hemoglobin A1C: No results for input(s): HGBA1C in the last 72 hours. Fasting Lipid Panel: No results for input(s): CHOL, HDL, LDLCALC, TRIG, CHOLHDL, LDLDIRECT in the last 72 hours. Thyroid Function Tests: No results for input(s): TSH, T4TOTAL, T3FREE, THYROIDAB in the last 72 hours.  Invalid input(s): FREET3 Anemia Panel: No results for input(s): VITAMINB12, FOLATE, FERRITIN, TIBC, IRON, RETICCTPCT in the last 72 hours.  RADIOLOGY: No results found.    ASSESSMENT AND PLAN:   Denise Pugh is an 81 year old Caucasian female who has a history of SVT and  documented moderate left ventricle hypertrophy the with a "spade-like ventricle. " Denise Pugh has diffuse T-wave abnormalities which have been present for years. In 2003 cardiac catheterization did not demonstrate coronary obstructive disease but Denise Pugh had very minimal midsystolic bridging not felt to be significant in her LAD territory.  Denise Pugh has a history of SVT, PAF, and had developed bradycardia arrhythmia resulting in her permanent pacemaker implantation last year.  He is unaware of any recurrent episodes of SVT, PAF, or other arrhythmia.  Denise Pugh is tolerating eliquis without bleeding side effects.  Denise Pugh continues to be on low-dose amiodarone at 200 mg daily.  Denise Pugh is now 100% atrially paced.  Denise Pugh saw Dr. Sallyanne Kuster in August 2015 for her initial post pacemaker evaluation.  I have recommended that Denise Pugh see him in August 2016 for one-year follow-up evaluation.  Her blood pressure is stable on current  therapy.  Denise Pugh ran out of her rosuvastatin and feels that Denise Pugh may not be able to afford the current therapy since we no longer can provide her with samples.  I will see right a prescription for atorvastatin 20 mg and Denise Pugh will initially take this one half pill per day.  Follow-up blood work will be done in 2 months.  If necessary, medication adjustment will be done at that time.  Denise Pugh will be contacted concerning her most recent telephonic recording concerning her pacemaker interrogation.  Denise Pugh will have a one-year follow-up pacemaker evaluation in August.  I will see her in 6 months for cardiology evaluation.  Time spent: 25 minutes  Denise Pugh M.D., [facility] 08/21/2014 7:13 PM

## 2014-08-22 NOTE — Telephone Encounter (Signed)
Pt saw [name] on 5/31, switched to atorvastatin 10 mg

## 2014-08-30 ENCOUNTER — Telehealth: Payer: Self-pay | Admitting: Cardiovascular Disease

## 2014-08-30 NOTE — Telephone Encounter (Signed)
Patient notified to have fasting lab work 3 months after starting cholesterol medication - late August/early Sept. She voiced understanding

## 2014-08-30 NOTE — Telephone Encounter (Signed)
LMTCB

## 2014-08-30 NOTE — Telephone Encounter (Signed)
Pt called in wanting to know when Dr.Kelly was wanting her to have the blood work done since she is just beginning on the Atorvastatin . Please call  Thanks

## 2014-08-31 ENCOUNTER — Encounter: Payer: Self-pay | Admitting: Cardiology

## 2014-09-04 ENCOUNTER — Encounter: Payer: Self-pay | Admitting: Cardiovascular Disease

## 2014-09-06 DIAGNOSIS — M545 Low back pain: Secondary | ICD-10-CM | POA: Diagnosis not present

## 2014-09-06 DIAGNOSIS — G894 Chronic pain syndrome: Secondary | ICD-10-CM | POA: Diagnosis not present

## 2014-09-06 DIAGNOSIS — M5137 Other intervertebral disc degeneration, lumbosacral region: Secondary | ICD-10-CM | POA: Diagnosis not present

## 2014-09-06 DIAGNOSIS — M4807 Spinal stenosis, lumbosacral region: Secondary | ICD-10-CM | POA: Diagnosis not present

## 2014-09-06 DIAGNOSIS — M5416 Radiculopathy, lumbar region: Secondary | ICD-10-CM | POA: Diagnosis not present

## 2014-10-10 DIAGNOSIS — M5137 Other intervertebral disc degeneration, lumbosacral region: Secondary | ICD-10-CM | POA: Diagnosis not present

## 2014-10-10 DIAGNOSIS — M25561 Pain in right knee: Secondary | ICD-10-CM | POA: Diagnosis not present

## 2014-10-10 DIAGNOSIS — G894 Chronic pain syndrome: Secondary | ICD-10-CM | POA: Diagnosis not present

## 2014-10-10 DIAGNOSIS — M170 Bilateral primary osteoarthritis of knee: Secondary | ICD-10-CM | POA: Diagnosis not present

## 2014-10-10 DIAGNOSIS — M545 Low back pain: Secondary | ICD-10-CM | POA: Diagnosis not present

## 2014-10-10 DIAGNOSIS — Z79891 Long term (current) use of opiate analgesic: Secondary | ICD-10-CM | POA: Diagnosis not present

## 2014-10-10 DIAGNOSIS — M4807 Spinal stenosis, lumbosacral region: Secondary | ICD-10-CM | POA: Diagnosis not present

## 2014-10-15 ENCOUNTER — Encounter: Payer: Self-pay | Admitting: Cardiovascular Disease

## 2014-10-29 ENCOUNTER — Telehealth: Payer: Self-pay | Admitting: Cardiovascular Disease

## 2014-10-29 NOTE — Telephone Encounter (Signed)
Denise Pugh is calling because she wants to know what do [name] if she has a spinal cord Stimulation. She has a pacemaker as well . .. Please call   Thanks

## 2014-10-29 NOTE — Telephone Encounter (Signed)
Patient is seeing Dr. Niel Hummer for back injections which has not helped. This MD recommended spinal cord stimulator. She was provided with info on this stimulator by this MD. She wants to know if [name] thinks this would be OK to have done with her heart condition and her pacemaker.   Informed patient this message will be routed to [name] to review and advise

## 2014-11-02 ENCOUNTER — Other Ambulatory Visit: Payer: Self-pay | Admitting: Cardiovascular Disease

## 2014-11-02 NOTE — Telephone Encounter (Signed)
REFILL 

## 2014-11-08 NOTE — Telephone Encounter (Signed)
Returned call to patient Dr.Croitoru advised ok to have spinal stimulator.Advised make sure no crosstalk between the devices.Advised to let [employer] rep know, they can deal with that problem if needed.

## 2014-11-08 NOTE — Telephone Encounter (Signed)
Returned call to patient she stated she wanted to ask Dr.Kelly if ok to have a spinal cord stimulator.Stated she has a pacemaker but wanted to ask Dr.Kelly if ok.Advised I will speak to [facility] this afternoon and call back.

## 2014-11-08 NOTE — Telephone Encounter (Signed)
Pt says she have been waiting to hear something from Dr Evette Georges office since 10-29-14. Please call her today.

## 2014-11-08 NOTE — Telephone Encounter (Signed)
Returned call to patient Dr.Kelly advised ask Dr.Croitoru if ok to have spinal cord stimulator.Message sent to Dr.Croitoru for advice.

## 2014-11-08 NOTE — Telephone Encounter (Signed)
Yes, she can have a spinal cord stimulator, but precautions have to be taken at the time of implanation to make sure there is no "crosstalk" between the devices. The same company ([employer]) makes the most commonly used spinal stimulator, so they have representatives that know how to deal with the problem.  She is not pacemaker dependent, but paces the atrium nearly all the time (as of last check May 2016)

## 2014-11-09 ENCOUNTER — Telehealth: Payer: Self-pay | Admitting: Cardiovascular Disease

## 2014-11-09 NOTE — Telephone Encounter (Signed)
Medication refilled 11/02/14. #180-1 refill was sent to [facility]. See refill encounter from 11/02/14.

## 2014-11-09 NOTE — Telephone Encounter (Signed)
°  1. Which medications need to be refilled? Metoprolol-New prescription 2. Which pharmacy is medication to be sent to?[facility]  3. Do they need a 30 day or 90 day supply? #180 and refill 4. Would they like a call back once the medication has been sent to the pharmacy?bo

## 2014-11-12 NOTE — Telephone Encounter (Signed)
ok 

## 2014-11-19 DIAGNOSIS — F411 Generalized anxiety disorder: Secondary | ICD-10-CM | POA: Diagnosis not present

## 2014-11-19 DIAGNOSIS — M159 Polyosteoarthritis, unspecified: Secondary | ICD-10-CM | POA: Diagnosis not present

## 2014-11-19 DIAGNOSIS — R350 Frequency of micturition: Secondary | ICD-10-CM | POA: Diagnosis not present

## 2014-11-19 DIAGNOSIS — M545 Low back pain: Secondary | ICD-10-CM | POA: Diagnosis not present

## 2014-11-22 DIAGNOSIS — I10 Essential (primary) hypertension: Secondary | ICD-10-CM | POA: Diagnosis not present

## 2014-11-22 DIAGNOSIS — E785 Hyperlipidemia, unspecified: Secondary | ICD-10-CM | POA: Diagnosis not present

## 2014-11-29 ENCOUNTER — Encounter: Payer: Self-pay | Admitting: Cardiovascular Disease

## 2015-01-08 ENCOUNTER — Encounter: Payer: Self-pay | Admitting: Cardiovascular Disease

## 2015-01-08 ENCOUNTER — Ambulatory Visit (INDEPENDENT_AMBULATORY_CARE_PROVIDER_SITE_OTHER): Payer: Medicare Other | Admitting: Cardiovascular Disease

## 2015-01-08 VITALS — BP 140/80 | HR 90 | Resp 16 | Ht 63.0 in | Wt 184.0 lb

## 2015-01-08 DIAGNOSIS — I495 Sick sinus syndrome: Secondary | ICD-10-CM

## 2015-01-08 DIAGNOSIS — I4891 Unspecified atrial fibrillation: Secondary | ICD-10-CM | POA: Diagnosis not present

## 2015-01-08 DIAGNOSIS — Z950 Presence of cardiac pacemaker: Secondary | ICD-10-CM

## 2015-01-08 DIAGNOSIS — R001 Bradycardia, unspecified: Secondary | ICD-10-CM

## 2015-01-08 DIAGNOSIS — I10 Essential (primary) hypertension: Secondary | ICD-10-CM | POA: Diagnosis not present

## 2015-01-08 DIAGNOSIS — I480 Paroxysmal atrial fibrillation: Secondary | ICD-10-CM

## 2015-01-08 DIAGNOSIS — T50905A Adverse effect of unspecified drugs, medicaments and biological substances, initial encounter: Secondary | ICD-10-CM

## 2015-01-08 NOTE — Progress Notes (Signed)
Patient ID: Denise Pugh, female   DOB: September 05, 1928, 81 y.o.   MRN: [id number]     Cardiology Office Note   Date:  01/08/2015   ID:  Denise Pugh, DOB 07-Sep-1928, MRN [id number]  PCP:  Wende Neighbors, MD  Cardiologist:  [name], M.D. [name], MD   Chief Complaint  Patient presents with  . Annual Exam      History of Present Illness: Denise Pugh is a 81 y.o. female who presents for pacemaker follow-up (tachycardia-bradycardia syndrome with symptomatic sinus bradycardia, very long AV conduction times and history of paroxysmal atrial fibrillation. She had a dual-chamber permanent pacemaker implanted in 2015 and is on chronic treatment with amiodarone, beta blockers and anticoagulation with Eliquis. Since her last appointment she has not had any cardiac events and has not had focal neurological episodes or bleeding complications. She does not have signs or symptoms of hyper/hypothyroidism, new lung problems, congestive heart failure. She had recent normal liver function tests and thyroid studies.  She has an abnormal ECG related to apical variant hypertrophic cardiomyopathy.  Interrogation of her pacemaker shows normal device function with an estimated 8.5 years of generator longevity. There is virtually 100% atrial pacing and only 0.2% ventricular pacing. Activity level is constant at about 1.2 hours per day and her heart rate histogram is favorable. Since her last device check there have been no episodes of atrial fibrillation or other atrial mode switch episodes.  Her pacemaker is programmed with MVP on and she has a very long AV conduction time in excess of 400 ms.  She has no cardiac complaints but has a lot of limitations because of back problems. She has had repeated back injections and is active scheduled to have another procedure performed next week. A possible need for a spinal stimulator has been discussed.    Past Medical History  Diagnosis Date  . Thyroid disease   .  Arthritis   . Irregular heart beat   . Arrhythmia     HX of SVT. CARDIONET MONITOR 07/15/12 TO 08/13/12  . Sleep apnea     SLEEP STUDY-[facility]  . Hypertension 08/25/10    ECHO-EF 60-65%  . Hyperlipidemia 08/06/11    lexiscan myoview-normal. Due to severe attenuation the study specificity and sensitivty are deminished.  . Shortness of breath   . Hypothyroidism   . GERD (gastroesophageal reflux disease)     Past Surgical History  Procedure Laterality Date  . Knee arthroscopy    . Cardiac catheterization  07/06/01    spade-like configuration  . Eye surgery    . Dilation and curettage of uterus    . Permanent pacemaker insertion N/A 07/20/2013    Procedure: PERMANENT PACEMAKER INSERTION;  Surgeon: [name], MD;  Location: [place] CATH LAB;  Service: Cardiovascular;  Laterality: N/A;     Current Outpatient Prescriptions  Medication Sig Dispense Refill  . acetaminophen (TYLENOL) 500 MG tablet Take 500 mg by mouth as needed (pain). For fever    . ALPRAZolam (XANAX) 0.5 MG tablet Take 0.5 mg by mouth at bedtime.     Marland Kitchen amiodarone (PACERONE) 200 MG tablet Take 1 tablet (200 mg total) by mouth daily. 90 tablet 2  . apixaban (ELIQUIS) 5 MG TABS tablet Take 1 tablet (5 mg total) by mouth 2 (two) times daily. 42 tablet 0  . atorvastatin (LIPITOR) 20 MG tablet Take 1 tablet (20 mg total) by mouth daily. 90 tablet 3  . Cholecalciferol (VITAMIN D) 2000  UNITS tablet Take 2,000 Units by mouth daily.    Marland Kitchen dexlansoprazole (DEXILANT) 60 MG capsule Take 60 mg by mouth daily.    Marland Kitchen docusate sodium (COLACE) 100 MG capsule Take 100 mg by mouth 2 (two) times daily.    Marland Kitchen escitalopram (LEXAPRO) 5 MG tablet Take 5 mg by mouth daily.    Marland Kitchen levothyroxine (SYNTHROID, LEVOTHROID) 75 MCG tablet Take 75 mcg by mouth daily.    Marland Kitchen losartan-hydrochlorothiazide (HYZAAR) 100-12.5 MG per tablet Take 1 tablet by mouth daily. 90 tablet 3  . metoprolol (LOPRESSOR) 50 MG tablet Take 1 tablet by mouth two   times daily 180 tablet 1  . Multiple Vitamin (MULITIVITAMIN WITH MINERALS) TABS Take 1 tablet by mouth daily.    . traMADol (ULTRAM) 50 MG tablet Take 100 mg by mouth 2 (two) times daily. Take 2 tablets twice daily     No current facility-administered medications for this visit.    Allergies:   Penicillins and Tetanus toxoid    Social History:  The patient  reports that she has never smoked. She has never used smokeless tobacco. She reports that she does not drink alcohol or use illicit drugs.   Family History:  The patient's family history includes Diabetes in her father and mother; Heart attack in her maternal grandfather and maternal grandmother; Hypertension in her father.    ROS:  Please see the history of present illness.    Otherwise, review of systems positive for none.   All other systems are reviewed and negative.    PHYSICAL EXAM: VS:  BP 140/80 mmHg  Pulse 90  Resp 16  Ht 5\' 3"  (1.6 m)  Wt 184 lb (83.462 kg)  BMI 32.60 kg/m2 , BMI Body mass index is 32.6 kg/(m^2).  General: Alert, oriented x3, no distress Head: no evidence of trauma, PERRL, EOMI, no exophtalmos or lid lag, no myxedema, no xanthelasma; normal ears, nose and oropharynx Neck: normal jugular venous pulsations and no hepatojugular reflux; brisk carotid pulses without delay and no carotid bruits Chest: clear to auscultation, no signs of consolidation by percussion or palpation, normal fremitus, symmetrical and full respiratory excursions Cardiovascular: normal position and quality of the apical impulse, regular rhythm, normal first and split second heart sounds, no murmurs, rubs or gallops Abdomen: no tenderness or distention, no masses by palpation, no abnormal pulsatility or arterial bruits, normal bowel sounds, no hepatosplenomegaly Extremities: no clubbing, cyanosis or edema; 2+ radial, ulnar and brachial pulses bilaterally; 2+ right femoral, posterior tibial and dorsalis pedis pulses; 2+ left femoral,  posterior tibial and dorsalis pedis pulses; no subclavian or femoral bruits Neurological: grossly nonfocal Psych: euthymic mood, full affect   EKG:  EKG is ordered today. The ekg ordered today demonstrates atrial paced, ventricular sensed rhythm with a very long AV delay of over 400 ms, incomplete right bundle branch block (QRS 108 ms), QTC 467 ms, T-wave inversion V2-V6 unchanged from previous tracings   Recent Labs: 05/08/2014: BUN 23; Creat 0.98; Hemoglobin 14.4; Platelets 210; Potassium 4.9; Sodium 135; TSH 1.771    Lipid Panel    Component Value Date/Time   CHOL 120 07/19/2013 0539   TRIG 52 07/19/2013 0539   HDL 45 07/19/2013 0539   CHOLHDL 2.7 07/19/2013 0539   VLDL 10 07/19/2013 0539   LDLCALC 65 07/19/2013 0539      Wt Readings from Last 3 Encounters:  01/08/15 184 lb (83.462 kg)  08/21/14 179 lb (81.194 kg)  06/14/14 175 lb (79.379 kg)  ASSESSMENT AND PLAN:  1. Normal function of her dual-chamber permanent pacemaker. She has evidence of both sinus node dysfunction and AV conduction abnormalities. The very long AV delay is a consequence of intrinsic AV conduction disease, but I think it remains preferable to prevent ventricular pacing. Will continue with her modalities every 3 months and office visits yearly. She will follow-up with [name] for other cardiac problems. A lumbar spinal stimulator can probably be entertained, but certain precautions will need to be taken at the time of device implantation to avoid interference with her pacemaker.  2. History of paroxysmal atrial fibrillation and atrial tachycardia, without any episodes recorded in the. Since her last device check. CHADSVasc at least 3 (age 36, diastolic HF).  3. Chronic anticoagulation and amiodarone therapy. Needs liver function tests and thyroid function tests at least every 6 months. No bleeding complications and no history of cardioembolic events to date.    Current medicines are reviewed at  length with the patient today.  The patient does not have concerns regarding medicines.  The following changes have been made:  no change  Labs/ tests ordered today include:  Orders Placed This Encounter  Procedures  . EKG 12-Lead    Patient Instructions  Your physician wants you to follow-up in: 12 Months. You will receive a reminder letter in the mail two months in advance. If you don't receive a letter, please call our office to schedule the follow-up appointment.  Remote monitoring is used to monitor your Pacemaker of ICD from home. This monitoring reduces the number of office visits required to check your device to one time per year. It allows [place] to keep an eye on the functioning of your device to ensure it is working properly. You are scheduled for a device check from home on 04/09/2015. You may send your transmission at any time that day. If you have a wireless device, the transmission will be sent automatically. After your physician reviews your transmission, you will receive a postcard with your next transmission date.         Mikael Spray, MD  01/08/2015 5:27 PM    [name], MD, [facility] [phone] office [phone] pager

## 2015-01-08 NOTE — Patient Instructions (Signed)
Your physician wants you to follow-up in: 12 Months. You will receive a reminder letter in the mail two months in advance. If you don't receive a letter, please call our office to schedule the follow-up appointment.  Remote monitoring is used to monitor your Pacemaker of ICD from home. This monitoring reduces the number of office visits required to check your device to one time per year. It allows [place] to keep an eye on the functioning of your device to ensure it is working properly. You are scheduled for a device check from home on 04/09/2015. You may send your transmission at any time that day. If you have a wireless device, the transmission will be sent automatically. After your physician reviews your transmission, you will receive a postcard with your next transmission date.

## 2015-01-09 DIAGNOSIS — M5416 Radiculopathy, lumbar region: Secondary | ICD-10-CM | POA: Diagnosis not present

## 2015-01-09 DIAGNOSIS — M4807 Spinal stenosis, lumbosacral region: Secondary | ICD-10-CM | POA: Diagnosis not present

## 2015-01-09 DIAGNOSIS — G894 Chronic pain syndrome: Secondary | ICD-10-CM | POA: Diagnosis not present

## 2015-01-09 DIAGNOSIS — M5137 Other intervertebral disc degeneration, lumbosacral region: Secondary | ICD-10-CM | POA: Diagnosis not present

## 2015-01-09 DIAGNOSIS — M545 Low back pain: Secondary | ICD-10-CM | POA: Diagnosis not present

## 2015-01-10 LAB — CUP PACEART INCLINIC DEVICE CHECK
Battery Remaining Longevity: 105 mo
Battery Voltage: 3.02 V
Brady Statistic AP VP Percent: 0.21 %
Brady Statistic AP VS Percent: 99.68 %
Brady Statistic RA Percent Paced: 99.89 %
Brady Statistic RV Percent Paced: 0.22 %
Date Time Interrogation Session: 20161018161348
Implantable Lead Implant Date: 20150430
Implantable Lead Location: 753859
Implantable Lead Location: 753860
Implantable Lead Model: 5076
Implantable Lead Model: 5076
Lead Channel Impedance Value: 380 Ohm
Lead Channel Impedance Value: 551 Ohm
Lead Channel Impedance Value: 665 Ohm
Lead Channel Pacing Threshold Pulse Width: 0.4 ms
Lead Channel Pacing Threshold Pulse Width: 0.4 ms
Lead Channel Sensing Intrinsic Amplitude: 1.25 mV
Lead Channel Sensing Intrinsic Amplitude: 2.125 mV
Lead Channel Sensing Intrinsic Amplitude: 30.25 mV
Lead Channel Setting Pacing Amplitude: 2.25 V
Lead Channel Setting Pacing Amplitude: 2.5 V
Lead Channel Setting Sensing Sensitivity: 4 mV
MDC IDC LEAD IMPLANT DT: 20150430
MDC IDC MSMT LEADCHNL RA IMPEDANCE VALUE: 475 Ohm
MDC IDC MSMT LEADCHNL RA PACING THRESHOLD AMPLITUDE: 1.125 V
MDC IDC MSMT LEADCHNL RV PACING THRESHOLD AMPLITUDE: 0.625 V
MDC IDC MSMT LEADCHNL RV SENSING INTR AMPL: 31.625 mV
MDC IDC SET LEADCHNL RV PACING PULSEWIDTH: 0.4 ms
MDC IDC STAT BRADY AS VP PERCENT: 0 %
MDC IDC STAT BRADY AS VS PERCENT: 0.11 %

## 2015-01-19 ENCOUNTER — Other Ambulatory Visit: Payer: Self-pay | Admitting: Cardiovascular Disease

## 2015-02-06 DIAGNOSIS — M25562 Pain in left knee: Secondary | ICD-10-CM | POA: Diagnosis not present

## 2015-02-06 DIAGNOSIS — G894 Chronic pain syndrome: Secondary | ICD-10-CM | POA: Diagnosis not present

## 2015-02-06 DIAGNOSIS — M25511 Pain in right shoulder: Secondary | ICD-10-CM | POA: Diagnosis not present

## 2015-02-06 DIAGNOSIS — M25561 Pain in right knee: Secondary | ICD-10-CM | POA: Diagnosis not present

## 2015-02-06 DIAGNOSIS — M170 Bilateral primary osteoarthritis of knee: Secondary | ICD-10-CM | POA: Diagnosis not present

## 2015-02-07 ENCOUNTER — Other Ambulatory Visit (HOSPITAL_COMMUNITY): Payer: Self-pay | Admitting: Physical Medicine and Rehabilitation

## 2015-02-07 ENCOUNTER — Ambulatory Visit (HOSPITAL_COMMUNITY)
Admission: RE | Admit: 2015-02-07 | Discharge: 2015-02-07 | Disposition: A | Discharge status: Home / Self Care | Payer: Medicare Other | Source: Ambulatory Visit | Attending: Physical Medicine and Rehabilitation | Admitting: Physical Medicine and Rehabilitation

## 2015-02-07 DIAGNOSIS — M25561 Pain in right knee: Secondary | ICD-10-CM | POA: Diagnosis not present

## 2015-02-07 DIAGNOSIS — M19011 Primary osteoarthritis, right shoulder: Secondary | ICD-10-CM | POA: Diagnosis not present

## 2015-02-07 DIAGNOSIS — M179 Osteoarthritis of knee, unspecified: Secondary | ICD-10-CM | POA: Diagnosis not present

## 2015-02-07 DIAGNOSIS — M75101 Unspecified rotator cuff tear or rupture of right shoulder, not specified as traumatic: Secondary | ICD-10-CM | POA: Diagnosis not present

## 2015-02-07 DIAGNOSIS — M170 Bilateral primary osteoarthritis of knee: Secondary | ICD-10-CM

## 2015-02-11 ENCOUNTER — Ambulatory Visit (INDEPENDENT_AMBULATORY_CARE_PROVIDER_SITE_OTHER): Payer: Medicare Other | Admitting: Cardiovascular Disease

## 2015-02-11 ENCOUNTER — Encounter: Payer: Self-pay | Admitting: Cardiovascular Disease

## 2015-02-11 VITALS — BP 144/76 | HR 81 | Ht 63.0 in | Wt 181.6 lb

## 2015-02-11 DIAGNOSIS — I480 Paroxysmal atrial fibrillation: Secondary | ICD-10-CM

## 2015-02-11 DIAGNOSIS — I495 Sick sinus syndrome: Secondary | ICD-10-CM | POA: Diagnosis not present

## 2015-02-11 DIAGNOSIS — E039 Hypothyroidism, unspecified: Secondary | ICD-10-CM

## 2015-02-11 DIAGNOSIS — I10 Essential (primary) hypertension: Secondary | ICD-10-CM | POA: Diagnosis not present

## 2015-02-11 DIAGNOSIS — E785 Hyperlipidemia, unspecified: Secondary | ICD-10-CM

## 2015-02-11 DIAGNOSIS — Z7901 Long term (current) use of anticoagulants: Secondary | ICD-10-CM

## 2015-02-11 NOTE — Patient Instructions (Signed)
Your physician wants you to follow-up in: May 2017. You will receive a reminder letter in the mail two months in advance. If you don't receive a letter, please call our office to schedule the follow-up appointment.  If you need a refill on your cardiac medications before your next appointment, please call your pharmacy.

## 2015-02-12 ENCOUNTER — Encounter: Payer: Self-pay | Admitting: Cardiovascular Disease

## 2015-02-12 DIAGNOSIS — Z7901 Long term (current) use of anticoagulants: Secondary | ICD-10-CM | POA: Insufficient documentation

## 2015-02-12 NOTE — Progress Notes (Signed)
Patient ID: Denise Pugh, female   DOB: 04-11-1928, 81 y.o.   MRN: [id number]     HPI: Denise Pugh is a 81 y.o. female who presents to the office today for a 6 month followup cardiology evaluation.  [name] has a history of SVTand moderate left ventricular hypertrophy with documented "spade-like ventricle."  She has previously documented diffuse T-wave abnormalities. A stress test in May 2013 showed breast attenuation artifact with a post stress ejection fraction of 70% without evidence for scar or ischemia. She had experienced recurrent episodes of palpitations. A cardiac monitor revealed episodes of SVT up to 185 beats per minute and her beta blocker dose was increased.  A CardioNet monitor on her increased dose of Toprol-XL 25 mg still revealed bursts of atrial tachycardia/A. flutter rate at 189 beats per minute.  On  May 17 she had an episode of tachycardia palpitations and did also have episodes of bradycardia; some of her spells suggested SVT at 150  in addition to episode of accelerated idioventricular rhythm when she was sinus bradycardic.  A 2-D echo Doppler study on 09/15/2012 showed an ejection fraction of 0000000 1 diastolic dysfunction and elevated LV filling pressures.  The aortic valve was as sclerotic without stenosis and there was mild/moderate central regurgitation. 10 mild mitral regurgitation and mild tricuspid regurgitation with mild pulmonary hypertension with a PA estimate pressure at 47 mm.  In April 2015, she was admitted to [facility] hospital with atrial fibrillation with a rapid ventricular response.  She also had a CardioNet monitor which had shown episodes of sinus bradycardia, as well as PAF, with rates up to 170 beats per minute with also questionable nonsustained VT versus actual fibrillation with aberrancy.  She was started on amiodarone. Her Eliquis was held, and she underwent permanent pacemaker insertion.  She was seen in August 2015 by Dr. Sallyanne Kuster for  pacemaker follow-up.  Her underlying rhythm was sinus bradycardia at 38 bpm, and she was pacing the atrium greater than 99% of the time without hardly any episodes of ventricular pacing.  She recently saw Dr. Sallyanne Kuster for one-year evaluation in October 2016.  She has both sinus node dysfunction and AV conduction abnormalities.  Her pacemaker is programmed with MVP on and she has a very long AV conduction time so as to prevent ventricular pacing.  Since I last saw her, she  denies recent chest pain.  She is unaware of any tachyarrhythmia.  She has been maintained on eloquence 5 mg twice a day for anticoagulation and continues to be on amiodarone 200 mg daily.  She has been on metoprolol 50 g twice a day, losartan HCT 100/12.5 mg for hypertension.  She has a history of hypothyroidism and is  on Synthroid 75 g.  She continues to be on atorvastatin for hyperlipidemia.  She sees Dr. Edwyna Ready call for her primary care.  She is also followed by Dr. Max Fickle for pain management.  She presents for evaluation.   Past Medical History  Diagnosis Date  . Thyroid disease   . Arthritis   . Irregular heart beat   . Arrhythmia     HX of SVT. CARDIONET MONITOR 07/15/12 TO 08/13/12  . Sleep apnea     SLEEP STUDY-[facility]  . Hypertension 08/25/10    ECHO-EF 60-65%  . Hyperlipidemia 08/06/11    lexiscan myoview-normal. Due to severe attenuation the study specificity and sensitivty are deminished.  . Shortness of breath   . Hypothyroidism   .  GERD (gastroesophageal reflux disease)     Past Surgical History  Procedure Laterality Date  . Knee arthroscopy    . Cardiac catheterization  07/06/01    spade-like configuration  . Eye surgery    . Dilation and curettage of uterus    . Permanent pacemaker insertion N/A 07/20/2013    Procedure: PERMANENT PACEMAKER INSERTION;  Surgeon: [name], MD;  Location: [facility] CATH [facility];  Service: Cardiovascular;  Laterality: N/A;    Allergies  Allergen Reactions   . Penicillins Other (See Comments)    tingling  . Tetanus Toxoid Swelling    Current Outpatient Prescriptions  Medication Sig Dispense Refill  . acetaminophen (TYLENOL) 500 MG tablet Take 500 mg by mouth as needed (pain). For fever    . ALPRAZolam (XANAX) 0.5 MG tablet Take 0.5 mg by mouth at bedtime.     Marland Kitchen amiodarone (PACERONE) 200 MG tablet Take 1 tablet by mouth  daily 90 tablet 1  . apixaban (ELIQUIS) 5 MG TABS tablet Take 1 tablet (5 mg total) by mouth 2 (two) times daily. 42 tablet 0  . atorvastatin (LIPITOR) 20 MG tablet Take 1 tablet (20 mg total) by mouth daily. 90 tablet 3  . Cholecalciferol (VITAMIN D) 2000 UNITS tablet Take 2,000 Units by mouth daily.    Marland Kitchen dexlansoprazole (DEXILANT) 60 MG capsule Take 60 mg by mouth daily.    Marland Kitchen docusate sodium (COLACE) 100 MG capsule Take 100 mg by mouth 2 (two) times daily.    Marland Kitchen escitalopram (LEXAPRO) 5 MG tablet Take 5 mg by mouth daily.    Marland Kitchen levothyroxine (SYNTHROID, LEVOTHROID) 75 MCG tablet Take 75 mcg by mouth daily.    Marland Kitchen losartan-hydrochlorothiazide (HYZAAR) 100-12.5 MG per tablet Take 1 tablet by mouth daily. 90 tablet 3  . metoprolol (LOPRESSOR) 50 MG tablet Take 1 tablet by mouth two  times daily 180 tablet 1  . Multiple Vitamin (MULITIVITAMIN WITH MINERALS) TABS Take 1 tablet by mouth daily.    Marland Kitchen oxybutynin (DITROPAN) 5 MG tablet Take 1 tablet by mouth 2 (two) times daily. Take 1 tab twice a day    . traMADol (ULTRAM) 50 MG tablet Take 100 mg by mouth 2 (two) times daily. Take 2 tablets twice daily     No current facility-administered medications for this visit.   Social history is notable in that she is now. Her husband did suffer a CVA and has been having more difficulty at home. She has one child, 2 step sons, one grandchild and 2 great-grandchildren. There is no tobacco or alcohol use. She does admit to frequent anxiety.  ROS General: Negative; No fevers, chills, or night sweats; positive for fatigue HEENT: Negative; No  changes in vision or hearing, sinus congestion, difficulty swallowing Pulmonary: Negative; No cough, wheezing, shortness of breath, hemoptysis Cardiovascular: Negative; No chest pain, presyncope, syncope, palpitations GI: Negative; No nausea, vomiting, diarrhea, or abdominal pain GU: Negative; No dysuria, hematuria, or difficulty voiding Musculoskeletal: Negative; no myalgias, joint pain, or weakness Hematologic/Oncology: Negative; no easy bruising, bleeding Endocrine: Negative; no heat/cold intolerance; no diabetes Neuro: Negative; no changes in balance, headaches Skin: Negative; No rashes or skin lesions Psychiatric: Negative; No behavioral problems, depression Sleep: Negative; No snoring, daytime sleepiness, hypersomnolence, bruxism, restless legs, hypnogognic hallucinations, no cataplexy Other comprehensive 14 point system review is negative.   PE BP 144/76 mmHg  Pulse 81  Ht 5\' 3"  (1.6 m)  Wt 181 lb 9 oz (82.356 kg)  BMI 32.17 kg/m2   Repeat blood pressure  by me was 138/78.  Wt Readings from Last 3 Encounters:  02/11/15 181 lb 9 oz (82.356 kg)  01/08/15 184 lb (83.462 kg)  08/21/14 179 lb (81.194 kg)   General: Alert, oriented, no distress.  HEENT: Normocephalic, atraumatic. Pupils round and reactive; sclera anicteric; Mouth/Parynx benign; Mallinpatti scale 2 Neck: No JVD, no carotid bruits Lungs: clear to ausculatation and percussion; no wheezing or rales Chest wall: Nontender to palpation  Heart: RRR, s1 s2 normal 2/6 SEM in the aortic region. No diastolic murmur the; no s3; no rubs thrills or heaves Abdomen: soft, nontender; no hepatosplenomehaly, BS+; abdominal aorta nontender and not dilated by palpation. Mildly obese Back: No CVA tenderness Pulses 2+ Extremities: no clubbinbg cyanosis or edema, Homan's sign negative  Neurologic: grossly nonfocal Psychologic: Normal affect.  ECG (independently read by me): 100% atrial pacing with a markedly prolonged PR interval  at 480 ms.  Ventricular sensing.  Incomplete right bundle branch block.  ECG (independently read by me): Atrial paced rhythm at 86 bpm with prolonged PR segment.  T-wave inversion  December 2015 ECG (independently read by me): Atrially paced rhythm with ventricular sensing with incomplete right bundle branch block.  Previously noted T-wave abnormalities  07/24/2013 ECG: Atrial lead paced rhythm.  Mild RV conduction delay.  Diffuse T-wave inversion has been present previously  Prior ECG today  (independently read by me): Bradycardia at 48 beats per minute. RV conduction delay. Previously noted diffuse inferior and anterolateral T wave inversion  Prior ECG of 01/20/2013: sinus bradycardia at 55 beats per minute. Previously noted diffuse T-wave inversion in leads 2F, V2 through V6. Mild RV conduction delay.  LABS: I have reviewed recent blood work done by Dr. Edwyna Ready call on 11/22/2014. BUN 20, currently 1.07.  Estimated GFR 48. Cholesterol 172, triglycerides 82, HDL 70, LDL 86.  Basic Metabolic Panel: No results for input(s): NA, K, CL, CO2, GLUCOSE, BUN, CREATININE, CALCIUM, MG, PHOS in the last 72 hours. Liver Function Tests: No results for input(s): AST, ALT, ALKPHOS, BILITOT, PROT, ALBUMIN in the last 72 hours. No results for input(s): LIPASE, AMYLASE in the last 72 hours. CBC: No results for input(s): WBC, NEUTROABS, HGB, HCT, MCV, PLT in the last 72 hours. Cardiac Enzymes: No results for input(s): CKTOTAL, CKMB, CKMBINDEX, TROPONINI in the last 72 hours. BNP: Invalid input(s): POCBNP D-Dimer: No results for input(s): DDIMER in the last 72 hours. Hemoglobin A1C: No results for input(s): HGBA1C in the last 72 hours. Fasting Lipid Panel: No results for input(s): CHOL, HDL, LDLCALC, TRIG, CHOLHDL, LDLDIRECT in the last 72 hours. Thyroid Function Tests: No results for input(s): TSH, T4TOTAL, T3FREE, THYROIDAB in the last 72 hours.  Invalid input(s): FREET3 Anemia Panel: No results  for input(s): VITAMINB12, FOLATE, FERRITIN, TIBC, IRON, RETICCTPCT in the last 72 hours.  RADIOLOGY: No results found.    ASSESSMENT AND PLAN:   Ms. Shimp is an 81 year old Caucasian female who has a history of SVT and documented moderate left ventricle hypertrophy the with a "spade-like ventricle."  She has diffuse T-wave abnormalities which have been present for years. In 2003 cardiac catheterization did not demonstrate coronary obstructive disease but she had very minimal midsystolic bridging not felt to be significant in her LAD territory.  She has a history of SVT, PAF, and had developed bradycardia arrhythmia resulting in her permanent pacemaker implantation last year.  He is unaware of any recurrent episodes of SVT, PAF, or other arrhythmia.  She has a ChadsVasc score of at least 3 and is  tolerating eliquis without bleeding side effects.  She continues to be on low-dose amiodarone at 200 mg daily.  She is now 100% atrially paced and has markedly prolonged AV conduction with a PR interval at 480 ms due to MVP program being turned on.  She is not having any chest pain.  Her renal function is stable.  Her blood pressure is stable on current therapy.  She is tolerating Crestor without myalgias with her most recent LDL at 86.  I will see her in 6 months for cardiology reevaluation.  Time spent: 25 minutes  Troy Sine M.D., [facility] 02/12/2015 7:19 PM

## 2015-03-09 ENCOUNTER — Other Ambulatory Visit: Payer: Self-pay | Admitting: Cardiovascular Disease

## 2015-03-11 ENCOUNTER — Encounter: Payer: Self-pay | Admitting: Cardiovascular Disease

## 2015-03-19 DIAGNOSIS — I2510 Atherosclerotic heart disease of native coronary artery without angina pectoris: Secondary | ICD-10-CM | POA: Diagnosis not present

## 2015-03-19 DIAGNOSIS — F411 Generalized anxiety disorder: Secondary | ICD-10-CM | POA: Diagnosis not present

## 2015-03-19 DIAGNOSIS — M545 Low back pain: Secondary | ICD-10-CM | POA: Diagnosis not present

## 2015-03-19 DIAGNOSIS — F339 Major depressive disorder, recurrent, unspecified: Secondary | ICD-10-CM | POA: Diagnosis not present

## 2015-03-19 DIAGNOSIS — M1710 Unilateral primary osteoarthritis, unspecified knee: Secondary | ICD-10-CM | POA: Diagnosis not present

## 2015-04-09 ENCOUNTER — Telehealth: Payer: Self-pay | Admitting: Cardiology

## 2015-04-09 ENCOUNTER — Ambulatory Visit (INDEPENDENT_AMBULATORY_CARE_PROVIDER_SITE_OTHER): Payer: Medicare Other | Admitting: *Deleted

## 2015-04-09 DIAGNOSIS — I495 Sick sinus syndrome: Secondary | ICD-10-CM

## 2015-04-09 NOTE — Telephone Encounter (Signed)
Spoke with pt and reminded pt of remote transmission that is due today. Pt verbalized understanding.   

## 2015-04-09 NOTE — Progress Notes (Signed)
Remote pacemaker transmission.   

## 2015-04-12 ENCOUNTER — Encounter: Payer: Self-pay | Admitting: Cardiology

## 2015-04-12 LAB — CUP PACEART REMOTE DEVICE CHECK
Battery Voltage: 3.02 V
Brady Statistic AP VP Percent: 0.27 %
Brady Statistic AP VS Percent: 99.03 %
Brady Statistic AS VP Percent: 0 %
Brady Statistic AS VS Percent: 0.7 %
Implantable Lead Implant Date: 20150430
Lead Channel Impedance Value: 399 Ohm
Lead Channel Impedance Value: 475 Ohm
Lead Channel Impedance Value: 551 Ohm
Lead Channel Pacing Threshold Amplitude: 0.625 V
Lead Channel Pacing Threshold Amplitude: 1 V
Lead Channel Pacing Threshold Pulse Width: 0.4 ms
Lead Channel Sensing Intrinsic Amplitude: 30.25 mV
Lead Channel Sensing Intrinsic Amplitude: 30.25 mV
Lead Channel Setting Pacing Amplitude: 2.25 V
Lead Channel Setting Pacing Amplitude: 2.5 V
Lead Channel Setting Pacing Pulse Width: 0.4 ms
MDC IDC LEAD IMPLANT DT: 20150430
MDC IDC LEAD LOCATION: 753859
MDC IDC LEAD LOCATION: 753860
MDC IDC MSMT BATTERY REMAINING LONGEVITY: 100 mo
MDC IDC MSMT LEADCHNL RA PACING THRESHOLD PULSEWIDTH: 0.4 ms
MDC IDC MSMT LEADCHNL RA SENSING INTR AMPL: 1.125 mV
MDC IDC MSMT LEADCHNL RA SENSING INTR AMPL: 1.125 mV
MDC IDC MSMT LEADCHNL RV IMPEDANCE VALUE: 627 Ohm
MDC IDC SESS DTM: 20170117200512
MDC IDC SET LEADCHNL RV SENSING SENSITIVITY: 4 mV
MDC IDC STAT BRADY RA PERCENT PACED: 99.3 %
MDC IDC STAT BRADY RV PERCENT PACED: 0.27 %

## 2015-05-24 DIAGNOSIS — M545 Low back pain: Secondary | ICD-10-CM | POA: Diagnosis not present

## 2015-05-24 DIAGNOSIS — M179 Osteoarthritis of knee, unspecified: Secondary | ICD-10-CM | POA: Diagnosis not present

## 2015-05-24 DIAGNOSIS — I2510 Atherosclerotic heart disease of native coronary artery without angina pectoris: Secondary | ICD-10-CM | POA: Diagnosis not present

## 2015-05-24 DIAGNOSIS — F411 Generalized anxiety disorder: Secondary | ICD-10-CM | POA: Diagnosis not present

## 2015-05-24 DIAGNOSIS — F339 Major depressive disorder, recurrent, unspecified: Secondary | ICD-10-CM | POA: Diagnosis not present

## 2015-06-06 DIAGNOSIS — M170 Bilateral primary osteoarthritis of knee: Secondary | ICD-10-CM | POA: Diagnosis not present

## 2015-06-06 DIAGNOSIS — M25561 Pain in right knee: Secondary | ICD-10-CM | POA: Diagnosis not present

## 2015-06-06 DIAGNOSIS — M25562 Pain in left knee: Secondary | ICD-10-CM | POA: Diagnosis not present

## 2015-06-08 ENCOUNTER — Other Ambulatory Visit: Payer: Self-pay | Admitting: Cardiovascular Disease

## 2015-06-11 DIAGNOSIS — L03211 Cellulitis of face: Secondary | ICD-10-CM | POA: Diagnosis not present

## 2015-06-14 ENCOUNTER — Encounter (HOSPITAL_COMMUNITY)
Admission: RE | Admit: 2015-06-14 | Discharge: 2015-06-14 | Disposition: A | Discharge status: Home / Self Care | Payer: Medicare Other | Source: Ambulatory Visit | Attending: Internal Medicine | Admitting: Internal Medicine

## 2015-06-14 DIAGNOSIS — L03211 Cellulitis of face: Secondary | ICD-10-CM | POA: Diagnosis not present

## 2015-06-14 MED ORDER — SODIUM CHLORIDE 0.9 % IV SOLN
INTRAVENOUS | Status: DC
  Administered 2015-06-14: 250 mL via INTRAVENOUS

## 2015-06-14 MED ORDER — ORITAVANCIN DIPHOSPHATE 400 MG IV SOLR
1200.0000 mg | Freq: Once | INTRAVENOUS | Status: AC
  Administered 2015-06-14: 1200 mg via INTRAVENOUS
  Filled 2015-06-14: qty 120

## 2015-06-14 NOTE — Progress Notes (Signed)
Cream of chicken soup and applesauce provided for lunch. Tolerated well.

## 2015-06-19 DIAGNOSIS — R531 Weakness: Secondary | ICD-10-CM | POA: Diagnosis not present

## 2015-06-19 DIAGNOSIS — L03211 Cellulitis of face: Secondary | ICD-10-CM | POA: Diagnosis not present

## 2015-06-21 DIAGNOSIS — L03211 Cellulitis of face: Secondary | ICD-10-CM | POA: Diagnosis not present

## 2015-07-04 ENCOUNTER — Telehealth: Payer: Self-pay | Admitting: Cardiovascular Disease

## 2015-07-04 DIAGNOSIS — E039 Hypothyroidism, unspecified: Secondary | ICD-10-CM | POA: Diagnosis not present

## 2015-07-04 DIAGNOSIS — E782 Mixed hyperlipidemia: Secondary | ICD-10-CM | POA: Diagnosis not present

## 2015-07-04 NOTE — Telephone Encounter (Signed)
Left message to call back  Only options are wait or see PA/NP

## 2015-07-04 NOTE — Telephone Encounter (Signed)
Follow up   Per pt re call letter she call back to make her May appt no openings. Pt would like a call back for an appointment in May.   I Offered July 24th [name] pt declined. She does not want to see the PA.

## 2015-07-04 NOTE — Telephone Encounter (Signed)
Per pt re call letter she call back to make her May appt no openings.    Pt would like a call back for an appointment in May.   She does not want to see the PA.

## 2015-07-05 NOTE — Telephone Encounter (Signed)
Follow up    Pt is retuning call for rn

## 2015-07-05 NOTE — Telephone Encounter (Signed)
Spoke with patient and she very upset she is not able to schedule an appointment with Dr Claiborne Billings  She refuses to see NP/PA since they made a "boo-boo" couple of years ago sending her to the hospital to get her heart shocked and ended up needed pacemaker Stated she only had confidence in Dr Claiborne Billings Patient thinks something cardiac going on because she "gives out" easily Has seen PCP had labs yesterday and follows up with them next week, but stated he did not know her heart Explained to patient Dr Claiborne Billings had no available appointments Patient was crying on the phone stating she only wanted to see Dr Claiborne Billings since he has been her doctor for so long and knows her.  Again advised no available appointments but she requested me contact Dr Claiborne Billings Will forward for review

## 2015-07-08 ENCOUNTER — Telehealth: Payer: Self-pay | Admitting: Cardiology

## 2015-07-08 ENCOUNTER — Ambulatory Visit (INDEPENDENT_AMBULATORY_CARE_PROVIDER_SITE_OTHER): Payer: Medicare Other | Admitting: *Deleted

## 2015-07-08 DIAGNOSIS — I495 Sick sinus syndrome: Secondary | ICD-10-CM | POA: Diagnosis not present

## 2015-07-08 NOTE — Telephone Encounter (Signed)
Patient aware verbalized understanding

## 2015-07-08 NOTE — Telephone Encounter (Signed)
OK to work in 4/27 am

## 2015-07-08 NOTE — Telephone Encounter (Signed)
Left message to call back Appointment schedule for 07/18/15 at 10 :30 am per Dr Claiborne Billings

## 2015-07-08 NOTE — Telephone Encounter (Signed)
Spoke with pt and reminded pt of remote transmission that is due today. Pt verbalized understanding.   

## 2015-07-08 NOTE — Progress Notes (Signed)
Remote pacemaker transmission.   

## 2015-07-09 DIAGNOSIS — I2510 Atherosclerotic heart disease of native coronary artery without angina pectoris: Secondary | ICD-10-CM | POA: Diagnosis not present

## 2015-07-09 DIAGNOSIS — M25511 Pain in right shoulder: Secondary | ICD-10-CM | POA: Diagnosis not present

## 2015-07-09 DIAGNOSIS — M1991 Primary osteoarthritis, unspecified site: Secondary | ICD-10-CM | POA: Diagnosis not present

## 2015-07-16 DIAGNOSIS — M5136 Other intervertebral disc degeneration, lumbar region: Secondary | ICD-10-CM | POA: Diagnosis not present

## 2015-07-18 ENCOUNTER — Encounter: Payer: Self-pay | Admitting: Cardiovascular Disease

## 2015-07-18 ENCOUNTER — Ambulatory Visit (INDEPENDENT_AMBULATORY_CARE_PROVIDER_SITE_OTHER): Payer: Medicare Other | Admitting: Cardiovascular Disease

## 2015-07-18 VITALS — BP 150/80 | HR 81 | Ht 63.0 in | Wt 171.0 lb

## 2015-07-18 DIAGNOSIS — I4891 Unspecified atrial fibrillation: Secondary | ICD-10-CM

## 2015-07-18 DIAGNOSIS — E785 Hyperlipidemia, unspecified: Secondary | ICD-10-CM

## 2015-07-18 DIAGNOSIS — I10 Essential (primary) hypertension: Secondary | ICD-10-CM | POA: Diagnosis not present

## 2015-07-18 DIAGNOSIS — I495 Sick sinus syndrome: Secondary | ICD-10-CM | POA: Diagnosis not present

## 2015-07-18 DIAGNOSIS — Z7901 Long term (current) use of anticoagulants: Secondary | ICD-10-CM

## 2015-07-18 DIAGNOSIS — Z950 Presence of cardiac pacemaker: Secondary | ICD-10-CM

## 2015-07-18 NOTE — Patient Instructions (Signed)
Your physician recommends that you schedule a follow-up appointment in: October with Dr. Sallyanne Kuster.  Your physician wants you to follow-up in: 6 months or sooner if needed. With Dr Claiborne Billings. You will receive a reminder letter in the mail two months in advance. If you don't receive a letter, please call our office to schedule the follow-up appointment.  If you need a refill on your cardiac medications before your next appointment, please call your pharmacy.

## 2015-07-20 ENCOUNTER — Encounter: Payer: Self-pay | Admitting: Cardiovascular Disease

## 2015-07-20 NOTE — Progress Notes (Signed)
Patient ID: Denise Pugh, female   DOB: 02-Jan-1929, 82 y.o.   MRN: [id number]     HPI: Denise Pugh is a 82 y.o. female who presents to the office today for a 6 month followup cardiology evaluation.  Denise Pugh has a history of SVTand moderate left ventricular hypertrophy with documented "spade-like ventricle."  She has previously documented diffuse T-wave abnormalities. A stress test in May 2013 showed breast attenuation artifact with a post stress ejection fraction of 70% without evidence for scar or ischemia. She had experienced recurrent episodes of palpitations. A cardiac monitor revealed episodes of SVT up to 185 beats per minute and her beta blocker dose was increased.  A CardioNet monitor on her increased dose of Toprol-XL 25 mg still revealed bursts of atrial tachycardia/A flutter rate at 189 beats per minute.  On  May 17 she had an episode of tachycardia palpitations and did also have episodes of bradycardia; some of her spells suggested SVT at 150  in addition to episode of accelerated idioventricular rhythm when she was sinus bradycardic.  A 2-D echo Doppler study on 09/15/2012 showed an ejection fraction of 0000000 1 diastolic dysfunction and elevated LV filling pressures.  The aortic valve was as sclerotic without stenosis and there was mild/moderate central regurgitation. 10 mild mitral regurgitation and mild tricuspid regurgitation with mild pulmonary hypertension with a PA estimate pressure at 47 mm.  In April 2015, she was admitted to [facility] hospital with atrial fibrillation with a rapid ventricular response.  She also had a CardioNet monitor which had shown episodes of sinus bradycardia, as well as PAF, with rates up to 170 beats per minute with also questionable nonsustained VT versus actual fibrillation with aberrancy.  She was started on amiodarone. Her Eliquis was held, and she underwent permanent pacemaker insertion.  She was seen in August 2015 by Dr. Sallyanne Kuster for  pacemaker follow-up.  Her underlying rhythm was sinus bradycardia at 38 bpm, and she was pacing the atrium greater than 99% of the time without hardly any episodes of ventricular pacing.  She recently saw Dr. Sallyanne Kuster for one-year evaluation in October 2016.  She has both sinus node dysfunction and AV conduction abnormalities.  Her pacemaker is programmed with MVP on and she has a very long AV conduction time so as to prevent ventricular pacing.  Since I last saw her in November 2016, she has continued to do well.  Her last pacemaker evaluation with Dr. Sallyanne Kuster was in October 2016.  She has been undergoing 3 month interval device checks.  She has seen Dr. ramus for back discomfort and pain management.  She denies any episodes of chest pain.  She admits to some mild leg swelling at her ankles.  She has had recent blood work by Dr. Wende Neighbors and I reviewed this.  She presents for evaluation.  Past Medical History  Diagnosis Date  . Thyroid disease   . Arthritis   . Irregular heart beat   . Arrhythmia     HX of SVT. CARDIONET MONITOR 07/15/12 TO 08/13/12  . Sleep apnea     SLEEP STUDY-[facility]  . Hypertension 08/25/10    ECHO-EF 60-65%  . Hyperlipidemia 08/06/11    lexiscan myoview-normal. Due to severe attenuation the study specificity and sensitivty are deminished.  . Shortness of breath   . Hypothyroidism   . GERD (gastroesophageal reflux disease)     Past Surgical History  Procedure Laterality Date  . Knee arthroscopy    .  Cardiac catheterization  07/06/01    spade-like configuration  . Eye surgery    . Dilation and curettage of uterus    . Permanent pacemaker insertion N/A 07/20/2013    Procedure: PERMANENT PACEMAKER INSERTION;  Surgeon: [name], MD;  Location: [place] CATH [facility];  Service: Cardiovascular;  Laterality: N/A;    Allergies  Allergen Reactions  . Penicillins Other (See Comments)    tingling  . Tetanus Toxoid Swelling    Current Outpatient  Prescriptions  Medication Sig Dispense Refill  . acetaminophen (TYLENOL) 500 MG tablet Take 500 mg by mouth as needed (pain). For fever    . amiodarone (PACERONE) 200 MG tablet Take 1 tablet by mouth  daily 90 tablet 0  . apixaban (ELIQUIS) 5 MG TABS tablet Take 1 tablet (5 mg total) by mouth 2 (two) times daily. 42 tablet 0  . atorvastatin (LIPITOR) 20 MG tablet Take 1 tablet by mouth  daily 90 tablet 0  . Cholecalciferol (VITAMIN D) 2000 UNITS tablet Take 2,000 Units by mouth daily.    Marland Kitchen dexlansoprazole (DEXILANT) 60 MG capsule Take 60 mg by mouth daily.    Marland Kitchen docusate sodium (COLACE) 100 MG capsule Take 100 mg by mouth 2 (two) times daily.    Marland Kitchen escitalopram (LEXAPRO) 5 MG tablet Take 5 mg by mouth daily.    Marland Kitchen levothyroxine (SYNTHROID, LEVOTHROID) 75 MCG tablet Take 75 mcg by mouth daily.    Marland Kitchen losartan-hydrochlorothiazide (HYZAAR) 100-12.5 MG tablet Take 1 tablet by mouth  daily 90 tablet 0  . metoprolol (LOPRESSOR) 50 MG tablet Take 1 tablet by mouth two  times daily 180 tablet 1  . oxybutynin (DITROPAN) 5 MG tablet Take 1 tablet by mouth 2 (two) times daily. Take 1 tab twice a day    . traMADol (ULTRAM) 50 MG tablet Take 100 mg by mouth 2 (two) times daily. Take 2 tablets twice daily     No current facility-administered medications for this visit.   Social history is notable in that she is now. Her husband did suffer a CVA and has been having more difficulty at home. She has one child, 2 step sons, one grandchild and 2 great-grandchildren. There is no tobacco or alcohol use. She does admit to frequent anxiety.  ROS General: Negative; No fevers, chills, or night sweats; positive for fatigue HEENT: Negative; No changes in vision or hearing, sinus congestion, difficulty swallowing Pulmonary: Negative; No cough, wheezing, shortness of breath, hemoptysis Cardiovascular: Negative; No chest pain, presyncope, syncope, palpitations GI: Negative; No nausea, vomiting, diarrhea, or abdominal  pain GU: Negative; No dysuria, hematuria, or difficulty voiding Musculoskeletal: Negative; no myalgias, joint pain, or weakness Hematologic/Oncology: Negative; no easy bruising, bleeding Endocrine: Negative; no heat/cold intolerance; no diabetes Neuro: Negative; no changes in balance, headaches Skin: Negative; No rashes or skin lesions Psychiatric: Negative; No behavioral problems, depression Sleep: Negative; No snoring, daytime sleepiness, hypersomnolence, bruxism, restless legs, hypnogognic hallucinations, no cataplexy Other comprehensive 14 point system review is negative.   PE BP 150/80 mmHg  Pulse 81  Ht 5\' 3"  (1.6 m)  Wt 171 lb (77.565 kg)  BMI 30.30 kg/m2   Repeat blood pressure by me was 130/70.  Wt Readings from Last 3 Encounters:  07/18/15 171 lb (77.565 kg)  02/11/15 181 lb 9 oz (82.356 kg)  01/08/15 184 lb (83.462 kg)   General: Alert, oriented, no distress.  HEENT: Normocephalic, atraumatic. Pupils round and reactive; sclera anicteric; Mouth/Parynx benign; Mallinpatti scale 2 Neck: No JVD, no carotid bruits Lungs:  clear to ausculatation and percussion; no wheezing or rales Chest wall: Nontender to palpation  Heart: RRR, s1 s2 normal 2/6 SEM in the aortic region. No diastolic murmur the; no s3; no rubs thrills or heaves Abdomen: soft, nontender; no hepatosplenomehaly, BS+; abdominal aorta nontender and not dilated by palpation. Mildly obese Back: No CVA tenderness Pulses 2+ Extremities: no clubbinbg cyanosis or edema, Homan's sign negative  Neurologic: grossly nonfocal Psychologic: Normal affect.  ECG (independently read by me): Sinus rhythm with markedly prolonged AV interval at ~ 440 msec.  Right bundle branch block  November 2016 ECG (independently read by me): 100% atrial pacing with a markedly prolonged PR interval at 480 ms.  Ventricular sensing.  Incomplete right bundle branch block.  ECG (independently read by me): Atrial paced rhythm at 86 bpm with  prolonged PR segment.  T-wave inversion  December 2015 ECG (independently read by me): Atrially paced rhythm with ventricular sensing with incomplete right bundle branch block.  Previously noted T-wave abnormalities  07/24/2013 ECG: Atrial lead paced rhythm.  Mild RV conduction delay.  Diffuse T-wave inversion has been present previously  Prior ECG today  (independently read by me): Bradycardia at 48 beats per minute. RV conduction delay. Previously noted diffuse inferior and anterolateral T wave inversion  Prior ECG of 01/20/2013: sinus bradycardia at 55 beats per minute. Previously noted diffuse T-wave inversion in leads 52F, V2 through V6. Mild RV conduction delay.  LABS: I have reviewed recent blood work done by Dr. Wende Neighbors from 07/04/2015 BUN 14, Cr 0.9 , which are both improved from September 2016.  Estimated GFR 59. Cholesterol 150, triglycerides 61, HDL 65, LDL 73; improved from previous assessment.  Basic Metabolic Panel: No results for input(s): NA, K, CL, CO2, GLUCOSE, BUN, CREATININE, CALCIUM, MG, PHOS in the last 72 hours. Liver Function Tests: No results for input(s): AST, ALT, ALKPHOS, BILITOT, PROT, ALBUMIN in the last 72 hours. No results for input(s): LIPASE, AMYLASE in the last 72 hours. CBC: No results for input(s): WBC, NEUTROABS, HGB, HCT, MCV, PLT in the last 72 hours. Cardiac Enzymes: No results for input(s): CKTOTAL, CKMB, CKMBINDEX, TROPONINI in the last 72 hours. BNP: Invalid input(s): POCBNP D-Dimer: No results for input(s): DDIMER in the last 72 hours. Hemoglobin A1C: No results for input(s): HGBA1C in the last 72 hours. Fasting Lipid Panel: No results for input(s): CHOL, HDL, LDLCALC, TRIG, CHOLHDL, LDLDIRECT in the last 72 hours. Thyroid Function Tests: No results for input(s): TSH, T4TOTAL, T3FREE, THYROIDAB in the last 72 hours.  Invalid input(s): FREET3 Anemia Panel: No results for input(s): VITAMINB12, FOLATE, FERRITIN, TIBC, IRON, RETICCTPCT  in the last 72 hours.  RADIOLOGY: No results found.    ASSESSMENT AND PLAN:   Denise Pugh is an 82 year old Caucasian female who has a history of SVT and documented moderate left ventricle hypertrophy the with a "spade-like ventricle."  She has diffuse T-wave abnormalities which have been present for years. In 2003 cardiac catheterization did not demonstrate coronary obstructive disease but she had mild midsystolic bridging not felt to be significant in her LAD territory.  She has a history of SVT, PAF, and had developed bradycardia arrhythmia resulting in her permanent pacemaker implantation last year.  She denies any episodes of chest pain, or recurrent arrhythmia.  He is unaware of any recurrent episodes of SVT, PAF, or other arrhythmia.  She has a ChadsVasc score of at least 3 and is tolerating eliquis without bleeding side effects.  She continues to be on low-dose amiodarone  at 200 mg daily.  She is  100% atrially paced and has markedly prolonged AV conduction with a PR interval  due to MVP program being turned on.  She is not having any chest pain.  Her renal function is stable.  Her blood pressure is stable on current therapy.  She is tolerating Crestor without myalgias with her most recent LDL at 73 improved from 86.  I have recommended a one-year follow-up for her to see Dr. Sallyanne Kuster in October 2017.  I will see her in one year for follow-up evaluation in April 2018. Time spent: 25 minutes  Troy Sine M.D., [facility] 07/20/2015 2:24 PM

## 2015-07-24 ENCOUNTER — Telehealth: Payer: Self-pay | Admitting: Cardiovascular Disease

## 2015-07-24 NOTE — Telephone Encounter (Signed)
New  Message       Request for surgical clearance:  1. What type of surgery is being performed? Back injection   When is this surgery scheduled? 08-13-15 2. Are there any medications that need to be held prior to surgery and how long? Hold eliquis 3 day prior Name of physician performing surgery?  Dr Nelva Bush 3. What is your office phone and fax number? [phone]

## 2015-07-24 NOTE — Telephone Encounter (Signed)
FORWARD TO DR [name]

## 2015-07-28 NOTE — Telephone Encounter (Signed)
OK for surgery and hold eliquis for 3 days

## 2015-07-29 NOTE — Telephone Encounter (Signed)
INFORMED Denise Pugh  - ROUTED INFORMATION

## 2015-08-13 DIAGNOSIS — M5136 Other intervertebral disc degeneration, lumbar region: Secondary | ICD-10-CM | POA: Diagnosis not present

## 2015-08-15 ENCOUNTER — Encounter: Payer: Self-pay | Admitting: Cardiology

## 2015-08-15 LAB — CUP PACEART REMOTE DEVICE CHECK
Brady Statistic AP VP Percent: 0.19 %
Brady Statistic AP VS Percent: 99.45 %
Brady Statistic RV Percent Paced: 0.19 %
Implantable Lead Implant Date: 20150430
Implantable Lead Location: 753859
Implantable Lead Model: 5076
Lead Channel Impedance Value: 494 Ohm
Lead Channel Impedance Value: 570 Ohm
Lead Channel Sensing Intrinsic Amplitude: 1 mV
Lead Channel Sensing Intrinsic Amplitude: 31.625 mV
Lead Channel Setting Pacing Amplitude: 2.5 V
Lead Channel Setting Sensing Sensitivity: 4 mV
MDC IDC LEAD IMPLANT DT: 20150430
MDC IDC LEAD LOCATION: 753860
MDC IDC MSMT BATTERY REMAINING LONGEVITY: 92 mo
MDC IDC MSMT BATTERY VOLTAGE: 3.01 V
MDC IDC MSMT LEADCHNL RA IMPEDANCE VALUE: 361 Ohm
MDC IDC MSMT LEADCHNL RA IMPEDANCE VALUE: 456 Ohm
MDC IDC MSMT LEADCHNL RA PACING THRESHOLD AMPLITUDE: 1.125 V
MDC IDC MSMT LEADCHNL RA PACING THRESHOLD PULSEWIDTH: 0.4 ms
MDC IDC MSMT LEADCHNL RA SENSING INTR AMPL: 1 mV
MDC IDC MSMT LEADCHNL RV PACING THRESHOLD AMPLITUDE: 0.75 V
MDC IDC MSMT LEADCHNL RV PACING THRESHOLD PULSEWIDTH: 0.4 ms
MDC IDC MSMT LEADCHNL RV SENSING INTR AMPL: 31.625 mV
MDC IDC SESS DTM: 20170417180056
MDC IDC SET LEADCHNL RA PACING AMPLITUDE: 2.25 V
MDC IDC SET LEADCHNL RV PACING PULSEWIDTH: 0.4 ms
MDC IDC STAT BRADY AS VP PERCENT: 0 %
MDC IDC STAT BRADY AS VS PERCENT: 0.36 %
MDC IDC STAT BRADY RA PERCENT PACED: 99.64 %

## 2015-08-20 ENCOUNTER — Other Ambulatory Visit: Payer: Self-pay

## 2015-08-20 ENCOUNTER — Other Ambulatory Visit: Payer: Self-pay | Admitting: *Deleted

## 2015-08-20 MED ORDER — APIXABAN 5 MG PO TABS: 5.0000 mg | ORAL_TABLET | Freq: Two times a day (BID) | ORAL | Status: DC

## 2015-08-20 NOTE — Telephone Encounter (Signed)
apixaban (ELIQUIS) 5 MG TABS tablet  Medication   Date: 08/20/2015  Department: [facility]  Ordering/Authorizing: Troy Sine, MD      Order Providers    Prescribing Provider Encounter Provider   Troy Sine, MD Milderd Meager, CMA    Medication Detail      Disp Refills Start End     apixaban (ELIQUIS) 5 MG TABS tablet 90 tablet 1 08/20/2015     Sig - Route: Take 1 tablet (5 mg total) by mouth 2 (two) times daily. - Oral    E-Prescribing Status: Receipt confirmed by pharmacy (08/20/2015 1:51 PM EDT)     Pharmacy    [facility]

## 2015-08-21 ENCOUNTER — Other Ambulatory Visit: Payer: Self-pay | Admitting: *Deleted

## 2015-08-21 MED ORDER — APIXABAN 5 MG PO TABS: 5.0000 mg | ORAL_TABLET | Freq: Two times a day (BID) | ORAL | Status: DC

## 2015-09-05 DIAGNOSIS — M5136 Other intervertebral disc degeneration, lumbar region: Secondary | ICD-10-CM | POA: Diagnosis not present

## 2015-09-09 ENCOUNTER — Other Ambulatory Visit: Payer: Self-pay | Admitting: Cardiovascular Disease

## 2015-09-12 ENCOUNTER — Other Ambulatory Visit: Payer: Self-pay | Admitting: Cardiovascular Disease

## 2015-09-12 NOTE — Telephone Encounter (Signed)
Rx(s) sent to pharmacy electronically.  

## 2015-10-02 DIAGNOSIS — M5136 Other intervertebral disc degeneration, lumbar region: Secondary | ICD-10-CM | POA: Diagnosis not present

## 2015-10-07 ENCOUNTER — Ambulatory Visit (INDEPENDENT_AMBULATORY_CARE_PROVIDER_SITE_OTHER): Payer: Medicare Other | Admitting: *Deleted

## 2015-10-07 ENCOUNTER — Telehealth: Payer: Self-pay | Admitting: Cardiovascular Disease

## 2015-10-07 ENCOUNTER — Telehealth: Payer: Self-pay | Admitting: Cardiology

## 2015-10-07 DIAGNOSIS — I495 Sick sinus syndrome: Secondary | ICD-10-CM

## 2015-10-07 NOTE — Telephone Encounter (Signed)
Spoke with pt and reminded pt of remote transmission that is due today. Pt verbalized understanding.   

## 2015-10-07 NOTE — Telephone Encounter (Signed)
New message  Pt calling to to get insturction on sending transmission. Please call back to discuss

## 2015-10-07 NOTE — Telephone Encounter (Signed)
Monitor has error code 3230. I helped Denise Pugh troubleshoot monitor- reader needs to charge in order to send transmission. She is aware to wait a few hours to send or send transmission tomorrow.

## 2015-10-08 NOTE — Progress Notes (Signed)
Remote pacemaker transmission.   

## 2015-10-09 ENCOUNTER — Encounter: Payer: Self-pay | Admitting: Cardiology

## 2015-10-10 LAB — CUP PACEART REMOTE DEVICE CHECK
Brady Statistic AP VP Percent: 0.22 %
Brady Statistic AP VS Percent: 99.68 %
Brady Statistic AS VS Percent: 0.1 %
Brady Statistic RA Percent Paced: 99.9 %
Brady Statistic RV Percent Paced: 0.22 %
Implantable Lead Implant Date: 20150430
Implantable Lead Location: 753859
Implantable Lead Location: 753860
Implantable Lead Model: 5076
Lead Channel Impedance Value: 513 Ohm
Lead Channel Impedance Value: 551 Ohm
Lead Channel Impedance Value: 627 Ohm
Lead Channel Pacing Threshold Amplitude: 0.75 V
Lead Channel Sensing Intrinsic Amplitude: 1.875 mV
Lead Channel Sensing Intrinsic Amplitude: 31.625 mV
Lead Channel Sensing Intrinsic Amplitude: 31.625 mV
Lead Channel Setting Pacing Amplitude: 2.25 V
Lead Channel Setting Pacing Pulse Width: 0.4 ms
Lead Channel Setting Sensing Sensitivity: 4 mV
MDC IDC LEAD IMPLANT DT: 20150430
MDC IDC MSMT BATTERY REMAINING LONGEVITY: 89 mo
MDC IDC MSMT BATTERY VOLTAGE: 3.01 V
MDC IDC MSMT LEADCHNL RA IMPEDANCE VALUE: 399 Ohm
MDC IDC MSMT LEADCHNL RA SENSING INTR AMPL: 1.875 mV
MDC IDC MSMT LEADCHNL RV PACING THRESHOLD PULSEWIDTH: 0.4 ms
MDC IDC SESS DTM: 20170718144957
MDC IDC SET LEADCHNL RV PACING AMPLITUDE: 2.5 V
MDC IDC STAT BRADY AS VP PERCENT: 0 %

## 2015-10-28 DIAGNOSIS — M5136 Other intervertebral disc degeneration, lumbar region: Secondary | ICD-10-CM | POA: Diagnosis not present

## 2015-12-10 DIAGNOSIS — M545 Low back pain: Secondary | ICD-10-CM | POA: Diagnosis not present

## 2015-12-10 DIAGNOSIS — M9902 Segmental and somatic dysfunction of thoracic region: Secondary | ICD-10-CM | POA: Diagnosis not present

## 2015-12-10 DIAGNOSIS — M9903 Segmental and somatic dysfunction of lumbar region: Secondary | ICD-10-CM | POA: Diagnosis not present

## 2015-12-10 DIAGNOSIS — M9905 Segmental and somatic dysfunction of pelvic region: Secondary | ICD-10-CM | POA: Diagnosis not present

## 2015-12-12 DIAGNOSIS — M9902 Segmental and somatic dysfunction of thoracic region: Secondary | ICD-10-CM | POA: Diagnosis not present

## 2015-12-12 DIAGNOSIS — M9903 Segmental and somatic dysfunction of lumbar region: Secondary | ICD-10-CM | POA: Diagnosis not present

## 2015-12-12 DIAGNOSIS — M545 Low back pain: Secondary | ICD-10-CM | POA: Diagnosis not present

## 2015-12-12 DIAGNOSIS — M9905 Segmental and somatic dysfunction of pelvic region: Secondary | ICD-10-CM | POA: Diagnosis not present

## 2015-12-16 DIAGNOSIS — M545 Low back pain: Secondary | ICD-10-CM | POA: Diagnosis not present

## 2015-12-16 DIAGNOSIS — M9902 Segmental and somatic dysfunction of thoracic region: Secondary | ICD-10-CM | POA: Diagnosis not present

## 2015-12-16 DIAGNOSIS — M9903 Segmental and somatic dysfunction of lumbar region: Secondary | ICD-10-CM | POA: Diagnosis not present

## 2015-12-16 DIAGNOSIS — M9905 Segmental and somatic dysfunction of pelvic region: Secondary | ICD-10-CM | POA: Diagnosis not present

## 2015-12-19 DIAGNOSIS — M9902 Segmental and somatic dysfunction of thoracic region: Secondary | ICD-10-CM | POA: Diagnosis not present

## 2015-12-19 DIAGNOSIS — M545 Low back pain: Secondary | ICD-10-CM | POA: Diagnosis not present

## 2015-12-19 DIAGNOSIS — M9905 Segmental and somatic dysfunction of pelvic region: Secondary | ICD-10-CM | POA: Diagnosis not present

## 2015-12-19 DIAGNOSIS — M9903 Segmental and somatic dysfunction of lumbar region: Secondary | ICD-10-CM | POA: Diagnosis not present

## 2015-12-23 DIAGNOSIS — M9903 Segmental and somatic dysfunction of lumbar region: Secondary | ICD-10-CM | POA: Diagnosis not present

## 2015-12-23 DIAGNOSIS — M9902 Segmental and somatic dysfunction of thoracic region: Secondary | ICD-10-CM | POA: Diagnosis not present

## 2015-12-23 DIAGNOSIS — M9905 Segmental and somatic dysfunction of pelvic region: Secondary | ICD-10-CM | POA: Diagnosis not present

## 2015-12-23 DIAGNOSIS — M545 Low back pain: Secondary | ICD-10-CM | POA: Diagnosis not present

## 2015-12-26 DIAGNOSIS — M545 Low back pain: Secondary | ICD-10-CM | POA: Diagnosis not present

## 2015-12-26 DIAGNOSIS — M9903 Segmental and somatic dysfunction of lumbar region: Secondary | ICD-10-CM | POA: Diagnosis not present

## 2015-12-26 DIAGNOSIS — M9905 Segmental and somatic dysfunction of pelvic region: Secondary | ICD-10-CM | POA: Diagnosis not present

## 2015-12-26 DIAGNOSIS — M9902 Segmental and somatic dysfunction of thoracic region: Secondary | ICD-10-CM | POA: Diagnosis not present

## 2015-12-27 DIAGNOSIS — E039 Hypothyroidism, unspecified: Secondary | ICD-10-CM | POA: Diagnosis not present

## 2015-12-27 DIAGNOSIS — E782 Mixed hyperlipidemia: Secondary | ICD-10-CM | POA: Diagnosis not present

## 2015-12-27 DIAGNOSIS — D509 Iron deficiency anemia, unspecified: Secondary | ICD-10-CM | POA: Diagnosis not present

## 2015-12-30 DIAGNOSIS — M9905 Segmental and somatic dysfunction of pelvic region: Secondary | ICD-10-CM | POA: Diagnosis not present

## 2015-12-30 DIAGNOSIS — M9903 Segmental and somatic dysfunction of lumbar region: Secondary | ICD-10-CM | POA: Diagnosis not present

## 2015-12-30 DIAGNOSIS — M9902 Segmental and somatic dysfunction of thoracic region: Secondary | ICD-10-CM | POA: Diagnosis not present

## 2015-12-30 DIAGNOSIS — M545 Low back pain: Secondary | ICD-10-CM | POA: Diagnosis not present

## 2016-01-01 DIAGNOSIS — E039 Hypothyroidism, unspecified: Secondary | ICD-10-CM | POA: Diagnosis not present

## 2016-01-01 DIAGNOSIS — Z23 Encounter for immunization: Secondary | ICD-10-CM | POA: Diagnosis not present

## 2016-01-01 DIAGNOSIS — I482 Chronic atrial fibrillation: Secondary | ICD-10-CM | POA: Diagnosis not present

## 2016-01-01 DIAGNOSIS — I2510 Atherosclerotic heart disease of native coronary artery without angina pectoris: Secondary | ICD-10-CM | POA: Diagnosis not present

## 2016-01-02 DIAGNOSIS — M9905 Segmental and somatic dysfunction of pelvic region: Secondary | ICD-10-CM | POA: Diagnosis not present

## 2016-01-02 DIAGNOSIS — M545 Low back pain: Secondary | ICD-10-CM | POA: Diagnosis not present

## 2016-01-02 DIAGNOSIS — M9903 Segmental and somatic dysfunction of lumbar region: Secondary | ICD-10-CM | POA: Diagnosis not present

## 2016-01-02 DIAGNOSIS — M9902 Segmental and somatic dysfunction of thoracic region: Secondary | ICD-10-CM | POA: Diagnosis not present

## 2016-01-06 DIAGNOSIS — M9903 Segmental and somatic dysfunction of lumbar region: Secondary | ICD-10-CM | POA: Diagnosis not present

## 2016-01-06 DIAGNOSIS — M545 Low back pain: Secondary | ICD-10-CM | POA: Diagnosis not present

## 2016-01-06 DIAGNOSIS — M9905 Segmental and somatic dysfunction of pelvic region: Secondary | ICD-10-CM | POA: Diagnosis not present

## 2016-01-06 DIAGNOSIS — M9902 Segmental and somatic dysfunction of thoracic region: Secondary | ICD-10-CM | POA: Diagnosis not present

## 2016-01-09 ENCOUNTER — Ambulatory Visit: Payer: Self-pay | Admitting: Cardiovascular Disease

## 2016-01-09 DIAGNOSIS — M9902 Segmental and somatic dysfunction of thoracic region: Secondary | ICD-10-CM | POA: Diagnosis not present

## 2016-01-09 DIAGNOSIS — M9903 Segmental and somatic dysfunction of lumbar region: Secondary | ICD-10-CM | POA: Diagnosis not present

## 2016-01-09 DIAGNOSIS — M9905 Segmental and somatic dysfunction of pelvic region: Secondary | ICD-10-CM | POA: Diagnosis not present

## 2016-01-09 DIAGNOSIS — M545 Low back pain: Secondary | ICD-10-CM | POA: Diagnosis not present

## 2016-01-13 ENCOUNTER — Encounter: Payer: Self-pay | Admitting: Cardiovascular Disease

## 2016-01-13 ENCOUNTER — Ambulatory Visit (INDEPENDENT_AMBULATORY_CARE_PROVIDER_SITE_OTHER): Payer: Medicare Other | Admitting: Cardiovascular Disease

## 2016-01-13 VITALS — BP 134/76 | HR 66 | Ht 63.0 in | Wt 161.2 lb

## 2016-01-13 DIAGNOSIS — I495 Sick sinus syndrome: Secondary | ICD-10-CM | POA: Diagnosis not present

## 2016-01-13 DIAGNOSIS — Z950 Presence of cardiac pacemaker: Secondary | ICD-10-CM

## 2016-01-13 DIAGNOSIS — Z79899 Other long term (current) drug therapy: Secondary | ICD-10-CM

## 2016-01-13 DIAGNOSIS — I440 Atrioventricular block, first degree: Secondary | ICD-10-CM | POA: Diagnosis not present

## 2016-01-13 DIAGNOSIS — I480 Paroxysmal atrial fibrillation: Secondary | ICD-10-CM

## 2016-01-13 DIAGNOSIS — I422 Other hypertrophic cardiomyopathy: Secondary | ICD-10-CM

## 2016-01-13 DIAGNOSIS — Z5181 Encounter for therapeutic drug level monitoring: Secondary | ICD-10-CM

## 2016-01-13 DIAGNOSIS — Z7901 Long term (current) use of anticoagulants: Secondary | ICD-10-CM | POA: Insufficient documentation

## 2016-01-13 NOTE — Progress Notes (Signed)
Cardiology Office Note    Date:  01/13/2016   ID:  Denise Pugh, DOB 18-Sep-1928, MRN [id number]  PCP:  Wende Neighbors, MD  Cardiologist:  [name], M.D.; [name], MD   Chief Complaint  Patient presents with  . Follow-up    no chest pain, shortness of breath-when walking, has edema in left ankle, has dizziness when laying to sitting-done better    History of Present Illness:  Denise Pugh is a 82 y.o. female with a history of sinus node dysfunction here for pacemaker follow-up. She is followed by [name] for hypertension, hyperlipidemia, sleep apnea. She has moderate LVH with a configuration suggesting apical variant hypertrophic cardiomyopathy. She has previously had paroxysmal atrial fibrillation (2015) and takes amiodarone and Eliquis.  He has not had any new health challenges since her last appointment 1 year ago. She has been compliant with remote pacemaker download. Presenting rhythm is atrial paced ventricular sensed with a long AV delay. According to pacemaker check this is her rhythm 99.5% of the time. Ventricular pacing only occurs 0.2% of the time. The underlying rhythm is severe sinus bradycardia in the 30s. Her dual-chamber Medtronic Advisa pacemaker was implanted in 2015 and still has roughly 6.5 years of expected longevity. There has been no detected atrial fibrillation or ventricular tachycardia since her last device download. The pacemaker site appears healthy  Past Medical History:  Diagnosis Date  . Arrhythmia    HX of SVT. CARDIONET MONITOR 07/15/12 TO 08/13/12  . Arthritis   . GERD (gastroesophageal reflux disease)   . Hyperlipidemia 08/06/11   lexiscan myoview-normal. Due to severe attenuation the study specificity and sensitivty are deminished.  . Hypertension 08/25/10   ECHO-EF 60-65%  . Hypothyroidism   . Irregular heart beat   . Shortness of breath   . Sleep apnea    SLEEP STUDY-[facility]  . Thyroid disease     Past  Surgical History:  Procedure Laterality Date  . CARDIAC CATHETERIZATION  07/06/01   spade-like configuration  . DILATION AND CURETTAGE OF UTERUS    . EYE SURGERY    . KNEE ARTHROSCOPY    . PERMANENT PACEMAKER INSERTION N/A 07/20/2013   Procedure: PERMANENT PACEMAKER INSERTION;  Surgeon: [name], MD;  Location: [facility] CATH [facility];  Service: Cardiovascular;  Laterality: N/A;    Current Medications: Outpatient Medications Prior to Visit  Medication Sig Dispense Refill  . acetaminophen (TYLENOL) 500 MG tablet Take 500 mg by mouth as needed (pain). For fever    . amiodarone (PACERONE) 200 MG tablet Take 1 tablet by mouth  daily 90 tablet 1  . apixaban (ELIQUIS) 5 MG TABS tablet Take 1 tablet (5 mg total) by mouth 2 (two) times daily. 180 tablet 3  . atorvastatin (LIPITOR) 20 MG tablet Take 1 tablet by mouth  daily 90 tablet 0  . Cholecalciferol (VITAMIN D) 2000 UNITS tablet Take 2,000 Units by mouth daily.    Marland Kitchen dexlansoprazole (DEXILANT) 60 MG capsule Take 60 mg by mouth daily.    Marland Kitchen docusate sodium (COLACE) 100 MG capsule Take 100 mg by mouth 2 (two) times daily.    Marland Kitchen escitalopram (LEXAPRO) 5 MG tablet Take 5 mg by mouth daily.    Marland Kitchen levothyroxine (SYNTHROID, LEVOTHROID) 75 MCG tablet Take 75 mcg by mouth daily.    Marland Kitchen losartan-hydrochlorothiazide (HYZAAR) 100-12.5 MG tablet Take 1 tablet by mouth daily. 90 tablet 1  . metoprolol (LOPRESSOR) 50 MG tablet Take 1 tablet (50 mg  total) by mouth 2 (two) times daily. 180 tablet 1  . oxybutynin (DITROPAN) 5 MG tablet Take 1 tablet by mouth 2 (two) times daily. Take 1 tab twice a day    . traMADol (ULTRAM) 50 MG tablet Take 100 mg by mouth 2 (two) times daily. Take 2 tablets twice daily     No facility-administered medications prior to visit.      Allergies:   Penicillins and Tetanus toxoid   Social History   Social History  . Marital status: Married    Spouse name: N/A  . Number of children: N/A  . Years of education: N/A   Social History  Main Topics  . Smoking status: Never Smoker  . Smokeless tobacco: Never Used  . Alcohol use No  . Drug use: No  . Sexual activity: No   Other Topics Concern  . None   Social History Narrative  . None     Family History:  The patient's family history includes Diabetes in her father and mother; Heart attack in her maternal grandfather and maternal grandmother; Hypertension in her father.   ROS:   Please see the history of present illness.    ROS All other systems reviewed and are negative.   PHYSICAL EXAM:   VS:  BP 134/76 (BP Location: Left Arm, Patient Position: Sitting, Cuff Size: Normal)   Pulse 66   Ht 5\' 3"  (1.6 m)   Wt 161 lb 4 oz (73.1 kg)   BMI 28.56 kg/m    GEN: Well nourished, well developed, in no acute distress  HEENT: normal  Neck: no JVD, carotid bruits, or masses Cardiac: Healthy left subclavian pacemaker scar;RRR; no murmurs, rubs, or gallops,no edema  Respiratory:  clear to auscultation bilaterally, normal work of breathing GI: soft, nontender, nondistended, + BS MS: no deformity or atrophy  Skin: warm and dry, no rash Neuro:  Alert and Oriented x 3, Strength and sensation are intact Psych: euthymic mood, full affect  Wt Readings from Last 3 Encounters:  01/13/16 161 lb 4 oz (73.1 kg)  07/18/15 171 lb (77.6 kg)  02/11/15 181 lb 9 oz (82.4 kg)      Studies/Labs Reviewed:   EKG:  EKG is ordered today.  The ekg ordered today demonstrates Atrial paced ventricular sensed rhythm with a long AV delay, incomplete right bundle branch block (QRS 112 ms), QTC 461 ms, T-wave inversion in leads V1-V5  Recent Labs: No results found for requested labs within last 8760 hours.   Lipid Panel    Component Value Date/Time   CHOL 120 07/19/2013 0539   TRIG 52 07/19/2013 0539   HDL 45 07/19/2013 0539   CHOLHDL 2.7 07/19/2013 0539   VLDL 10 07/19/2013 0539   LDLCALC 65 07/19/2013 0539    Additional studies/ records that were reviewed today include:  Notes  from [name]    ASSESSMENT:    1. SSS (sick sinus syndrome) ([place])   2. S/P cardiac pacemaker procedure   3. PAF (paroxysmal atrial fibrillation) ([place])   4. First degree AV block   5. Hypertrophic cardiomyopathy ([place])   6. Encounter for monitoring amiodarone therapy   7. Long term current use of anticoagulant      PLAN:  In order of problems listed above:  1. SSS: She is not pacemaker dependent, but has nearly 100% atrial pacing. Heart rate histogram distribution is fair for her level of activity. 2. PPM: All device function. Continue modalities every 3 months and yearly office visits  3. AFib/SVT: Has not been detected in a long time. CHADS Vasc 4 (age 83, gender, HTN). 4. AV node abnormalities: She has a very lengthy AV delay and also has incomplete right bundle branch block suggesting that she has significant AV node and intraventricular conduction abnormalities. So far, with MVP programming she has not really required any ventricular pacing. 5. Hypertrophic cardiomyopathy: Without evidence of outflow tract obstruction or ventricular arrhythmia  6. Amiodarone: Had normal liver function tests and TSH roughly 6 months ago with Dr. [name]. 7. Anticoagulation: Well-tolerated without bleeding complications    Medication Adjustments/Labs and Tests Ordered: Current medicines are reviewed at length with the patient today.  Concerns regarding medicines are outlined above.  Medication changes, Labs and Tests ordered today are listed in the Patient Instructions below. Patient Instructions  Dr Sallyanne Kuster recommends that you continue on your current medications as directed. Please refer to the Current Medication list given to you today.  Remote monitoring is used to monitor your Pacemaker of ICD from home. This monitoring reduces the number of office visits required to check your device to one time per year. It allows [place] to keep an eye on the functioning of your device to ensure it is working  properly. You are scheduled for a device check from home on Monday, January 22nd, 2018. You may send your transmission at any time that day. If you have a wireless device, the transmission will be sent automatically. After your physician reviews your transmission, you will receive a postcard with your next transmission date.  Dr Sallyanne Kuster recommends that you schedule a follow-up appointment in 12 months with a device check. You will receive a reminder letter in the mail two months in advance. If you don't receive a letter, please call our office to schedule the follow-up appointment.  If you need a refill on your cardiac medications before your next appointment, please call your pharmacy.    Signed, [name], MD  01/13/2016 5:41 PM    [address], [place], [place]  [address] Phone: [phone]; Fax: [phone]

## 2016-01-13 NOTE — Patient Instructions (Signed)
Dr Sallyanne Kuster recommends that you continue on your current medications as directed. Please refer to the Current Medication list given to you today.  Remote monitoring is used to monitor your Pacemaker of ICD from home. This monitoring reduces the number of office visits required to check your device to one time per year. It allows [place] to keep an eye on the functioning of your device to ensure it is working properly. You are scheduled for a device check from home on Monday, January 22nd, 2018. You may send your transmission at any time that day. If you have a wireless device, the transmission will be sent automatically. After your physician reviews your transmission, you will receive a postcard with your next transmission date.  Dr Sallyanne Kuster recommends that you schedule a follow-up appointment in 12 months with a device check. You will receive a reminder letter in the mail two months in advance. If you don't receive a letter, please call our office to schedule the follow-up appointment.  If you need a refill on your cardiac medications before your next appointment, please call your pharmacy.

## 2016-01-16 DIAGNOSIS — M9903 Segmental and somatic dysfunction of lumbar region: Secondary | ICD-10-CM | POA: Diagnosis not present

## 2016-01-16 DIAGNOSIS — M545 Low back pain: Secondary | ICD-10-CM | POA: Diagnosis not present

## 2016-01-16 DIAGNOSIS — M9905 Segmental and somatic dysfunction of pelvic region: Secondary | ICD-10-CM | POA: Diagnosis not present

## 2016-01-16 DIAGNOSIS — M9902 Segmental and somatic dysfunction of thoracic region: Secondary | ICD-10-CM | POA: Diagnosis not present

## 2016-01-23 DIAGNOSIS — M9902 Segmental and somatic dysfunction of thoracic region: Secondary | ICD-10-CM | POA: Diagnosis not present

## 2016-01-23 DIAGNOSIS — M545 Low back pain: Secondary | ICD-10-CM | POA: Diagnosis not present

## 2016-01-23 DIAGNOSIS — M9905 Segmental and somatic dysfunction of pelvic region: Secondary | ICD-10-CM | POA: Diagnosis not present

## 2016-01-23 DIAGNOSIS — M9903 Segmental and somatic dysfunction of lumbar region: Secondary | ICD-10-CM | POA: Diagnosis not present

## 2016-01-27 ENCOUNTER — Other Ambulatory Visit: Payer: Self-pay | Admitting: Cardiovascular Disease

## 2016-01-27 NOTE — Telephone Encounter (Signed)
Rx request sent to pharmacy.  

## 2016-01-30 DIAGNOSIS — R262 Difficulty in walking, not elsewhere classified: Secondary | ICD-10-CM | POA: Diagnosis not present

## 2016-01-30 DIAGNOSIS — M6281 Muscle weakness (generalized): Secondary | ICD-10-CM | POA: Diagnosis not present

## 2016-01-30 DIAGNOSIS — Z9181 History of falling: Secondary | ICD-10-CM | POA: Diagnosis not present

## 2016-01-30 DIAGNOSIS — M545 Low back pain: Secondary | ICD-10-CM | POA: Diagnosis not present

## 2016-02-05 DIAGNOSIS — M545 Low back pain: Secondary | ICD-10-CM | POA: Diagnosis not present

## 2016-02-05 DIAGNOSIS — Z9181 History of falling: Secondary | ICD-10-CM | POA: Diagnosis not present

## 2016-02-05 DIAGNOSIS — M6281 Muscle weakness (generalized): Secondary | ICD-10-CM | POA: Diagnosis not present

## 2016-02-05 DIAGNOSIS — R262 Difficulty in walking, not elsewhere classified: Secondary | ICD-10-CM | POA: Diagnosis not present

## 2016-02-06 DIAGNOSIS — R262 Difficulty in walking, not elsewhere classified: Secondary | ICD-10-CM | POA: Diagnosis not present

## 2016-02-06 DIAGNOSIS — M6281 Muscle weakness (generalized): Secondary | ICD-10-CM | POA: Diagnosis not present

## 2016-02-06 DIAGNOSIS — M545 Low back pain: Secondary | ICD-10-CM | POA: Diagnosis not present

## 2016-02-06 DIAGNOSIS — Z9181 History of falling: Secondary | ICD-10-CM | POA: Diagnosis not present

## 2016-02-09 DIAGNOSIS — M545 Low back pain: Secondary | ICD-10-CM | POA: Diagnosis not present

## 2016-02-09 DIAGNOSIS — Z9181 History of falling: Secondary | ICD-10-CM | POA: Diagnosis not present

## 2016-02-09 DIAGNOSIS — M6281 Muscle weakness (generalized): Secondary | ICD-10-CM | POA: Diagnosis not present

## 2016-02-09 DIAGNOSIS — R262 Difficulty in walking, not elsewhere classified: Secondary | ICD-10-CM | POA: Diagnosis not present

## 2016-02-10 DIAGNOSIS — M6281 Muscle weakness (generalized): Secondary | ICD-10-CM | POA: Diagnosis not present

## 2016-02-10 DIAGNOSIS — R262 Difficulty in walking, not elsewhere classified: Secondary | ICD-10-CM | POA: Diagnosis not present

## 2016-02-10 DIAGNOSIS — Z9181 History of falling: Secondary | ICD-10-CM | POA: Diagnosis not present

## 2016-02-10 DIAGNOSIS — M545 Low back pain: Secondary | ICD-10-CM | POA: Diagnosis not present

## 2016-02-11 ENCOUNTER — Encounter: Payer: Self-pay | Admitting: Cardiovascular Disease

## 2016-02-11 ENCOUNTER — Ambulatory Visit (INDEPENDENT_AMBULATORY_CARE_PROVIDER_SITE_OTHER): Payer: Medicare Other | Admitting: Cardiovascular Disease

## 2016-02-11 VITALS — BP 118/66 | HR 60 | Ht 63.0 in | Wt 160.0 lb

## 2016-02-11 DIAGNOSIS — E785 Hyperlipidemia, unspecified: Secondary | ICD-10-CM

## 2016-02-11 DIAGNOSIS — I480 Paroxysmal atrial fibrillation: Secondary | ICD-10-CM

## 2016-02-11 DIAGNOSIS — I10 Essential (primary) hypertension: Secondary | ICD-10-CM

## 2016-02-11 DIAGNOSIS — Z7901 Long term (current) use of anticoagulants: Secondary | ICD-10-CM | POA: Diagnosis not present

## 2016-02-11 DIAGNOSIS — Z950 Presence of cardiac pacemaker: Secondary | ICD-10-CM

## 2016-02-11 NOTE — Patient Instructions (Addendum)
Your physician wants you to follow-up in: 6 MONTHS OR SOONER IF NEEDED. You will receive a reminder letter in the mail two months in advance. If you don't receive a letter, please call our office to schedule the follow-up appointment.   If you need a refill on your cardiac medications before your next appointment, please call your pharmacy.   

## 2016-02-13 NOTE — Progress Notes (Signed)
Patient ID: LAZETTE LEMLEY, female   DOB: Oct 09, 1928, 82 y.o.   MRN: [id number]     HPI: SOREN LANGWORTHY is a 82 y.o. female who presents to the office today for a 7 month followup cardiology evaluation.  Ms. Carmilita Krzywicki has a history of SVTand moderate left ventricular hypertrophy with documented "spade-like ventricle."  She has previously documented diffuse T-wave abnormalities. A stress test in May 2013 showed breast attenuation artifact with a post stress ejection fraction of 70% without evidence for scar or ischemia. She had experienced recurrent episodes of palpitations. A cardiac monitor revealed episodes of SVT up to 185 beats per minute and her beta blocker dose was increased.  A CardioNet monitor on her increased dose of Toprol-XL 25 mg still revealed bursts of atrial tachycardia/A flutter rate at 189 beats per minute.  On  May 17 she had an episode of tachycardia palpitations and did also have episodes of bradycardia; some of her spells suggested SVT at 150  in addition to episode of accelerated idioventricular rhythm when she was sinus bradycardic.  A 2-D echo Doppler study on 09/15/2012 showed an ejection fraction of 0000000 1 diastolic dysfunction and elevated LV filling pressures.  The aortic valve was as sclerotic without stenosis and there was mild/moderate central regurgitation. 10 mild mitral regurgitation and mild tricuspid regurgitation with mild pulmonary hypertension with a PA estimate pressure at 47 mm.  In April 2015, she was admitted to [facility] with atrial fibrillation with a rapid ventricular response.  She also had a CardioNet monitor which had shown episodes of sinus bradycardia, as well as PAF, with rates up to 170 beats per minute with also questionable nonsustained VT versus actual fibrillation with aberrancy.  She was started on amiodarone. Her Eliquis was held, and she underwent permanent pacemaker insertion.  She was seen in August 2015 by Dr. Sallyanne Kuster for  pacemaker follow-up.  Her underlying rhythm was sinus bradycardia at 38 bpm, and she was pacing the atrium greater than 99% of the time without hardly any episodes of ventricular pacing.  She recently saw Dr. Sallyanne Kuster for one-year evaluation in October 2016.  She has both sinus node dysfunction and AV conduction abnormalities.  Her pacemaker is programmed with MVP on and she has a very long AV conduction time so as to prevent ventricular pacing.  Since I last saw her, she underwent a pacemaker evaluation with Dr. Sallyanne Kuster was in October 2017.   She has been undergoing 3 month interval device checks.  She was atrially paced and ventricular sensing with a long AV delay.  Estimated longevity is 6.5 years.  There is not been any detected atrial fibrillation or ventricular tachycardia.   Recently, she has had difficulty with low back discomfort making it difficult to walk.  She has been undergoing physical therapy.  She also received injections and also had acupuncture.  She admits to purposeful weight loss over the last year and has lost approximately 20 pounds.  She denies any episodes of chest pain.  There have been no episodes of recurrent atrial fibrillation.  She continues to be on amiodarone 200 mg daily and takes eliquis 5 mg twice a day for anticoagulation.  She continues to be on losartan HCT and metoprolol for hypertension.   She presents for evaluation.  Past Medical History:  Diagnosis Date  . Arrhythmia    HX of SVT. CARDIONET MONITOR 07/15/12 TO 08/13/12  . Arthritis   . GERD (gastroesophageal reflux disease)   . Hyperlipidemia  08/06/11   lexiscan myoview-normal. Due to severe attenuation the study specificity and sensitivty are deminished.  . Hypertension 08/25/10   ECHO-EF 60-65%  . Hypothyroidism   . Irregular heart beat   . Shortness of breath   . Sleep apnea    SLEEP STUDY-[facility]  . Thyroid disease     Past Surgical History:  Procedure Laterality Date  .  CARDIAC CATHETERIZATION  07/06/01   spade-like configuration  . DILATION AND CURETTAGE OF UTERUS    . EYE SURGERY    . KNEE ARTHROSCOPY    . PERMANENT PACEMAKER INSERTION N/A 07/20/2013   Procedure: PERMANENT PACEMAKER INSERTION;  Surgeon: [name], MD;  Location: [facility];  Service: Cardiovascular;  Laterality: N/A;    Allergies  Allergen Reactions  . Penicillins Other (See Comments)    tingling  . Tetanus Toxoid Swelling    Current Outpatient Prescriptions  Medication Sig Dispense Refill  . acetaminophen (TYLENOL) 500 MG tablet Take 500 mg by mouth as needed (pain). For fever    . amiodarone (PACERONE) 200 MG tablet Take 1 tablet by mouth  daily 90 tablet 1  . apixaban (ELIQUIS) 5 MG TABS tablet Take 1 tablet (5 mg total) by mouth 2 (two) times daily. 180 tablet 3  . atorvastatin (LIPITOR) 20 MG tablet TAKE 1 TABLET BY MOUTH  DAILY 90 tablet 3  . Cholecalciferol (VITAMIN D) 2000 UNITS tablet Take 2,000 Units by mouth daily.    Marland Kitchen dexlansoprazole (DEXILANT) 60 MG capsule Take 60 mg by mouth daily.    Marland Kitchen docusate sodium (COLACE) 100 MG capsule Take 100 mg by mouth 2 (two) times daily.    Marland Kitchen escitalopram (LEXAPRO) 5 MG tablet Take 5 mg by mouth daily.    Marland Kitchen levothyroxine (SYNTHROID, LEVOTHROID) 75 MCG tablet Take 75 mcg by mouth daily.    Marland Kitchen losartan-hydrochlorothiazide (HYZAAR) 100-12.5 MG tablet Take 1 tablet by mouth daily. 90 tablet 1  . metoprolol (LOPRESSOR) 50 MG tablet Take 1 tablet (50 mg total) by mouth 2 (two) times daily. 180 tablet 1  . oxybutynin (DITROPAN) 5 MG tablet Take 1 tablet by mouth 2 (two) times daily. Take 1 tab twice a day    . traMADol (ULTRAM) 50 MG tablet Take 100 mg by mouth 2 (two) times daily. Take 2 tablets twice daily     No current facility-administered medications for this visit.    Social history is notable in that she is now. Her husband did suffer a CVA and has been having more difficulty at home. She has one child, 2 step sons, one  grandchild and 2 great-grandchildren. There is no tobacco or alcohol use. She does admit to frequent anxiety.  ROS General: Negative; No fevers, chills, or night sweats; positive for fatigue HEENT: Negative; No changes in vision or hearing, sinus congestion, difficulty swallowing Pulmonary: Negative; No cough, wheezing, shortness of breath, hemoptysis Cardiovascular: Negative; No chest pain, presyncope, syncope, palpitations GI: Negative; No nausea, vomiting, diarrhea, or abdominal pain GU: Negative; No dysuria, hematuria, or difficulty voiding Musculoskeletal: Positive for low back pain Hematologic/Oncology: Negative; no easy bruising, bleeding Endocrine: Negative; no heat/cold intolerance; no diabetes Neuro: Negative; no changes in balance, headaches Skin: Negative; No rashes or skin lesions Psychiatric: Negative; No behavioral problems, depression Sleep: Negative; No snoring, daytime sleepiness, hypersomnolence, bruxism, restless legs, hypnogognic hallucinations, no cataplexy Other comprehensive 14 point system review is negative.   PE BP 118/66   Pulse 60   Ht 5\' 3"  (1.6  m)   Wt 160 lb (72.6 kg)   BMI 28.34 kg/m    Repeat blood pressure by me was 124/70.  Wt Readings from Last 3 Encounters:  02/11/16 160 lb (72.6 kg)  01/13/16 161 lb 4 oz (73.1 kg)  07/18/15 171 lb (77.6 kg)  Of note, her weight in November 2016 was 181 pounds.  General: Alert, oriented, no distress.  HEENT: Normocephalic, atraumatic. Pupils round and reactive; sclera anicteric; Mouth/Parynx benign; Mallinpatti scale 2 Neck: No JVD, no carotid bruits; normal upstroke Lungs: clear to ausculatation and percussion; no wheezing or rales Chest wall: Nontender to palpation  Heart: RRR, s1 s2 normal 2/6 SEM in the aortic region. No diastolic murmur the; no s3; no rubs thrills or heaves Abdomen: soft, nontender; no hepatosplenomehaly, BS+; abdominal aorta nontender and not dilated by palpation. Mildly  obese Back: No CVA tenderness Pulses 2+ Extremities: no clubbing cyanosis or edema, Homan's sign negative  Neurologic: grossly nonfocal Psychologic: Normal affect.  ECG not done today since this was done one month ago with her pacemaker interrogation  April 2017 ECG (independently read by me): Sinus rhythm with markedly prolonged AV interval at ~ 440 msec.  Right bundle branch block  November 2016 ECG (independently read by me): 100% atrial pacing with a markedly prolonged PR interval at 480 ms.  Ventricular sensing.  Incomplete right bundle branch block.  ECG (independently read by me): Atrial paced rhythm at 86 bpm with prolonged PR segment.  T-wave inversion  December 2015 ECG (independently read by me): Atrially paced rhythm with ventricular sensing with incomplete right bundle branch block.  Previously noted T-wave abnormalities  07/24/2013 ECG: Atrial lead paced rhythm.  Mild RV conduction delay.  Diffuse T-wave inversion has been present previously  Prior ECG today  (independently read by me): Bradycardia at 48 beats per minute. RV conduction delay. Previously noted diffuse inferior and anterolateral T wave inversion  Prior ECG of 01/20/2013: sinus bradycardia at 55 beats per minute. Previously noted diffuse T-wave inversion in leads 59F, V2 through V6. Mild RV conduction delay.  LABS: I have reviewed recent blood work done by Dr. Wende Neighbors from 07/04/2015 BUN 14, Cr 0.9 , which are both improved from September 2016.  Estimated GFR 59. Cholesterol 150, triglycerides 61, HDL 65, LDL 73; improved from previous assessment.  Basic Metabolic Panel: No results for input(s): NA, K, CL, CO2, GLUCOSE, BUN, CREATININE, CALCIUM, MG, PHOS in the last 72 hours. Liver Function Tests: No results for input(s): AST, ALT, ALKPHOS, BILITOT, PROT, ALBUMIN in the last 72 hours. No results for input(s): LIPASE, AMYLASE in the last 72 hours. CBC: No results for input(s): WBC, NEUTROABS, HGB, HCT,  MCV, PLT in the last 72 hours. Cardiac Enzymes: No results for input(s): CKTOTAL, CKMB, CKMBINDEX, TROPONINI in the last 72 hours. BNP: Invalid input(s): POCBNP D-Dimer: No results for input(s): DDIMER in the last 72 hours. Hemoglobin A1C: No results for input(s): HGBA1C in the last 72 hours. Fasting Lipid Panel: No results for input(s): CHOL, HDL, LDLCALC, TRIG, CHOLHDL, LDLDIRECT in the last 72 hours. Thyroid Function Tests: No results for input(s): TSH, T4TOTAL, T3FREE, THYROIDAB in the last 72 hours.  Invalid input(s): FREET3 Anemia Panel: No results for input(s): VITAMINB12, FOLATE, FERRITIN, TIBC, IRON, RETICCTPCT in the last 72 hours.  RADIOLOGY: No results found.    ASSESSMENT AND PLAN:   Ms. Sgarlata is an 82 year old Caucasian female who has a history of SVT and documented moderate left ventricle hypertrophy the with a "spade-like ventricle."  She has diffuse T-wave abnormalities which have been present for years. In 2003 cardiac catheterization did not demonstrate coronary obstructive disease but she had mild midsystolic bridging not felt to be significant in her LAD territory.  She has a history of SVT, PAF, and had developed bradycardia arrhythmia resulting in her permanent pacemaker implantation in 2015.  She denies any episodes of chest pain, or recurrent arrhythmia.  He is unaware of any recurrent episodes of SVT, PAF, or other arrhythmia.  She has a Cha2ds2vasc score of at least 3 and is tolerating eliquis without bleeding side effects.  She continues to be on low-dose amiodarone at 200 mg daily.  She is mostly 100% atrially paced and has markedly prolonged AV conduction with a PR interval  due to MVP program being turned on.  She is not having any chest pain.  Her renal function is stable.  Her blood pressure is stable on current therapy.  She is tolerating Crestor without myalgias with her most recent LDL at 73 improved from 86.  I commended her on her weight loss.  She is no  longer obesity.  BMI is 28.34 today.  As long as she remains stable, I will see her in 6 months for reevaluation.  Time spent: 25 minutes  Troy Sine M.D., [facility] 02/13/2016 10:06 AM

## 2016-02-18 DIAGNOSIS — M6281 Muscle weakness (generalized): Secondary | ICD-10-CM | POA: Diagnosis not present

## 2016-02-18 DIAGNOSIS — Z9181 History of falling: Secondary | ICD-10-CM | POA: Diagnosis not present

## 2016-02-18 DIAGNOSIS — M545 Low back pain: Secondary | ICD-10-CM | POA: Diagnosis not present

## 2016-02-18 DIAGNOSIS — R262 Difficulty in walking, not elsewhere classified: Secondary | ICD-10-CM | POA: Diagnosis not present

## 2016-02-20 DIAGNOSIS — M545 Low back pain: Secondary | ICD-10-CM | POA: Diagnosis not present

## 2016-02-20 DIAGNOSIS — M6281 Muscle weakness (generalized): Secondary | ICD-10-CM | POA: Diagnosis not present

## 2016-02-20 DIAGNOSIS — R262 Difficulty in walking, not elsewhere classified: Secondary | ICD-10-CM | POA: Diagnosis not present

## 2016-02-20 DIAGNOSIS — Z9181 History of falling: Secondary | ICD-10-CM | POA: Diagnosis not present

## 2016-02-25 DIAGNOSIS — Z9181 History of falling: Secondary | ICD-10-CM | POA: Diagnosis not present

## 2016-02-25 DIAGNOSIS — R262 Difficulty in walking, not elsewhere classified: Secondary | ICD-10-CM | POA: Diagnosis not present

## 2016-02-25 DIAGNOSIS — M6281 Muscle weakness (generalized): Secondary | ICD-10-CM | POA: Diagnosis not present

## 2016-02-25 DIAGNOSIS — M545 Low back pain: Secondary | ICD-10-CM | POA: Diagnosis not present

## 2016-02-27 DIAGNOSIS — M545 Low back pain: Secondary | ICD-10-CM | POA: Diagnosis not present

## 2016-02-27 DIAGNOSIS — M6281 Muscle weakness (generalized): Secondary | ICD-10-CM | POA: Diagnosis not present

## 2016-02-27 DIAGNOSIS — Z9181 History of falling: Secondary | ICD-10-CM | POA: Diagnosis not present

## 2016-02-27 DIAGNOSIS — R262 Difficulty in walking, not elsewhere classified: Secondary | ICD-10-CM | POA: Diagnosis not present

## 2016-02-28 ENCOUNTER — Other Ambulatory Visit: Payer: Self-pay

## 2016-02-28 MED ORDER — LOSARTAN POTASSIUM-HCTZ 100-12.5 MG PO TABS: 1.0000 | ORAL_TABLET | Freq: Every day | ORAL | 1 refills | Status: DC

## 2016-02-28 MED ORDER — METOPROLOL TARTRATE 50 MG PO TABS: 50.0000 mg | ORAL_TABLET | Freq: Two times a day (BID) | ORAL | 1 refills | Status: DC

## 2016-02-28 MED ORDER — AMIODARONE HCL 200 MG PO TABS: 200.0000 mg | ORAL_TABLET | Freq: Every day | ORAL | 1 refills | Status: DC

## 2016-03-03 DIAGNOSIS — Z9181 History of falling: Secondary | ICD-10-CM | POA: Diagnosis not present

## 2016-03-03 DIAGNOSIS — M545 Low back pain: Secondary | ICD-10-CM | POA: Diagnosis not present

## 2016-03-03 DIAGNOSIS — R262 Difficulty in walking, not elsewhere classified: Secondary | ICD-10-CM | POA: Diagnosis not present

## 2016-03-03 DIAGNOSIS — M6281 Muscle weakness (generalized): Secondary | ICD-10-CM | POA: Diagnosis not present

## 2016-03-05 DIAGNOSIS — Z9181 History of falling: Secondary | ICD-10-CM | POA: Diagnosis not present

## 2016-03-05 DIAGNOSIS — M6281 Muscle weakness (generalized): Secondary | ICD-10-CM | POA: Diagnosis not present

## 2016-03-05 DIAGNOSIS — R262 Difficulty in walking, not elsewhere classified: Secondary | ICD-10-CM | POA: Diagnosis not present

## 2016-03-05 DIAGNOSIS — M545 Low back pain: Secondary | ICD-10-CM | POA: Diagnosis not present

## 2016-03-10 DIAGNOSIS — M6281 Muscle weakness (generalized): Secondary | ICD-10-CM | POA: Diagnosis not present

## 2016-03-10 DIAGNOSIS — Z9181 History of falling: Secondary | ICD-10-CM | POA: Diagnosis not present

## 2016-03-10 DIAGNOSIS — R262 Difficulty in walking, not elsewhere classified: Secondary | ICD-10-CM | POA: Diagnosis not present

## 2016-03-10 DIAGNOSIS — M545 Low back pain: Secondary | ICD-10-CM | POA: Diagnosis not present

## 2016-03-12 DIAGNOSIS — R262 Difficulty in walking, not elsewhere classified: Secondary | ICD-10-CM | POA: Diagnosis not present

## 2016-03-12 DIAGNOSIS — Z9181 History of falling: Secondary | ICD-10-CM | POA: Diagnosis not present

## 2016-03-12 DIAGNOSIS — M545 Low back pain: Secondary | ICD-10-CM | POA: Diagnosis not present

## 2016-03-12 DIAGNOSIS — M6281 Muscle weakness (generalized): Secondary | ICD-10-CM | POA: Diagnosis not present

## 2016-03-17 DIAGNOSIS — R262 Difficulty in walking, not elsewhere classified: Secondary | ICD-10-CM | POA: Diagnosis not present

## 2016-03-17 DIAGNOSIS — M545 Low back pain: Secondary | ICD-10-CM | POA: Diagnosis not present

## 2016-03-17 DIAGNOSIS — Z9181 History of falling: Secondary | ICD-10-CM | POA: Diagnosis not present

## 2016-03-17 DIAGNOSIS — M6281 Muscle weakness (generalized): Secondary | ICD-10-CM | POA: Diagnosis not present

## 2016-03-19 DIAGNOSIS — R262 Difficulty in walking, not elsewhere classified: Secondary | ICD-10-CM | POA: Diagnosis not present

## 2016-03-19 DIAGNOSIS — M545 Low back pain: Secondary | ICD-10-CM | POA: Diagnosis not present

## 2016-03-19 DIAGNOSIS — Z9181 History of falling: Secondary | ICD-10-CM | POA: Diagnosis not present

## 2016-03-19 DIAGNOSIS — M6281 Muscle weakness (generalized): Secondary | ICD-10-CM | POA: Diagnosis not present

## 2016-03-20 DIAGNOSIS — Z0000 Encounter for general adult medical examination without abnormal findings: Secondary | ICD-10-CM | POA: Diagnosis not present

## 2016-03-26 DIAGNOSIS — Z9181 History of falling: Secondary | ICD-10-CM | POA: Diagnosis not present

## 2016-03-26 DIAGNOSIS — R262 Difficulty in walking, not elsewhere classified: Secondary | ICD-10-CM | POA: Diagnosis not present

## 2016-03-26 DIAGNOSIS — M545 Low back pain: Secondary | ICD-10-CM | POA: Diagnosis not present

## 2016-03-26 DIAGNOSIS — M6281 Muscle weakness (generalized): Secondary | ICD-10-CM | POA: Diagnosis not present

## 2016-03-31 DIAGNOSIS — R262 Difficulty in walking, not elsewhere classified: Secondary | ICD-10-CM | POA: Diagnosis not present

## 2016-03-31 DIAGNOSIS — M545 Low back pain: Secondary | ICD-10-CM | POA: Diagnosis not present

## 2016-03-31 DIAGNOSIS — M6281 Muscle weakness (generalized): Secondary | ICD-10-CM | POA: Diagnosis not present

## 2016-03-31 DIAGNOSIS — Z9181 History of falling: Secondary | ICD-10-CM | POA: Diagnosis not present

## 2016-04-02 DIAGNOSIS — R262 Difficulty in walking, not elsewhere classified: Secondary | ICD-10-CM | POA: Diagnosis not present

## 2016-04-02 DIAGNOSIS — M545 Low back pain: Secondary | ICD-10-CM | POA: Diagnosis not present

## 2016-04-02 DIAGNOSIS — Z9181 History of falling: Secondary | ICD-10-CM | POA: Diagnosis not present

## 2016-04-02 DIAGNOSIS — M6281 Muscle weakness (generalized): Secondary | ICD-10-CM | POA: Diagnosis not present

## 2016-04-07 DIAGNOSIS — M545 Low back pain: Secondary | ICD-10-CM | POA: Diagnosis not present

## 2016-04-07 DIAGNOSIS — Z9181 History of falling: Secondary | ICD-10-CM | POA: Diagnosis not present

## 2016-04-07 DIAGNOSIS — R262 Difficulty in walking, not elsewhere classified: Secondary | ICD-10-CM | POA: Diagnosis not present

## 2016-04-07 DIAGNOSIS — M6281 Muscle weakness (generalized): Secondary | ICD-10-CM | POA: Diagnosis not present

## 2016-04-13 ENCOUNTER — Telehealth: Payer: Self-pay | Admitting: Cardiology

## 2016-04-13 ENCOUNTER — Ambulatory Visit (INDEPENDENT_AMBULATORY_CARE_PROVIDER_SITE_OTHER): Payer: Medicare Other | Admitting: *Deleted

## 2016-04-13 DIAGNOSIS — I495 Sick sinus syndrome: Secondary | ICD-10-CM | POA: Diagnosis not present

## 2016-04-13 NOTE — Telephone Encounter (Signed)
Spoke with pt and reminded pt of remote transmission that is due today. Pt verbalized understanding.   

## 2016-04-14 DIAGNOSIS — M6281 Muscle weakness (generalized): Secondary | ICD-10-CM | POA: Diagnosis not present

## 2016-04-14 DIAGNOSIS — Z9181 History of falling: Secondary | ICD-10-CM | POA: Diagnosis not present

## 2016-04-14 DIAGNOSIS — M545 Low back pain: Secondary | ICD-10-CM | POA: Diagnosis not present

## 2016-04-14 DIAGNOSIS — R262 Difficulty in walking, not elsewhere classified: Secondary | ICD-10-CM | POA: Diagnosis not present

## 2016-04-14 LAB — CUP PACEART REMOTE DEVICE CHECK
Battery Voltage: 3.01 V
Brady Statistic AP VP Percent: 0.27 %
Brady Statistic AP VS Percent: 99.71 %
Brady Statistic AS VP Percent: 0 %
Brady Statistic AS VS Percent: 0.02 %
Brady Statistic RV Percent Paced: 0.27 %
Implantable Lead Implant Date: 20150430
Implantable Lead Location: 753859
Implantable Lead Model: 5076
Implantable Pulse Generator Implant Date: 20150430
Lead Channel Impedance Value: 399 Ohm
Lead Channel Impedance Value: 513 Ohm
Lead Channel Impedance Value: 532 Ohm
Lead Channel Impedance Value: 608 Ohm
Lead Channel Pacing Threshold Amplitude: 0.875 V
Lead Channel Pacing Threshold Amplitude: 1 V
Lead Channel Pacing Threshold Pulse Width: 0.4 ms
Lead Channel Sensing Intrinsic Amplitude: 31.625 mV
Lead Channel Setting Pacing Amplitude: 2.25 V
Lead Channel Setting Pacing Amplitude: 2.5 V
Lead Channel Setting Sensing Sensitivity: 4 mV
MDC IDC LEAD IMPLANT DT: 20150430
MDC IDC LEAD LOCATION: 753860
MDC IDC MSMT BATTERY REMAINING LONGEVITY: 85 mo
MDC IDC MSMT LEADCHNL RA PACING THRESHOLD PULSEWIDTH: 0.4 ms
MDC IDC MSMT LEADCHNL RA SENSING INTR AMPL: 1.75 mV
MDC IDC MSMT LEADCHNL RA SENSING INTR AMPL: 1.75 mV
MDC IDC MSMT LEADCHNL RV SENSING INTR AMPL: 31.625 mV
MDC IDC SESS DTM: 20180122210732
MDC IDC SET LEADCHNL RV PACING PULSEWIDTH: 0.4 ms
MDC IDC STAT BRADY RA PERCENT PACED: 99.98 %

## 2016-04-14 NOTE — Progress Notes (Signed)
Remote pacemaker transmission.   

## 2016-04-15 ENCOUNTER — Encounter: Payer: Self-pay | Admitting: Cardiology

## 2016-04-16 DIAGNOSIS — M545 Low back pain: Secondary | ICD-10-CM | POA: Diagnosis not present

## 2016-04-16 DIAGNOSIS — Z9181 History of falling: Secondary | ICD-10-CM | POA: Diagnosis not present

## 2016-04-16 DIAGNOSIS — M6281 Muscle weakness (generalized): Secondary | ICD-10-CM | POA: Diagnosis not present

## 2016-04-16 DIAGNOSIS — R262 Difficulty in walking, not elsewhere classified: Secondary | ICD-10-CM | POA: Diagnosis not present

## 2016-04-21 DIAGNOSIS — R079 Chest pain, unspecified: Secondary | ICD-10-CM | POA: Diagnosis not present

## 2016-04-21 DIAGNOSIS — M1991 Primary osteoarthritis, unspecified site: Secondary | ICD-10-CM | POA: Diagnosis not present

## 2016-07-13 ENCOUNTER — Other Ambulatory Visit: Payer: Self-pay | Admitting: Cardiovascular Disease

## 2016-07-14 ENCOUNTER — Ambulatory Visit (INDEPENDENT_AMBULATORY_CARE_PROVIDER_SITE_OTHER): Payer: Medicare Other | Admitting: *Deleted

## 2016-07-14 DIAGNOSIS — I495 Sick sinus syndrome: Secondary | ICD-10-CM | POA: Diagnosis not present

## 2016-07-15 ENCOUNTER — Telehealth: Payer: Self-pay | Admitting: Cardiology

## 2016-07-15 ENCOUNTER — Telehealth: Payer: Self-pay | Admitting: Cardiovascular Disease

## 2016-07-15 NOTE — Telephone Encounter (Signed)
Denise Pugh receiving error code 3230. She does not leave her monitor plugged up at all times. I have advised her to leave it plugged up and attempt the transmission tomorrow once the monitor has charged sufficiently. She verbalizes understanding.

## 2016-07-15 NOTE — Telephone Encounter (Signed)
New Message  Pt voiced needing someone in device clinic to call her about her transmission, needing help

## 2016-07-15 NOTE — Telephone Encounter (Signed)
Spoke with pt and reminded pt of remote transmission that is due today. Pt verbalized understanding.   

## 2016-08-04 ENCOUNTER — Encounter: Payer: Self-pay | Admitting: Cardiology

## 2016-08-04 LAB — CUP PACEART REMOTE DEVICE CHECK
Battery Voltage: 3.01 V
Brady Statistic AS VP Percent: 0 %
Brady Statistic AS VS Percent: 0.01 %
Brady Statistic RA Percent Paced: 99.98 %
Implantable Lead Implant Date: 20150430
Implantable Lead Location: 753860
Implantable Lead Model: 5076
Lead Channel Impedance Value: 361 Ohm
Lead Channel Pacing Threshold Amplitude: 1.125 V
Lead Channel Pacing Threshold Pulse Width: 0.4 ms
Lead Channel Pacing Threshold Pulse Width: 0.4 ms
Lead Channel Sensing Intrinsic Amplitude: 1.125 mV
Lead Channel Sensing Intrinsic Amplitude: 30.875 mV
Lead Channel Setting Pacing Pulse Width: 0.4 ms
MDC IDC LEAD IMPLANT DT: 20150430
MDC IDC LEAD LOCATION: 753859
MDC IDC MSMT BATTERY REMAINING LONGEVITY: 83 mo
MDC IDC MSMT LEADCHNL RA IMPEDANCE VALUE: 437 Ohm
MDC IDC MSMT LEADCHNL RA SENSING INTR AMPL: 1.125 mV
MDC IDC MSMT LEADCHNL RV IMPEDANCE VALUE: 513 Ohm
MDC IDC MSMT LEADCHNL RV IMPEDANCE VALUE: 570 Ohm
MDC IDC MSMT LEADCHNL RV PACING THRESHOLD AMPLITUDE: 0.75 V
MDC IDC MSMT LEADCHNL RV SENSING INTR AMPL: 30.875 mV
MDC IDC PG IMPLANT DT: 20150430
MDC IDC SESS DTM: 20180426201114
MDC IDC SET LEADCHNL RA PACING AMPLITUDE: 2.25 V
MDC IDC SET LEADCHNL RV PACING AMPLITUDE: 2.5 V
MDC IDC SET LEADCHNL RV SENSING SENSITIVITY: 4 mV
MDC IDC STAT BRADY AP VP PERCENT: 0.27 %
MDC IDC STAT BRADY AP VS PERCENT: 99.72 %
MDC IDC STAT BRADY RV PERCENT PACED: 0.27 %

## 2016-08-04 NOTE — Progress Notes (Signed)
Remote pacemaker transmission.   

## 2016-08-18 DIAGNOSIS — D509 Iron deficiency anemia, unspecified: Secondary | ICD-10-CM | POA: Diagnosis not present

## 2016-08-18 DIAGNOSIS — E039 Hypothyroidism, unspecified: Secondary | ICD-10-CM | POA: Diagnosis not present

## 2016-08-18 DIAGNOSIS — I10 Essential (primary) hypertension: Secondary | ICD-10-CM | POA: Diagnosis not present

## 2016-08-20 DIAGNOSIS — I482 Chronic atrial fibrillation: Secondary | ICD-10-CM | POA: Diagnosis not present

## 2016-08-20 DIAGNOSIS — I2510 Atherosclerotic heart disease of native coronary artery without angina pectoris: Secondary | ICD-10-CM | POA: Diagnosis not present

## 2016-08-20 DIAGNOSIS — E039 Hypothyroidism, unspecified: Secondary | ICD-10-CM | POA: Diagnosis not present

## 2016-09-22 DIAGNOSIS — D509 Iron deficiency anemia, unspecified: Secondary | ICD-10-CM | POA: Diagnosis not present

## 2016-09-22 DIAGNOSIS — R195 Other fecal abnormalities: Secondary | ICD-10-CM | POA: Diagnosis not present

## 2016-09-22 DIAGNOSIS — R252 Cramp and spasm: Secondary | ICD-10-CM | POA: Diagnosis not present

## 2016-09-24 ENCOUNTER — Telehealth: Payer: Self-pay | Admitting: Cardiovascular Disease

## 2016-09-24 ENCOUNTER — Other Ambulatory Visit: Payer: Self-pay | Admitting: Cardiovascular Disease

## 2016-09-24 NOTE — Telephone Encounter (Signed)
°*  STAT* If patient is at the pharmacy, call can be transferred to refill team.   1. Which medications need to be refilled? (please list name of each medication and dose if known) lipitor 20mg  2. Which pharmacy/location (including street and city if local pharmacy) is medication to be sent to? [facility], [address], [place], [address] 3. Do they need a 30 day or 90 day supply? [place]

## 2016-09-24 NOTE — Telephone Encounter (Signed)
Spoke with patient and she states she does not need a rx for Atorvastatin because  has to much. I was able to make appointment for patient to see Dr Claiborne Billings. As noted from AVS from last visit.

## 2016-10-01 ENCOUNTER — Other Ambulatory Visit: Payer: Self-pay | Admitting: Cardiovascular Disease

## 2016-10-01 NOTE — Telephone Encounter (Signed)
Rx(s) sent to pharmacy electronically.  

## 2016-11-02 DIAGNOSIS — D509 Iron deficiency anemia, unspecified: Secondary | ICD-10-CM | POA: Diagnosis not present

## 2016-11-03 ENCOUNTER — Ambulatory Visit (INDEPENDENT_AMBULATORY_CARE_PROVIDER_SITE_OTHER): Payer: Medicare Other | Admitting: *Deleted

## 2016-11-03 DIAGNOSIS — I495 Sick sinus syndrome: Secondary | ICD-10-CM

## 2016-11-03 DIAGNOSIS — I480 Paroxysmal atrial fibrillation: Secondary | ICD-10-CM

## 2016-11-04 LAB — CUP PACEART REMOTE DEVICE CHECK
Brady Statistic AP VP Percent: 0.35 %
Brady Statistic AS VP Percent: 0 %
Brady Statistic RA Percent Paced: 99.98 %
Implantable Lead Implant Date: 20150430
Implantable Lead Implant Date: 20150430
Implantable Lead Location: 753859
Implantable Lead Model: 5076
Implantable Lead Model: 5076
Lead Channel Impedance Value: 475 Ohm
Lead Channel Impedance Value: 551 Ohm
Lead Channel Pacing Threshold Pulse Width: 0.4 ms
Lead Channel Sensing Intrinsic Amplitude: 31.625 mV
Lead Channel Setting Pacing Amplitude: 2.25 V
Lead Channel Setting Pacing Amplitude: 2.5 V
Lead Channel Setting Sensing Sensitivity: 4 mV
MDC IDC LEAD LOCATION: 753860
MDC IDC MSMT BATTERY REMAINING LONGEVITY: 82 mo
MDC IDC MSMT BATTERY VOLTAGE: 3.01 V
MDC IDC MSMT LEADCHNL RA IMPEDANCE VALUE: 380 Ohm
MDC IDC MSMT LEADCHNL RA PACING THRESHOLD AMPLITUDE: 1.125 V
MDC IDC MSMT LEADCHNL RA SENSING INTR AMPL: 1.25 mV
MDC IDC MSMT LEADCHNL RA SENSING INTR AMPL: 1.25 mV
MDC IDC MSMT LEADCHNL RV IMPEDANCE VALUE: 608 Ohm
MDC IDC MSMT LEADCHNL RV PACING THRESHOLD AMPLITUDE: 0.75 V
MDC IDC MSMT LEADCHNL RV PACING THRESHOLD PULSEWIDTH: 0.4 ms
MDC IDC MSMT LEADCHNL RV SENSING INTR AMPL: 31.625 mV
MDC IDC PG IMPLANT DT: 20150430
MDC IDC SESS DTM: 20180814200544
MDC IDC SET LEADCHNL RV PACING PULSEWIDTH: 0.4 ms
MDC IDC STAT BRADY AP VS PERCENT: 99.63 %
MDC IDC STAT BRADY AS VS PERCENT: 0.02 %
MDC IDC STAT BRADY RV PERCENT PACED: 0.35 %

## 2016-11-04 NOTE — Progress Notes (Signed)
Remote pacemaker check. 

## 2016-11-06 ENCOUNTER — Other Ambulatory Visit: Payer: Self-pay | Admitting: Pharmacist

## 2016-11-06 MED ORDER — APIXABAN 5 MG PO TABS: 5.0000 mg | ORAL_TABLET | Freq: Two times a day (BID) | ORAL | 0 refills | Status: DC

## 2016-11-06 NOTE — Telephone Encounter (Signed)
Recent lab work requested to Dr [name] office.  Refill auth until next cardiologist f/u visit

## 2016-11-17 ENCOUNTER — Encounter: Payer: Self-pay | Admitting: Cardiology

## 2016-12-04 ENCOUNTER — Other Ambulatory Visit: Payer: Self-pay | Admitting: Cardiovascular Disease

## 2016-12-23 ENCOUNTER — Ambulatory Visit (INDEPENDENT_AMBULATORY_CARE_PROVIDER_SITE_OTHER): Payer: Medicare Other | Admitting: Cardiovascular Disease

## 2016-12-23 ENCOUNTER — Ambulatory Visit: Payer: Self-pay

## 2016-12-23 ENCOUNTER — Encounter: Payer: Self-pay | Admitting: Cardiovascular Disease

## 2016-12-23 VITALS — BP 158/78 | HR 68 | Ht 63.0 in | Wt 166.8 lb

## 2016-12-23 DIAGNOSIS — I495 Sick sinus syndrome: Secondary | ICD-10-CM | POA: Diagnosis not present

## 2016-12-23 DIAGNOSIS — Z950 Presence of cardiac pacemaker: Secondary | ICD-10-CM | POA: Diagnosis not present

## 2016-12-23 DIAGNOSIS — I10 Essential (primary) hypertension: Secondary | ICD-10-CM

## 2016-12-23 DIAGNOSIS — I480 Paroxysmal atrial fibrillation: Secondary | ICD-10-CM | POA: Diagnosis not present

## 2016-12-23 DIAGNOSIS — I358 Other nonrheumatic aortic valve disorders: Secondary | ICD-10-CM

## 2016-12-23 DIAGNOSIS — I440 Atrioventricular block, first degree: Secondary | ICD-10-CM | POA: Diagnosis not present

## 2016-12-23 DIAGNOSIS — Z7901 Long term (current) use of anticoagulants: Secondary | ICD-10-CM | POA: Diagnosis not present

## 2016-12-23 NOTE — Patient Instructions (Signed)
Medication Instructions:  Your physician recommends that you continue on your current medications as directed. Please refer to the Current Medication list given to you today.  Testing/Procedures: Your physician has requested that you have an echocardiogram in April 2019. Echocardiography is a painless test that uses sound waves to create images of your heart. It provides your doctor with information about the size and shape of your heart and how well your heart's chambers and valves are working. This procedure takes approximately one hour. There are no restrictions for this procedure.  This will be done at our [facility] location:  [facility] wants you to follow-up in: 6 MONTHS (after echo) with [name]. You will receive a reminder letter in the mail two months in advance. If you don't receive a letter, please call our office to schedule the follow-up appointment.   Any Other Special Instructions Will Be Listed Below (If Applicable).     If you need a refill on your cardiac medications before your next appointment, please call your pharmacy.

## 2016-12-23 NOTE — Progress Notes (Signed)
Patient ID: Denise Pugh, female   DOB: Dec 15, 1928, 83 y.o.   MRN: [id number]     HPI: Denise Pugh is a 83 y.o. female who presents to the office today for an 66 month followup cardiology evaluation.  Denise Pugh has a history of SVTand moderate left ventricular hypertrophy with documented "spade-like ventricle."  She has previously documented diffuse T-wave abnormalities. A stress test in May 2013 showed breast attenuation artifact with a post stress ejection fraction of 70% without evidence for scar or ischemia. She had experienced recurrent episodes of palpitations. A cardiac monitor revealed episodes of SVT up to 185 beats per minute and her beta blocker dose was increased.  A CardioNet monitor on her increased dose of Toprol-XL 25 mg still revealed bursts of atrial tachycardia/A flutter rate at 189 beats per minute.  On  May 17 she had an episode of tachycardia palpitations and did also have episodes of bradycardia; some of her spells suggested SVT at 150  in addition to episode of accelerated idioventricular rhythm when she was sinus bradycardic.  A 2-D echo Doppler study on 09/15/2012 showed an ejection fraction of 10-27%,OZDGU 1 diastolic dysfunction and elevated LV filling pressures.  The aortic valve was as sclerotic without stenosis and there was mild/moderate central regurgitation. 10 mild mitral regurgitation and mild tricuspid regurgitation with mild pulmonary hypertension with a PA estimate pressure at 47 mm.  In April 2015, she was admitted to [facility] with atrial fibrillation with a rapid ventricular response.  She also had a CardioNet monitor which had shown episodes of sinus bradycardia, as well as PAF, with rates up to 170 beats per minute with also questionable nonsustained VT versus actual fibrillation with aberrancy.  She was started on amiodarone. Her Eliquis was held, and she underwent permanent pacemaker insertion.  She was seen in August 2015 by Dr. Sallyanne Kuster for  pacemaker follow-up.  Her underlying rhythm was sinus bradycardia at 38 bpm, and she was pacing the atrium greater than 99% of the time without hardly any episodes of ventricular pacing.  She recently saw Dr. Sallyanne Kuster for one-year evaluation in October 2016.  She has both sinus node dysfunction and AV conduction abnormalities.  Her pacemaker is programmed with MVP on and she has a very long AV conduction time so as to prevent ventricular pacing.  Since I last saw her, she underwent a pacemaker evaluation with Dr. Sallyanne Kuster was in October 2017.   She has been undergoing 3 month interval device checks.  She was atrially paced and ventricular sensing with a long AV delay.  Estimated longevity is 6.5 years.  There is not been any detected atrial fibrillation or ventricular tachycardia.   Se has had difficulty with low back discomfort making it difficult to walk.  She has been undergoing physical therapy.  She also received injections and also had acupuncture.  She admits to purposeful weight loss over the last year and has lost approximately 20 pounds.  She denies any episodes of chest pain.  There have been no episodes of recurrent atrial fibrillation.  She continues to be on amiodarone 200 mg daily and takes eliquis 5 mg twice a day for anticoagulation.  She continues to be on losartan HCT and metoprolol for hypertension.    Since I last saw her, she has been bothered by osteoarthritis.  She denies any chest pain, palpitations or dyspnea.  She denies presyncope or syncope.  She has  had difficulty with low iron.  She recently had  laboratory in August 2018 and hemoglobin was 13.8, hematocrit 41.7.  Platelets 225.  She is not yet had her pacemaker follow-up with Dr. Sallyanne Kuster.  She presents for evaluation.    Past Medical History:  Diagnosis Date  . Arrhythmia    HX of SVT. CARDIONET MONITOR 07/15/12 TO 08/13/12  . Arthritis   . GERD (gastroesophageal reflux disease)   . Hyperlipidemia 08/06/11   lexiscan  myoview-normal. Due to severe attenuation the study specificity and sensitivty are deminished.  . Hypertension 08/25/10   ECHO-EF 60-65%  . Hypothyroidism   . Irregular heart beat   . Shortness of breath   . Sleep apnea    SLEEP STUDY-[facility]  . Thyroid disease     Past Surgical History:  Procedure Laterality Date  . CARDIAC CATHETERIZATION  07/06/01   spade-like configuration  . DILATION AND CURETTAGE OF UTERUS    . EYE SURGERY    . KNEE ARTHROSCOPY    . PERMANENT PACEMAKER INSERTION N/A 07/20/2013   Procedure: PERMANENT PACEMAKER INSERTION;  Surgeon: [name], MD;  Location: [facility] CATH [facility];  Service: Cardiovascular;  Laterality: N/A;    Allergies  Allergen Reactions  . Penicillins Other (See Comments)    tingling  . Tetanus Toxoid Swelling    Current Outpatient Prescriptions  Medication Sig Dispense Refill  . acetaminophen (TYLENOL) 500 MG tablet Take 500 mg by mouth as needed (pain). For fever    . amiodarone (PACERONE) 200 MG tablet TAKE 1 TABLET BY MOUTH  DAILY 90 tablet 3  . atorvastatin (LIPITOR) 20 MG tablet TAKE 1/2 TABLET BY MOUTH ONCE DAILY 15 tablet 4  . Cholecalciferol (VITAMIN D) 2000 UNITS tablet Take 2,000 Units by mouth daily.    Marland Kitchen dexlansoprazole (DEXILANT) 60 MG capsule Take 60 mg by mouth daily.    Marland Kitchen docusate sodium (COLACE) 100 MG capsule Take 100 mg by mouth 2 (two) times daily.    Marland Kitchen ELIQUIS 5 MG TABS tablet TAKE 1 TABLET BY MOUTH TWO  TIMES DAILY 120 tablet 0  . escitalopram (LEXAPRO) 5 MG tablet Take 5 mg by mouth daily.    Marland Kitchen levothyroxine (SYNTHROID, LEVOTHROID) 75 MCG tablet Take 75 mcg by mouth daily.    Marland Kitchen losartan-hydrochlorothiazide (HYZAAR) 100-12.5 MG tablet TAKE 1 TABLET BY MOUTH  DAILY 90 tablet 3  . metoprolol (LOPRESSOR) 50 MG tablet TAKE 1 TABLET BY MOUTH TWO  TIMES DAILY 180 tablet 3  . oxybutynin (DITROPAN) 5 MG tablet Take 1 tablet by mouth 2 (two) times daily. Take 1 tab twice a day    . traMADol (ULTRAM) 50  MG tablet Take 100 mg by mouth 2 (two) times daily. Take 2 tablets twice daily     No current facility-administered medications for this visit.    Social history is notable in that she is now. Her husband did suffer a CVA and has been having more difficulty at home. She has one child, 2 step sons, one grandchild and 2 great-grandchildren. There is no tobacco or alcohol use. She does admit to frequent anxiety.  ROS General: Negative; No fevers, chills, or night sweats; positive for fatigue HEENT: Negative; No changes in vision or hearing, sinus congestion, difficulty swallowing Pulmonary: Negative; No cough, wheezing, shortness of breath, hemoptysis Cardiovascular: Negative; No chest pain, presyncope, syncope, palpitations GI: Negative; No nausea, vomiting, diarrhea, or abdominal pain GU: Negative; No dysuria, hematuria, or difficulty voiding Musculoskeletal: Positive for low back pain Hematologic/Oncology: Negative; no easy bruising, bleeding Endocrine: Negative; no  heat/cold intolerance; no diabetes Neuro: Negative; no changes in balance, headaches Skin: Negative; No rashes or skin lesions Psychiatric: Negative; No behavioral problems, depression Sleep: Negative; No snoring, daytime sleepiness, hypersomnolence, bruxism, restless legs, hypnogognic hallucinations, no cataplexy Other comprehensive 14 point system review is negative.   PE BP (!) 158/78   Pulse 68   Ht 5\' 3"  (1.6 m)   Wt 166 lb 12.8 oz (75.7 kg)   BMI 29.55 kg/m    Repeat blood pressure by me was 110/72.  Wt Readings from Last 3 Encounters:  12/23/16 166 lb 12.8 oz (75.7 kg)  02/11/16 160 lb (72.6 kg)  01/13/16 161 lb 4 oz (73.1 kg)  November 2016:  Weight 181 pounds.  General: Alert, oriented, no distress.  Skin: normal turgor, no rashes, warm and dry HEENT: Normocephalic, atraumatic. Pupils equal round and reactive to light; sclera anicteric; extraocular muscles intact;  Nose without nasal septal  hypertrophy Mouth/Parynx benign; Mallinpatti scale 2 Neck: No JVD, no carotid bruits; normal carotid upstroke Lungs: clear to ausculatation and percussion; no wheezing or rales Chest wall: without tenderness to palpitation Heart: PMI not displaced, RRR, s1 s2 normal, 2/6 systolic murmur in the aortic area and along the left sternal border,, no diastolic murmur, no rubs, gallops, thrills, or heaves Abdomen: soft, nontender; no hepatosplenomehaly, BS+; abdominal aorta nontender and not dilated by palpation. Back: no CVA tenderness Pulses 2+ Musculoskeletal: full range of motion, normal strength, no joint deformities Extremities: no clubbing cyanosis or edema, Homan's sign negative  Neurologic: grossly nonfocal; Cranial nerves grossly wnl Psychologic: Normal mood and affect   ECG (independently read by me): Atrial paced rhythm at 68 bpm.  Prolonged AV conduction at 300 ms. ,  Incomplete right bundle-branch block Anterolateral ST-T changes  April 2017 ECG (independently read by me): Sinus rhythm with markedly prolonged AV interval at ~ 440 msec.  Right bundle branch block  November 2016 ECG (independently read by me): 100% atrial pacing with a markedly prolonged PR interval at 480 ms.  Ventricular sensing.  Incomplete right bundle branch block.  ECG (independently read by me): Atrial paced rhythm at 86 bpm with prolonged PR segment.  T-wave inversion  December 2015 ECG (independently read by me): Atrially paced rhythm with ventricular sensing with incomplete right bundle branch block.  Previously noted T-wave abnormalities  07/24/2013 ECG: Atrial lead paced rhythm.  Mild RV conduction delay.  Diffuse T-wave inversion has been present previously  Prior ECG today  (independently read by me): Bradycardia at 48 beats per minute. RV conduction delay. Previously noted diffuse inferior and anterolateral T wave inversion  Prior ECG of 01/20/2013: sinus bradycardia at 55 beats per minute. Previously  noted diffuse T-wave inversion in leads 83F, V2 through V6. Mild RV conduction delay.  LABS: I have reviewed recent blood work done by Dr. Wende Neighbors from 07/04/2015 BUN 14, Cr 0.9 , which are both improved from September 2016.  Estimated GFR 59. Cholesterol 150, triglycerides 61, HDL 65, LDL 73; improved from previous assessment.  Recent lab work from 11/02/2016 was reviewed as noted above.    Basic Metabolic Panel: No results for input(s): NA, K, CL, CO2, GLUCOSE, BUN, CREATININE, CALCIUM, MG, PHOS in the last 72 hours. Liver Function Tests: No results for input(s): AST, ALT, ALKPHOS, BILITOT, PROT, ALBUMIN in the last 72 hours. No results for input(s): LIPASE, AMYLASE in the last 72 hours. CBC: No results for input(s): WBC, NEUTROABS, HGB, HCT, MCV, PLT in the last 72 hours. Cardiac Enzymes: No  results for input(s): CKTOTAL, CKMB, CKMBINDEX, TROPONINI in the last 72 hours. BNP: Invalid input(s): POCBNP D-Dimer: No results for input(s): DDIMER in the last 72 hours. Hemoglobin A1C: No results for input(s): HGBA1C in the last 72 hours. Fasting Lipid Panel: No results for input(s): CHOL, HDL, LDLCALC, TRIG, CHOLHDL, LDLDIRECT in the last 72 hours. Thyroid Function Tests: No results for input(s): TSH, T4TOTAL, T3FREE, THYROIDAB in the last 72 hours.  Invalid input(s): FREET3 Anemia Panel: No results for input(s): VITAMINB12, FOLATE, FERRITIN, TIBC, IRON, RETICCTPCT in the last 72 hours.  RADIOLOGY: No results found.  IMPRESSION:  1. Aortic heart murmur   2. Paroxysmal atrial fibrillation (HCC)   3. SSS (sick sinus syndrome) ([place])   4. Essential hypertension   5. First degree AV block   6. Long term current use of anticoagulant   7. Pacemaker     ASSESSMENT AND PLAN:   Ms. Kellett is an 83 year old Caucasian female who has a history of SVT and documented moderate left ventricle hypertrophy the with a "spade-like ventricle."  She has diffuse T-wave abnormalities which have  been present for years. In 2003 cardiac catheterization did not demonstrate coronary obstructive disease but she had mild midsystolic bridging not felt to be significant in her LAD territory.  She has a history of SVT, PAF, and had developed bradycardia arrhythmia resulting in her permanent pacemaker implantation in 2015.  Presently, she is atrially paced and has prolonged AV conduction due to MVP program being turned on.   She denies any episodes of chest pain, or recurrent arrhythmia. She is unaware of any recurrent episodes of SVT, PAF, or other arrhythmia.  Her Cha2ds2vasc score is at least 3 and she is tolerating  eliquis without bleeding side effects.  Her blood pressure today is stable on losartan HCT 100/12.5, metoprolol 50 mg twice a day.  She continues to be on atorvastatin at only 10 mg for hyperlipidemia.  She is on levothyroxine for hypothyroidism.  Her GERD is controlled with dexilant.  I recommended a pacemaker evaluation several months with Dr. Sallyanne Kuster.  I will see her in 6 months for follow-up CardioNet oligemia evaluation.  Prior to that 6 month office visit. she will undergo a echo Doppler study to reassess her aortic cardiac murmur.    Time spent: 25 minutes  Troy Sine M.D., [facility] 12/25/2016 3:56 PM

## 2017-01-07 ENCOUNTER — Other Ambulatory Visit: Payer: Self-pay | Admitting: Pharmacist

## 2017-01-07 MED ORDER — APIXABAN 5 MG PO TABS: 5.0000 mg | ORAL_TABLET | Freq: Two times a day (BID) | ORAL | 1 refills | Status: DC

## 2017-02-02 ENCOUNTER — Ambulatory Visit (INDEPENDENT_AMBULATORY_CARE_PROVIDER_SITE_OTHER): Payer: Medicare Other | Admitting: *Deleted

## 2017-02-02 ENCOUNTER — Telehealth: Payer: Self-pay | Admitting: Cardiology

## 2017-02-02 DIAGNOSIS — I495 Sick sinus syndrome: Secondary | ICD-10-CM

## 2017-02-02 NOTE — Progress Notes (Signed)
Remote pacemaker transmission.   

## 2017-02-02 NOTE — Telephone Encounter (Signed)
Spoke with pt and reminded pt of remote transmission that is due today. Pt verbalized understanding.   

## 2017-02-03 LAB — CUP PACEART REMOTE DEVICE CHECK
Battery Remaining Longevity: 73 mo
Battery Voltage: 3 V
Brady Statistic AP VP Percent: 0.29 %
Brady Statistic AS VP Percent: 0 %
Implantable Lead Implant Date: 20150430
Implantable Lead Implant Date: 20150430
Implantable Lead Location: 753860
Implantable Lead Model: 5076
Implantable Lead Model: 5076
Implantable Pulse Generator Implant Date: 20150430
Lead Channel Impedance Value: 399 Ohm
Lead Channel Impedance Value: 475 Ohm
Lead Channel Impedance Value: 551 Ohm
Lead Channel Pacing Threshold Amplitude: 0.875 V
Lead Channel Pacing Threshold Amplitude: 1.125 V
Lead Channel Pacing Threshold Pulse Width: 0.4 ms
Lead Channel Sensing Intrinsic Amplitude: 0.875 mV
Lead Channel Sensing Intrinsic Amplitude: 0.875 mV
MDC IDC LEAD LOCATION: 753859
MDC IDC MSMT LEADCHNL RA PACING THRESHOLD PULSEWIDTH: 0.4 ms
MDC IDC MSMT LEADCHNL RV IMPEDANCE VALUE: 627 Ohm
MDC IDC MSMT LEADCHNL RV SENSING INTR AMPL: 31.625 mV
MDC IDC MSMT LEADCHNL RV SENSING INTR AMPL: 31.625 mV
MDC IDC SESS DTM: 20181113192741
MDC IDC SET LEADCHNL RA PACING AMPLITUDE: 2.5 V
MDC IDC SET LEADCHNL RV PACING AMPLITUDE: 2.5 V
MDC IDC SET LEADCHNL RV PACING PULSEWIDTH: 0.4 ms
MDC IDC SET LEADCHNL RV SENSING SENSITIVITY: 4 mV
MDC IDC STAT BRADY AP VS PERCENT: 99.7 %
MDC IDC STAT BRADY AS VS PERCENT: 0.01 %
MDC IDC STAT BRADY RA PERCENT PACED: 99.98 %
MDC IDC STAT BRADY RV PERCENT PACED: 0.28 %

## 2017-02-05 ENCOUNTER — Encounter: Payer: Self-pay | Admitting: Cardiology

## 2017-02-19 DIAGNOSIS — I10 Essential (primary) hypertension: Secondary | ICD-10-CM | POA: Diagnosis not present

## 2017-02-19 DIAGNOSIS — E039 Hypothyroidism, unspecified: Secondary | ICD-10-CM | POA: Diagnosis not present

## 2017-02-19 DIAGNOSIS — E782 Mixed hyperlipidemia: Secondary | ICD-10-CM | POA: Diagnosis not present

## 2017-02-23 DIAGNOSIS — Z23 Encounter for immunization: Secondary | ICD-10-CM | POA: Diagnosis not present

## 2017-02-23 DIAGNOSIS — E039 Hypothyroidism, unspecified: Secondary | ICD-10-CM | POA: Diagnosis not present

## 2017-02-23 DIAGNOSIS — M810 Age-related osteoporosis without current pathological fracture: Secondary | ICD-10-CM | POA: Diagnosis not present

## 2017-02-23 DIAGNOSIS — I482 Chronic atrial fibrillation: Secondary | ICD-10-CM | POA: Diagnosis not present

## 2017-03-19 ENCOUNTER — Telehealth: Payer: Self-pay | Admitting: Cardiovascular Disease

## 2017-03-19 MED ORDER — HYDROCHLOROTHIAZIDE 12.5 MG PO CAPS: 12.5000 mg | ORAL_CAPSULE | Freq: Every day | ORAL | 3 refills | Status: DC

## 2017-03-19 MED ORDER — LOSARTAN POTASSIUM 50 MG PO TABS: 50.0000 mg | ORAL_TABLET | Freq: Every day | ORAL | 3 refills | Status: DC

## 2017-03-19 NOTE — Telephone Encounter (Signed)
New Message   Pt c/o medication issue:  1. Name of Medication: losartan-hydrochlorothiazide (HYZAAR) 100-12.5 MG tablet 2. How are you currently taking this medication (dosage and times per day)? 100-12.5 MG tablet once a day  3. Are you having a reaction (difficulty breathing--STAT)? Leg swelling  4. What is your medication issue? Patient states that her leg is swelling and she is having difficult walking

## 2017-03-19 NOTE — Telephone Encounter (Signed)
Okay to give hctz 12.5 mg qd.   If patient does not have home BP cuff, would recommend scheduling her for CVRR visit to evaluate if she has orthostatic problems and get her on appropriate meds

## 2017-03-19 NOTE — Telephone Encounter (Signed)
Returned call to pt states that she was feeling dizzy intermittently and PCP(~ 3 weeks ago) said this was from the Losartan sh, he cut dose to 50mg  of just losartan and no HCTZ, this has not helped. Pt states that she is swollen so bad she "can hardly walk". Pt states that she is still dizzy intermittently, when she stands up, when she bends over. She denies any other sx, no SOB, nausea. She states that she has H/O vertigo and she states that she thinks that is what it is and not the Losartan.  She would like an rx the "the water pill" again, maybe this will help with the swelling. Ok for HCTZ 12.5mg ? SIG?

## 2017-03-19 NOTE — Telephone Encounter (Signed)
Pt will take daily weights and wear compression stockings and will bring log with her to BP visit on 12-31

## 2017-03-21 ENCOUNTER — Emergency Department (HOSPITAL_COMMUNITY): Payer: Medicare Other

## 2017-03-21 ENCOUNTER — Other Ambulatory Visit: Payer: Self-pay

## 2017-03-21 ENCOUNTER — Emergency Department (HOSPITAL_COMMUNITY)
Admission: EM | Admit: 2017-03-21 | Discharge: 2017-03-21 | Disposition: A | Discharge status: Home / Self Care | Payer: Medicare Other | Attending: Emergency Medicine | Admitting: Emergency Medicine

## 2017-03-21 ENCOUNTER — Encounter (HOSPITAL_COMMUNITY): Payer: Self-pay

## 2017-03-21 DIAGNOSIS — Z79899 Other long term (current) drug therapy: Secondary | ICD-10-CM | POA: Diagnosis not present

## 2017-03-21 DIAGNOSIS — M25561 Pain in right knee: Secondary | ICD-10-CM | POA: Diagnosis not present

## 2017-03-21 DIAGNOSIS — Z950 Presence of cardiac pacemaker: Secondary | ICD-10-CM | POA: Diagnosis not present

## 2017-03-21 DIAGNOSIS — Z7901 Long term (current) use of anticoagulants: Secondary | ICD-10-CM | POA: Diagnosis not present

## 2017-03-21 DIAGNOSIS — M25551 Pain in right hip: Secondary | ICD-10-CM | POA: Diagnosis not present

## 2017-03-21 DIAGNOSIS — I10 Essential (primary) hypertension: Secondary | ICD-10-CM | POA: Diagnosis not present

## 2017-03-21 DIAGNOSIS — E039 Hypothyroidism, unspecified: Secondary | ICD-10-CM | POA: Insufficient documentation

## 2017-03-21 DIAGNOSIS — M79604 Pain in right leg: Secondary | ICD-10-CM | POA: Diagnosis not present

## 2017-03-21 LAB — CBC WITH DIFFERENTIAL/PLATELET
Basophils Absolute: 0 10*3/uL (ref 0.0–0.1)
Basophils Relative: 0 %
EOS PCT: 0 %
Eosinophils Absolute: 0 10*3/uL (ref 0.0–0.7)
HCT: 43.8 % (ref 36.0–46.0)
Hemoglobin: 14.6 g/dL (ref 12.0–15.0)
LYMPHS ABS: 2.1 10*3/uL (ref 0.7–4.0)
LYMPHS PCT: 24 %
MCH: 30.5 pg (ref 26.0–34.0)
MCHC: 33.3 g/dL (ref 30.0–36.0)
MCV: 91.4 fL (ref 78.0–100.0)
MONO ABS: 1 10*3/uL (ref 0.1–1.0)
Monocytes Relative: 12 %
Neutro Abs: 5.6 10*3/uL (ref 1.7–7.7)
Neutrophils Relative %: 64 %
PLATELETS: 195 10*3/uL (ref 150–400)
RBC: 4.79 MIL/uL (ref 3.87–5.11)
RDW: 14.3 % (ref 11.5–15.5)
WBC: 8.7 10*3/uL (ref 4.0–10.5)

## 2017-03-21 LAB — COMPREHENSIVE METABOLIC PANEL
ALT: 29 U/L (ref 14–54)
AST: 34 U/L (ref 15–41)
Albumin: 4 g/dL (ref 3.5–5.0)
Alkaline Phosphatase: 95 U/L (ref 38–126)
Anion gap: 14 (ref 5–15)
BUN: 15 mg/dL (ref 6–20)
CHLORIDE: 96 mmol/L — AB (ref 101–111)
CO2: 24 mmol/L (ref 22–32)
CREATININE: 0.94 mg/dL (ref 0.44–1.00)
Calcium: 9.1 mg/dL (ref 8.9–10.3)
GFR, EST NON AFRICAN AMERICAN: 53 mL/min — AB (ref >60)
Glucose, Bld: 90 mg/dL (ref 65–99)
POTASSIUM: 3.4 mmol/L — AB (ref 3.5–5.1)
Sodium: 134 mmol/L — ABNORMAL LOW (ref 135–145)
Total Bilirubin: 0.9 mg/dL (ref 0.3–1.2)
Total Protein: 7.8 g/dL (ref 6.5–8.1)

## 2017-03-21 LAB — D-DIMER, QUANTITATIVE (NOT AT ARMC): D DIMER QUANT: 0.64 ug{FEU}/mL — AB (ref 0.00–0.50)

## 2017-03-21 LAB — CK: CK TOTAL: 178 U/L (ref 38–234)

## 2017-03-21 LAB — URIC ACID: Uric Acid, Serum: 3.5 mg/dL (ref 2.3–6.6)

## 2017-03-21 NOTE — ED Provider Notes (Signed)
[facility] EMERGENCY DEPARTMENT Provider Note   CSN: [id number] Arrival date & time: 03/21/17  1248     History   Chief Complaint Chief Complaint  Patient presents with  . Leg Pain    HPI Denise Pugh is a 83 y.o. female.  HPI Patient presents with right leg swelling and pain that started Thursday.  States his progressed to the point where she is having trouble ambulating due to pain.  No fever or chills.  States the pain radiates from her right hip down to her right knee.  She also notes pain behind her right knee.  No known injury.  Patient states she has chronic low back pain.  No history of gout. Past Medical History:  Diagnosis Date  . Arrhythmia    HX of SVT. CARDIONET MONITOR 07/15/12 TO 08/13/12  . Arthritis   . GERD (gastroesophageal reflux disease)   . Hyperlipidemia 08/06/11   lexiscan myoview-normal. Due to severe attenuation the study specificity and sensitivty are deminished.  . Hypertension 08/25/10   ECHO-EF 60-65%  . Hypothyroidism   . Irregular heart beat   . Shortness of breath   . Sleep apnea    SLEEP STUDY-[facility]  . Thyroid disease     Patient Active Problem List   Diagnosis Date Noted  . First degree AV block 01/13/2016  . Long term current use of anticoagulant 01/13/2016  . Anticoagulation adequate 02/12/2015  . Hyperlipidemia LDL goal <70 02/28/2014  . Tachy-brady syndrome ([place]) 07/21/2013  . Bradycardia, drug induced 07/20/2013  . Symptomatic sinus bradycardia 07/20/2013  . S/P cardiac pacemaker procedure: Dr. Sallyanne Kuster: Generator [employer] Advisa MRI  07/20/2013    Class: Hospitalized for  . Atrial fibrillation with RVR ([place]) 07/18/2013  . Bradycardia, also idioventricular rhythm  07/18/2013  . PAF (paroxysmal atrial fibrillation) ([place]) 06/26/2013  . Grieving 05/29/2013  . CAP (community acquired pneumonia) 04/08/2011  . Hypothyroidism 04/08/2011  . HAMSTRING TENDINITIS 06/15/2008  . RHINITIS, CHRONIC  04/30/2008  . HIP PAIN, RIGHT 04/30/2008  . KNEE PAIN, BILATERAL 04/30/2008  . INTERTRIGO, CANDIDAL 01/05/2008  . DIARRHEA 01/05/2008  . COSTOCHONDRITIS 06/29/2007  . HYPERLIPIDEMIA 09/30/2006  . FATIGUE 09/30/2006  . PARESTHESIA 09/30/2006  . GASTRIC POLYP March 06, 2006  . HYPOTHYROIDISM 2006/03/06  . DEPRESSION, recent death of her husband 03/06/2006  . Essential hypertension 03-06-2006  . SUPRAVENTRICULAR TACHYCARDIA 2006/03/06  . GERD 03-06-2006  . MELANOSIS COLI 03-06-2006  . OSTEOARTHRITIS 03/06/2006  . LOW BACK PAIN 03/06/06  . DIVERTICULITIS, HX OF 03-06-06    Past Surgical History:  Procedure Laterality Date  . CARDIAC CATHETERIZATION  07/06/01   spade-like configuration  . DILATION AND CURETTAGE OF UTERUS    . EYE SURGERY    . KNEE ARTHROSCOPY    . PERMANENT PACEMAKER INSERTION N/A 07/20/2013   Procedure: PERMANENT PACEMAKER INSERTION;  Surgeon: [name], MD;  Location: [place] CATH LAB;  Service: Cardiovascular;  Laterality: N/A;    OB History    Gravida Para Term Preterm AB Living             1   SAB TAB Ectopic Multiple Live Births                   Home Medications    Prior to Admission medications   Medication Sig Start Date End Date Taking? Authorizing Provider  acetaminophen (TYLENOL) 500 MG tablet Take 500 mg by mouth as needed (pain). For fever   Yes [provider]  amiodarone (  PACERONE) 200 MG tablet TAKE 1 TABLET BY MOUTH  DAILY 07/14/16  Yes Troy Sine, MD  apixaban (ELIQUIS) 5 MG TABS tablet Take 1 tablet (5 mg total) by mouth 2 (two) times daily. 01/07/17  Yes Troy Sine, MD  atorvastatin (LIPITOR) 20 MG tablet TAKE 1/2 TABLET BY MOUTH ONCE DAILY 10/01/16  Yes Troy Sine, MD  Cholecalciferol (VITAMIN D) 2000 UNITS tablet Take 2,000 Units by mouth daily.   Yes [provider]  dexlansoprazole (DEXILANT) 60 MG capsule Take 60 mg by mouth daily.   Yes [provider]  docusate sodium (COLACE) 100 MG  capsule Take 100 mg by mouth 2 (two) times daily.   Yes [provider]  escitalopram (LEXAPRO) 5 MG tablet Take 5 mg by mouth daily. 10/11/13  Yes [provider]  hydrochlorothiazide (MICROZIDE) 12.5 MG capsule Take 1 capsule (12.5 mg total) by mouth daily. 03/19/17 06/17/17 Yes Troy Sine, MD  levothyroxine (SYNTHROID, LEVOTHROID) 75 MCG tablet Take 75 mcg by mouth daily.   Yes [provider]  metoprolol (LOPRESSOR) 50 MG tablet TAKE 1 TABLET BY MOUTH TWO  TIMES DAILY 07/14/16  Yes Troy Sine, MD  oxybutynin (DITROPAN) 5 MG tablet Take 1 tablet by mouth 2 (two) times daily. Take 1 tab twice a day 01/21/15  Yes [provider]  traMADol (ULTRAM) 50 MG tablet Take 100 mg by mouth 2 (two) times daily. Take 2 tablets twice daily 08/09/12  Yes [provider]    Family History Family History  Problem Relation Age of Onset  . Diabetes Mother   . Hypertension Father   . Diabetes Father   . Heart attack Maternal Grandmother   . Heart attack Maternal Grandfather     Social History Social History   Tobacco Use  . Smoking status: Never Smoker  . Smokeless tobacco: Never Used  Substance Use Topics  . Alcohol use: No    Alcohol/week: 0.0 oz  . Drug use: No     Allergies   Penicillins and Tetanus toxoid   Review of Systems Review of Systems  Constitutional: Negative for chills and fever.  Respiratory: Negative for cough and shortness of breath.   Cardiovascular: Positive for leg swelling. Negative for chest pain and palpitations.  Gastrointestinal: Negative for abdominal pain, diarrhea, nausea and vomiting.  Genitourinary: Negative for dysuria, flank pain, frequency and hematuria.  Musculoskeletal: Positive for arthralgias, joint swelling and myalgias. Negative for neck pain and neck stiffness.  Skin: Negative for rash and wound.  Neurological: Positive for weakness. Negative for dizziness, light-headedness and numbness.  All  other systems reviewed and are negative.    Physical Exam Updated Vital Signs BP (!) 201/62 (BP Location: Right Arm)   Pulse 65   Temp 98.5 F (36.9 C) (Oral)   Resp 18   Ht 5\' 3"  (1.6 m)   Wt 75.3 kg (166 lb)   SpO2 96%   BMI 29.41 kg/m   Physical Exam  Constitutional: She is oriented to person, place, and time. She appears well-developed and well-nourished. No distress.  HENT:  Head: Normocephalic and atraumatic.  Mouth/Throat: Oropharynx is clear and moist.  Eyes: EOM are normal. Pupils are equal, round, and reactive to light.  Neck: Normal range of motion. Neck supple.  Cardiovascular: Normal rate and regular rhythm.  Pulmonary/Chest: Effort normal and breath sounds normal.  Abdominal: Soft. Bowel sounds are normal. There is no tenderness. There is no rebound and no guarding.  Musculoskeletal: Normal  range of motion. She exhibits no edema or tenderness.  No midline thoracic or lumbar tenderness.  No CVA tenderness.  Patient does have pain with range of motion of the right hip.  Tenderness to palpation over the right hip laterally.  She also has tenderness down the lateral surface of the right thigh.  There is a small joint effusion of the right knee.  Pain with range of motion of the knee.  The knee appears warm compared to the left.  Mild popliteal fossa tenderness to palpation.  No calf tenderness.  2+ dorsalis pedis and posterior tibial pulses bilaterally.  Neurological: She is alert and oriented to person, place, and time.  Skin: Skin is warm and dry. Capillary refill takes less than 2 seconds. No rash noted. No erythema.  Psychiatric: She has a normal mood and affect. Her behavior is normal.  Nursing note and vitals reviewed.    ED Treatments / Results  Labs (all labs ordered are listed, but only abnormal results are displayed) Labs Reviewed  COMPREHENSIVE METABOLIC PANEL - Abnormal; Notable for the following components:      Result Value   Sodium 134 (*)     Potassium 3.4 (*)    Chloride 96 (*)    GFR calc non Af Amer 53 (*)    All other components within normal limits  D-DIMER, QUANTITATIVE (NOT AT [facility]) - Abnormal; Notable for the following components:   D-Dimer, Quant 0.64 (*)    All other components within normal limits  CBC WITH DIFFERENTIAL/PLATELET  CK  URIC ACID    EKG  EKG Interpretation None       Radiology Dg Knee Complete 4 Views Right  Result Date: 03/21/2017 CLINICAL DATA:  Right leg pain for 3 days.  No injury. EXAM: RIGHT KNEE - COMPLETE 4+ VIEW COMPARISON:  None. FINDINGS: No evidence of fracture, dislocation, or joint effusion. There is chondrocalcinosis. Narrowed joint space with osteophyte formation is identified within the right knee. Soft tissues are unremarkable. IMPRESSION: No acute fracture or dislocation. Electronically Signed   By: Abelardo Diesel M.D.   On: 03/21/2017 17:25   Dg Hip Unilat W Or Wo Pelvis 2-3 Views Right  Result Date: 03/21/2017 CLINICAL DATA:  Right hip pain.  No injury. EXAM: DG HIP (WITH OR WITHOUT PELVIS) 2-3V RIGHT COMPARISON:  None. FINDINGS: There is no evidence of hip fracture or dislocation. Degenerative joint changes with narrowed joint space and osteophyte formation of the hips and lumbar spine are noted. IMPRESSION: No acute fracture or dislocation. Electronically Signed   By: Abelardo Diesel M.D.   On: 03/21/2017 17:26    Procedures Procedures (including critical care time)  Medications Ordered in ED Medications - No data to display   Initial Impression / Assessment and Plan / ED Course  I have reviewed the triage vital signs and the nursing notes.  Pertinent labs & imaging results that were available during my care of the patient were reviewed by me and considered in my medical decision making (see chart for details).    Normal white blood cell count.  Normal uric acid.  X-rays without acute findings.  Patient does have previous history of meniscal tear in the right knee.   This may be contributing to her symptoms.  She is on Eliquis and has been compliant.  I have low suspicion for DVT.  Discussed with patient to rule out completely would need to perform ultrasound.  She will return in the morning to have that done.  She knows she needs to follow-up with her orthopedist.  She is been given return precautions and is voiced understanding.   Final Clinical Impressions(s) / ED Diagnoses   Final diagnoses:  Right knee pain, unspecified chronicity    ED Discharge Orders        Ordered    US Venous Img Lower Unilateral Right     03/21/17 [place]     03/21/17 1808    Face-to-face encounter (required for Medicare/Medicaid patients)    Comments:  I Julianne Rice certify that this patient is under my care and that I, or a nurse practitioner or physician's assistant working with me, had a face-to-face encounter that meets the physician face-to-face encounter requirements with this patient on 03/21/2017. The encounter with the patient was in whole, or in part for the following medical condition(s) which is the primary reason for home health care (List medical condition): Right knee pain   03/21/17 1808       Julianne Rice, MD 03/21/17 1810

## 2017-03-21 NOTE — ED Notes (Addendum)
Pt trying to ride home. Friend has brought wheelchair from home

## 2017-03-21 NOTE — ED Triage Notes (Addendum)
Right leg pain x3 days when walking or sitting. Denies known injury. Swelling/redness noted to right lower leg. Dorsalis pedis pulse noted in right lower ext.

## 2017-03-22 ENCOUNTER — Ambulatory Visit: Payer: Self-pay

## 2017-03-22 ENCOUNTER — Telehealth: Payer: Self-pay | Admitting: Cardiovascular Disease

## 2017-03-22 NOTE — Telephone Encounter (Signed)
Attempted to reach patient, line disconnects when dialed.

## 2017-03-22 NOTE — Telephone Encounter (Signed)
New message ° °Pt verbalized that she is calling for the rn °

## 2017-03-24 NOTE — Telephone Encounter (Signed)
F/U Call  Patient calling, states that she has been sick. Patient is calling for instructions in regards to medication.

## 2017-03-24 NOTE — Telephone Encounter (Signed)
Returned call to patient.She was very upset and crying.She stated she has no one to help her.She has been having pain in right leg since last Thursday 03/18/17.Stated hard to walk.She is also having dizziness.She went to [facility] ER this past Sunday.Stated ER Dr.advised her she needs a doppler to check for a blood clot.Appointment scheduled with Almyra Deforest 03/25/17 at 9:30 am.Advised to bring all medications to appointment.

## 2017-03-25 ENCOUNTER — Ambulatory Visit (HOSPITAL_COMMUNITY)
Admission: RE | Admit: 2017-03-25 | Discharge: 2017-03-25 | Disposition: A | Discharge status: Home / Self Care | Payer: Medicare Other | Source: Ambulatory Visit | Attending: Cardiovascular Disease | Admitting: Cardiovascular Disease

## 2017-03-25 ENCOUNTER — Encounter: Payer: Self-pay | Admitting: Physician Assistant

## 2017-03-25 ENCOUNTER — Ambulatory Visit (HOSPITAL_BASED_OUTPATIENT_CLINIC_OR_DEPARTMENT_OTHER)
Admission: RE | Admit: 2017-03-25 | Discharge: 2017-03-25 | Disposition: A | Discharge status: Home / Self Care | Payer: Medicare Other | Source: Ambulatory Visit | Attending: Physician Assistant | Admitting: Physician Assistant

## 2017-03-25 ENCOUNTER — Ambulatory Visit (INDEPENDENT_AMBULATORY_CARE_PROVIDER_SITE_OTHER): Payer: Medicare Other | Admitting: Physician Assistant

## 2017-03-25 ENCOUNTER — Encounter (HOSPITAL_COMMUNITY): Payer: Self-pay

## 2017-03-25 ENCOUNTER — Other Ambulatory Visit: Payer: Self-pay | Admitting: Physician Assistant

## 2017-03-25 VITALS — BP 111/67 | HR 98 | Ht 63.0 in | Wt 165.0 lb

## 2017-03-25 DIAGNOSIS — Z7689 Persons encountering health services in other specified circumstances: Secondary | ICD-10-CM | POA: Diagnosis not present

## 2017-03-25 DIAGNOSIS — M79604 Pain in right leg: Secondary | ICD-10-CM

## 2017-03-25 DIAGNOSIS — R0989 Other specified symptoms and signs involving the circulatory and respiratory systems: Secondary | ICD-10-CM | POA: Insufficient documentation

## 2017-03-25 DIAGNOSIS — G8911 Acute pain due to trauma: Secondary | ICD-10-CM | POA: Diagnosis not present

## 2017-03-25 DIAGNOSIS — I10 Essential (primary) hypertension: Secondary | ICD-10-CM

## 2017-03-25 DIAGNOSIS — E785 Hyperlipidemia, unspecified: Secondary | ICD-10-CM

## 2017-03-25 DIAGNOSIS — E039 Hypothyroidism, unspecified: Secondary | ICD-10-CM

## 2017-03-25 DIAGNOSIS — R42 Dizziness and giddiness: Secondary | ICD-10-CM | POA: Diagnosis not present

## 2017-03-25 DIAGNOSIS — R279 Unspecified lack of coordination: Secondary | ICD-10-CM | POA: Diagnosis not present

## 2017-03-25 DIAGNOSIS — I517 Cardiomegaly: Secondary | ICD-10-CM

## 2017-03-25 DIAGNOSIS — G4733 Obstructive sleep apnea (adult) (pediatric): Secondary | ICD-10-CM | POA: Diagnosis not present

## 2017-03-25 DIAGNOSIS — I744 Embolism and thrombosis of arteries of extremities, unspecified: Secondary | ICD-10-CM | POA: Diagnosis not present

## 2017-03-25 DIAGNOSIS — I4891 Unspecified atrial fibrillation: Secondary | ICD-10-CM | POA: Diagnosis not present

## 2017-03-25 NOTE — Patient Instructions (Addendum)
Schedule lower ext venous doppler right leg   Schedule lower ext arterial dopplers both legs   Stop Losartan   Only take HCTZ if needed for swelling   Keep appointment with Dr.Croitoru 03/30/17 at 1:20 pm   Appointment with [facility] Wednesday 06/23/17 at 11:00 am   Home Health Evaluation    Home Physical Therapy Evaluation

## 2017-03-25 NOTE — Progress Notes (Unsigned)
Today's right lower extremity venous duplex is negative for DVT. Preliminary results given to [facility].

## 2017-03-25 NOTE — Progress Notes (Signed)
Cardiology Office Note    Date:  03/27/2017   ID:  Denise Pugh, DOB 02-Jul-1928, MRN [id number]  PCP:  [name], MD  Cardiologist:  [name] / Dr. Sallyanne Kuster  Chief Complaint  Patient presents with  . Follow-up    dizziness, pain in right leg    History of Present Illness:  Denise Pugh is a 84 y.o. female with PMH of SVT, PAF on amiodarone and eliquis, moderate LVH with documented "spade-like ventricle", HLD, HTN, hypothyroidism, and OSA.  She previously had diffuse T wave abnormality.  A stress test in May 2013 showed breast attenuation artifact with post stress EF 70% without evidence of scar or ischemia.  Previous heart monitor showed SVT with heart rate up to 185 bpm, her beta-blocker was increased.  However follow-up heart monitor after increasing the dose of Toprol-XL continued to reveal bursts of atrial tachycardia/atrial flutter at a rate of 189 bpm.  She was admitted in April 2015 with A. fib with RVR.  She was started on amiodarone as heart monitor showed sinus tachycardia, PAF and a questionable nonsustained VT versus atrial fibrillation with aberrancy.  She eventually underwent permanent pacemaker insertion.  Last echocardiogram obtained on 07/02/2014 showed EF 55-60%, mild focal basal hypertrophy of the septum, grade 2 DD, mild to moderate AI, mild MR.  Patient recently presented to any pain ED for evaluation of right hip pain, she says she has been having right hip pain especially when standing on her right side.  The sharp shooting pain originate from her right knee shooting up.  Her symptoms sounds more consistent with arthritic pain.  Interestingly, on physical exam, she has very weak pulse in bilateral lower extremity.  On further questioning she says the more she walks the worse her leg will hurt.  I recommended a bilateral ABI to rule out significant arterial disease.  Since her husband died 4 years ago, she has been living alone.  Although she has a nephew who live next  door, she has no children to take care of her.  I recommended a home health nursing aide and also PT as well.  She has been having significant dizziness especially with change in body position, she is fairly weak today, therefore we did not obtain orthostatic vital signs.  However her symptom is consistent with orthostatic signs and symptoms.  Her PCP recently discontinued her losartan-HCTZ which I think is the right choice.  Her HCTZ later was restarted by our office for complaint of right lower extremity swelling.  I do not see any significant swelling on physical exam, nor do I see any sign of heart failure symptoms.  I recommended use HCTZ only on as-needed basis.  According to the patient, her PCP also gave her a short course of losartan however she finished the course of losartan 2 days ago and has not picked up any new prescription.  Since her systolic blood pressure is already in the 110s, I do not recommend restarting losartan at this point.  I am in favor of minimize the number of blood pressure medication.  Unfortunately I am unable to lower the dose of metoprolol due to her tachycardia and her history of SVT and PAF.  Her heart sounds tachycardic and irregular today, EKG obtained showed rate controlled afib with HR 90s, I am unable to uptitrate BB given her BP.  She is also scheduled for a repeat echocardiogram.by [name] in January.    Past Medical History:  Diagnosis  Date  . Arrhythmia    HX of SVT. CARDIONET MONITOR 07/15/12 TO 08/13/12  . Arthritis   . GERD (gastroesophageal reflux disease)   . Hyperlipidemia 08/06/11   lexiscan myoview-normal. Due to severe attenuation the study specificity and sensitivty are deminished.  . Hypertension 08/25/10   ECHO-EF 60-65%  . Hypothyroidism   . Irregular heart beat   . Shortness of breath   . Sleep apnea    SLEEP STUDY-[facility]  . Thyroid disease     Past Surgical History:  Procedure Laterality Date  . CARDIAC  CATHETERIZATION  07/06/01   spade-like configuration  . DILATION AND CURETTAGE OF UTERUS    . EYE SURGERY    . KNEE ARTHROSCOPY    . PERMANENT PACEMAKER INSERTION N/A 07/20/2013   Procedure: PERMANENT PACEMAKER INSERTION;  Surgeon: [name], MD;  Location: [place] CATH LAB;  Service: Cardiovascular;  Laterality: N/A;    Current Medications: Outpatient Medications Prior to Visit  Medication Sig Dispense Refill  . acetaminophen (TYLENOL) 500 MG tablet Take 500 mg by mouth as needed (pain). For fever    . amiodarone (PACERONE) 200 MG tablet TAKE 1 TABLET BY MOUTH  DAILY 90 tablet 3  . apixaban (ELIQUIS) 5 MG TABS tablet Take 1 tablet (5 mg total) by mouth 2 (two) times daily. 180 tablet 1  . atorvastatin (LIPITOR) 20 MG tablet TAKE 1/2 TABLET BY MOUTH ONCE DAILY 15 tablet 4  . Cholecalciferol (VITAMIN D) 2000 UNITS tablet Take 2,000 Units by mouth daily.    Marland Kitchen dexlansoprazole (DEXILANT) 60 MG capsule Take 60 mg by mouth daily.    Marland Kitchen docusate sodium (COLACE) 100 MG capsule Take 100 mg by mouth 2 (two) times daily.    Marland Kitchen escitalopram (LEXAPRO) 5 MG tablet Take 5 mg by mouth daily.    . hydrochlorothiazide (MICROZIDE) 12.5 MG capsule Take 1 capsule (12.5 mg total) by mouth daily. 30 capsule 3  . levothyroxine (SYNTHROID, LEVOTHROID) 75 MCG tablet Take 75 mcg by mouth daily.    . metoprolol (LOPRESSOR) 50 MG tablet TAKE 1 TABLET BY MOUTH TWO  TIMES DAILY 180 tablet 3  . oxybutynin (DITROPAN) 5 MG tablet Take 1 tablet by mouth 2 (two) times daily. Take 1 tab twice a day    . traMADol (ULTRAM) 50 MG tablet Take 100 mg by mouth 2 (two) times daily. Take 2 tablets twice daily     No facility-administered medications prior to visit.      Allergies:   Penicillins and Tetanus toxoid   Social History   Socioeconomic History  . Marital status: Married    Spouse name: None  . Number of children: None  . Years of education: None  . Highest education level: None  Social Needs  . Financial resource  strain: None  . Food insecurity - worry: None  . Food insecurity - inability: None  . Transportation needs - medical: None  . Transportation needs - non-medical: None  Occupational History  . None  Tobacco Use  . Smoking status: Never Smoker  . Smokeless tobacco: Never Used  Substance and Sexual Activity  . Alcohol use: No    Alcohol/week: 0.0 oz  . Drug use: No  . Sexual activity: No  Other Topics Concern  . None  Social History Narrative  . None     Family History:  The patient's family history includes Diabetes in her father and mother; Heart attack in her maternal grandfather and maternal grandmother; Hypertension in  her father.   ROS:   Please see the history of present illness.    ROS All other systems reviewed and are negative.   PHYSICAL EXAM:   VS:  BP 111/67   Pulse 98   Ht 5\' 3"  (1.6 m)   Wt 165 lb (74.8 kg)   BMI 29.23 kg/m    GEN: Well nourished, well developed, in no acute distress  HEENT: normal  Neck: no JVD, carotid bruits, or masses Cardiac: irregular; no murmurs, rubs, or gallops,no edema  Respiratory:  clear to auscultation bilaterally, normal work of breathing GI: soft, nontender, nondistended, + BS MS: no deformity or atrophy  Skin: warm and dry, no rash Neuro:  Alert and Oriented x 3, Strength and sensation are intact Psych: euthymic mood, full affect  Wt Readings from Last 3 Encounters:  03/25/17 165 lb (74.8 kg)  03/21/17 166 lb (75.3 kg)  12/23/16 166 lb 12.8 oz (75.7 kg)      Studies/Labs Reviewed:   EKG:  EKG is ordered today.  The ekg ordered today demonstrates atrial fibrillation with HR 90s  Recent Labs: 03/21/2017: ALT 29; BUN 15; Creatinine, Ser 0.94; Hemoglobin 14.6; Platelets 195; Potassium 3.4; Sodium 134   Lipid Panel    Component Value Date/Time   CHOL 120 07/19/2013 0539   TRIG 52 07/19/2013 0539   HDL 45 07/19/2013 0539   CHOLHDL 2.7 07/19/2013 0539   VLDL 10 07/19/2013 0539   LDLCALC 65 07/19/2013 0539     Additional studies/ records that were reviewed today include:   Echo 07/02/2014 LV EF: 55% -  60%  ------------------------------------------------------------------- Indications:   Dyspnea (R06.00).  ------------------------------------------------------------------- History:  PMH: Palpitations, Supraventricular Tachycardia, Obstructive Sleep Apnea, GERD. Dyspnea.  ------------------------------------------------------------------- Study Conclusions  - Left ventricle: The cavity size was normal. Wall thickness was increased in a pattern of mild LVH. There was mild focal basal hypertrophy of the septum. Systolic function was normal. The estimated ejection fraction was in the range of 55% to 60%. Wall motion was normal; there were no regional wall motion abnormalities. Features are consistent with a pseudonormal left ventricular filling pattern, with concomitant abnormal relaxation and increased filling pressure (grade 2 diastolic dysfunction). Doppler parameters are consistent with high ventricular filling pressure. - Aortic valve: There was mild to moderate regurgitation. - Mitral valve: There was mild regurgitation. - Left atrium: The atrium was mildly dilated. - Pulmonary arteries: Systolic pressure was mildly increased.  Impressions:  - Normal LV function; grade 2 diastolic dysfunction; mild LAE; mild to moderate AI; mild MR and TR; mildly elevated pulmonary pressure.   ASSESSMENT:    1. Pain in right leg   2. Atrial fibrillation, unspecified type ([place])   3. Essential hypertension   4. LVH (left ventricular hypertrophy)   5. Hyperlipidemia, unspecified hyperlipidemia type   6. Hypothyroidism, unspecified type   7. OSA (obstructive sleep apnea)   8. Orthostatic dizziness      PLAN:  In order of problems listed above:  1. RLE pain: Recently presented to the ED, ED physician recommended a right lower extremity venous Doppler,  patient has been compliant with her systemic anticoagulation, suspicion for DVT relatively low.  However I am okay with venous Doppler to double check.  I am more interested in arterial blood flow, she has relatively weak pulses in the lower extremity, I will obtain ABI.  Her symptom also sounds arthritic in nature as the pain originate from her knee shooting up.  2. Orthostatic dizziness: I did not  check orthostatic vital signs today as she is fairly weak.  Her symptom is consistent with orthostatic dizziness.  I asked her to get up slowly in the morning and when she changed body positions.    3. Atrial fibrillation: Eliquis 5 mg twice daily.  Likely persistent by this point.  Heart rate in the 90s, I am unable to uptitrate beta-blocker due to orthostatic dizziness.  Continue rate control  4. Hypertension: We will discontinue losartan as her systolic blood pressure is borderline 2 days after running out of losartan.  I recommended she use hydrochlorothiazide only on a as needed basis.  5. Hyperlipidemia: On Lipitor 20 mg daily.  6. Hypothyroidism: Managed by primary care provider    Medication Adjustments/Labs and Tests Ordered: Current medicines are reviewed at length with the patient today.  Concerns regarding medicines are outlined above.  Medication changes, Labs and Tests ordered today are listed in the Patient Instructions below. Patient Instructions  Schedule lower ext venous doppler right leg   Schedule lower ext arterial dopplers both legs   Stop Losartan   Only take HCTZ if needed for swelling   Keep appointment with Dr.Croitoru 03/30/17 at 1:20 pm   Appointment with [facility] Wednesday 06/23/17 at 11:00 am   Home Health Evaluation    Home Physical Therapy Evaluation    Hilbert Corrigan, [place]  03/27/2017 9:02 AM    [address], [place], [place]  [address] Phone: [phone]; Fax: [phone]

## 2017-03-26 ENCOUNTER — Telehealth: Payer: Self-pay | Admitting: Cardiovascular Disease

## 2017-03-26 NOTE — Progress Notes (Signed)
Mildly decreased ABI on the right, but still shows triphasic waveform both in the left and right leg suggesting good arterial blood flow

## 2017-03-26 NOTE — Telephone Encounter (Signed)
°  1. Has your device fired? no ° °2. Is you device beeping? no ° °3. Are you experiencing draining or swelling at device site?no ° °4. Are you calling to see if we received your device transmission?yes ° °5. Have you passed out? no ° ° ° °Please route to Device Clinic Pool °

## 2017-03-26 NOTE — Progress Notes (Signed)
No DVT

## 2017-03-26 NOTE — Telephone Encounter (Signed)
Spoke with patient who was concerned that she had missed her remote appointment. I reviewed her up coming appointments including her remote check in February and her OV with Dr. Loletha Grayer next week. Patient verbalized understanding.

## 2017-03-27 ENCOUNTER — Encounter: Payer: Self-pay | Admitting: Physician Assistant

## 2017-03-30 ENCOUNTER — Encounter: Payer: Self-pay | Admitting: Cardiovascular Disease

## 2017-03-30 ENCOUNTER — Other Ambulatory Visit: Payer: Self-pay

## 2017-03-30 ENCOUNTER — Telehealth: Payer: Self-pay | Admitting: Physician Assistant

## 2017-03-30 MED ORDER — AMIODARONE HCL 200 MG PO TABS: 200.0000 mg | ORAL_TABLET | Freq: Every day | ORAL | 3 refills | Status: DC

## 2017-03-30 NOTE — Telephone Encounter (Signed)
Spoke with patient and see order placed Will call [name] in am to follow up  Patient [place] in [place]

## 2017-03-30 NOTE — Telephone Encounter (Signed)
New message    Patient calling, states no one has come out to her home to do home health evaluation as order by Almyra Deforest. Please call

## 2017-03-31 ENCOUNTER — Telehealth: Payer: Self-pay

## 2017-03-31 NOTE — Telephone Encounter (Signed)
Called patient to give her vascular results and she stated what sis she supposed to do about her leg; since her test was fine. She wanted to know how long is she supposed to hobble around on her leg and if there was anything we could do?

## 2017-03-31 NOTE — Telephone Encounter (Signed)
Unable to call [facility] today. Discussed with Jordan Likes and she will call tomorrow

## 2017-04-01 NOTE — Telephone Encounter (Signed)
[facility] referral faxed to [place] at fax # [phone].

## 2017-04-02 NOTE — Telephone Encounter (Signed)
I would recommend either eval by orthopedic vs PCP as her leg pain maybe more arthiritis

## 2017-04-03 DIAGNOSIS — Z79891 Long term (current) use of opiate analgesic: Secondary | ICD-10-CM | POA: Diagnosis not present

## 2017-04-03 DIAGNOSIS — Z950 Presence of cardiac pacemaker: Secondary | ICD-10-CM | POA: Diagnosis not present

## 2017-04-03 DIAGNOSIS — I739 Peripheral vascular disease, unspecified: Secondary | ICD-10-CM | POA: Diagnosis not present

## 2017-04-03 DIAGNOSIS — E785 Hyperlipidemia, unspecified: Secondary | ICD-10-CM | POA: Diagnosis not present

## 2017-04-03 DIAGNOSIS — I471 Supraventricular tachycardia: Secondary | ICD-10-CM | POA: Diagnosis not present

## 2017-04-03 DIAGNOSIS — I4891 Unspecified atrial fibrillation: Secondary | ICD-10-CM | POA: Diagnosis not present

## 2017-04-03 DIAGNOSIS — E039 Hypothyroidism, unspecified: Secondary | ICD-10-CM | POA: Diagnosis not present

## 2017-04-03 DIAGNOSIS — G4733 Obstructive sleep apnea (adult) (pediatric): Secondary | ICD-10-CM | POA: Diagnosis not present

## 2017-04-03 DIAGNOSIS — I10 Essential (primary) hypertension: Secondary | ICD-10-CM | POA: Diagnosis not present

## 2017-04-07 DIAGNOSIS — I10 Essential (primary) hypertension: Secondary | ICD-10-CM | POA: Diagnosis not present

## 2017-04-07 DIAGNOSIS — I739 Peripheral vascular disease, unspecified: Secondary | ICD-10-CM | POA: Diagnosis not present

## 2017-04-07 DIAGNOSIS — I471 Supraventricular tachycardia: Secondary | ICD-10-CM | POA: Diagnosis not present

## 2017-04-07 DIAGNOSIS — E785 Hyperlipidemia, unspecified: Secondary | ICD-10-CM | POA: Diagnosis not present

## 2017-04-07 DIAGNOSIS — Z79891 Long term (current) use of opiate analgesic: Secondary | ICD-10-CM | POA: Diagnosis not present

## 2017-04-07 DIAGNOSIS — E039 Hypothyroidism, unspecified: Secondary | ICD-10-CM | POA: Diagnosis not present

## 2017-04-07 DIAGNOSIS — Z950 Presence of cardiac pacemaker: Secondary | ICD-10-CM | POA: Diagnosis not present

## 2017-04-07 DIAGNOSIS — I4891 Unspecified atrial fibrillation: Secondary | ICD-10-CM | POA: Diagnosis not present

## 2017-04-07 DIAGNOSIS — G4733 Obstructive sleep apnea (adult) (pediatric): Secondary | ICD-10-CM | POA: Diagnosis not present

## 2017-04-09 NOTE — Telephone Encounter (Signed)
Spoke with patient and she said she has an upcoming appointment with her pcp next week.

## 2017-04-13 DIAGNOSIS — M25551 Pain in right hip: Secondary | ICD-10-CM | POA: Diagnosis not present

## 2017-04-13 DIAGNOSIS — I739 Peripheral vascular disease, unspecified: Secondary | ICD-10-CM | POA: Diagnosis not present

## 2017-04-14 DIAGNOSIS — I739 Peripheral vascular disease, unspecified: Secondary | ICD-10-CM | POA: Diagnosis not present

## 2017-04-14 DIAGNOSIS — Z950 Presence of cardiac pacemaker: Secondary | ICD-10-CM | POA: Diagnosis not present

## 2017-04-14 DIAGNOSIS — Z79891 Long term (current) use of opiate analgesic: Secondary | ICD-10-CM | POA: Diagnosis not present

## 2017-04-14 DIAGNOSIS — I471 Supraventricular tachycardia: Secondary | ICD-10-CM | POA: Diagnosis not present

## 2017-04-14 DIAGNOSIS — E785 Hyperlipidemia, unspecified: Secondary | ICD-10-CM | POA: Diagnosis not present

## 2017-04-14 DIAGNOSIS — G4733 Obstructive sleep apnea (adult) (pediatric): Secondary | ICD-10-CM | POA: Diagnosis not present

## 2017-04-14 DIAGNOSIS — I4891 Unspecified atrial fibrillation: Secondary | ICD-10-CM | POA: Diagnosis not present

## 2017-04-14 DIAGNOSIS — I10 Essential (primary) hypertension: Secondary | ICD-10-CM | POA: Diagnosis not present

## 2017-04-14 DIAGNOSIS — E039 Hypothyroidism, unspecified: Secondary | ICD-10-CM | POA: Diagnosis not present

## 2017-04-15 ENCOUNTER — Other Ambulatory Visit: Payer: Self-pay

## 2017-04-15 MED ORDER — METOPROLOL TARTRATE 50 MG PO TABS: 50.0000 mg | ORAL_TABLET | Freq: Two times a day (BID) | ORAL | 3 refills | Status: DC

## 2017-04-16 DIAGNOSIS — E785 Hyperlipidemia, unspecified: Secondary | ICD-10-CM | POA: Diagnosis not present

## 2017-04-16 DIAGNOSIS — I10 Essential (primary) hypertension: Secondary | ICD-10-CM | POA: Diagnosis not present

## 2017-04-16 DIAGNOSIS — E039 Hypothyroidism, unspecified: Secondary | ICD-10-CM | POA: Diagnosis not present

## 2017-04-16 DIAGNOSIS — Z950 Presence of cardiac pacemaker: Secondary | ICD-10-CM | POA: Diagnosis not present

## 2017-04-16 DIAGNOSIS — I739 Peripheral vascular disease, unspecified: Secondary | ICD-10-CM | POA: Diagnosis not present

## 2017-04-16 DIAGNOSIS — I4891 Unspecified atrial fibrillation: Secondary | ICD-10-CM | POA: Diagnosis not present

## 2017-04-16 DIAGNOSIS — G4733 Obstructive sleep apnea (adult) (pediatric): Secondary | ICD-10-CM | POA: Diagnosis not present

## 2017-04-16 DIAGNOSIS — I471 Supraventricular tachycardia: Secondary | ICD-10-CM | POA: Diagnosis not present

## 2017-04-16 DIAGNOSIS — Z79891 Long term (current) use of opiate analgesic: Secondary | ICD-10-CM | POA: Diagnosis not present

## 2017-04-19 DIAGNOSIS — Z79891 Long term (current) use of opiate analgesic: Secondary | ICD-10-CM | POA: Diagnosis not present

## 2017-04-19 DIAGNOSIS — I471 Supraventricular tachycardia: Secondary | ICD-10-CM | POA: Diagnosis not present

## 2017-04-19 DIAGNOSIS — G4733 Obstructive sleep apnea (adult) (pediatric): Secondary | ICD-10-CM | POA: Diagnosis not present

## 2017-04-19 DIAGNOSIS — Z950 Presence of cardiac pacemaker: Secondary | ICD-10-CM | POA: Diagnosis not present

## 2017-04-19 DIAGNOSIS — I10 Essential (primary) hypertension: Secondary | ICD-10-CM | POA: Diagnosis not present

## 2017-04-19 DIAGNOSIS — E785 Hyperlipidemia, unspecified: Secondary | ICD-10-CM | POA: Diagnosis not present

## 2017-04-19 DIAGNOSIS — I739 Peripheral vascular disease, unspecified: Secondary | ICD-10-CM | POA: Diagnosis not present

## 2017-04-19 DIAGNOSIS — E039 Hypothyroidism, unspecified: Secondary | ICD-10-CM | POA: Diagnosis not present

## 2017-04-19 DIAGNOSIS — I4891 Unspecified atrial fibrillation: Secondary | ICD-10-CM | POA: Diagnosis not present

## 2017-04-22 DIAGNOSIS — Z950 Presence of cardiac pacemaker: Secondary | ICD-10-CM | POA: Diagnosis not present

## 2017-04-22 DIAGNOSIS — E785 Hyperlipidemia, unspecified: Secondary | ICD-10-CM | POA: Diagnosis not present

## 2017-04-22 DIAGNOSIS — I471 Supraventricular tachycardia: Secondary | ICD-10-CM | POA: Diagnosis not present

## 2017-04-22 DIAGNOSIS — Z79891 Long term (current) use of opiate analgesic: Secondary | ICD-10-CM | POA: Diagnosis not present

## 2017-04-22 DIAGNOSIS — I4891 Unspecified atrial fibrillation: Secondary | ICD-10-CM | POA: Diagnosis not present

## 2017-04-22 DIAGNOSIS — I10 Essential (primary) hypertension: Secondary | ICD-10-CM | POA: Diagnosis not present

## 2017-04-22 DIAGNOSIS — G4733 Obstructive sleep apnea (adult) (pediatric): Secondary | ICD-10-CM | POA: Diagnosis not present

## 2017-04-22 DIAGNOSIS — I739 Peripheral vascular disease, unspecified: Secondary | ICD-10-CM | POA: Diagnosis not present

## 2017-04-22 DIAGNOSIS — E039 Hypothyroidism, unspecified: Secondary | ICD-10-CM | POA: Diagnosis not present

## 2017-04-28 DIAGNOSIS — R269 Unspecified abnormalities of gait and mobility: Secondary | ICD-10-CM | POA: Diagnosis not present

## 2017-04-28 DIAGNOSIS — R42 Dizziness and giddiness: Secondary | ICD-10-CM | POA: Diagnosis not present

## 2017-05-04 ENCOUNTER — Ambulatory Visit (INDEPENDENT_AMBULATORY_CARE_PROVIDER_SITE_OTHER): Payer: Medicare Other | Admitting: *Deleted

## 2017-05-04 ENCOUNTER — Telehealth: Payer: Self-pay | Admitting: Cardiology

## 2017-05-04 DIAGNOSIS — I495 Sick sinus syndrome: Secondary | ICD-10-CM | POA: Diagnosis not present

## 2017-05-04 NOTE — Progress Notes (Signed)
Remote pacemaker transmission.   

## 2017-05-04 NOTE — Telephone Encounter (Signed)
Spoke with pt and reminded pt of remote transmission that is due today. Pt verbalized understanding.   

## 2017-05-05 ENCOUNTER — Encounter: Payer: Self-pay | Admitting: Cardiology

## 2017-05-14 DIAGNOSIS — M542 Cervicalgia: Secondary | ICD-10-CM | POA: Diagnosis not present

## 2017-05-14 DIAGNOSIS — M170 Bilateral primary osteoarthritis of knee: Secondary | ICD-10-CM | POA: Diagnosis not present

## 2017-05-14 LAB — CUP PACEART REMOTE DEVICE CHECK
Battery Remaining Longevity: 62 mo
Battery Voltage: 3 V
Brady Statistic AP VP Percent: 1.55 %
Brady Statistic AP VS Percent: 55.24 %
Brady Statistic AS VP Percent: 6.46 %
Brady Statistic RA Percent Paced: 50.02 %
Date Time Interrogation Session: 20190212204448
Implantable Lead Location: 753860
Implantable Lead Model: 5076
Implantable Lead Model: 5076
Lead Channel Impedance Value: 323 Ohm
Lead Channel Impedance Value: 513 Ohm
Lead Channel Pacing Threshold Amplitude: 1.125 V
Lead Channel Pacing Threshold Pulse Width: 0.4 ms
Lead Channel Pacing Threshold Pulse Width: 0.4 ms
Lead Channel Sensing Intrinsic Amplitude: 0.75 mV
Lead Channel Sensing Intrinsic Amplitude: 0.75 mV
MDC IDC LEAD IMPLANT DT: 20150430
MDC IDC LEAD IMPLANT DT: 20150430
MDC IDC LEAD LOCATION: 753859
MDC IDC MSMT LEADCHNL RA IMPEDANCE VALUE: 399 Ohm
MDC IDC MSMT LEADCHNL RV IMPEDANCE VALUE: 570 Ohm
MDC IDC MSMT LEADCHNL RV PACING THRESHOLD AMPLITUDE: 0.75 V
MDC IDC MSMT LEADCHNL RV SENSING INTR AMPL: 30.75 mV
MDC IDC MSMT LEADCHNL RV SENSING INTR AMPL: 30.75 mV
MDC IDC PG IMPLANT DT: 20150430
MDC IDC SET LEADCHNL RA PACING AMPLITUDE: 2.25 V
MDC IDC SET LEADCHNL RV PACING AMPLITUDE: 2.5 V
MDC IDC SET LEADCHNL RV PACING PULSEWIDTH: 0.4 ms
MDC IDC SET LEADCHNL RV SENSING SENSITIVITY: 4 mV
MDC IDC STAT BRADY AS VS PERCENT: 36.74 %
MDC IDC STAT BRADY RV PERCENT PACED: 7.13 %

## 2017-05-15 DIAGNOSIS — M542 Cervicalgia: Secondary | ICD-10-CM | POA: Insufficient documentation

## 2017-05-26 ENCOUNTER — Other Ambulatory Visit: Payer: Self-pay | Admitting: Cardiovascular Disease

## 2017-05-27 ENCOUNTER — Ambulatory Visit: Payer: Medicare Other | Admitting: Cardiovascular Disease

## 2017-05-27 ENCOUNTER — Encounter: Payer: Self-pay | Admitting: Cardiovascular Disease

## 2017-05-27 VITALS — BP 167/93 | HR 84 | Ht 63.0 in | Wt 162.6 lb

## 2017-05-27 DIAGNOSIS — I422 Other hypertrophic cardiomyopathy: Secondary | ICD-10-CM | POA: Diagnosis not present

## 2017-05-27 DIAGNOSIS — Z79899 Other long term (current) drug therapy: Secondary | ICD-10-CM

## 2017-05-27 DIAGNOSIS — I10 Essential (primary) hypertension: Secondary | ICD-10-CM

## 2017-05-27 DIAGNOSIS — Z5181 Encounter for therapeutic drug level monitoring: Secondary | ICD-10-CM | POA: Diagnosis not present

## 2017-05-27 DIAGNOSIS — Z950 Presence of cardiac pacemaker: Secondary | ICD-10-CM | POA: Diagnosis not present

## 2017-05-27 DIAGNOSIS — I480 Paroxysmal atrial fibrillation: Secondary | ICD-10-CM

## 2017-05-27 DIAGNOSIS — I495 Sick sinus syndrome: Secondary | ICD-10-CM | POA: Diagnosis not present

## 2017-05-27 DIAGNOSIS — Z7901 Long term (current) use of anticoagulants: Secondary | ICD-10-CM | POA: Diagnosis not present

## 2017-05-27 MED ORDER — AMIODARONE HCL 200 MG PO TABS: 400.0000 mg | ORAL_TABLET | Freq: Every day | ORAL | 3 refills | Status: DC

## 2017-05-27 NOTE — H&P (View-Only) (Signed)
Cardiology Office Note    Date:  05/29/2017   ID:  [place], DOB December 29, 1928, MRN [id number]  PCP:  [place]ise Squibb, MD  Cardiologist:  [place], M.D.; [place]ise Klein, MD   Chief Complaint  Patient presents with  . Follow-up  Pacemaker check  History of Present Illness:  [place] is a 84 y.o. female with a history of sinus node dysfunction here for pacemaker follow-up. She is followed by Dr. Cla[place] for hypertension, hyperlipidemia, sleep apnea. She has moderate LVH with a configuration suggesting apical variant hypertrophic cardiomyopathy. She has previously had paroxysmal atrial fibrillation (2015) and takes amiodarone and Eliquis.  She has had some dizzy spells that have improved after she stopped taking losartan.  She saw [place]ise Pugh in early January.  Her blood pressure was borderline low at that time.  She was in atrial fibrillation.  She has not had any syncope or falls or serious bleeding problems.  She is fairly sedentary but she denies exertional angina or dyspnea.  She has not had lower extremity edema or claudication.  She is unaware of any palpitations.  She "just does not feel good many days".  Today is not a good day.  Pacemaker interrogation shows uninterrupted atrial fibrillation since late December.  Rate control is good, on the average in the 80s.  She has had little in the way of atrial fibrillation other than this long, persistent event.  Over the last year she has only had 13% atrial fibrillation, almost entirely in the last couple of months.    There is a striking correlation of her arrhythmia with a reduction in daily activity to less than half her previous average.  When not in atrial fibrillation she has atrial paced, ventricular sensed rhythm (87.5% atrial pacing, 2.6% ventricular pacing).  Prior to the atrial fibrillation, at her last check the underlying rhythm is severe sinus bradycardia in the 30s.  Her dual-chamber Medtronic Advisa pacemaker was  implanted in 2015 and still has roughly 5 years of expected longevity.  Current battery voltage is 2.99 V (RRT 2.85 V).  Lead parameters are all excellent (atrial pacing threshold cannot be checked).  Past Medical History:  Diagnosis Date  . Arrhythmia    HX of SVT. CARDIONET MONITOR 07/15/12 TO 08/13/12  . Arthritis   . GERD (gastroesophageal reflux disease)   . Hyperlipidemia 08/06/11   lexiscan myoview-normal. Due to severe attenuation the study specificity and sensitivty are deminished.  . Hypertension 08/25/10   ECHO-EF 60-65%  . Hypothyroidism   . Irregular heart beat   . Shortness of breath   . Sleep apnea    SLEEP STUDY-[facility]  . Thyroid disease     Past Surgical History:  Procedure Laterality Date  . CARDIAC CATHETERIZATION  07/06/01   spade-like configuration  . DILATION AND CURETTAGE OF UTERUS    . EYE SURGERY    . KNEE ARTHROSCOPY    . PERMANENT PACEMAKER INSERTION N/A 07/20/2013   Procedure: PERMANENT PACEMAKER INSERTION;  Surgeon: [place] Klein, MD;  Location: [facility] CATH [facility];  Service: Cardiovascular;  Laterality: N/A;    Current Medications: Outpatient Medications Prior to Visit  Medication Sig Dispense Refill  . acetaminophen (TYLENOL) 500 MG tablet Take 500 mg by mouth every 6 (six) hours as needed for moderate pain, fever or headache.     Marland Kitchen atorvastatin (LIPITOR) 20 MG tablet TAKE 1/2 TABLET BY MOUTH ONCE DAILY (Patient taking differently: TAKE 10 MG BY MOUTH ONCE  DAILY) 15 tablet 4  . Cholecalciferol (VITAMIN D) 2000 UNITS tablet Take 2,000 Units by mouth daily.    Marland Kitchen dexlansoprazole (DEXILANT) 60 MG capsule Take 60 mg by mouth daily.    Marland Kitchen docusate sodium (COLACE) 100 MG capsule Take 100 mg by mouth 2 (two) times daily.    Marland Kitchen ELIQUIS 5 MG TABS tablet TAKE 1 TABLET BY MOUTH TWO  TIMES DAILY (Patient taking differently: TAKE 5 MG BY MOUTH TWO TIMES DAILY) 180 tablet 1  . escitalopram (LEXAPRO) 5 MG tablet Take 5 mg by mouth daily.    .  hydrochlorothiazide (MICROZIDE) 12.5 MG capsule Take 1 capsule (12.5 mg total) by mouth daily. (Patient taking differently: Take 12.5 mg by mouth daily as needed (for swelling). ) 30 capsule 3  . levothyroxine (SYNTHROID, LEVOTHROID) 75 MCG tablet Take 75 mcg by mouth daily before breakfast.     . metoprolol tartrate (LOPRESSOR) 50 MG tablet Take 1 tablet (50 mg total) by mouth 2 (two) times daily. 180 tablet 3  . oxybutynin (DITROPAN) 5 MG tablet Take 5 mg by mouth 2 (two) times daily.     . traMADol (ULTRAM) 50 MG tablet Take 100 mg by mouth 2 (two) times daily.     Marland Kitchen amiodarone (PACERONE) 200 MG tablet Take 1 tablet (200 mg total) by mouth daily. 90 tablet 3   No facility-administered medications prior to visit.      Allergies:   Penicillins and Tetanus toxoid   Social History   Socioeconomic History  . Marital status: Married    Spouse name: None  . Number of children: None  . Years of education: None  . Highest education level: None  Social Needs  . Financial resource strain: None  . Food insecurity - worry: None  . Food insecurity - inability: None  . Transportation needs - medical: None  . Transportation needs - non-medical: None  Occupational History  . None  Tobacco Use  . Smoking status: Never Smoker  . Smokeless tobacco: Never Used  Substance and Sexual Activity  . Alcohol use: No    Alcohol/week: 0.0 oz  . Drug use: No  . Sexual activity: No  Other Topics Concern  . None  Social History Narrative  . None     Family History:  The patient's family history includes Diabetes in her father and mother; Heart attack in her maternal grandfather and maternal grandmother; Hypertension in her father.   ROS:   Please see the history of present illness.    ROS All other systems reviewed and are negative.   PHYSICAL EXAM:   VS:  BP (!) 167/93   Pulse 84   Ht 5\' 3"  (1.6 m)   Wt 162 lb 9.6 oz (73.8 kg)   BMI 28.80 kg/m     General: Alert, oriented x3, no distress,  healthy left subclavian pacemaker scar. Head: no evidence of trauma, PERRL, EOMI, no exophtalmos or lid lag, no myxedema, no xanthelasma; normal ears, nose and oropharynx Neck: normal jugular venous pulsations and no hepatojugular reflux; brisk carotid pulses without delay and no carotid bruits Chest: clear to auscultation, no signs of consolidation by percussion or palpation, normal fremitus, symmetrical and full respiratory excursions Cardiovascular: normal position and quality of the apical impulse, irregular rhythm, normal first and second heart sounds, no murmurs, rubs or gallops Abdomen: no tenderness or distention, no masses by palpation, no abnormal pulsatility or arterial bruits, normal bowel sounds, no hepatosplenomegaly Extremities: no clubbing, cyanosis or edema; 2+  radial, ulnar and brachial pulses bilaterally; 2+ right femoral, posterior tibial and dorsalis pedis pulses; 2+ left femoral, posterior tibial and dorsalis pedis pulses; no subclavian or femoral bruits Neurological: grossly nonfocal Psych: Normal mood and affect   Wt Readings from Last 3 Encounters:  05/27/17 162 lb 9.6 oz (73.8 kg)  03/25/17 165 lb (74.8 kg)  03/21/17 166 lb (75.3 kg)      Studies/Labs Reviewed:   EKG:  EKG is ordered today.  The ekg ordered January 3 demonstrates atrial fibrillation with a single ventricular paced beat, incomplete right bundle branch block (old), diffuse ST segment depression, not much change from previous tracings.  Recent Labs: 03/21/2017: ALT 29; BUN 15; Creatinine, Ser 0.94; Hemoglobin 14.6; Platelets 195; Potassium 3.4; Sodium 134   Lipid Panel    Component Value Date/Time   CHOL 120 07/19/2013 0539   TRIG 52 07/19/2013 0539   HDL 45 07/19/2013 0539   CHOLHDL 2.7 07/19/2013 0539   VLDL 10 07/19/2013 0539   LDLCALC 65 07/19/2013 0539    Additional studies/ records that were reviewed today include:  Notes from Dr. Claiborne P[place]    ASSESSMENT:    1. PAF (paroxysmal  atrial fibrillation) ([place])   2. Tachy-brady syndrome ([place])   3. Pacemaker   4. Hypertrophic cardiomyopathy ([place])   5. Encounter for monitoring amiodarone therapy   6. Long term (current) use of anticoagulants   7. Hypertension, essential      PLAN:  In order of problems listed above:  1. AFib: Rate is well controlled and she is fully anticoagulated. CHADSVasc 20 (age, gender, hypertension she is very careful with her medications and is confident she has not missed any doses of anticoagulant).  She appears to be symptomatic and I have recommended that she undergo a cardioversion. This procedure has been fully reviewed with the patient and informed consent has been obtained.  If her symptoms improve we should continue our efforts to keep her in sinus rhythm.  If she does not notice functional improvement after returning to normal rhythm, and I would recommend discontinuing the amiodarone due to its high side effect profile. That decision can be made at her follow-up visit with Dr. Cla[place] Pugh on April 3. 2. SSS: Overall the heart rate histogram appeared appropriate for her activity level, currently difficult to evaluate 2 atrial fibrillation.  Before the arrhythmia, she had virtually 100% atrial pacing and her underlying sinus rhythm was in the 30s. 3. PPM: Device function is normal.  Plan remote downloads every 3 months and yearly office visits. 4. Hypertrophic cardiomyopathy: Without evidence of outflow tract obstruction or ventricular arrhythmia, she may have apical variant 5. Amiodarone: Normal TSH in May 2018, will recheck with her pre-cardioversion labs.  Normal liver function test in January. 6. Anticoagulation: Well-tolerated without bleeding complications 7. HTN:  Losartan was stopped due to dizziness and low blood pressure in January, but today her blood pressure is actually quite high. She is only taking the hydrochlorothiazide as needed for edema.  We will reevaluate at the time of the  cardioversion    Medication Adjustments/Labs and Tests Ordered: Current medicines are reviewed at length with the patient today.  Concerns regarding medicines are outlined above.  Medication changes, Labs and Tests ordered today are listed in the Patient Instructions below. Patient Instructions  Dr Sallyanne Kuster has recommended making the following medication changes: 1. INCREASE Amiodarone to 400 mg daily  Please keep your appointment with Dr [place] in April.  Remote monitoring is used to monitor  your Pacemaker or ICD from home. This monitoring reduces the number of office visits required to check your device to one time per year. It allows [place] to keep an eye on the functioning of your device to ensure it is working properly. You are scheduled for a device check from home on Tuesday, May 14th, 2019. You may send your transmission at any time that day. If you have a wireless device, the transmission will be sent automatically. After your physician reviews your transmission, you will receive a notification with your next transmission date.  Dr Sallyanne Kuster recommends that you schedule a follow-up appointment in 12 months with a pacemaker check. You will receive a reminder letter in the mail two months in advance. If you don't receive a letter, please call our office to schedule the follow-up appointment.  If you need a refill on your cardiac medications before your next appointment, please call your pharmacy.   You are scheduled for a Cardioversion on Monday, March 18th, 2019 with Dr. Sallyanne Kuster.  Please arrive at the [facility] (Main Entrance A) at [facility]: [address] [place], [place] [address] at 9:00 am.  DIET: Nothing to eat or drink after midnight except a sip of water with medications (see medication instructions below)  Medication Instructions: Continue your anticoagulant: Eliquis  Labs: You will need lab work done within 1 week of the procedure. If we are convenient for you, we  have a lab station located within our office. Operating hours: 8:00a-4:30p. But you can take the lab slips to any lab you choose.  You must have a responsible person to drive you home and stay in the waiting area during your procedure. Failure to do so could result in cancellation.  Bring your insurance cards.  *Special Note: Every effort is made to have your procedure done on time. Occasionally there are emergencies that occur at the hospital that may cause delays. Please be patient if a delay does occur.     Signed, Denise Klein, MD  05/29/2017 12:54 PM    [address], [place], [place]  [address] Phone: [phone]; Fax: [phone]

## 2017-05-27 NOTE — Progress Notes (Signed)
Cardiology Office Note    Date:  05/29/2017   ID:  Denise Pugh, DOB 02/05/1929, MRN [id number]  PCP:  [name], MD  Cardiologist:  [name], M.D.; [name], MD   Chief Complaint  Patient presents with  . Follow-up  Pacemaker check  History of Present Illness:  Denise Pugh is a 84 y.o. female with a history of sinus node dysfunction here for pacemaker follow-up. She is followed by [name] for hypertension, hyperlipidemia, sleep apnea. She has moderate LVH with a configuration suggesting apical variant hypertrophic cardiomyopathy. She has previously had paroxysmal atrial fibrillation (2015) and takes amiodarone and Eliquis.  She has had some dizzy spells that have improved after she stopped taking losartan.  She saw Almyra Deforest in early January.  Her blood pressure was borderline low at that time.  She was in atrial fibrillation.  She has not had any syncope or falls or serious bleeding problems.  She is fairly sedentary but she denies exertional angina or dyspnea.  She has not had lower extremity edema or claudication.  She is unaware of any palpitations.  She "just does not feel good many days".  Today is not a good day.  Pacemaker interrogation shows uninterrupted atrial fibrillation since late December.  Rate control is good, on the average in the 80s.  She has had little in the way of atrial fibrillation other than this long, persistent event.  Over the last year she has only had 13% atrial fibrillation, almost entirely in the last couple of months.    There is a striking correlation of her arrhythmia with a reduction in daily activity to less than half her previous average.  When not in atrial fibrillation she has atrial paced, ventricular sensed rhythm (87.5% atrial pacing, 2.6% ventricular pacing).  Prior to the atrial fibrillation, at her last check the underlying rhythm is severe sinus bradycardia in the 30s.  Her dual-chamber Medtronic Advisa pacemaker was  implanted in 2015 and still has roughly 5 years of expected longevity.  Current battery voltage is 2.99 V (RRT 2.85 V).  Lead parameters are all excellent (atrial pacing threshold cannot be checked).  Past Medical History:  Diagnosis Date  . Arrhythmia    HX of SVT. CARDIONET MONITOR 07/15/12 TO 08/13/12  . Arthritis   . GERD (gastroesophageal reflux disease)   . Hyperlipidemia 08/06/11   lexiscan myoview-normal. Due to severe attenuation the study specificity and sensitivty are deminished.  . Hypertension 08/25/10   ECHO-EF 60-65%  . Hypothyroidism   . Irregular heart beat   . Shortness of breath   . Sleep apnea    SLEEP STUDY-[facility]  . Thyroid disease     Past Surgical History:  Procedure Laterality Date  . CARDIAC CATHETERIZATION  07/06/01   spade-like configuration  . DILATION AND CURETTAGE OF UTERUS    . EYE SURGERY    . KNEE ARTHROSCOPY    . PERMANENT PACEMAKER INSERTION N/A 07/20/2013   Procedure: PERMANENT PACEMAKER INSERTION;  Surgeon: [name], MD;  Location: [facility];  Service: Cardiovascular;  Laterality: N/A;    Current Medications: Outpatient Medications Prior to Visit  Medication Sig Dispense Refill  . acetaminophen (TYLENOL) 500 MG tablet Take 500 mg by mouth every 6 (six) hours as needed for moderate pain, fever or headache.     Marland Kitchen atorvastatin (LIPITOR) 20 MG tablet TAKE 1/2 TABLET BY MOUTH ONCE DAILY (Patient taking differently: TAKE 10 MG BY MOUTH ONCE  DAILY) 15 tablet 4  . Cholecalciferol (VITAMIN D) 2000 UNITS tablet Take 2,000 Units by mouth daily.    Marland Kitchen dexlansoprazole (DEXILANT) 60 MG capsule Take 60 mg by mouth daily.    Marland Kitchen docusate sodium (COLACE) 100 MG capsule Take 100 mg by mouth 2 (two) times daily.    Marland Kitchen ELIQUIS 5 MG TABS tablet TAKE 1 TABLET BY MOUTH TWO  TIMES DAILY (Patient taking differently: TAKE 5 MG BY MOUTH TWO TIMES DAILY) 180 tablet 1  . escitalopram (LEXAPRO) 5 MG tablet Take 5 mg by mouth daily.    .  hydrochlorothiazide (MICROZIDE) 12.5 MG capsule Take 1 capsule (12.5 mg total) by mouth daily. (Patient taking differently: Take 12.5 mg by mouth daily as needed (for swelling). ) 30 capsule 3  . levothyroxine (SYNTHROID, LEVOTHROID) 75 MCG tablet Take 75 mcg by mouth daily before breakfast.     . metoprolol tartrate (LOPRESSOR) 50 MG tablet Take 1 tablet (50 mg total) by mouth 2 (two) times daily. 180 tablet 3  . oxybutynin (DITROPAN) 5 MG tablet Take 5 mg by mouth 2 (two) times daily.     . traMADol (ULTRAM) 50 MG tablet Take 100 mg by mouth 2 (two) times daily.     Marland Kitchen amiodarone (PACERONE) 200 MG tablet Take 1 tablet (200 mg total) by mouth daily. 90 tablet 3   No facility-administered medications prior to visit.      Allergies:   Penicillins and Tetanus toxoid   Social History   Socioeconomic History  . Marital status: Married    Spouse name: None  . Number of children: None  . Years of education: None  . Highest education level: None  Social Needs  . Financial resource strain: None  . Food insecurity - worry: None  . Food insecurity - inability: None  . Transportation needs - medical: None  . Transportation needs - non-medical: None  Occupational History  . None  Tobacco Use  . Smoking status: Never Smoker  . Smokeless tobacco: Never Used  Substance and Sexual Activity  . Alcohol use: No    Alcohol/week: 0.0 oz  . Drug use: No  . Sexual activity: No  Other Topics Concern  . None  Social History Narrative  . None     Family History:  The patient's family history includes Diabetes in her father and mother; Heart attack in her maternal grandfather and maternal grandmother; Hypertension in her father.   ROS:   Please see the history of present illness.    ROS All other systems reviewed and are negative.   PHYSICAL EXAM:   VS:  BP (!) 167/93   Pulse 84   Ht 5\' 3"  (1.6 m)   Wt 162 lb 9.6 oz (73.8 kg)   BMI 28.80 kg/m     General: Alert, oriented x3, no distress,  healthy left subclavian pacemaker scar. Head: no evidence of trauma, PERRL, EOMI, no exophtalmos or lid lag, no myxedema, no xanthelasma; normal ears, nose and oropharynx Neck: normal jugular venous pulsations and no hepatojugular reflux; brisk carotid pulses without delay and no carotid bruits Chest: clear to auscultation, no signs of consolidation by percussion or palpation, normal fremitus, symmetrical and full respiratory excursions Cardiovascular: normal position and quality of the apical impulse, irregular rhythm, normal first and second heart sounds, no murmurs, rubs or gallops Abdomen: no tenderness or distention, no masses by palpation, no abnormal pulsatility or arterial bruits, normal bowel sounds, no hepatosplenomegaly Extremities: no clubbing, cyanosis or edema; 2+  radial, ulnar and brachial pulses bilaterally; 2+ right femoral, posterior tibial and dorsalis pedis pulses; 2+ left femoral, posterior tibial and dorsalis pedis pulses; no subclavian or femoral bruits Neurological: grossly nonfocal Psych: Normal mood and affect   Wt Readings from Last 3 Encounters:  05/27/17 162 lb 9.6 oz (73.8 kg)  03/25/17 165 lb (74.8 kg)  03/21/17 166 lb (75.3 kg)      Studies/Labs Reviewed:   EKG:  EKG is ordered today.  The ekg ordered January 3 demonstrates atrial fibrillation with a single ventricular paced beat, incomplete right bundle branch block (old), diffuse ST segment depression, not much change from previous tracings.  Recent Labs: 03/21/2017: ALT 29; BUN 15; Creatinine, Ser 0.94; Hemoglobin 14.6; Platelets 195; Potassium 3.4; Sodium 134   Lipid Panel    Component Value Date/Time   CHOL 120 07/19/2013 0539   TRIG 52 07/19/2013 0539   HDL 45 07/19/2013 0539   CHOLHDL 2.7 07/19/2013 0539   VLDL 10 07/19/2013 0539   LDLCALC 65 07/19/2013 0539    Additional studies/ records that were reviewed today include:  Notes from [name]    ASSESSMENT:    1. PAF (paroxysmal  atrial fibrillation) ([place])   2. Tachy-brady syndrome ([place])   3. Pacemaker   4. Hypertrophic cardiomyopathy ([place])   5. Encounter for monitoring amiodarone therapy   6. Long term (current) use of anticoagulants   7. Hypertension, essential      PLAN:  In order of problems listed above:  1. AFib: Rate is well controlled and she is fully anticoagulated. CHADSVasc 40 (age, gender, hypertension she is very careful with her medications and is confident she has not missed any doses of anticoagulant).  She appears to be symptomatic and I have recommended that she undergo a cardioversion. This procedure has been fully reviewed with the patient and informed consent has been obtained.  If her symptoms improve we should continue our efforts to keep her in sinus rhythm.  If she does not notice functional improvement after returning to normal rhythm, and I would recommend discontinuing the amiodarone due to its high side effect profile. That decision can be made at her follow-up visit with [name] on April 3. 2. SSS: Overall the heart rate histogram appeared appropriate for her activity level, currently difficult to evaluate 2 atrial fibrillation.  Before the arrhythmia, she had virtually 100% atrial pacing and her underlying sinus rhythm was in the 30s. 3. PPM: Device function is normal.  Plan remote downloads every 3 months and yearly office visits. 4. Hypertrophic cardiomyopathy: Without evidence of outflow tract obstruction or ventricular arrhythmia, she may have apical variant 5. Amiodarone: Normal TSH in May 2018, will recheck with her pre-cardioversion labs.  Normal liver function test in January. 6. Anticoagulation: Well-tolerated without bleeding complications 7. HTN:  Losartan was stopped due to dizziness and low blood pressure in January, but today her blood pressure is actually quite high. She is only taking the hydrochlorothiazide as needed for edema.  We will reevaluate at the time of the  cardioversion    Medication Adjustments/Labs and Tests Ordered: Current medicines are reviewed at length with the patient today.  Concerns regarding medicines are outlined above.  Medication changes, Labs and Tests ordered today are listed in the Patient Instructions below. Patient Instructions  Dr Sallyanne Kuster has recommended making the following medication changes: 1. INCREASE Amiodarone to 400 mg daily  Please keep your appointment with Dr Claiborne Billings in April.  Remote monitoring is used to monitor  your Pacemaker or ICD from home. This monitoring reduces the number of office visits required to check your device to one time per year. It allows [place] to keep an eye on the functioning of your device to ensure it is working properly. You are scheduled for a device check from home on Tuesday, May 14th, 2019. You may send your transmission at any time that day. If you have a wireless device, the transmission will be sent automatically. After your physician reviews your transmission, you will receive a notification with your next transmission date.  Dr Sallyanne Kuster recommends that you schedule a follow-up appointment in 12 months with a pacemaker check. You will receive a reminder letter in the mail two months in advance. If you don't receive a letter, please call our office to schedule the follow-up appointment.  If you need a refill on your cardiac medications before your next appointment, please call your pharmacy.   You are scheduled for a Cardioversion on Monday, March 18th, 2019 with Dr. Sallyanne Kuster.  Please arrive at the [facility] (Main Entrance A) at [facility]: [address] [place], [place] [address] at 9:00 am.  DIET: Nothing to eat or drink after midnight except a sip of water with medications (see medication instructions below)  Medication Instructions: Continue your anticoagulant: Eliquis  Labs: You will need lab work done within 1 week of the procedure. If we are convenient for you, we  have a lab station located within our office. Operating hours: 8:00a-4:30p. But you can take the lab slips to any lab you choose.  You must have a responsible person to drive you home and stay in the waiting area during your procedure. Failure to do so could result in cancellation.  Bring your insurance cards.  *Special Note: Every effort is made to have your procedure done on time. Occasionally there are emergencies that occur at the hospital that may cause delays. Please be patient if a delay does occur.     Signed, [name], MD  05/29/2017 12:54 PM    [facility], [place], [place]  [address] Phone: [phone]; Fax: [phone]

## 2017-05-27 NOTE — Patient Instructions (Addendum)
Dr Sallyanne Kuster has recommended making the following medication changes: 1. INCREASE Amiodarone to 400 mg daily  Please keep your appointment with Dr Claiborne Billings in April.  Remote monitoring is used to monitor your Pacemaker or ICD from home. This monitoring reduces the number of office visits required to check your device to one time per year. It allows [place] to keep an eye on the functioning of your device to ensure it is working properly. You are scheduled for a device check from home on Tuesday, May 14th, 2019. You may send your transmission at any time that day. If you have a wireless device, the transmission will be sent automatically. After your physician reviews your transmission, you will receive a notification with your next transmission date.  Dr Sallyanne Kuster recommends that you schedule a follow-up appointment in 12 months with a pacemaker check. You will receive a reminder letter in the mail two months in advance. If you don't receive a letter, please call our office to schedule the follow-up appointment.  If you need a refill on your cardiac medications before your next appointment, please call your pharmacy.   You are scheduled for a Cardioversion on Monday, March 18th, 2019 with Dr. Sallyanne Kuster.  Please arrive at the [facility] (Main Entrance A) at [facility]: [address] [place], [place] [address] at 9:00 am.  DIET: Nothing to eat or drink after midnight except a sip of water with medications (see medication instructions below)  Medication Instructions: Continue your anticoagulant: Eliquis  Labs: You will need lab work done within 1 week of the procedure. If we are convenient for you, we have a lab station located within our office. Operating hours: 8:00a-4:30p. But you can take the lab slips to any lab you choose.  You must have a responsible person to drive you home and stay in the waiting area during your procedure. Failure to do so could result in cancellation.  Bring your  insurance cards.  *Special Note: Every effort is made to have your procedure done on time. Occasionally there are emergencies that occur at the hospital that may cause delays. Please be patient if a delay does occur.

## 2017-06-01 DIAGNOSIS — I480 Paroxysmal atrial fibrillation: Secondary | ICD-10-CM | POA: Diagnosis not present

## 2017-06-02 LAB — CUP PACEART INCLINIC DEVICE CHECK
Implantable Lead Implant Date: 20150430
Implantable Lead Location: 753860
Implantable Lead Model: 5076
Implantable Lead Model: 5076
Implantable Pulse Generator Implant Date: 20150430
Lead Channel Setting Pacing Pulse Width: 0.4 ms
MDC IDC LEAD IMPLANT DT: 20150430
MDC IDC LEAD LOCATION: 753859
MDC IDC SESS DTM: 20190313133344
MDC IDC SET LEADCHNL RA PACING AMPLITUDE: 2.25 V
MDC IDC SET LEADCHNL RV PACING AMPLITUDE: 2.5 V
MDC IDC SET LEADCHNL RV SENSING SENSITIVITY: 4 mV

## 2017-06-02 LAB — CBC
HEMATOCRIT: 46.1 % (ref 34.0–46.6)
Hemoglobin: 14.6 g/dL (ref 11.1–15.9)
MCH: 28.8 pg (ref 26.6–33.0)
MCHC: 31.7 g/dL (ref 31.5–35.7)
MCV: 91 fL (ref 79–97)
Platelets: 187 10*3/uL (ref 150–379)
RBC: 5.07 x10E6/uL (ref 3.77–5.28)
RDW: 14.9 % (ref 12.3–15.4)
WBC: 6.6 10*3/uL (ref 3.4–10.8)

## 2017-06-02 LAB — BASIC METABOLIC PANEL
BUN/Creatinine Ratio: 15 (ref 12–28)
BUN: 20 mg/dL (ref 8–27)
CO2: 23 mmol/L (ref 20–29)
CREATININE: 1.36 mg/dL — AB (ref 0.57–1.00)
Calcium: 9 mg/dL (ref 8.7–10.3)
Chloride: 97 mmol/L (ref 96–106)
GFR calc Af Amer: 41 mL/min/{1.73_m2} — ABNORMAL LOW (ref >59)
GFR, EST NON AFRICAN AMERICAN: 35 mL/min/{1.73_m2} — AB (ref >59)
GLUCOSE: 82 mg/dL (ref 65–99)
Potassium: 5.2 mmol/L (ref 3.5–5.2)
SODIUM: 137 mmol/L (ref 134–144)

## 2017-06-02 LAB — TSH: TSH: 2.63 u[IU]/mL (ref 0.450–4.500)

## 2017-06-06 NOTE — Anesthesia Preprocedure Evaluation (Addendum)
Anesthesia Evaluation  Patient identified by MRN, date of birth, ID band Patient awake    Reviewed: Allergy & Precautions, NPO status , Patient's Chart, lab work & pertinent test results  Airway Mallampati: II  TM Distance: >3 FB Neck ROM: Full    Dental no notable dental hx.    Pulmonary neg pulmonary ROS,    Pulmonary exam normal breath sounds clear to auscultation       Cardiovascular hypertension, Normal cardiovascular exam+ dysrhythmias Atrial Fibrillation  Rhythm:Regular Rate:Normal     Neuro/Psych negative neurological ROS     GI/Hepatic   Endo/Other    Renal/GU      Musculoskeletal   Abdominal   Peds  Hematology negative hematology ROS (+)   Anesthesia Other Findings   Reproductive/Obstetrics                            Anesthesia Physical Anesthesia Plan  ASA: III  Anesthesia Plan: General   Post-op Pain Management:    Induction: Intravenous  PONV Risk Score and Plan:   Airway Management Planned: Mask  Additional Equipment:   Intra-op Plan:   Post-operative Plan:   Informed Consent: I have reviewed the patients History and Physical, chart, labs and discussed the procedure including the risks, benefits and alternatives for the proposed anesthesia with the patient or authorized representative who has indicated his/her understanding and acceptance.     Plan Discussed with: CRNA  Anesthesia Plan Comments:         Anesthesia Quick Evaluation

## 2017-06-07 ENCOUNTER — Encounter (HOSPITAL_COMMUNITY): Payer: Self-pay | Admitting: *Deleted

## 2017-06-07 ENCOUNTER — Ambulatory Visit (HOSPITAL_COMMUNITY): Payer: Medicare Other | Admitting: Anesthesiology

## 2017-06-07 ENCOUNTER — Encounter (HOSPITAL_COMMUNITY)
Admission: RE | Disposition: A | Discharge status: Home / Self Care | Payer: Self-pay | Source: Ambulatory Visit | Attending: Cardiovascular Disease

## 2017-06-07 ENCOUNTER — Ambulatory Visit (HOSPITAL_COMMUNITY)
Admission: RE | Admit: 2017-06-07 | Discharge: 2017-06-07 | Disposition: A | Discharge status: Home / Self Care | Payer: Medicare Other | Source: Ambulatory Visit | Attending: Cardiovascular Disease | Admitting: Cardiovascular Disease

## 2017-06-07 ENCOUNTER — Other Ambulatory Visit: Payer: Self-pay

## 2017-06-07 DIAGNOSIS — Z79890 Hormone replacement therapy (postmenopausal): Secondary | ICD-10-CM | POA: Diagnosis not present

## 2017-06-07 DIAGNOSIS — E785 Hyperlipidemia, unspecified: Secondary | ICD-10-CM | POA: Insufficient documentation

## 2017-06-07 DIAGNOSIS — I4891 Unspecified atrial fibrillation: Secondary | ICD-10-CM

## 2017-06-07 DIAGNOSIS — Z79899 Other long term (current) drug therapy: Secondary | ICD-10-CM | POA: Diagnosis not present

## 2017-06-07 DIAGNOSIS — I422 Other hypertrophic cardiomyopathy: Secondary | ICD-10-CM | POA: Insufficient documentation

## 2017-06-07 DIAGNOSIS — E039 Hypothyroidism, unspecified: Secondary | ICD-10-CM | POA: Insufficient documentation

## 2017-06-07 DIAGNOSIS — I10 Essential (primary) hypertension: Secondary | ICD-10-CM | POA: Insufficient documentation

## 2017-06-07 DIAGNOSIS — G4730 Sleep apnea, unspecified: Secondary | ICD-10-CM | POA: Diagnosis not present

## 2017-06-07 DIAGNOSIS — K219 Gastro-esophageal reflux disease without esophagitis: Secondary | ICD-10-CM | POA: Diagnosis not present

## 2017-06-07 DIAGNOSIS — I4819 Other persistent atrial fibrillation: Secondary | ICD-10-CM

## 2017-06-07 DIAGNOSIS — Z950 Presence of cardiac pacemaker: Secondary | ICD-10-CM | POA: Diagnosis not present

## 2017-06-07 DIAGNOSIS — Z7901 Long term (current) use of anticoagulants: Secondary | ICD-10-CM | POA: Insufficient documentation

## 2017-06-07 DIAGNOSIS — I480 Paroxysmal atrial fibrillation: Secondary | ICD-10-CM | POA: Insufficient documentation

## 2017-06-07 DIAGNOSIS — I481 Persistent atrial fibrillation: Secondary | ICD-10-CM

## 2017-06-07 DIAGNOSIS — I495 Sick sinus syndrome: Secondary | ICD-10-CM | POA: Diagnosis present

## 2017-06-07 HISTORY — PX: CARDIOVERSION: SHX1299

## 2017-06-07 SURGERY — CARDIOVERSION
Anesthesia: General

## 2017-06-07 MED ORDER — HYDROCORTISONE 1 % EX CREA: 1.0000 "application " | TOPICAL_CREAM | Freq: Three times a day (TID) | CUTANEOUS | Status: DC | PRN

## 2017-06-07 MED ORDER — SODIUM CHLORIDE 0.9 % IV SOLN
INTRAVENOUS | Status: DC
  Administered 2017-06-07: 09:00:00 via INTRAVENOUS

## 2017-06-07 MED ORDER — PROPOFOL 10 MG/ML IV BOLUS
INTRAVENOUS | Status: DC | PRN
  Administered 2017-06-07: 60 mg via INTRAVENOUS

## 2017-06-07 MED ORDER — LIDOCAINE 2% (20 MG/ML) 5 ML SYRINGE
INTRAMUSCULAR | Status: DC | PRN
  Administered 2017-06-07: 60 mg via INTRAVENOUS

## 2017-06-07 NOTE — Anesthesia Postprocedure Evaluation (Signed)
Anesthesia Post Note  Patient: Denise Pugh  Procedure(s) Performed: CARDIOVERSION (N/A )     Patient location during evaluation: Endoscopy Anesthesia Type: General Level of consciousness: awake and alert Pain management: pain level controlled Vital Signs Assessment: post-procedure vital signs reviewed and stable Respiratory status: spontaneous breathing, nonlabored ventilation, respiratory function stable and patient connected to nasal cannula oxygen Cardiovascular status: blood pressure returned to baseline and stable Postop Assessment: no apparent nausea or vomiting Anesthetic complications: no    Last Vitals:  Vitals:   06/07/17 0858 06/07/17 0955  BP: (!) 198/82 131/76  Pulse:  (!) 59  Resp: 14 16  Temp:  36.8 C  SpO2: 98% 91%    Last Pain:  Vitals:   06/07/17 0955  TempSrc: Oral                 Barnet Glasgow

## 2017-06-07 NOTE — Interval H&P Note (Signed)
History and Physical Interval Note:  06/07/2017 8:53 AM  Denise Pugh  has presented today for surgery, with the diagnosis of AFIB  The various methods of treatment have been discussed with the patient and family. After consideration of risks, benefits and other options for treatment, the patient has consented to  Procedure(s): CARDIOVERSION (N/A) as a surgical intervention .  The patient's history has been reviewed, patient examined, no change in status, stable for surgery.  I have reviewed the patient's chart and labs.  Questions were answered to the patient's satisfaction.     Atisha Hamidi

## 2017-06-07 NOTE — Transfer of Care (Signed)
Immediate Anesthesia Transfer of Care Note  Patient: Denise Pugh  Procedure(s) Performed: CARDIOVERSION (N/A )  Patient Location: Endoscopy Unit  Anesthesia Type:General  Level of Consciousness: awake, alert  and oriented  Airway & Oxygen Therapy: Patient Spontanous Breathing  Post-op Assessment: Report given to RN and Post -op Vital signs reviewed and stable  Post vital signs: Reviewed and stable  Last Vitals:  Vitals:   06/07/17 0858  BP: (!) 198/82  Resp: 14  SpO2: 98%    Last Pain:  Vitals:   06/07/17 0858  TempSrc: Oral         Complications: No apparent anesthesia complications

## 2017-06-07 NOTE — Op Note (Signed)
Procedure: Electrical Cardioversion Indications:  Atrial Fibrillation  Procedure Details:  Consent: Risks of procedure as well as the alternatives and risks of each were explained to the (patient/caregiver).  Consent for procedure obtained.  Time Out: Verified patient identification, verified procedure, site/side was marked, verified correct patient position, special equipment/implants available, medications/allergies/relevent history reviewed, required imaging and test results available.  Performed  Patient placed on cardiac monitor, pulse oximetry, supplemental oxygen as necessary.  Sedation given: propofol 60 mg IV, Dr. Valma Cava Pacer pads placed anterior and posterior chest.  Cardioverted 1 time(s).  Cardioversion with synchronized biphasic 120J shock.  Evaluation: Findings: Post procedure EKG shows: A paced V sensed rhythm Complications: None Patient did tolerate procedure well.  Normal pacemaker check after the procedure.  Time Spent Directly with the Patient:  30 minutes   Denise Pugh 06/07/2017, 10:06 AM

## 2017-06-07 NOTE — Anesthesia Procedure Notes (Signed)
Procedure Name: General with mask airway Date/Time: 06/07/2017 9:40 AM Performed by: Wilburn Cornelia, CRNA Pre-anesthesia Checklist: Patient identified, Emergency Drugs available, Suction available, Patient being monitored and Timeout performed Patient Re-evaluated:Patient Re-evaluated prior to induction Oxygen Delivery Method: Ambu bag Preoxygenation: Pre-oxygenation with 100% oxygen Induction Type: IV induction Ventilation: Mask ventilation without difficulty Placement Confirmation: breath sounds checked- equal and bilateral Dental Injury: Teeth and Oropharynx as per pre-operative assessment

## 2017-06-08 ENCOUNTER — Telehealth: Payer: Self-pay | Admitting: Cardiovascular Disease

## 2017-06-08 NOTE — Telephone Encounter (Signed)
Patient called w/MD advice. Med list updated

## 2017-06-08 NOTE — Telephone Encounter (Signed)
Denise Pugh is calling to find out if she should take the Hydrochlorothiazide. Please call

## 2017-06-08 NOTE — Telephone Encounter (Signed)
Use ONLY as needed, not daily. Please make sure med list is also PRN only Thanks

## 2017-06-08 NOTE — Telephone Encounter (Signed)
Returned call to patient. She states she was previously advised to use hctz PRN swelling by [facility] in Jan, however she received a discharge summary from hospital yesterday and it states to take daily. She would like MD advice on what she should do.   Will route to Dr. Sallyanne Kuster and [facility] CMA

## 2017-06-10 DIAGNOSIS — I482 Chronic atrial fibrillation: Secondary | ICD-10-CM | POA: Diagnosis not present

## 2017-06-10 DIAGNOSIS — R944 Abnormal results of kidney function studies: Secondary | ICD-10-CM | POA: Diagnosis not present

## 2017-06-10 DIAGNOSIS — B372 Candidiasis of skin and nail: Secondary | ICD-10-CM | POA: Diagnosis not present

## 2017-06-18 ENCOUNTER — Other Ambulatory Visit: Payer: Self-pay

## 2017-06-18 ENCOUNTER — Ambulatory Visit (HOSPITAL_COMMUNITY): Payer: Medicare Other | Attending: Cardiovascular Disease

## 2017-06-18 DIAGNOSIS — I083 Combined rheumatic disorders of mitral, aortic and tricuspid valves: Secondary | ICD-10-CM | POA: Diagnosis not present

## 2017-06-18 DIAGNOSIS — I10 Essential (primary) hypertension: Secondary | ICD-10-CM | POA: Diagnosis not present

## 2017-06-18 DIAGNOSIS — I272 Pulmonary hypertension, unspecified: Secondary | ICD-10-CM | POA: Insufficient documentation

## 2017-06-18 DIAGNOSIS — R011 Cardiac murmur, unspecified: Secondary | ICD-10-CM | POA: Diagnosis not present

## 2017-06-18 DIAGNOSIS — I4891 Unspecified atrial fibrillation: Secondary | ICD-10-CM | POA: Insufficient documentation

## 2017-06-18 DIAGNOSIS — I358 Other nonrheumatic aortic valve disorders: Secondary | ICD-10-CM

## 2017-06-18 DIAGNOSIS — E785 Hyperlipidemia, unspecified: Secondary | ICD-10-CM | POA: Diagnosis not present

## 2017-06-23 ENCOUNTER — Encounter: Payer: Self-pay | Admitting: Cardiovascular Disease

## 2017-06-23 ENCOUNTER — Ambulatory Visit: Payer: Medicare Other | Admitting: Cardiovascular Disease

## 2017-06-23 VITALS — BP 152/78 | HR 69 | Ht 63.0 in | Wt 164.0 lb

## 2017-06-23 DIAGNOSIS — I10 Essential (primary) hypertension: Secondary | ICD-10-CM

## 2017-06-23 DIAGNOSIS — Z950 Presence of cardiac pacemaker: Secondary | ICD-10-CM | POA: Diagnosis not present

## 2017-06-23 DIAGNOSIS — Z7901 Long term (current) use of anticoagulants: Secondary | ICD-10-CM | POA: Diagnosis not present

## 2017-06-23 DIAGNOSIS — I480 Paroxysmal atrial fibrillation: Secondary | ICD-10-CM | POA: Diagnosis not present

## 2017-06-23 DIAGNOSIS — E039 Hypothyroidism, unspecified: Secondary | ICD-10-CM

## 2017-06-23 MED ORDER — AMIODARONE HCL 200 MG PO TABS: 300.0000 mg | ORAL_TABLET | Freq: Every day | ORAL | 3 refills | Status: DC

## 2017-06-23 NOTE — Patient Instructions (Signed)
Medication Instructions:  April 18th-Decrease amiodarone to 300 mg daily  Follow-Up: June with [name]  Any Other Special Instructions Will Be Listed Below (If Applicable).     If you need a refill on your cardiac medications before your next appointment, please call your pharmacy.

## 2017-06-24 NOTE — Progress Notes (Signed)
Patient ID: Denise Pugh, female   DOB: November 16, 1928, 84 y.o.   MRN: [id number]     HPI: Denise Pugh is a 84 y.o. female who presents to the office today for a 6 month followup cardiology evaluation.  [name] has a history of SVTand moderate left ventricular hypertrophy with documented "spade-like ventricle."  She has previously documented diffuse T-wave abnormalities. A stress test in May 2013 showed breast attenuation artifact with a post stress ejection fraction of 70% without evidence for scar or ischemia. She had experienced recurrent episodes of palpitations. A cardiac monitor revealed episodes of SVT up to 185 beats per minute and her beta blocker dose was increased.  A CardioNet monitor on her increased dose of Toprol-XL 25 mg still revealed bursts of atrial tachycardia/A flutter rate at 189 beats per minute.  On  May 17 she had an episode of tachycardia palpitations and did also have episodes of bradycardia; some of her spells suggested SVT at 150  in addition to episode of accelerated idioventricular rhythm when she was sinus bradycardic.  A 2-D echo Doppler study on 09/15/2012 showed an ejection fraction of 93-57%,SVXBL 1 diastolic dysfunction and elevated LV filling pressures.  The aortic valve was as sclerotic without stenosis and there was mild/moderate central regurgitation. 10 mild mitral regurgitation and mild tricuspid regurgitation with mild pulmonary hypertension with a PA estimate pressure at 47 mm.  In April 2015, she was admitted to [facility] hospital with atrial fibrillation with a rapid ventricular response.  She also had a CardioNet monitor which had shown episodes of sinus bradycardia, as well as PAF, with rates up to 170 beats per minute with also questionable nonsustained VT versus actual fibrillation with aberrancy.  She was started on amiodarone. Her Eliquis was held, and she underwent permanent pacemaker insertion.  She was seen in August 2015 by Dr. Sallyanne Pugh for  pacemaker follow-up.  Her underlying rhythm was sinus bradycardia at 38 bpm, and she was pacing the atrium greater than 99% of the time without hardly any episodes of ventricular pacing.  She recently saw Dr. Sallyanne Pugh for one-year evaluation in October 2016.  She has both sinus node dysfunction and AV conduction abnormalities.  Her pacemaker is programmed with MVP on and she has a very long AV conduction time so as to prevent ventricular pacing.  Since I last saw her, she underwent a pacemaker evaluation with Dr. Sallyanne Pugh was in October 2017.   She has been undergoing 3 month interval device checks.  She was atrially paced and ventricular sensing with a long AV delay.  Estimated longevity is 6.5 years.  There is not been any detected atrial fibrillation or ventricular tachycardia.   Se has had difficulty with low back discomfort making it difficult to walk.  She has been undergoing physical therapy.  She also received injections and also had acupuncture.  She admits to purposeful weight loss over the last year and has lost approximately 20 pounds.  She denies any episodes of chest pain.  There have been no episodes of recurrent atrial fibrillation.  She continues to be on amiodarone 200 mg daily and takes eliquis 5 mg twice a day for anticoagulation.  She continues to be on losartan HCT and metoprolol for hypertension.    Since I last saw her in October 2018, she was seen by Dr. Sallyanne Pugh early March.  Pacemaker interrogation showed uninterrupted atrial fibrillation since late December 2018.  Rate control was good averaging in the 80s.  She had  noticed more shortness of breath and was symptomatic with AF and had been compliant with anticoagulation she underwent cardioversion on June 07, 2017 successfully.  Postprocedure ECG showed atrially paced and ventricular sensed rhythm.  She has not noticed any major change in her symptoms following the cardioversion.  She denies chest pain.  She denies palpitations.   She has been maintained on amiodarone 400 mg daily in addition to metoprolol 50 mg twice a day.  She is on Eliquis 5 mg twice a day.  She continues with atorvastatin for hyperlipidemia.  She has had some issues with osteoarthritis.  She presents for follow-up evaluation.   Past Medical History:  Diagnosis Date  . Arrhythmia    HX of SVT. CARDIONET MONITOR 07/15/12 TO 08/13/12  . Arthritis   . GERD (gastroesophageal reflux disease)   . Hyperlipidemia 08/06/11   lexiscan myoview-normal. Due to severe attenuation the study specificity and sensitivty are deminished.  . Hypertension 08/25/10   ECHO-EF 60-65%  . Hypothyroidism   . Irregular heart beat   . Shortness of breath   . Sleep apnea    SLEEP STUDY-[facility]  . Thyroid disease     Past Surgical History:  Procedure Laterality Date  . CARDIAC CATHETERIZATION  07/06/01   spade-like configuration  . CARDIOVERSION N/A 06/07/2017   Procedure: CARDIOVERSION;  Surgeon: Denise Klein, MD;  Location: [facility];  Service: Cardiovascular;  Laterality: N/A;  . DILATION AND CURETTAGE OF UTERUS    . EYE SURGERY    . KNEE ARTHROSCOPY    . PERMANENT PACEMAKER INSERTION N/A 07/20/2013   Procedure: PERMANENT PACEMAKER INSERTION;  Surgeon: Denise Klein, MD;  Location: [facility];  Service: Cardiovascular;  Laterality: N/A;    Allergies  Allergen Reactions  . Penicillins Other (See Comments)    Has patient had a PCN reaction causing immediate rash, facial/tongue/throat swelling, SOB or lightheadedness with hypotension: Unknown Has patient had a PCN reaction causing severe rash involving mucus membranes or skin necrosis: Unknown Has patient had a PCN reaction that required hospitalization: Unknown Has patient had a PCN reaction occurring within the last 10 years: Unknown If all of the above answers are "NO", then may proceed with Cephalosporin use.   . Tetanus Toxoid Swelling    Current Outpatient Medications    Medication Sig Dispense Refill  . acetaminophen (TYLENOL) 500 MG tablet Take 500 mg by mouth every 6 (six) hours as needed for moderate pain, fever or headache.     Marland Kitchen amiodarone (PACERONE) 200 MG tablet Take 1.5 tablets (300 mg total) by mouth daily. 90 tablet 3  . atorvastatin (LIPITOR) 20 MG tablet TAKE 1/2 TABLET BY MOUTH ONCE DAILY (Patient taking differently: TAKE 10 MG BY MOUTH ONCE DAILY) 15 tablet 4  . Cholecalciferol (VITAMIN D) 2000 UNITS tablet Take 2,000 Units by mouth daily.    Marland Kitchen dexlansoprazole (DEXILANT) 60 MG capsule Take 60 mg by mouth daily.    Marland Kitchen docusate sodium (COLACE) 100 MG capsule Take 100 mg by mouth 2 (two) times daily.    Marland Kitchen ELIQUIS 5 MG TABS tablet TAKE 1 TABLET BY MOUTH TWO  TIMES DAILY (Patient taking differently: TAKE 5 MG BY MOUTH TWO TIMES DAILY) 180 tablet 1  . escitalopram (LEXAPRO) 5 MG tablet Take 5 mg by mouth daily.    . hydrochlorothiazide (MICROZIDE) 12.5 MG capsule Take 12.5 mg by mouth daily as needed (swelling).     Marland Kitchen levothyroxine (SYNTHROID, LEVOTHROID) 75 MCG tablet Take 75 mcg by  mouth daily before breakfast.     . Liniments (DEEP BLUE RELIEF EX) Apply 1 application topically daily as needed (for back, shoulder and knee pain).    . metoprolol tartrate (LOPRESSOR) 50 MG tablet Take 1 tablet (50 mg total) by mouth 2 (two) times daily. 180 tablet 3  . mupirocin ointment (BACTROBAN) 2 % Place 1 application into the nose daily as needed (for dry nose).    . Naphazoline-Pheniramine (OPCON-A OP) Place 1 drop into both eyes daily as needed (for dry eyes).    Marland Kitchen OVER THE COUNTER MEDICATION Place 1 drop into both ears daily. Organic Ear Oil    . oxybutynin (DITROPAN) 5 MG tablet Take 5 mg by mouth 2 (two) times daily.     . sodium chloride (OCEAN) 0.65 % SOLN nasal spray Place 1 spray into both nostrils as needed for congestion.    . traMADol (ULTRAM) 50 MG tablet Take 100 mg by mouth 2 (two) times daily.      No current facility-administered medications for  this visit.    Social history is notable in that she is now. Her husband did suffer a CVA and has been having more difficulty at home. She has one child, 2 step sons, one grandchild and 2 great-grandchildren. There is no tobacco or alcohol use. She does admit to frequent anxiety.  ROS General: Negative; No fevers, chills, or night sweats; positive for fatigue HEENT: Negative; No changes in vision or hearing, sinus congestion, difficulty swallowing Pulmonary: Negative; No cough, wheezing, shortness of breath, hemoptysis Cardiovascular: see HPI GI: Negative; No nausea, vomiting, diarrhea, or abdominal pain GU: Negative; No dysuria, hematuria, or difficulty voiding Musculoskeletal: Positive for low back pain and osteoarthritis Hematologic/Oncology: Negative; no easy bruising, bleeding Endocrine: Negative; no heat/cold intolerance; no diabetes Neuro: Negative; no changes in balance, headaches Skin: Negative; No rashes or skin lesions Psychiatric: Negative; No behavioral problems, depression Sleep: Negative; No snoring, daytime sleepiness, hypersomnolence, bruxism, restless legs, hypnogognic hallucinations, no cataplexy Other comprehensive 14 point system review is negative.   PE BP (!) 152/78   Pulse 69   Ht '5\' 3"'  (1.6 m)   Wt 164 lb (74.4 kg)   BMI 29.05 kg/m    Repeat blood pressure was 154/78 supine and 132/76 standing.  Wt Readings from Last 3 Encounters:  06/23/17 164 lb (74.4 kg)  06/07/17 162 lb (73.5 kg)  05/27/17 162 lb 9.6 oz (73.8 kg)  November 2016:  Weight 181 pounds.  General: Alert, oriented, no distress.  Skin: normal turgor, no rashes, warm and dry HEENT: Normocephalic, atraumatic. Pupils equal round and reactive to light; sclera anicteric; extraocular muscles intact;  Nose without nasal septal hypertrophy Mouth/Parynx benign; Mallinpatti scale 3 Neck: No JVD, no carotid bruits; normal carotid upstroke Lungs: clear to ausculatation and percussion; no wheezing  or rales Chest wall: without tenderness to palpitation Heart: PMI not displaced, RRR, s1 s2 normal, 1/6 systolic murmur, no diastolic murmur, no rubs, gallops, thrills, or heaves Abdomen: soft, nontender; no hepatosplenomehaly, BS+; abdominal aorta nontender and not dilated by palpation. Back: no CVA tenderness Pulses 2+ Musculoskeletal: full range of motion, normal strength, no joint deformities Extremities: no clubbing cyanosis or edema, Homan's sign negative  Neurologic: grossly nonfocal; Cranial nerves grossly wnl Psychologic: Normal mood and affect  ECG (independently read by me): Atrially paced with prolonged AV conduction with a PR interval at 276.  Ventricular rate 63.  Anterolateral T wave changes  October 2018 ECG (independently read by me): Atrially paced rhythm  at 69 bpm.  Prolonged AV conduction at 298 ms.  ST-T abnormality anterolaterally.  January 2018 ECG (independently read by me): Atrial paced rhythm at 68 bpm.  Prolonged AV conduction at 300 ms. ,  Incomplete right bundle-branch block Anterolateral ST-T changes  April 2017 ECG (independently read by me): Sinus rhythm with markedly prolonged AV interval at ~ 440 msec.  Right bundle branch block  November 2016 ECG (independently read by me): 100% atrial pacing with a markedly prolonged PR interval at 480 ms.  Ventricular sensing.  Incomplete right bundle branch block.  ECG (independently read by me): Atrial paced rhythm at 86 bpm with prolonged PR segment.  T-wave inversion  December 2015 ECG (independently read by me): Atrially paced rhythm with ventricular sensing with incomplete right bundle branch block.  Previously noted T-wave abnormalities  07/24/2013 ECG: Atrial lead paced rhythm.  Mild RV conduction delay.  Diffuse T-wave inversion has been present previously  Prior ECG today  (independently read by me): Bradycardia at 48 beats per minute. RV conduction delay. Previously noted diffuse inferior and anterolateral T  wave inversion  Prior ECG of 01/20/2013: sinus bradycardia at 55 beats per minute. Previously noted diffuse T-wave inversion in leads 82F, V2 through V6. Mild RV conduction delay.  LABS: BMP Latest Ref Rng & Units 06/01/2017 03/21/2017 05/08/2014  Glucose 65 - 99 mg/dL 82 90 84  BUN 8 - 27 mg/dL '20 15 23  ' Creatinine 0.57 - 1.00 mg/dL 1.36(H) 0.94 0.98  BUN/Creat Ratio 12 - 28 15 - -  Sodium 134 - 144 mmol/L 137 134(L) 135  Potassium 3.5 - 5.2 mmol/L 5.2 3.4(L) 4.9  Chloride 96 - 106 mmol/L 97 96(L) 95(L)  CO2 20 - 29 mmol/L 23 24 33(H)  Calcium 8.7 - 10.3 mg/dL 9.0 9.1 8.9   Hepatic Function Latest Ref Rng & Units 03/21/2017 07/18/2013 08/10/2012  Total Protein 6.5 - 8.1 g/dL 7.8 7.0 6.8  Albumin 3.5 - 5.0 g/dL 4.0 3.6 4.1  AST 15 - 41 U/L 34 26 17  ALT 14 - 54 U/L '29 31 16  ' Alk Phosphatase 38 - 126 U/L 95 119(H) 85  Total Bilirubin 0.3 - 1.2 mg/dL 0.9 0.3 0.4   CBC Latest Ref Rng & Units 06/01/2017 03/21/2017 05/08/2014  WBC 3.4 - 10.8 x10E3/uL 6.6 8.7 6.2  Hemoglobin 11.1 - 15.9 g/dL 14.6 14.6 14.4  Hematocrit 34.0 - 46.6 % 46.1 43.8 43.5  Platelets 150 - 379 x10E3/uL 187 195 210   Lab Results  Component Value Date   MCV 91 06/01/2017   MCV 91.4 03/21/2017   MCV 92.0 05/08/2014   Lab Results  Component Value Date   TSH 2.630 06/01/2017   Lipid Panel     Component Value Date/Time   CHOL 120 07/19/2013 0539   TRIG 52 07/19/2013 0539   HDL 45 07/19/2013 0539   CHOLHDL 2.7 07/19/2013 0539   VLDL 10 07/19/2013 0539   LDLCALC 65 07/19/2013 0539   RADIOLOGY: No results found.  IMPRESSION:  1. Paroxysmal atrial fibrillation (HCC)   2. Long term (current) use of anticoagulants   3. Pacemaker   4. Hypertension, essential   5. Hypothyroidism, unspecified type     ASSESSMENT AND PLAN:   Ms. Castilla is an 84 year old Caucasian female who has a history of SVT and documented moderate left ventricle hypertrophy the with a "spade-like ventricle."  She has diffuse T-wave  abnormalities which have been present for years. In 2003 cardiac catheterization did not demonstrate coronary obstructive  disease but she had mild midsystolic bridging not felt to be significant in her LAD territory.  She has a history of SVT, PAF, and had developed bradycardia arrhythmia resulting in her permanent pacemaker implantation in 2015.  She recently was demonstrated to have persistent atrial fibrillation.  She has a cha2ds2vasc score of 4.  She underwent SL cardioversion on June 07, 2017.  He denies any major change in previous symptomatology despite being in normal sinus rhythm.  I have recommended that on April 1:18 month of amiodarone that she reduce her dose from 400 mg to 300 mg daily.  She does not have any significant edema on exam today and has not been taking HCTZ.  She has a history of hypothyroidism on levothyroxine 75 mcg.  Recent TSH was normal she is on metoprolol 50 mg twice a day.  Blood pressure was mildly increased today.  This will need to be monitored closely and if this continues to be elevated medication titration will be necessary.  Recent lipid studies done by her primary physician in November are stable on her current dose of atorvastatin.  I will see her in 2 months for reevaluation.  Time spent: 25 minutes  Troy Sine M.D., [facility] 06/25/2017 7:29 AM

## 2017-06-25 ENCOUNTER — Encounter: Payer: Self-pay | Admitting: Cardiovascular Disease

## 2017-07-09 ENCOUNTER — Other Ambulatory Visit (HOSPITAL_COMMUNITY): Payer: Self-pay

## 2017-07-22 ENCOUNTER — Other Ambulatory Visit: Payer: Self-pay | Admitting: Cardiovascular Disease

## 2017-07-22 NOTE — Telephone Encounter (Signed)
REFILL 

## 2017-07-27 DIAGNOSIS — E782 Mixed hyperlipidemia: Secondary | ICD-10-CM | POA: Diagnosis not present

## 2017-07-27 DIAGNOSIS — E039 Hypothyroidism, unspecified: Secondary | ICD-10-CM | POA: Diagnosis not present

## 2017-07-27 DIAGNOSIS — I2510 Atherosclerotic heart disease of native coronary artery without angina pectoris: Secondary | ICD-10-CM | POA: Diagnosis not present

## 2017-07-27 DIAGNOSIS — E119 Type 2 diabetes mellitus without complications: Secondary | ICD-10-CM | POA: Diagnosis not present

## 2017-07-29 DIAGNOSIS — E559 Vitamin D deficiency, unspecified: Secondary | ICD-10-CM | POA: Diagnosis not present

## 2017-07-29 DIAGNOSIS — M1991 Primary osteoarthritis, unspecified site: Secondary | ICD-10-CM | POA: Diagnosis not present

## 2017-07-29 DIAGNOSIS — K219 Gastro-esophageal reflux disease without esophagitis: Secondary | ICD-10-CM | POA: Diagnosis not present

## 2017-07-29 DIAGNOSIS — I482 Chronic atrial fibrillation: Secondary | ICD-10-CM | POA: Diagnosis not present

## 2017-08-03 ENCOUNTER — Ambulatory Visit (INDEPENDENT_AMBULATORY_CARE_PROVIDER_SITE_OTHER): Payer: Medicare Other | Admitting: *Deleted

## 2017-08-03 DIAGNOSIS — I495 Sick sinus syndrome: Secondary | ICD-10-CM | POA: Diagnosis not present

## 2017-08-04 NOTE — Progress Notes (Signed)
Remote pacemaker transmission.   

## 2017-08-05 LAB — CUP PACEART REMOTE DEVICE CHECK
Battery Remaining Longevity: 71 mo
Brady Statistic AP VP Percent: 0.3 %
Brady Statistic AP VS Percent: 99.67 %
Brady Statistic AS VP Percent: 0.02 %
Brady Statistic AS VS Percent: 0.01 %
Brady Statistic RV Percent Paced: 0.31 %
Date Time Interrogation Session: 20190514172959
Implantable Lead Implant Date: 20150430
Implantable Lead Implant Date: 20150430
Implantable Lead Model: 5076
Lead Channel Impedance Value: 513 Ohm
Lead Channel Impedance Value: 589 Ohm
Lead Channel Pacing Threshold Amplitude: 0.75 V
Lead Channel Pacing Threshold Amplitude: 1.125 V
Lead Channel Sensing Intrinsic Amplitude: 1.5 mV
Lead Channel Sensing Intrinsic Amplitude: 31.625 mV
Lead Channel Sensing Intrinsic Amplitude: 31.625 mV
Lead Channel Setting Sensing Sensitivity: 4 mV
MDC IDC LEAD LOCATION: 753859
MDC IDC LEAD LOCATION: 753860
MDC IDC MSMT BATTERY VOLTAGE: 3 V
MDC IDC MSMT LEADCHNL RA IMPEDANCE VALUE: 380 Ohm
MDC IDC MSMT LEADCHNL RA IMPEDANCE VALUE: 456 Ohm
MDC IDC MSMT LEADCHNL RA PACING THRESHOLD PULSEWIDTH: 0.4 ms
MDC IDC MSMT LEADCHNL RA SENSING INTR AMPL: 1.5 mV
MDC IDC MSMT LEADCHNL RV PACING THRESHOLD PULSEWIDTH: 0.4 ms
MDC IDC PG IMPLANT DT: 20150430
MDC IDC SET LEADCHNL RA PACING AMPLITUDE: 2.5 V
MDC IDC SET LEADCHNL RV PACING AMPLITUDE: 2.5 V
MDC IDC SET LEADCHNL RV PACING PULSEWIDTH: 0.4 ms
MDC IDC STAT BRADY RA PERCENT PACED: 99.97 %

## 2017-08-09 ENCOUNTER — Telehealth: Payer: Self-pay | Admitting: Cardiovascular Disease

## 2017-08-09 NOTE — Telephone Encounter (Signed)
Raquel Pharm D reviewed and no contraindication with medications. Advised patient

## 2017-08-09 NOTE — Telephone Encounter (Signed)
New Message:        Pt is calling to see if some shots in her eye to her retina would interfere with any of her heart medication. Pt states she has an appt on tomorrow.

## 2017-08-10 DIAGNOSIS — H353122 Nonexudative age-related macular degeneration, left eye, intermediate dry stage: Secondary | ICD-10-CM | POA: Diagnosis not present

## 2017-08-10 DIAGNOSIS — H353112 Nonexudative age-related macular degeneration, right eye, intermediate dry stage: Secondary | ICD-10-CM | POA: Diagnosis not present

## 2017-08-17 ENCOUNTER — Telehealth: Payer: Self-pay | Admitting: Cardiovascular Disease

## 2017-08-17 MED ORDER — ATORVASTATIN CALCIUM 20 MG PO TABS: 10.0000 mg | ORAL_TABLET | Freq: Every day | ORAL | 3 refills | Status: DC

## 2017-08-17 NOTE — Telephone Encounter (Signed)
Patient requested Refill on Lipitor be sent to [facility]; rx sent and patient notified directly.

## 2017-08-17 NOTE — Telephone Encounter (Signed)
New Message     *STAT* If patient is at the pharmacy, call can be transferred to refill team.   1. Which medications need to be refilled? (please list name of each medication and dose if known) atorvastatin (LIPITOR) 20 MG tablet  2. Which pharmacy/location (including street and city if local pharmacy) is medication to be sent to? [facility]   3. Do they need a 30 day or 90 day supply? [place]

## 2017-09-14 ENCOUNTER — Ambulatory Visit: Payer: Medicare Other | Admitting: Cardiovascular Disease

## 2017-09-14 ENCOUNTER — Encounter: Payer: Self-pay | Admitting: Cardiovascular Disease

## 2017-09-14 VITALS — BP 172/80 | HR 64 | Ht 63.0 in | Wt 169.6 lb

## 2017-09-14 DIAGNOSIS — I10 Essential (primary) hypertension: Secondary | ICD-10-CM

## 2017-09-14 DIAGNOSIS — E785 Hyperlipidemia, unspecified: Secondary | ICD-10-CM

## 2017-09-14 DIAGNOSIS — I480 Paroxysmal atrial fibrillation: Secondary | ICD-10-CM

## 2017-09-14 DIAGNOSIS — Z7901 Long term (current) use of anticoagulants: Secondary | ICD-10-CM

## 2017-09-14 DIAGNOSIS — Z950 Presence of cardiac pacemaker: Secondary | ICD-10-CM

## 2017-09-14 DIAGNOSIS — M25473 Effusion, unspecified ankle: Secondary | ICD-10-CM | POA: Diagnosis not present

## 2017-09-14 DIAGNOSIS — E039 Hypothyroidism, unspecified: Secondary | ICD-10-CM | POA: Diagnosis not present

## 2017-09-14 DIAGNOSIS — I495 Sick sinus syndrome: Secondary | ICD-10-CM | POA: Diagnosis not present

## 2017-09-14 MED ORDER — HYDROCHLOROTHIAZIDE 12.5 MG PO CAPS: 12.5000 mg | ORAL_CAPSULE | Freq: Every day | ORAL | 3 refills | Status: DC

## 2017-09-14 NOTE — Patient Instructions (Signed)
Medication Instructions:  HCTZ 12.5 mg daily  Follow-Up: 6 months with [name]  Any Other Special Instructions Will Be Listed Below (If Applicable).     If you need a refill on your cardiac medications before your next appointment, please call your pharmacy.

## 2017-09-14 NOTE — Progress Notes (Signed)
Patient ID: Denise Pugh, female   DOB: [birth year], 84 y.o.   MRN: [id number]     HPI: Denise Pugh is a 84 y.o. female who presents to the office today for a 3 month followup cardiology evaluation.  Ms. Cayley Pester has a history of SVTand moderate left ventricular hypertrophy with documented "spade-like ventricle."  She has previously documented diffuse T-wave abnormalities. A stress test in May 2013 showed breast attenuation artifact with a post stress ejection fraction of 70% without evidence for scar or ischemia. She had experienced recurrent episodes of palpitations. A cardiac monitor revealed episodes of SVT up to 185 beats per minute and her beta blocker dose was increased.  A CardioNet monitor on her increased dose of Toprol-XL 25 mg still revealed bursts of atrial tachycardia/A flutter rate at 189 beats per minute.  On  May 17 she had an episode of tachycardia palpitations and did also have episodes of bradycardia; some of her spells suggested SVT at 150  in addition to episode of accelerated idioventricular rhythm when she was sinus bradycardic.  A 2-D echo Doppler study on 09/15/2012 showed an ejection fraction of 08-14%,GYJEH 1 diastolic dysfunction and elevated LV filling pressures.  The aortic valve was as sclerotic without stenosis and there was mild/moderate central regurgitation. 10 mild mitral regurgitation and mild tricuspid regurgitation with mild pulmonary hypertension with a PA estimate pressure at 47 mm.  In April 2015, she was admitted to [facility] hospital with atrial fibrillation with a rapid ventricular response.  She also had a CardioNet monitor which had shown episodes of sinus bradycardia, as well as PAF, with rates up to 170 beats per minute with also questionable nonsustained VT versus actual fibrillation with aberrancy.  She was started on amiodarone. Her Eliquis was held, and she underwent permanent pacemaker insertion.  She was seen in August 2015 by Dr. Sallyanne Kuster for  pacemaker follow-up.  Her underlying rhythm was sinus bradycardia at 38 bpm, and she was pacing the atrium greater than 99% of the time without hardly any episodes of ventricular pacing.  She recently saw Dr. Sallyanne Kuster for one-year evaluation in October 2016.  She has both sinus node dysfunction and AV conduction abnormalities.  Her pacemaker is programmed with MVP on and she has a very long AV conduction time so as to prevent ventricular pacing.  Since I last saw her, she underwent a pacemaker evaluation with Dr. Sallyanne Kuster was in October 2017.   She has been undergoing 3 month interval device checks.  She was atrially paced and ventricular sensing with a long AV delay.  Estimated longevity is 6.5 years.  There is not been any detected atrial fibrillation or ventricular tachycardia.   Se has had difficulty with low back discomfort making it difficult to walk.  She has been undergoing physical therapy.  She also received injections and also had acupuncture.  She admits to purposeful weight loss over the last year and has lost approximately 20 pounds.  She denies any episodes of chest pain.  There have been no episodes of recurrent atrial fibrillation.  She continues to be on amiodarone 200 mg daily and takes eliquis 5 mg twice a day for anticoagulation.  She continues to be on losartan HCT and metoprolol for hypertension.    Since I last saw her in October 2018, she was seen by Dr. Sallyanne Kuster early March 2019.  Pacemaker interrogation showed uninterrupted atrial fibrillation since late December 2018.  Rate control was good averaging in the 80s.  She  had noticed more shortness of breath and was symptomatic with AF and had been compliant with anticoagulation she underwent cardioversion on June 07, 2017 successfully.  Postprocedure ECG showed atrially paced and ventricular sensed rhythm.  She has not noticed any major change in her symptoms following the cardioversion.  She denies chest pain.  She denies  palpitations.  She has been maintained on amiodarone 400 mg daily in addition to metoprolol 50 mg twice a day.  She is on Eliquis 5 mg twice a day.  She continues with atorvastatin for hyperlipidemia.  She has had some issues with osteoarthritis.    Last saw her in June 23, 2017 at which time she was atrially paced prolonged AV conduction.  I recommended that she reduce her amiodarone to 300 mg daily.  She is now subsequently been reduced to 200 mg.  She admits to having fatigue.  Is unaware of any recurrent arrhythmia.  She admits to occasional ankle swelling.  There is no chest tightness remote device check on Aug 10, 2017 did not reveal any significant episodes of high ventricular rate or atrial mode switch.  There is no episodes of atrial fibrillation.  She presents for follow-up evaluation.   Past Medical History:  Diagnosis Date  . Arrhythmia    HX of SVT. CARDIONET MONITOR 07/15/12 TO 08/13/12  . Arthritis   . GERD (gastroesophageal reflux disease)   . Hyperlipidemia 08/06/11   lexiscan myoview-normal. Due to severe attenuation the study specificity and sensitivty are deminished.  . Hypertension 08/25/10   ECHO-EF 60-65%  . Hypothyroidism   . Irregular heart beat   . Shortness of breath   . Sleep apnea    SLEEP STUDY-[facility]  . Thyroid disease     Past Surgical History:  Procedure Laterality Date  . CARDIAC CATHETERIZATION  07/06/01   spade-like configuration  . CARDIOVERSION N/A 06/07/2017   Procedure: CARDIOVERSION;  Surgeon: [name], MD;  Location: [facility];  Service: Cardiovascular;  Laterality: N/A;  . DILATION AND CURETTAGE OF UTERUS    . EYE SURGERY    . KNEE ARTHROSCOPY    . PERMANENT PACEMAKER INSERTION N/A 07/20/2013   Procedure: PERMANENT PACEMAKER INSERTION;  Surgeon: [name], MD;  Location: [facility];  Service: Cardiovascular;  Laterality: N/A;    Allergies  Allergen Reactions  . Penicillins Other (See Comments)    Has  patient had a PCN reaction causing immediate rash, facial/tongue/throat swelling, SOB or lightheadedness with hypotension: Unknown Has patient had a PCN reaction causing severe rash involving mucus membranes or skin necrosis: Unknown Has patient had a PCN reaction that required hospitalization: Unknown Has patient had a PCN reaction occurring within the last 10 years: Unknown If all of the above answers are "NO", then may proceed with Cephalosporin use.   . Tetanus Toxoid Swelling    Current Outpatient Medications  Medication Sig Dispense Refill  . acetaminophen (TYLENOL) 500 MG tablet Take 500 mg by mouth every 6 (six) hours as needed for moderate pain, fever or headache.     Marland Kitchen amiodarone (PACERONE) 200 MG tablet TAKE ONE TABLET BY MOUTH ONCE DAILY 90 tablet 1  . atorvastatin (LIPITOR) 20 MG tablet Take 0.5 tablets (10 mg total) by mouth daily. 45 tablet 3  . Cholecalciferol (VITAMIN D) 2000 UNITS tablet Take 2,000 Units by mouth daily.    Marland Kitchen dexlansoprazole (DEXILANT) 60 MG capsule Take 60 mg by mouth daily.    Marland Kitchen docusate sodium (COLACE) 100 MG capsule  Take 100 mg by mouth 2 (two) times daily.    Marland Kitchen ELIQUIS 5 MG TABS tablet TAKE 1 TABLET BY MOUTH TWO  TIMES DAILY (Patient taking differently: TAKE 5 MG BY MOUTH TWO TIMES DAILY) 180 tablet 1  . escitalopram (LEXAPRO) 5 MG tablet Take 5 mg by mouth daily.    . hydrochlorothiazide (MICROZIDE) 12.5 MG capsule Take 1 capsule (12.5 mg total) by mouth daily. 90 capsule 3  . levothyroxine (SYNTHROID, LEVOTHROID) 75 MCG tablet Take 75 mcg by mouth daily before breakfast.     . Liniments (DEEP BLUE RELIEF EX) Apply 1 application topically daily as needed (for back, shoulder and knee pain).    . metoprolol tartrate (LOPRESSOR) 50 MG tablet TAKE ONE TABLET BY MOUTH TWICE DAILY 180 tablet 1  . Multiple Vitamins-Minerals (PRESERVISION AREDS 2 PO) Take 2 tablets by mouth daily.    . mupirocin ointment (BACTROBAN) 2 % Place 1 application into the nose daily  as needed (for dry nose).    . Naphazoline-Pheniramine (OPCON-A OP) Place 1 drop into both eyes daily as needed (for dry eyes).    Marland Kitchen OVER THE COUNTER MEDICATION Place 1 drop into both ears daily. Organic Ear Oil    . oxybutynin (DITROPAN) 5 MG tablet Take 5 mg by mouth 2 (two) times daily.     . sodium chloride (OCEAN) 0.65 % SOLN nasal spray Place 1 spray into both nostrils as needed for congestion.    . traMADol (ULTRAM) 50 MG tablet Take 100 mg by mouth 2 (two) times daily.      No current facility-administered medications for this visit.    Social history is notable in that she is now. Her husband did suffer a CVA and has been having more difficulty at home. She has one child, 2 step sons, one grandchild and 2 great-grandchildren. There is no tobacco or alcohol use. She does admit to frequent anxiety.  ROS General: Negative; No fevers, chills, or night sweats; positive for fatigue HEENT: Negative; No changes in vision or hearing, sinus congestion, difficulty swallowing Pulmonary: Negative; No cough, wheezing, shortness of breath, hemoptysis Cardiovascular: see HPI GI: Negative; No nausea, vomiting, diarrhea, or abdominal pain GU: Negative; No dysuria, hematuria, or difficulty voiding Musculoskeletal: Positive for low back pain and osteoarthritis Hematologic/Oncology: Negative; no easy bruising, bleeding Endocrine: Negative; no heat/cold intolerance; no diabetes Neuro: Negative; no changes in balance, headaches Skin: Negative; No rashes or skin lesions Psychiatric: Negative; No behavioral problems, depression Sleep: Negative; No snoring, daytime sleepiness, hypersomnolence, bruxism, restless legs, hypnogognic hallucinations, no cataplexy Other comprehensive 14 point system review is negative.   PE BP (!) 172/80   Pulse 64   Ht '5\' 3"'  (1.6 m)   Wt 169 lb 9.6 oz (76.9 kg)   BMI 30.04 kg/m    Repeat blood pressure by me 160/80  Wt Readings from Last 3 Encounters:  09/14/17 169  lb 9.6 oz (76.9 kg)  06/23/17 164 lb (74.4 kg)  06/07/17 162 lb (73.5 kg)  Member 2016: Weight 181 pounds    General: Alert, oriented, no distress.  Skin: normal turgor, no rashes, warm and dry HEENT: Normocephalic, atraumatic. Pupils equal round and reactive to light; sclera anicteric; extraocular muscles intact;  Nose without nasal septal hypertrophy Mouth/Parynx benign; Mallinpatti scale 3 Neck: No JVD, no carotid bruits; normal carotid upstroke Lungs: clear to ausculatation and percussion; no wheezing or rales Chest wall: without tenderness to palpitation Heart: PMI not displaced, RRR, s1 s2 normal, 1/6 systolic murmur, no  diastolic murmur, no rubs, gallops, thrills, or heaves Abdomen: soft, nontender; no hepatosplenomehaly, BS+; abdominal aorta nontender and not dilated by palpation. Back: no CVA tenderness Pulses 2+ Musculoskeletal: full range of motion, normal strength, no joint deformities Extremities: trace ankle edema; no clubbing cyanosis or edema, Homan's sign negative  Neurologic: grossly nonfocal; Cranial nerves grossly wnl Psychologic: Normal mood and affect   ECG (independently read by me): Atrially paced rhythm at 64 bpm.  Prolonged AV conduction with PR interval of '2 6 6 ' ms.  Inferolateral ST wave abnormality.  TC interval 443 ms.  June 23, 2017 ECG (independently read by me): Atrially paced with prolonged AV conduction with a PR interval at 276.  Ventricular rate 63.  Anterolateral T wave changes  October 2018 ECG (independently read by me): Atrially paced rhythm at 69 bpm.  Prolonged AV conduction at 298 ms. ST-T abnormality anterolaterally.  January 2018 ECG (independently read by me): Atrial paced rhythm at 68 bpm.  Prolonged AV conduction at 300 ms. ,  Incomplete right bundle-branch block Anterolateral ST-T changes  April 2017 ECG (independently read by me): Sinus rhythm with markedly prolonged AV interval at ~ 440 msec.  Right bundle branch block  November  2016 ECG (independently read by me): 100% atrial pacing with a markedly prolonged PR interval at 480 ms.  Ventricular sensing.  Incomplete right bundle branch block.  ECG (independently read by me): Atrial paced rhythm at 86 bpm with prolonged PR segment.  T-wave inversion  December 2015 ECG (independently read by me): Atrially paced rhythm with ventricular sensing with incomplete right bundle branch block.  Previously noted T-wave abnormalities  07/24/2013 ECG: Atrial lead paced rhythm.  Mild RV conduction delay.  Diffuse T-wave inversion has been present previously  Prior ECG today  (independently read by me): Bradycardia at 48 beats per minute. RV conduction delay. Previously noted diffuse inferior and anterolateral T wave inversion  Prior ECG of 01/20/2013: sinus bradycardia at 55 beats per minute. Previously noted diffuse T-wave inversion in leads 51F, V2 through V6. Mild RV conduction delay.  LABS: BMP Latest Ref Rng & Units 06/01/2017 03/21/2017 05/08/2014  Glucose 65 - 99 mg/dL 82 90 84  BUN 8 - 27 mg/dL '20 15 23  ' Creatinine 0.57 - 1.00 mg/dL 1.36(H) 0.94 0.98  BUN/Creat Ratio 12 - 28 15 - -  Sodium 134 - 144 mmol/L 137 134(L) 135  Potassium 3.5 - 5.2 mmol/L 5.2 3.4(L) 4.9  Chloride 96 - 106 mmol/L 97 96(L) 95(L)  CO2 20 - 29 mmol/L 23 24 33(H)  Calcium 8.7 - 10.3 mg/dL 9.0 9.1 8.9   Hepatic Function Latest Ref Rng & Units 03/21/2017 07/18/2013 08/10/2012  Total Protein 6.5 - 8.1 g/dL 7.8 7.0 6.8  Albumin 3.5 - 5.0 g/dL 4.0 3.6 4.1  AST 15 - 41 U/L 34 26 17  ALT 14 - 54 U/L '29 31 16  ' Alk Phosphatase 38 - 126 U/L 95 119(H) 85  Total Bilirubin 0.3 - 1.2 mg/dL 0.9 0.3 0.4   CBC Latest Ref Rng & Units 06/01/2017 03/21/2017 05/08/2014  WBC 3.4 - 10.8 x10E3/uL 6.6 8.7 6.2  Hemoglobin 11.1 - 15.9 g/dL 14.6 14.6 14.4  Hematocrit 34.0 - 46.6 % 46.1 43.8 43.5  Platelets 150 - 379 x10E3/uL 187 195 210   Lab Results  Component Value Date   MCV 91 06/01/2017   MCV 91.4 03/21/2017   MCV  92.0 05/08/2014   Lab Results  Component Value Date   TSH 2.630 06/01/2017  Lipid Panel     Component Value Date/Time   CHOL 120 07/19/2013 0539   TRIG 52 07/19/2013 0539   HDL 45 07/19/2013 0539   CHOLHDL 2.7 07/19/2013 0539   VLDL 10 07/19/2013 0539   LDLCALC 65 07/19/2013 0539   RADIOLOGY: No results found.  IMPRESSION:  1. Paroxysmal atrial fibrillation (HCC)   2. Long term (current) use of anticoagulants   3. Hypertension, essential   4. Hypothyroidism, unspecified type   5. SSS (sick sinus syndrome) (HCC)   6. Pacemaker   7. Hyperlipidemia LDL goal <70   8. Ankle edema     ASSESSMENT AND PLAN:   Ms. Karl is an 84 year old Caucasian female who has a history of SVT and documented moderate left ventricle hypertrophy the with a "spade-like ventricle."  She has diffuse T-wave abnormalities which have been present for years. In 2003 cardiac catheterization did not demonstrate coronary obstructive disease but she had mild midsystolic bridging not felt to be significant in her LAD territory.  She has a history of SVT, PAF, and had developed bradycardia arrhythmia resulting in her permanent pacemaker implantation in 2015.  She recently was demonstrated to have persistent atrial fibrillation.  She has a cha2ds2vasc score of 4.  She underwent successful DC cardioversion on June 07, 2017.  She has been maintaining sinus rhythm and has been without recurrent atrial fibrillation since her cardioversion.  Her amiodarone dose has been reduced and currently she is maintaining sinus rhythm on 200 mg daily.  She has recent issues of trace ankle edema.  I am giving her prescription for HCTZ 12.5 mg which should also be helpful since her blood pressure is elevated today.  We will continue taking metoprolol 50 mg twice a day.  She is on atorvastatin for hyperlipidemia and is tolerating this well.  There is no bleeding on Eliquis.  She is on levothyroxine for hypothyroidism.  Her [name] is her  primary physician who has been checking laboratory.  If not checked recently fasting laboratory should be undertaken.  I will see her in 6 months for reevaluation.  Time spent: 25 minutes  Troy Sine M.D., [facility] 09/16/2017 2:29 PM

## 2017-09-16 ENCOUNTER — Encounter: Payer: Self-pay | Admitting: Cardiovascular Disease

## 2017-10-05 ENCOUNTER — Other Ambulatory Visit: Payer: Self-pay | Admitting: Cardiovascular Disease

## 2017-10-05 NOTE — Telephone Encounter (Signed)
Rx request sent to pharmacy.  

## 2017-10-25 ENCOUNTER — Other Ambulatory Visit: Payer: Self-pay | Admitting: Cardiovascular Disease

## 2017-11-02 ENCOUNTER — Telehealth: Payer: Self-pay | Admitting: Cardiology

## 2017-11-02 ENCOUNTER — Ambulatory Visit (INDEPENDENT_AMBULATORY_CARE_PROVIDER_SITE_OTHER): Payer: Medicare Other | Admitting: *Deleted

## 2017-11-02 DIAGNOSIS — I495 Sick sinus syndrome: Secondary | ICD-10-CM

## 2017-11-02 DIAGNOSIS — I10 Essential (primary) hypertension: Secondary | ICD-10-CM

## 2017-11-02 NOTE — Progress Notes (Signed)
Remote pacemaker transmission.   

## 2017-11-02 NOTE — Telephone Encounter (Signed)
Spoke with pt and reminded pt of remote transmission that is due today. Pt verbalized understanding.   

## 2017-11-17 DIAGNOSIS — H353122 Nonexudative age-related macular degeneration, left eye, intermediate dry stage: Secondary | ICD-10-CM | POA: Diagnosis not present

## 2017-11-26 LAB — CUP PACEART REMOTE DEVICE CHECK
Battery Remaining Longevity: 63 mo
Battery Voltage: 3 V
Brady Statistic AP VS Percent: 99.87 %
Brady Statistic AS VS Percent: 0.03 %
Date Time Interrogation Session: 20190813171229
Implantable Lead Implant Date: 20150430
Implantable Lead Location: 753859
Implantable Lead Model: 5076
Implantable Pulse Generator Implant Date: 20150430
Lead Channel Impedance Value: 437 Ohm
Lead Channel Impedance Value: 570 Ohm
Lead Channel Pacing Threshold Amplitude: 0.75 V
Lead Channel Pacing Threshold Amplitude: 1.25 V
Lead Channel Sensing Intrinsic Amplitude: 1.5 mV
Lead Channel Sensing Intrinsic Amplitude: 1.5 mV
Lead Channel Sensing Intrinsic Amplitude: 31.625 mV
Lead Channel Setting Pacing Amplitude: 2.5 V
Lead Channel Setting Sensing Sensitivity: 4 mV
MDC IDC LEAD IMPLANT DT: 20150430
MDC IDC LEAD LOCATION: 753860
MDC IDC MSMT LEADCHNL RA IMPEDANCE VALUE: 380 Ohm
MDC IDC MSMT LEADCHNL RA PACING THRESHOLD PULSEWIDTH: 0.4 ms
MDC IDC MSMT LEADCHNL RV IMPEDANCE VALUE: 532 Ohm
MDC IDC MSMT LEADCHNL RV PACING THRESHOLD PULSEWIDTH: 0.4 ms
MDC IDC MSMT LEADCHNL RV SENSING INTR AMPL: 31.625 mV
MDC IDC SET LEADCHNL RA PACING AMPLITUDE: 2.5 V
MDC IDC SET LEADCHNL RV PACING PULSEWIDTH: 0.4 ms
MDC IDC STAT BRADY AP VP PERCENT: 0.1 %
MDC IDC STAT BRADY AS VP PERCENT: 0 %
MDC IDC STAT BRADY RA PERCENT PACED: 99.96 %
MDC IDC STAT BRADY RV PERCENT PACED: 0.1 %

## 2017-11-29 ENCOUNTER — Other Ambulatory Visit: Payer: Self-pay | Admitting: Cardiovascular Disease

## 2017-12-24 DIAGNOSIS — L97522 Non-pressure chronic ulcer of other part of left foot with fat layer exposed: Secondary | ICD-10-CM | POA: Diagnosis not present

## 2017-12-24 DIAGNOSIS — M79672 Pain in left foot: Secondary | ICD-10-CM | POA: Diagnosis not present

## 2018-01-06 DIAGNOSIS — I482 Chronic atrial fibrillation, unspecified: Secondary | ICD-10-CM | POA: Diagnosis not present

## 2018-01-06 DIAGNOSIS — E039 Hypothyroidism, unspecified: Secondary | ICD-10-CM | POA: Diagnosis not present

## 2018-01-06 DIAGNOSIS — M1991 Primary osteoarthritis, unspecified site: Secondary | ICD-10-CM | POA: Diagnosis not present

## 2018-01-06 DIAGNOSIS — R5383 Other fatigue: Secondary | ICD-10-CM | POA: Diagnosis not present

## 2018-01-11 DIAGNOSIS — E559 Vitamin D deficiency, unspecified: Secondary | ICD-10-CM | POA: Diagnosis not present

## 2018-01-11 DIAGNOSIS — Z0000 Encounter for general adult medical examination without abnormal findings: Secondary | ICD-10-CM | POA: Diagnosis not present

## 2018-01-11 DIAGNOSIS — E782 Mixed hyperlipidemia: Secondary | ICD-10-CM | POA: Diagnosis not present

## 2018-01-11 DIAGNOSIS — Z23 Encounter for immunization: Secondary | ICD-10-CM | POA: Diagnosis not present

## 2018-01-19 ENCOUNTER — Other Ambulatory Visit: Payer: Self-pay | Admitting: Cardiovascular Disease

## 2018-01-26 DIAGNOSIS — M19072 Primary osteoarthritis, left ankle and foot: Secondary | ICD-10-CM | POA: Diagnosis not present

## 2018-01-26 DIAGNOSIS — L600 Ingrowing nail: Secondary | ICD-10-CM | POA: Diagnosis not present

## 2018-02-01 ENCOUNTER — Ambulatory Visit (INDEPENDENT_AMBULATORY_CARE_PROVIDER_SITE_OTHER): Payer: Medicare Other | Admitting: *Deleted

## 2018-02-01 ENCOUNTER — Telehealth: Payer: Self-pay | Admitting: Cardiology

## 2018-02-01 DIAGNOSIS — I495 Sick sinus syndrome: Secondary | ICD-10-CM | POA: Diagnosis not present

## 2018-02-01 NOTE — Telephone Encounter (Signed)
LMOVM reminding pt to send remote transmission.   

## 2018-02-02 NOTE — Progress Notes (Signed)
Remote pacemaker transmission.   

## 2018-03-09 ENCOUNTER — Ambulatory Visit (INDEPENDENT_AMBULATORY_CARE_PROVIDER_SITE_OTHER): Payer: Medicare Other | Admitting: Cardiovascular Disease

## 2018-03-09 ENCOUNTER — Encounter: Payer: Self-pay | Admitting: Cardiovascular Disease

## 2018-03-09 VITALS — BP 126/76 | HR 83 | Ht 63.0 in | Wt 170.4 lb

## 2018-03-09 DIAGNOSIS — E039 Hypothyroidism, unspecified: Secondary | ICD-10-CM

## 2018-03-09 DIAGNOSIS — I10 Essential (primary) hypertension: Secondary | ICD-10-CM | POA: Diagnosis not present

## 2018-03-09 DIAGNOSIS — I480 Paroxysmal atrial fibrillation: Secondary | ICD-10-CM | POA: Diagnosis not present

## 2018-03-09 DIAGNOSIS — Z7901 Long term (current) use of anticoagulants: Secondary | ICD-10-CM | POA: Diagnosis not present

## 2018-03-09 DIAGNOSIS — Z950 Presence of cardiac pacemaker: Secondary | ICD-10-CM | POA: Diagnosis not present

## 2018-03-09 DIAGNOSIS — I517 Cardiomegaly: Secondary | ICD-10-CM

## 2018-03-09 MED ORDER — AMIODARONE HCL 200 MG PO TABS: 200.0000 mg | ORAL_TABLET | Freq: Two times a day (BID) | ORAL | 3 refills | Status: DC

## 2018-03-09 NOTE — Progress Notes (Signed)
Patient ID: Denise Pugh, female   DOB: 12/13/1928, 84 y.o.   MRN: [id number]     HPI: Denise Pugh is a 84 y.o. female who presents to the office today for a 6  month followup cardiology evaluation.  [name] has a history of SVTand moderate left ventricular hypertrophy with documented "spade-like ventricle."  She has previously documented diffuse T-wave abnormalities. A stress test in May 2013 showed breast attenuation artifact with a post stress ejection fraction of 70% without evidence for scar or ischemia. She had experienced recurrent episodes of palpitations. A cardiac monitor revealed episodes of SVT up to 185 beats per minute and her beta blocker dose was increased.  A CardioNet monitor on her increased dose of Toprol-XL 25 mg still revealed bursts of atrial tachycardia/A flutter rate at 189 beats per minute.  On  May 17 she had an episode of tachycardia palpitations and did also have episodes of bradycardia; some of her spells suggested SVT at 150  in addition to episode of accelerated idioventricular rhythm when she was sinus bradycardic.  A 2-D echo Doppler study on 09/15/2012 showed an ejection fraction of 19-37%,TKWIO 1 diastolic dysfunction and elevated LV filling pressures.  The aortic valve was as sclerotic without stenosis and there was mild/moderate central regurgitation. 10 mild mitral regurgitation and mild tricuspid regurgitation with mild pulmonary hypertension with a PA estimate pressure at 47 mm.  In April 2015, she was admitted to [facility] hospital with atrial fibrillation with a rapid ventricular response.  She also had a CardioNet monitor which had shown episodes of sinus bradycardia, as well as PAF, with rates up to 170 beats per minute with also questionable nonsustained VT versus actual fibrillation with aberrancy.  She was started on amiodarone. Her Eliquis was held, and she underwent permanent pacemaker insertion.  She was seen in August 2015 by Dr. Sallyanne Kuster for  pacemaker follow-up.  Her underlying rhythm was sinus bradycardia at 38 bpm, and she was pacing the atrium greater than 99% of the time without hardly any episodes of ventricular pacing.  She recently saw Dr. Sallyanne Kuster for one-year evaluation in October 2016.  She has both sinus node dysfunction and AV conduction abnormalities.  Her pacemaker is programmed with MVP on and she has a very long AV conduction time so as to prevent ventricular pacing.  She underwent a pacemaker evaluation with Dr. Sallyanne Kuster was in October 2017.   She has been undergoing 3 month interval device checks.  She was atrially paced and ventricular sensing with a long AV delay.  Estimated longevity is 6.5 years.  There is not been any detected atrial fibrillation or ventricular tachycardia.   She has had difficulty with low back discomfort making it difficult to walk.  She has been undergoing physical therapy.  She also received injections and also had acupuncture.  She admits to purposeful weight loss over the last year and has lost approximately 20 pounds.  She denies any episodes of chest pain.  There have been no episodes of recurrent atrial fibrillation.  She continues to be on amiodarone 200 mg daily and takes eliquis 5 mg twice a day for anticoagulation.  She continues to be on losartan HCT and metoprolol for hypertension.    She was seen by Dr. Sallyanne Kuster early March 2019.  Pacemaker interrogation showed uninterrupted atrial fibrillation since late December 2018.  Rate control was good averaging in the 80s.  She had noticed more shortness of breath and was symptomatic with AF and  had been compliant with anticoagulation she underwent cardioversion on June 07, 2017 successfully.  Postprocedure ECG showed atrially paced and ventricular sensed rhythm.  She has not noticed any major change in her symptoms following the cardioversion.  She denies chest pain.  She denies palpitations.  She has been maintained on amiodarone 400 mg daily in  addition to metoprolol 50 mg twice a day.  She is on Eliquis 5 mg twice a day.  She continues with atorvastatin for hyperlipidemia.  She has had some issues with osteoarthritis.    I saw her in June 23, 2017 at which time she was atrially paced prolonged AV conduction.  I recommended that she reduce her amiodarone to 300 mg daily.  Subsequently, amiodarone dose was reduced to 200 mg daily  I last saw her in June 2019.  She was maintaining sinus rhythm.  She was having some mild lower extremity edema and her blood pressure was elevated; HCTZ 12.5 mg added to her regimen.,  She has noticed recurrent episodes of palpitations.  She denies chest pain PND orthopnea.  She presents for evaluation.  Past Medical History:  Diagnosis Date  . Arrhythmia    HX of SVT. CARDIONET MONITOR 07/15/12 TO 08/13/12  . Arthritis   . GERD (gastroesophageal reflux disease)   . Hyperlipidemia 08/06/11   lexiscan myoview-normal. Due to severe attenuation the study specificity and sensitivty are deminished.  . Hypertension 08/25/10   ECHO-EF 60-65%  . Hypothyroidism   . Irregular heart beat   . Shortness of breath   . Sleep apnea    SLEEP STUDY-[facility]  . Thyroid disease     Past Surgical History:  Procedure Laterality Date  . CARDIAC CATHETERIZATION  07/06/01   spade-like configuration  . CARDIOVERSION N/A 06/07/2017   Procedure: CARDIOVERSION;  Surgeon: [name], MD;  Location: [facility] ENDOSCOPY;  Service: Cardiovascular;  Laterality: N/A;  . DILATION AND CURETTAGE OF UTERUS    . EYE SURGERY    . KNEE ARTHROSCOPY    . PERMANENT PACEMAKER INSERTION N/A 07/20/2013   Procedure: PERMANENT PACEMAKER INSERTION;  Surgeon: [name], MD;  Location: [place] [facility] CATH LAB;  Service: Cardiovascular;  Laterality: N/A;    Allergies  Allergen Reactions  . Penicillins Other (See Comments)    Has patient had a PCN reaction causing immediate rash, facial/tongue/throat swelling, SOB or lightheadedness  with hypotension: Unknown Has patient had a PCN reaction causing severe rash involving mucus membranes or skin necrosis: Unknown Has patient had a PCN reaction that required hospitalization: Unknown Has patient had a PCN reaction occurring within the last 10 years: Unknown If all of the above answers are "NO", then may proceed with Cephalosporin use.   . Tetanus Toxoid Swelling    Current Outpatient Medications  Medication Sig Dispense Refill  . acetaminophen (TYLENOL) 500 MG tablet Take 500 mg by mouth every 6 (six) hours as needed for moderate pain, fever or headache.     Marland Kitchen amiodarone (PACERONE) 200 MG tablet TAKE 1 TABLET BY MOUTH  DAILY 90 tablet 3  . amiodarone (PACERONE) 200 MG tablet Take 1 tablet (200 mg total) by mouth 2 (two) times daily. 180 tablet 3  . atorvastatin (LIPITOR) 20 MG tablet Take 0.5 tablets (10 mg total) by mouth daily. 45 tablet 3  . Cholecalciferol (VITAMIN D) 2000 UNITS tablet Take 2,000 Units by mouth daily.    Marland Kitchen dexlansoprazole (DEXILANT) 60 MG capsule Take 60 mg by mouth daily.    Marland Kitchen docusate sodium (  COLACE) 100 MG capsule Take 100 mg by mouth 2 (two) times daily.    Marland Kitchen ELIQUIS 5 MG TABS tablet TAKE 1 TABLET BY MOUTH TWO  TIMES DAILY 180 tablet 1  . escitalopram (LEXAPRO) 5 MG tablet Take 5 mg by mouth daily.    . hydrochlorothiazide (MICROZIDE) 12.5 MG capsule Take 1 capsule (12.5 mg total) by mouth daily. 90 capsule 3  . levothyroxine (SYNTHROID, LEVOTHROID) 88 MCG tablet Take 88 mcg by mouth daily before breakfast.     . Liniments (DEEP BLUE RELIEF EX) Apply 1 application topically daily as needed (for back, shoulder and knee pain).    . metoprolol tartrate (LOPRESSOR) 50 MG tablet TAKE ONE TABLET BY MOUTH TWICE DAILY 180 tablet 1  . metoprolol tartrate (LOPRESSOR) 50 MG tablet TAKE 1 TABLET BY MOUTH TWO  TIMES DAILY 180 tablet 3  . Multiple Vitamins-Minerals (PRESERVISION AREDS 2 PO) Take 2 tablets by mouth daily.    . mupirocin ointment (BACTROBAN) 2 %  Place 1 application into the nose daily as needed (for dry nose).    Marland Kitchen OVER THE COUNTER MEDICATION Place 1 drop into both ears daily. Organic Ear Oil    . oxybutynin (DITROPAN) 5 MG tablet Take 5 mg by mouth 2 (two) times daily.     . sodium chloride (OCEAN) 0.65 % SOLN nasal spray Place 1 spray into both nostrils as needed for congestion.    . traMADol (ULTRAM) 50 MG tablet Take 100 mg by mouth 2 (two) times daily.      No current facility-administered medications for this visit.    Social history is notable in that she is now. Her husband did suffer a CVA and has been having more difficulty at home. She has one child, 2 step sons, one grandchild and 2 great-grandchildren. There is no tobacco or alcohol use. She does admit to frequent anxiety.  ROS General: Negative; No fevers, chills, or night sweats; positive for fatigue HEENT: Negative; No changes in vision or hearing, sinus congestion, difficulty swallowing Pulmonary: Negative; No cough, wheezing, shortness of breath, hemoptysis Cardiovascular: see HPI GI: Negative; No nausea, vomiting, diarrhea, or abdominal pain GU: Negative; No dysuria, hematuria, or difficulty voiding Musculoskeletal: Positive for low back pain and osteoarthritis Hematologic/Oncology: Negative; no easy bruising, bleeding Endocrine: Negative; no heat/cold intolerance; no diabetes Neuro: Negative; no changes in balance, headaches Skin: Negative; No rashes or skin lesions Psychiatric: Negative; No behavioral problems, depression Sleep: Negative; No snoring, daytime sleepiness, hypersomnolence, bruxism, restless legs, hypnogognic hallucinations, no cataplexy Other comprehensive 14 point system review is negative.   PE BP 126/76   Pulse 83   Ht '5\' 3"'  (1.6 m)   Wt 170 lb 6.4 oz (77.3 kg)   BMI 30.19 kg/m    Blood pressure by me 130/74  Wt Readings from Last 3 Encounters:  03/09/18 170 lb 6.4 oz (77.3 kg)  09/14/17 169 lb 9.6 oz (76.9 kg)  06/23/17 164 lb  (74.4 kg)   2016: Weight 181 pounds   General: Alert, oriented, no distress.  Skin: normal turgor, no rashes, warm and dry HEENT: Normocephalic, atraumatic. Pupils equal round and reactive to light; sclera anicteric; extraocular muscles intact;  Nose without nasal septal hypertrophy Mouth/Parynx benign; Mallinpatti scale 3 Neck: No JVD, no carotid bruits; normal carotid upstroke Lungs: clear to ausculatation and percussion; no wheezing or rales Chest wall: without tenderness to palpitation Heart: PMI not displaced, really irregular with rate in the 80s suggestive of atrial fibrillation, s1 s2 normal, 1/6  systolic murmur, no diastolic murmur, no rubs, gallops, thrills, or heaves Abdomen: soft, nontender; no hepatosplenomehaly, BS+; abdominal aorta nontender and not dilated by palpation. Back: no CVA tenderness Pulses 2+ Musculoskeletal: full range of motion, normal strength, no joint deformities Extremities: no clubbing cyanosis or edema, Homan's sign negative  Neurologic: grossly nonfocal; Cranial nerves grossly wnl Psychologic: Normal mood and affect   ECG (independently read by me): Atrial fibrillation at 83 bpm with  nonspecific ST-T changes . The last 3 beats of the ECG show ventricular pacing and 1 beat with AV pacing   September 14, 2017 ECG (independently read by me): Atrially paced rhythm at 64 bpm.  Prolonged AV conduction with PR interval of '2 6 6 ' ms.  Inferolateral ST wave abnormality.  TC interval 443 ms.  June 23, 2017 ECG (independently read by me): Atrially paced with prolonged AV conduction with a PR interval at 276.  Ventricular rate 63.  Anterolateral T wave changes  October 2018 ECG (independently read by me): Atrially paced rhythm at 69 bpm.  Prolonged AV conduction at 298 ms. ST-T abnormality anterolaterally.  January 2018 ECG (independently read by me): Atrial paced rhythm at 68 bpm.  Prolonged AV conduction at 300 ms. ,  Incomplete right bundle-branch block  Anterolateral ST-T changes  April 2017 ECG (independently read by me): Sinus rhythm with markedly prolonged AV interval at ~ 440 msec.  Right bundle branch block  November 2016 ECG (independently read by me): 100% atrial pacing with a markedly prolonged PR interval at 480 ms.  Ventricular sensing.  Incomplete right bundle branch block.  ECG (independently read by me): Atrial paced rhythm at 86 bpm with prolonged PR segment.  T-wave inversion  December 2015 ECG (independently read by me): Atrially paced rhythm with ventricular sensing with incomplete right bundle branch block.  Previously noted T-wave abnormalities  07/24/2013 ECG: Atrial lead paced rhythm.  Mild RV conduction delay.  Diffuse T-wave inversion has been present previously  Prior ECG today  (independently read by me): Bradycardia at 48 beats per minute. RV conduction delay. Previously noted diffuse inferior and anterolateral T wave inversion  Prior ECG of 01/20/2013: sinus bradycardia at 55 beats per minute. Previously noted diffuse T-wave inversion in leads 56F, V2 through V6. Mild RV conduction delay.  LABS: BMP Latest Ref Rng & Units 06/01/2017 03/21/2017 05/08/2014  Glucose 65 - 99 mg/dL 82 90 84  BUN 8 - 27 mg/dL '20 15 23  ' Creatinine 0.57 - 1.00 mg/dL 1.36(H) 0.94 0.98  BUN/Creat Ratio 12 - 28 15 - -  Sodium 134 - 144 mmol/L 137 134(L) 135  Potassium 3.5 - 5.2 mmol/L 5.2 3.4(L) 4.9  Chloride 96 - 106 mmol/L 97 96(L) 95(L)  CO2 20 - 29 mmol/L 23 24 33(H)  Calcium 8.7 - 10.3 mg/dL 9.0 9.1 8.9   Hepatic Function Latest Ref Rng & Units 03/21/2017 07/18/2013 08/10/2012  Total Protein 6.5 - 8.1 g/dL 7.8 7.0 6.8  Albumin 3.5 - 5.0 g/dL 4.0 3.6 4.1  AST 15 - 41 U/L 34 26 17  ALT 14 - 54 U/L '29 31 16  ' Alk Phosphatase 38 - 126 U/L 95 119(H) 85  Total Bilirubin 0.3 - 1.2 mg/dL 0.9 0.3 0.4   CBC Latest Ref Rng & Units 06/01/2017 03/21/2017 05/08/2014  WBC 3.4 - 10.8 x10E3/uL 6.6 8.7 6.2  Hemoglobin 11.1 - 15.9 g/dL 14.6 14.6  14.4  Hematocrit 34.0 - 46.6 % 46.1 43.8 43.5  Platelets 150 - 379 x10E3/uL 187 195 210  Lab Results  Component Value Date   MCV 91 06/01/2017   MCV 91.4 03/21/2017   MCV 92.0 05/08/2014   Lab Results  Component Value Date   TSH 2.630 06/01/2017   Lipid Panel     Component Value Date/Time   CHOL 120 07/19/2013 0539   TRIG 52 07/19/2013 0539   HDL 45 07/19/2013 0539   CHOLHDL 2.7 07/19/2013 0539   VLDL 10 07/19/2013 0539   LDLCALC 65 07/19/2013 0539   RADIOLOGY: No results found.  IMPRESSION:  1. Hypertension, essential   2. Paroxysmal atrial fibrillation (HCC)   3. Long term (current) use of anticoagulants   4. Hypothyroidism, unspecified type   5. Pacemaker   6. LVH (left ventricular hypertrophy)     ASSESSMENT AND PLAN:   Ms. Guin is an 84 year old Caucasian female who has a history of SVT and documented moderate left ventricle hypertrophy  with a "spade-like ventricle."  She has diffuse T-wave abnormalities which have been present for years. In 2003 cardiac catheterization did not demonstrate coronary obstructive disease but she had mild midsystolic bridging not felt to be significant in her LAD territory.  She has a history of SVT, PAF, and had developed bradycardia arrhythmia resulting in her permanent pacemaker implantation in 2015.  She was demonstrated to have persistent atrial fibrillation.  She has a cha2ds2vasc score of 4.  She underwent successful DC cardioversion on June 07, 2017.  She had been maintaining sinus rhythm and had been without recurrent atrial fibrillation since her cardioversion.  Her amiodarone dose has been reduced and last seen was maintaining sinus rhythm on 200 mg daily.  She had issues with trace edema which improved with initiation of low-dose HCTZ.  On exam today she apparently is back in atrial fibrillation which is confirmed on ECG.  She has been on amiodarone 200 mg, Eliquis 5 mg twice a day, metoprolol 50 mg twice a day in addition to  the HCTZ 12.5 mg.  On her last remote device check in September 2019 he did not have any clinically significant episodes of high ventricular rate or atrial mode switches.  With her recurrent AF I am recommending short-term increase in amiodarone back to 200 mg twice a day.  I have recommended that she see Dr. Sallyanne Kuster back in 4 weeks other than her planned appointment in March.  Her laboratory has been checked by Dr. [name] October 2019.  TSH was 3.36.  Creatinine 0.9.  BUN 14.  See her in 3 months for follow-up evaluation.  Plan: 25 minutes  Troy Sine M.D., [facility] 03/10/2018 11:46 PM

## 2018-03-09 NOTE — Patient Instructions (Addendum)
Medication Instructions:  Increase Amiodarone (Pacerone) to twice daily. If you need a refill on your cardiac medications before your next appointment, please call your pharmacy.    Follow-Up: At [facility], you and your health needs are our priority.  As part of our continuing mission to provide you with exceptional heart care, we have created designated Provider Care Teams.  These Care Teams include your primary Cardiologist (physician) and Advanced Practice Providers (APPs -  Physician Assistants and Nurse Practitioners) who all work together to provide you with the care you need, when you need it. You will need a follow up appointment in 4 weeks. You may see Dr.Croitoru or one of the following Advanced Practice Providers on your designated Care Team: Almyra Deforest, [place] . Fabian Sharp, PA-C

## 2018-03-10 ENCOUNTER — Encounter: Payer: Self-pay | Admitting: Cardiovascular Disease

## 2018-03-30 ENCOUNTER — Encounter: Payer: Self-pay | Admitting: Cardiovascular Disease

## 2018-03-30 ENCOUNTER — Ambulatory Visit: Payer: Medicare Other | Admitting: Cardiovascular Disease

## 2018-03-30 VITALS — BP 138/82 | HR 89 | Ht 63.0 in | Wt 170.0 lb

## 2018-03-30 DIAGNOSIS — I422 Other hypertrophic cardiomyopathy: Secondary | ICD-10-CM

## 2018-03-30 DIAGNOSIS — Z5181 Encounter for therapeutic drug level monitoring: Secondary | ICD-10-CM | POA: Diagnosis not present

## 2018-03-30 DIAGNOSIS — I495 Sick sinus syndrome: Secondary | ICD-10-CM | POA: Diagnosis not present

## 2018-03-30 DIAGNOSIS — I4819 Other persistent atrial fibrillation: Secondary | ICD-10-CM

## 2018-03-30 DIAGNOSIS — I10 Essential (primary) hypertension: Secondary | ICD-10-CM

## 2018-03-30 DIAGNOSIS — Z950 Presence of cardiac pacemaker: Secondary | ICD-10-CM | POA: Diagnosis not present

## 2018-03-30 DIAGNOSIS — I480 Paroxysmal atrial fibrillation: Secondary | ICD-10-CM | POA: Diagnosis not present

## 2018-03-30 DIAGNOSIS — Z7901 Long term (current) use of anticoagulants: Secondary | ICD-10-CM

## 2018-03-30 DIAGNOSIS — Z79899 Other long term (current) drug therapy: Secondary | ICD-10-CM

## 2018-03-30 NOTE — Progress Notes (Signed)
Cardiology Office Note    Date:  03/30/2018   ID:  Denise Pugh, DOB [birth year], MRN [id number]  PCP:  [name], MD  Cardiologist:  [name], M.D.; [name], MD   Chief Complaint  Patient presents with  . Follow-up    pt stated Amiodarone was increase by Dr Claiborne Billings to twice a day because of Afib  Pacemaker check  History of Present Illness:  Denise Pugh is a 85 y.o. female with a history of sinus node dysfunction here for pacemaker follow-up. She is followed by [name] for hypertension, hyperlipidemia, sleep apnea. She has moderate LVH with a configuration suggesting apical variant hypertrophic cardiomyopathy. She has previously had paroxysmal atrial fibrillation (2015) and takes amiodarone and Eliquis.  She underwent a cardioversion in March with symptomatic improvement over the summer.  Pacemaker check shows that she return to atrial fibrillation in early September.  Rate control has not been a problem.  [name] increased the dose of amiodarone to 400 mg daily at his last appointment in December and she has been compliant with Eliquis, not missing any doses in the last 3-4 weeks.  She remains in atrial fibrillation today and complains of fatigue.  She has not had dizziness, syncope, exertional dyspnea, angina or palpitations.  She has not had falls, injuries or bleeding.  Pacemaker interrogation shows normal device function.  She had virtually 100% atrial pacing until the onset of atrial fibrillation in September.  Since then she has had virtually uninterrupted persistent atrial fibrillation.  Ventricular pacing only occurs 13% of the time on the average.  Lead parameters are good, although atrial threshold testing is not possible since September 5.  Generator longevity is estimated at 3.5 years.  There are no significant episodes of high ventricular rate.  Pacemaker interrogation shows uninterrupted atrial fibrillation since late December.  Rate control is good, on the  average in the 80s.  She has had little in the way of atrial fibrillation other than this long, persistent event.  Over the last year she has only had 13% atrial fibrillation, almost entirely in the last couple of months.    Before she underwent her previous cardioversion there was a striking reduction in physical activity recorded by the pacemaker from roughly an hour a day to only 0.1 hours a day.  There was a slight improvement following the cardioversion but she remains quite sedentary with average activity last week of only 0.3 hours/day.    Past Medical History:  Diagnosis Date  . Arrhythmia    HX of SVT. CARDIONET MONITOR 07/15/12 TO 08/13/12  . Arthritis   . GERD (gastroesophageal reflux disease)   . Hyperlipidemia 08/06/11   lexiscan myoview-normal. Due to severe attenuation the study specificity and sensitivty are deminished.  . Hypertension 08/25/10   ECHO-EF 60-65%  . Hypothyroidism   . Irregular heart beat   . Shortness of breath   . Sleep apnea    SLEEP STUDY-[facility]  . Thyroid disease     Past Surgical History:  Procedure Laterality Date  . CARDIAC CATHETERIZATION  07/06/01   spade-like configuration  . CARDIOVERSION N/A 06/07/2017   Procedure: CARDIOVERSION;  Surgeon: [name], MD;  Location: [facility];  Service: Cardiovascular;  Laterality: N/A;  . DILATION AND CURETTAGE OF UTERUS    . EYE SURGERY    . KNEE ARTHROSCOPY    . PERMANENT PACEMAKER INSERTION N/A 07/20/2013   Procedure: PERMANENT PACEMAKER INSERTION;  Surgeon: Dani Gobble Brittnye Josephs,  MD;  Location: [facility]ATH [facility];  Service: Cardiovascular;  Laterality: N/A;    Current Medications: Outpatient Medications Prior to Visit  Medication Sig Dispense Refill  . acetaminophen (TYLENOL) 500 MG tablet Take 500 mg by mouth every 6 (six) hours as needed for moderate pain, fever or headache.     Marland Kitchen amiodarone (PACERONE) 200 MG tablet Take 1 tablet (200 mg total) by mouth 2 (two) times daily. 180  tablet 3  . atorvastatin (LIPITOR) 20 MG tablet Take 0.5 tablets (10 mg total) by mouth daily. 45 tablet 3  . Cholecalciferol (VITAMIN D) 2000 UNITS tablet Take 2,000 Units by mouth daily.    Marland Kitchen dexlansoprazole (DEXILANT) 60 MG capsule Take 60 mg by mouth daily.    Marland Kitchen docusate sodium (COLACE) 100 MG capsule Take 100 mg by mouth 2 (two) times daily.    Marland Kitchen ELIQUIS 5 MG TABS tablet TAKE 1 TABLET BY MOUTH TWO  TIMES DAILY 180 tablet 1  . escitalopram (LEXAPRO) 5 MG tablet Take 5 mg by mouth daily.    . hydrochlorothiazide (MICROZIDE) 12.5 MG capsule Take 1 capsule (12.5 mg total) by mouth daily. 90 capsule 3  . levothyroxine (SYNTHROID, LEVOTHROID) 88 MCG tablet Take 88 mcg by mouth daily before breakfast.     . Liniments (DEEP BLUE RELIEF EX) Apply 1 application topically daily as needed (for back, shoulder and knee pain).    . metoprolol tartrate (LOPRESSOR) 50 MG tablet TAKE 1 TABLET BY MOUTH TWO  TIMES DAILY 180 tablet 3  . Multiple Vitamins-Minerals (PRESERVISION AREDS 2 PO) Take 2 tablets by mouth daily.    . mupirocin ointment (BACTROBAN) 2 % Place 1 application into the nose daily as needed (for dry nose).    Marland Kitchen OVER THE COUNTER MEDICATION Place 1 drop into both ears daily. Organic Ear Oil    . oxybutynin (DITROPAN) 5 MG tablet Take 5 mg by mouth 2 (two) times daily.     . sodium chloride (OCEAN) 0.65 % SOLN nasal spray Place 1 spray into both nostrils as needed for congestion.    . traMADol (ULTRAM) 50 MG tablet Take 100 mg by mouth 2 (two) times daily.     . metoprolol tartrate (LOPRESSOR) 50 MG tablet TAKE ONE TABLET BY MOUTH TWICE DAILY 180 tablet 1  . amiodarone (PACERONE) 200 MG tablet TAKE 1 TABLET BY MOUTH  DAILY 90 tablet 3   No facility-administered medications prior to visit.      Allergies:   Penicillins and Tetanus toxoid   Social History   Socioeconomic History  . Marital status: Married    Spouse name: Not on file  . Number of children: Not on file  . Years of education:  Not on file  . Highest education level: Not on file  Occupational History  . Not on file  Social Needs  . Financial resource strain: Not on file  . Food insecurity:    Worry: Not on file    Inability: Not on file  . Transportation needs:    Medical: Not on file    Non-medical: Not on file  Tobacco Use  . Smoking status: Never Smoker  . Smokeless tobacco: Never Used  Substance and Sexual Activity  . Alcohol use: No    Alcohol/week: 0.0 standard drinks  . Drug use: No  . Sexual activity: Never  Lifestyle  . Physical activity:    Days per week: Not on file    Minutes per session: Not on file  . Stress:  Not on file  Relationships  . Social connections:    Talks on phone: Not on file    Gets together: Not on file    Attends religious service: Not on file    Active member of club or organization: Not on file    Attends meetings of clubs or organizations: Not on file    Relationship status: Not on file  Other Topics Concern  . Not on file  Social History Narrative  . Not on file     Family History:  The patient's family history includes Diabetes in her father and mother; Heart attack in her maternal grandfather and maternal grandmother; Hypertension in her father.   ROS:   Please see the history of present illness.    ROS All other systems reviewed and are negative.   PHYSICAL EXAM:   VS:  BP 138/82   Pulse 89   Ht 5\' 3"  (1.6 m)   Wt 170 lb (77.1 kg)   BMI 30.11 kg/m      General: Alert, oriented x3, no distress, healthy left subclavian pacemaker site Head: no evidence of trauma, PERRL, EOMI, no exophtalmos or lid lag, no myxedema, no xanthelasma; normal ears, nose and oropharynx Neck: normal jugular venous pulsations and no hepatojugular reflux; brisk carotid pulses without delay and no carotid bruits Chest: clear to auscultation, no signs of consolidation by percussion or palpation, normal fremitus, symmetrical and full respiratory excursions Cardiovascular:  normal position and quality of the apical impulse, irregular rhythm, normal first and second heart sounds, no murmurs, rubs or gallops Abdomen: no tenderness or distention, no masses by palpation, no abnormal pulsatility or arterial bruits, normal bowel sounds, no hepatosplenomegaly Extremities: no clubbing, cyanosis or edema; 2+ radial, ulnar and brachial pulses bilaterally; 2+ right femoral, posterior tibial and dorsalis pedis pulses; 2+ left femoral, posterior tibial and dorsalis pedis pulses; no subclavian or femoral bruits Neurological: grossly nonfocal Psych: Normal mood and affect    Wt Readings from Last 3 Encounters:  03/30/18 170 lb (77.1 kg)  03/09/18 170 lb 6.4 oz (77.3 kg)  09/14/17 169 lb 9.6 oz (76.9 kg)      Studies/Labs Reviewed:   EKG:  EKG is ordered today.   It shows atrial fibrillation with occasional ventricular paced beats but mostly native AV conduction.  RSR prime pattern in V1, ST depression and T wave inversion in the inferior leads as well as in most anterolateral leads, prolonged QTC 484 ms consistent with amiodarone.  The tracing is similar to the one performed in December at her visit with [name] Recent Labs: 06/01/2017: BUN 20; Creatinine, Ser 1.36; Hemoglobin 14.6; Platelets 187; Potassium 5.2; Sodium 137; TSH 2.630   Lipid Panel    Component Value Date/Time   CHOL 120 07/19/2013 0539   TRIG 52 07/19/2013 0539   HDL 45 07/19/2013 0539   CHOLHDL 2.7 07/19/2013 0539   VLDL 10 07/19/2013 0539   LDLCALC 65 07/19/2013 0539    Additional studies/ records that were reviewed today include:  Notes from [name]  ASSESSMENT:    1. Other persistent atrial fibrillation   2. SSS (sick sinus syndrome) ([place])   3. Pacemaker   4. Apical variant hypertrophic cardiomyopathy ([place])   5. Encounter for monitoring amiodarone therapy   6. Long term current use of anticoagulant   7. Essential hypertension      PLAN:  In order of problems listed  above:  1. AFib: Rate is well controlled and she is fully anticoagulated,  but she is symptomatic. CHADSVasc 4 (age, gender, hypertension).  She has been on increased dose of amiodarone for about a month now.  She has been compliant with anticoagulation.  I recommended another attempted cardioversion.  This procedure has been fully reviewed with the patient and written informed consent has been obtained.  However, if she has recurrence of atrial fibrillation even on the higher dose of amiodarone, few options are available and we may have to commit to permanent atrial fibrillation strategy.  If that is the case, would recommend stopping the amiodarone permanently due to his high side effect profile. 2. SSS: prior to the onset of atrial fibrillation the underlying rhythm was severe sinus bradycardia in the 30s so she had virtually 100% atrial pacing. 3. PPM: Normal device function.  Continue remote downloads every 3 months and yearly office visits. 4. Hypertrophic cardiomyopathy: Suspected of having apical variant hypertrophic cardiomyopathy.  She does not have evidence of outflow tract obstruction either on physical exam or previous echocardiography. 5. Amiodarone: Labs checked by Dr. [name] in October showed TSH of 3.36, normal liver function test, creatinine 0.9. 6. Anticoagulation: Without falls or bleeding complications. 7. HTN: She has fair even if not perfect blood pressure control.    Medication Adjustments/Labs and Tests Ordered: Current medicines are reviewed at length with the patient today.  Concerns regarding medicines are outlined above.  Medication changes, Labs and Tests ordered today are listed in the Patient Instructions below. Patient Instructions   [place] [address] Dept: [phone] Loc: [place]  03/30/2018   You are scheduled for a  TEE/Cardioversion/TEE Cardioversion on Thursday, January 23rd, 2020 with Dr. Sallyanne Kuster.  Please arrive at the [facility] (Main Entrance A) at [facility]: [address] [place], [place] [address] at 12:30 pm.   DIET: Nothing to eat or drink after midnight except a sip of water with medications (see medication instructions below)  Medication Instructions: Continue your anticoagulant: Eliquis You will need to continue your anticoagulant after your procedure until you are told by your provider that it is safe to stop   Labs: Completed TODAY  You must have a responsible person to drive you home and stay in the waiting area during your procedure. Failure to do so could result in cancellation.  Bring your insurance cards.  *Special Note: Every effort is made to have your procedure done on time. Occasionally there are emergencies that occur at the hospital that may cause delays. Please be patient if a delay does occur.      Signed, [name], MD  03/30/2018 4:18 PM    [facility] [address], [place], [place]  [address] Phone: [phone]; Fax: [phone]

## 2018-03-30 NOTE — H&P (View-Only) (Signed)
Cardiology Office Note    Date:  03/30/2018   ID:  Denise Pugh, DOB January 09, 1929, MRN [id number]  PCP:  [name], MD  Cardiologist:  [name], M.D.; [name], MD   Chief Complaint  Patient presents with  . Follow-up    pt stated Amiodarone was increase by Dr Claiborne Billings to twice a day because of Afib  Pacemaker check  History of Present Illness:  Denise Pugh is a 85 y.o. female with a history of sinus node dysfunction here for pacemaker follow-up. She is followed by [name] for hypertension, hyperlipidemia, sleep apnea. She has moderate LVH with a configuration suggesting apical variant hypertrophic cardiomyopathy. She has previously had paroxysmal atrial fibrillation (2015) and takes amiodarone and Eliquis.  She underwent a cardioversion in March with symptomatic improvement over the summer.  Pacemaker check shows that she return to atrial fibrillation in early September.  Rate control has not been a problem.  [name] increased the dose of amiodarone to 400 mg daily at his last appointment in December and she has been compliant with Eliquis, not missing any doses in the last 3-4 weeks.  She remains in atrial fibrillation today and complains of fatigue.  She has not had dizziness, syncope, exertional dyspnea, angina or palpitations.  She has not had falls, injuries or bleeding.  Pacemaker interrogation shows normal device function.  She had virtually 100% atrial pacing until the onset of atrial fibrillation in September.  Since then she has had virtually uninterrupted persistent atrial fibrillation.  Ventricular pacing only occurs 13% of the time on the average.  Lead parameters are good, although atrial threshold testing is not possible since September 5.  Generator longevity is estimated at 3.5 years.  There are no significant episodes of high ventricular rate.  Pacemaker interrogation shows uninterrupted atrial fibrillation since late December.  Rate control is good, on the  average in the 80s.  She has had little in the way of atrial fibrillation other than this long, persistent event.  Over the last year she has only had 13% atrial fibrillation, almost entirely in the last couple of months.    Before she underwent her previous cardioversion there was a striking reduction in physical activity recorded by the pacemaker from roughly an hour a day to only 0.1 hours a day.  There was a slight improvement following the cardioversion but she remains quite sedentary with average activity last week of only 0.3 hours/day.    Past Medical History:  Diagnosis Date  . Arrhythmia    HX of SVT. CARDIONET MONITOR 07/15/12 TO 08/13/12  . Arthritis   . GERD (gastroesophageal reflux disease)   . Hyperlipidemia 08/06/11   lexiscan myoview-normal. Due to severe attenuation the study specificity and sensitivty are deminished.  . Hypertension 08/25/10   ECHO-EF 60-65%  . Hypothyroidism   . Irregular heart beat   . Shortness of breath   . Sleep apnea    SLEEP STUDY- [facility]  . Thyroid disease     Past Surgical History:  Procedure Laterality Date  . CARDIAC CATHETERIZATION  07/06/01   spade-like configuration  . CARDIOVERSION N/A 06/07/2017   Procedure: CARDIOVERSION;  Surgeon: [name], MD;  Location: [facility];  Service: Cardiovascular;  Laterality: N/A;  . DILATION AND CURETTAGE OF UTERUS    . EYE SURGERY    . KNEE ARTHROSCOPY    . PERMANENT PACEMAKER INSERTION N/A 07/20/2013   Procedure: PERMANENT PACEMAKER INSERTION;  Surgeon: Dani Gobble Marti Mclane,  MD;  Location: [facility];  Service: Cardiovascular;  Laterality: N/A;    Current Medications: Outpatient Medications Prior to Visit  Medication Sig Dispense Refill  . acetaminophen (TYLENOL) 500 MG tablet Take 500 mg by mouth every 6 (six) hours as needed for moderate pain, fever or headache.     Marland Kitchen amiodarone (PACERONE) 200 MG tablet Take 1 tablet (200 mg total) by mouth 2 (two) times daily. 180  tablet 3  . atorvastatin (LIPITOR) 20 MG tablet Take 0.5 tablets (10 mg total) by mouth daily. 45 tablet 3  . Cholecalciferol (VITAMIN D) 2000 UNITS tablet Take 2,000 Units by mouth daily.    Marland Kitchen dexlansoprazole (DEXILANT) 60 MG capsule Take 60 mg by mouth daily.    Marland Kitchen docusate sodium (COLACE) 100 MG capsule Take 100 mg by mouth 2 (two) times daily.    Marland Kitchen ELIQUIS 5 MG TABS tablet TAKE 1 TABLET BY MOUTH TWO  TIMES DAILY 180 tablet 1  . escitalopram (LEXAPRO) 5 MG tablet Take 5 mg by mouth daily.    . hydrochlorothiazide (MICROZIDE) 12.5 MG capsule Take 1 capsule (12.5 mg total) by mouth daily. 90 capsule 3  . levothyroxine (SYNTHROID, LEVOTHROID) 88 MCG tablet Take 88 mcg by mouth daily before breakfast.     . Liniments (DEEP BLUE RELIEF EX) Apply 1 application topically daily as needed (for back, shoulder and knee pain).    . metoprolol tartrate (LOPRESSOR) 50 MG tablet TAKE 1 TABLET BY MOUTH TWO  TIMES DAILY 180 tablet 3  . Multiple Vitamins-Minerals (PRESERVISION AREDS 2 PO) Take 2 tablets by mouth daily.    . mupirocin ointment (BACTROBAN) 2 % Place 1 application into the nose daily as needed (for dry nose).    Marland Kitchen OVER THE COUNTER MEDICATION Place 1 drop into both ears daily. Organic Ear Oil    . oxybutynin (DITROPAN) 5 MG tablet Take 5 mg by mouth 2 (two) times daily.     . sodium chloride (OCEAN) 0.65 % SOLN nasal spray Place 1 spray into both nostrils as needed for congestion.    . traMADol (ULTRAM) 50 MG tablet Take 100 mg by mouth 2 (two) times daily.     . metoprolol tartrate (LOPRESSOR) 50 MG tablet TAKE ONE TABLET BY MOUTH TWICE DAILY 180 tablet 1  . amiodarone (PACERONE) 200 MG tablet TAKE 1 TABLET BY MOUTH  DAILY 90 tablet 3   No facility-administered medications prior to visit.      Allergies:   Penicillins and Tetanus toxoid   Social History   Socioeconomic History  . Marital status: Married    Spouse name: Not on file  . Number of children: Not on file  . Years of education:  Not on file  . Highest education level: Not on file  Occupational History  . Not on file  Social Needs  . Financial resource strain: Not on file  . Food insecurity:    Worry: Not on file    Inability: Not on file  . Transportation needs:    Medical: Not on file    Non-medical: Not on file  Tobacco Use  . Smoking status: Never Smoker  . Smokeless tobacco: Never Used  Substance and Sexual Activity  . Alcohol use: No    Alcohol/week: 0.0 standard drinks  . Drug use: No  . Sexual activity: Never  Lifestyle  . Physical activity:    Days per week: Not on file    Minutes per session: Not on file  . Stress:  Not on file  Relationships  . Social connections:    Talks on phone: Not on file    Gets together: Not on file    Attends religious service: Not on file    Active member of club or organization: Not on file    Attends meetings of clubs or organizations: Not on file    Relationship status: Not on file  Other Topics Concern  . Not on file  Social History Narrative  . Not on file     Family History:  The patient's family history includes Diabetes in her father and mother; Heart attack in her maternal grandfather and maternal grandmother; Hypertension in her father.   ROS:   Please see the history of present illness.    ROS All other systems reviewed and are negative.   PHYSICAL EXAM:   VS:  BP 138/82   Pulse 89   Ht 5\' 3"  (1.6 m)   Wt 170 lb (77.1 kg)   BMI 30.11 kg/m      General: Alert, oriented x3, no distress, healthy left subclavian pacemaker site Head: no evidence of trauma, PERRL, EOMI, no exophtalmos or lid lag, no myxedema, no xanthelasma; normal ears, nose and oropharynx Neck: normal jugular venous pulsations and no hepatojugular reflux; brisk carotid pulses without delay and no carotid bruits Chest: clear to auscultation, no signs of consolidation by percussion or palpation, normal fremitus, symmetrical and full respiratory excursions Cardiovascular:  normal position and quality of the apical impulse, irregular rhythm, normal first and second heart sounds, no murmurs, rubs or gallops Abdomen: no tenderness or distention, no masses by palpation, no abnormal pulsatility or arterial bruits, normal bowel sounds, no hepatosplenomegaly Extremities: no clubbing, cyanosis or edema; 2+ radial, ulnar and brachial pulses bilaterally; 2+ right femoral, posterior tibial and dorsalis pedis pulses; 2+ left femoral, posterior tibial and dorsalis pedis pulses; no subclavian or femoral bruits Neurological: grossly nonfocal Psych: Normal mood and affect    Wt Readings from Last 3 Encounters:  03/30/18 170 lb (77.1 kg)  03/09/18 170 lb 6.4 oz (77.3 kg)  09/14/17 169 lb 9.6 oz (76.9 kg)      Studies/Labs Reviewed:   EKG:  EKG is ordered today.   It shows atrial fibrillation with occasional ventricular paced beats but mostly native AV conduction.  RSR prime pattern in V1, ST depression and T wave inversion in the inferior leads as well as in most anterolateral leads, prolonged QTC 484 ms consistent with amiodarone.  The tracing is similar to the one performed in December at her visit with [name] Recent Labs: 06/01/2017: BUN 20; Creatinine, Ser 1.36; Hemoglobin 14.6; Platelets 187; Potassium 5.2; Sodium 137; TSH 2.630   Lipid Panel    Component Value Date/Time   CHOL 120 07/19/2013 0539   TRIG 52 07/19/2013 0539   HDL 45 07/19/2013 0539   CHOLHDL 2.7 07/19/2013 0539   VLDL 10 07/19/2013 0539   LDLCALC 65 07/19/2013 0539    Additional studies/ records that were reviewed today include:  Notes from [name]  ASSESSMENT:    1. Other persistent atrial fibrillation   2. SSS (sick sinus syndrome) ([place])   3. Pacemaker   4. Apical variant hypertrophic cardiomyopathy ([place])   5. Encounter for monitoring amiodarone therapy   6. Long term current use of anticoagulant   7. Essential hypertension      PLAN:  In order of problems listed  above:  1. AFib: Rate is well controlled and she is fully anticoagulated,  but she is symptomatic. CHADSVasc 4 (age, gender, hypertension).  She has been on increased dose of amiodarone for about a month now.  She has been compliant with anticoagulation.  I recommended another attempted cardioversion.  This procedure has been fully reviewed with the patient and written informed consent has been obtained.  However, if she has recurrence of atrial fibrillation even on the higher dose of amiodarone, few options are available and we may have to commit to permanent atrial fibrillation strategy.  If that is the case, would recommend stopping the amiodarone permanently due to his high side effect profile. 2. SSS: prior to the onset of atrial fibrillation the underlying rhythm was severe sinus bradycardia in the 30s so she had virtually 100% atrial pacing. 3. PPM: Normal device function.  Continue remote downloads every 3 months and yearly office visits. 4. Hypertrophic cardiomyopathy: Suspected of having apical variant hypertrophic cardiomyopathy.  She does not have evidence of outflow tract obstruction either on physical exam or previous echocardiography. 5. Amiodarone: Labs checked by Dr. [name] in October showed TSH of 3.36, normal liver function test, creatinine 0.9. 6. Anticoagulation: Without falls or bleeding complications. 7. HTN: She has fair even if not perfect blood pressure control.    Medication Adjustments/Labs and Tests Ordered: Current medicines are reviewed at length with the patient today.  Concerns regarding medicines are outlined above.  Medication changes, Labs and Tests ordered today are listed in the Patient Instructions below. Patient Instructions   [place] [address] Dept: [phone] Loc: [place]  03/30/2018   You are scheduled for a  TEE/Cardioversion/TEE Cardioversion on Thursday, January 23rd, 2020 with Dr. Sallyanne Kuster.  Please arrive at the [facility] (Main Entrance A) at [facility]: [address] [place], [place] [address] at 12:30 pm.   DIET: Nothing to eat or drink after midnight except a sip of water with medications (see medication instructions below)  Medication Instructions: Continue your anticoagulant: Eliquis You will need to continue your anticoagulant after your procedure until you are told by your provider that it is safe to stop   Labs: Completed TODAY  You must have a responsible person to drive you home and stay in the waiting area during your procedure. Failure to do so could result in cancellation.  Bring your insurance cards.  *Special Note: Every effort is made to have your procedure done on time. Occasionally there are emergencies that occur at the hospital that may cause delays. Please be patient if a delay does occur.      Signed, [name], MD  03/30/2018 4:18 PM    [facility] [place], [place], [place]  [address] Phone: [phone]; Fax: [phone]

## 2018-03-30 NOTE — Progress Notes (Signed)
ekg 

## 2018-03-30 NOTE — Patient Instructions (Signed)
  [place] [address] [place] [address] Dept: [phone] Loc: [place]  03/30/2018   You are scheduled for a TEE/Cardioversion/TEE Cardioversion on Thursday, January 23rd, 2020 with Dr. Sallyanne Kuster.  Please arrive at the [facility] (Main Entrance A) at [facility]: [address] [place], [place] [address] at 12:30 pm.   DIET: Nothing to eat or drink after midnight except a sip of water with medications (see medication instructions below)  Medication Instructions: Continue your anticoagulant: Eliquis You will need to continue your anticoagulant after your procedure until you are told by your provider that it is safe to stop   Labs: Completed TODAY  You must have a responsible person to drive you home and stay in the waiting area during your procedure. Failure to do so could result in cancellation.  Bring your insurance cards.  *Special Note: Every effort is made to have your procedure done on time. Occasionally there are emergencies that occur at the hospital that may cause delays. Please be patient if a delay does occur.

## 2018-03-31 ENCOUNTER — Encounter: Payer: Self-pay | Admitting: Cardiovascular Disease

## 2018-03-31 LAB — CBC
Hematocrit: 42.2 % (ref 34.0–46.6)
Hemoglobin: 14.2 g/dL (ref 11.1–15.9)
MCH: 29.8 pg (ref 26.6–33.0)
MCHC: 33.6 g/dL (ref 31.5–35.7)
MCV: 89 fL (ref 79–97)
Platelets: 191 10*3/uL (ref 150–450)
RBC: 4.77 x10E6/uL (ref 3.77–5.28)
RDW: 14.1 % (ref 11.7–15.4)
WBC: 5.4 10*3/uL (ref 3.4–10.8)

## 2018-03-31 LAB — BASIC METABOLIC PANEL
BUN/Creatinine Ratio: 13 (ref 12–28)
BUN: 13 mg/dL (ref 8–27)
CO2: 27 mmol/L (ref 20–29)
Calcium: 8.8 mg/dL (ref 8.7–10.3)
Chloride: 91 mmol/L — ABNORMAL LOW (ref 96–106)
Creatinine, Ser: 0.98 mg/dL (ref 0.57–1.00)
GFR calc Af Amer: 60 mL/min/{1.73_m2} (ref >59)
GFR calc non Af Amer: 52 mL/min/{1.73_m2} — ABNORMAL LOW (ref >59)
Glucose: 67 mg/dL (ref 65–99)
Potassium: 3.8 mmol/L (ref 3.5–5.2)
Sodium: 134 mmol/L (ref 134–144)

## 2018-04-03 LAB — CUP PACEART REMOTE DEVICE CHECK
Battery Remaining Longevity: 46 mo
Battery Voltage: 2.99 V
Brady Statistic AP VP Percent: 2.38 %
Brady Statistic AS VS Percent: 46.6 %
Brady Statistic RA Percent Paced: 30.48 %
Implantable Lead Implant Date: 20150430
Implantable Lead Implant Date: 20150430
Implantable Lead Location: 753859
Implantable Lead Location: 753860
Implantable Lead Model: 5076
Implantable Lead Model: 5076
Implantable Pulse Generator Implant Date: 20150430
Lead Channel Impedance Value: 361 Ohm
Lead Channel Impedance Value: 437 Ohm
Lead Channel Pacing Threshold Amplitude: 0.875 V
Lead Channel Pacing Threshold Amplitude: 1.125 V
Lead Channel Pacing Threshold Pulse Width: 0.4 ms
Lead Channel Pacing Threshold Pulse Width: 0.4 ms
Lead Channel Sensing Intrinsic Amplitude: 0.875 mV
Lead Channel Sensing Intrinsic Amplitude: 0.875 mV
Lead Channel Setting Pacing Pulse Width: 0.4 ms
MDC IDC MSMT LEADCHNL RV IMPEDANCE VALUE: 532 Ohm
MDC IDC MSMT LEADCHNL RV IMPEDANCE VALUE: 570 Ohm
MDC IDC MSMT LEADCHNL RV SENSING INTR AMPL: 31.625 mV
MDC IDC MSMT LEADCHNL RV SENSING INTR AMPL: 31.625 mV
MDC IDC SESS DTM: 20191112213840
MDC IDC SET LEADCHNL RA PACING AMPLITUDE: 2.25 V
MDC IDC SET LEADCHNL RV PACING AMPLITUDE: 2.5 V
MDC IDC SET LEADCHNL RV SENSING SENSITIVITY: 4 mV
MDC IDC STAT BRADY AP VS PERCENT: 31.96 %
MDC IDC STAT BRADY AS VP PERCENT: 19.06 %
MDC IDC STAT BRADY RV PERCENT PACED: 20.05 %

## 2018-04-12 ENCOUNTER — Telehealth: Payer: Self-pay | Admitting: Cardiovascular Disease

## 2018-04-12 NOTE — Telephone Encounter (Signed)
SPOKE TO PATIENT. SHE WANTED TO KNOW  IF SHE WOULD NEED A CARE GIVER AFTER CARDIOVERSION.  RN INFORMED PATIENT IT IS  RECOMMEND FOR  PATIENT TO HAVE SOMEONE THERE.  SHE VERBALIZED UNDERSTANDING.

## 2018-04-12 NOTE — Telephone Encounter (Signed)
° ° °  [name] calling on behalf of patient. The patient is requesting instructions for upcoming procedure. She is more concerns with the after care process and how to prepare. Please call

## 2018-04-14 ENCOUNTER — Encounter (HOSPITAL_COMMUNITY): Payer: Self-pay | Admitting: *Deleted

## 2018-04-14 ENCOUNTER — Ambulatory Visit (HOSPITAL_COMMUNITY): Payer: Medicare Other | Admitting: Anesthesiology

## 2018-04-14 ENCOUNTER — Ambulatory Visit (HOSPITAL_COMMUNITY)
Admission: RE | Admit: 2018-04-14 | Discharge: 2018-04-14 | Disposition: A | Discharge status: Home / Self Care | Payer: Medicare Other | Attending: Cardiovascular Disease | Admitting: Cardiovascular Disease

## 2018-04-14 ENCOUNTER — Other Ambulatory Visit: Payer: Self-pay

## 2018-04-14 ENCOUNTER — Encounter (HOSPITAL_COMMUNITY)
Admission: RE | Disposition: A | Discharge status: Home / Self Care | Payer: Self-pay | Source: Home / Self Care | Attending: Cardiovascular Disease

## 2018-04-14 DIAGNOSIS — E039 Hypothyroidism, unspecified: Secondary | ICD-10-CM | POA: Insufficient documentation

## 2018-04-14 DIAGNOSIS — Z887 Allergy status to serum and vaccine status: Secondary | ICD-10-CM | POA: Diagnosis not present

## 2018-04-14 DIAGNOSIS — Z955 Presence of coronary angioplasty implant and graft: Secondary | ICD-10-CM | POA: Insufficient documentation

## 2018-04-14 DIAGNOSIS — M1990 Unspecified osteoarthritis, unspecified site: Secondary | ICD-10-CM | POA: Diagnosis not present

## 2018-04-14 DIAGNOSIS — K219 Gastro-esophageal reflux disease without esophagitis: Secondary | ICD-10-CM | POA: Insufficient documentation

## 2018-04-14 DIAGNOSIS — E785 Hyperlipidemia, unspecified: Secondary | ICD-10-CM | POA: Insufficient documentation

## 2018-04-14 DIAGNOSIS — Z880 Allergy status to penicillin: Secondary | ICD-10-CM | POA: Diagnosis not present

## 2018-04-14 DIAGNOSIS — Z7901 Long term (current) use of anticoagulants: Secondary | ICD-10-CM | POA: Diagnosis not present

## 2018-04-14 DIAGNOSIS — I422 Other hypertrophic cardiomyopathy: Secondary | ICD-10-CM | POA: Diagnosis not present

## 2018-04-14 DIAGNOSIS — G4730 Sleep apnea, unspecified: Secondary | ICD-10-CM | POA: Diagnosis not present

## 2018-04-14 DIAGNOSIS — Z79890 Hormone replacement therapy (postmenopausal): Secondary | ICD-10-CM | POA: Insufficient documentation

## 2018-04-14 DIAGNOSIS — I10 Essential (primary) hypertension: Secondary | ICD-10-CM | POA: Diagnosis not present

## 2018-04-14 DIAGNOSIS — Z950 Presence of cardiac pacemaker: Secondary | ICD-10-CM | POA: Diagnosis not present

## 2018-04-14 DIAGNOSIS — Z8249 Family history of ischemic heart disease and other diseases of the circulatory system: Secondary | ICD-10-CM | POA: Diagnosis not present

## 2018-04-14 DIAGNOSIS — I495 Sick sinus syndrome: Secondary | ICD-10-CM | POA: Insufficient documentation

## 2018-04-14 DIAGNOSIS — I4819 Other persistent atrial fibrillation: Secondary | ICD-10-CM | POA: Diagnosis not present

## 2018-04-14 DIAGNOSIS — Z79899 Other long term (current) drug therapy: Secondary | ICD-10-CM | POA: Diagnosis not present

## 2018-04-14 DIAGNOSIS — T50905A Adverse effect of unspecified drugs, medicaments and biological substances, initial encounter: Secondary | ICD-10-CM

## 2018-04-14 DIAGNOSIS — R001 Bradycardia, unspecified: Secondary | ICD-10-CM

## 2018-04-14 DIAGNOSIS — I4891 Unspecified atrial fibrillation: Secondary | ICD-10-CM | POA: Diagnosis not present

## 2018-04-14 HISTORY — DX: Family history of other specified conditions: Z84.89

## 2018-04-14 HISTORY — PX: CARDIOVERSION: SHX1299

## 2018-04-14 SURGERY — CARDIOVERSION
Anesthesia: General

## 2018-04-14 MED ORDER — SODIUM CHLORIDE 0.9 % IV SOLN
INTRAVENOUS | Status: DC
  Administered 2018-04-14: 500 mL via INTRAVENOUS

## 2018-04-14 MED ORDER — PROPOFOL 10 MG/ML IV BOLUS
INTRAVENOUS | Status: DC | PRN
  Administered 2018-04-14: 60 mg via INTRAVENOUS

## 2018-04-14 MED ORDER — LIDOCAINE 2% (20 MG/ML) 5 ML SYRINGE
INTRAMUSCULAR | Status: DC | PRN
  Administered 2018-04-14: 60 mg via INTRAVENOUS

## 2018-04-14 MED ORDER — SODIUM CHLORIDE 0.9 % IV SOLN
INTRAVENOUS | Status: DC | PRN
  Administered 2018-04-14: 13:00:00 via INTRAVENOUS

## 2018-04-14 NOTE — Op Note (Signed)
Procedure: Electrical Cardioversion Indications:  Atrial Fibrillation  Procedure Details:  Consent: Risks of procedure as well as the alternatives and risks of each were explained to the (patient/caregiver).  Consent for procedure obtained.  Time Out: Verified patient identification, verified procedure, site/side was marked, verified correct patient position, special equipment/implants available, medications/allergies/relevent history reviewed, required imaging and test results available.  Performed  Patient placed on cardiac monitor, pulse oximetry, supplemental oxygen as necessary.  Sedation given: Propofol 60 mg IV, Dr. Tobias Alexander Pacer pads placed anterior and posterior chest.  Cardioverted 1 time(s).  Cardioversion with synchronized biphasic 120J shock.  Evaluation: Findings: Post procedure EKG shows: A paced V paced Complications: None Patient did tolerate procedure well. Normal device function after procedure.  Time Spent Directly with the Patient:  30 minutes   Aleczander Fandino 04/14/2018, 1:10 PM

## 2018-04-14 NOTE — Discharge Instructions (Signed)
Electrical Cardioversion, Care After °This sheet gives you information about how to care for yourself after your procedure. Your health care provider may also give you more specific instructions. If you have problems or questions, contact your health care provider. °What can I expect after the procedure? °After the procedure, it is common to have: °· Some redness on the skin where the shocks were given. °Follow these instructions at home: ° °· Do not drive for 24 hours if you were given a medicine to help you relax (sedative). °· Take over-the-counter and prescription medicines only as told by your health care provider. °· Ask your health care provider how to check your pulse. Check it often. °· Rest for 48 hours after the procedure or as told by your health care provider. °· Avoid or limit your caffeine use as told by your health care provider. °Contact a health care provider if: °· You feel like your heart is beating too quickly or your pulse is not regular. °· You have a serious muscle cramp that does not go away. °Get help right away if: ° °· You have discomfort in your chest. °· You are dizzy or you feel faint. °· You have trouble breathing or you are short of breath. °· Your speech is slurred. °· You have trouble moving an arm or leg on one side of your body. °· Your fingers or toes turn cold or blue. °This information is not intended to replace advice given to you by your health care provider. Make sure you discuss any questions you have with your health care provider. °Document Released: 12/28/2012 Document Revised: 10/11/2015 Document Reviewed: 09/13/2015 °Elsevier Interactive Patient Education © 2019 [employer] Inc. ° °

## 2018-04-14 NOTE — Transfer of Care (Signed)
Immediate Anesthesia Transfer of Care Note  Patient: Denise Pugh  Procedure(s) Performed: CARDIOVERSION (N/A )  Patient Location: Endoscopy Unit  Anesthesia Type:General  Level of Consciousness: drowsy  Airway & Oxygen Therapy: Patient Spontanous Breathing and Patient connected to face mask oxygen  Post-op Assessment: Report given to RN and Post -op Vital signs reviewed and stable  Post vital signs: Reviewed and stable  Last Vitals:  Vitals Value Taken Time  BP 128/65   Temp    Pulse 80   Resp 17   SpO2 92%     Last Pain:  Vitals:   04/14/18 1244  TempSrc: Oral  PainSc: 0-No pain         Complications: No apparent anesthesia complications

## 2018-04-14 NOTE — Interval H&P Note (Signed)
History and Physical Interval Note:  04/14/2018 12:37 PM  Denise Pugh  has presented today for surgery, with the diagnosis of AFIB  The various methods of treatment have been discussed with the patient and family. After consideration of risks, benefits and other options for treatment, the patient has consented to  Procedure(s): CARDIOVERSION (N/A) as a surgical intervention .  The patient's history has been reviewed, patient examined, no change in status, stable for surgery.  I have reviewed the patient's chart and labs.  Questions were answered to the patient's satisfaction.     Lil Lepage

## 2018-04-14 NOTE — Anesthesia Postprocedure Evaluation (Signed)
Anesthesia Post Note  Patient: Denise Pugh  Procedure(s) Performed: CARDIOVERSION (N/A )     Patient location during evaluation: Endoscopy Anesthesia Type: General Level of consciousness: awake and alert Pain management: pain level controlled Vital Signs Assessment: post-procedure vital signs reviewed and stable Respiratory status: spontaneous breathing, nonlabored ventilation, respiratory function stable and patient connected to nasal cannula oxygen Cardiovascular status: blood pressure returned to baseline and stable Postop Assessment: no apparent nausea or vomiting Anesthetic complications: no    Last Vitals:  Vitals:   04/14/18 1330 04/14/18 1340  BP: (!) 126/55 (!) 142/79  Pulse: 66 60  Resp: (!) 24 18  Temp:    SpO2: 100% 100%    Last Pain:  Vitals:   04/14/18 1340  TempSrc:   PainSc: 0-No pain                 Dennice Tindol DANIEL

## 2018-04-14 NOTE — Anesthesia Preprocedure Evaluation (Signed)
Anesthesia Evaluation  Patient identified by MRN, date of birth, ID band Patient awake    Reviewed: Allergy & Precautions, NPO status , Patient's Chart, lab work & pertinent test results  History of Anesthesia Complications Negative for: history of anesthetic complications  Airway Mallampati: II  TM Distance: >3 FB Neck ROM: Full    Dental no notable dental hx.    Pulmonary sleep apnea ,    Pulmonary exam normal breath sounds clear to auscultation       Cardiovascular hypertension, Normal cardiovascular exam+ dysrhythmias Atrial Fibrillation  Rhythm:Regular Rate:Normal  Study Conclusions  - Left ventricle: The cavity size was normal. There was mild   concentric hypertrophy. Systolic function was normal. The   estimated ejection fraction was in the range of 60% to 65%. Wall   motion was normal; there were no regional wall motion   abnormalities. There was a reduced contribution of atrial   contraction to ventricular filling, due to increased ventricular   diastolic pressure or atrial contractile dysfunction. Doppler   parameters are consistent with a reversible restrictive pattern,   indicative of decreased left ventricular diastolic compliance   and/or increased left atrial pressure (grade 3 diastolic   dysfunction). Doppler parameters are consistent with high   ventricular filling pressure. - Aortic valve: Severely calcified annulus. Trileaflet; moderately   thickened, moderately calcified leaflets. There was moderate   regurgitation. - Mitral valve: Calcified annulus. There was mild to moderate   regurgitation. - Left atrium: The atrium was moderately dilated. - Tricuspid valve: There was mild regurgitation. - Pulmonary arteries: PA peak pressure: 38 mm Hg (S).  Impressions:  - The right ventricular systolic pressure was increased consistent   with mild pulmonary hypertension.   Neuro/Psych PSYCHIATRIC DISORDERS  Depression negative neurological ROS     GI/Hepatic Neg liver ROS, GERD  ,  Endo/Other  Hypothyroidism   Renal/GU negative Renal ROS     Musculoskeletal   Abdominal   Peds  Hematology negative hematology ROS (+)   Anesthesia Other Findings   Reproductive/Obstetrics                             Anesthesia Physical  Anesthesia Plan  ASA: III  Anesthesia Plan: General   Post-op Pain Management:    Induction: Intravenous  PONV Risk Score and Plan:   Airway Management Planned: Mask  Additional Equipment:   Intra-op Plan:   Post-operative Plan:   Informed Consent: I have reviewed the patients History and Physical, chart, labs and discussed the procedure including the risks, benefits and alternatives for the proposed anesthesia with the patient or authorized representative who has indicated his/her understanding and acceptance.       Plan Discussed with: CRNA  Anesthesia Plan Comments:         Anesthesia Quick Evaluation

## 2018-04-15 ENCOUNTER — Telehealth: Payer: Self-pay | Admitting: *Deleted

## 2018-04-15 NOTE — Telephone Encounter (Signed)
Left message for patient to call and schedule 4-6 week post cardioversion appointment with Dr. C--[name] or an app---Prefers Tuesday or Thursday afternoon.

## 2018-04-17 ENCOUNTER — Encounter (HOSPITAL_COMMUNITY): Payer: Self-pay | Admitting: Cardiovascular Disease

## 2018-04-19 ENCOUNTER — Encounter: Payer: Self-pay | Admitting: Cardiovascular Disease

## 2018-04-19 NOTE — Telephone Encounter (Signed)
Left message for patient to call and schedule 4-6 week appt with [name] or APP

## 2018-04-25 ENCOUNTER — Telehealth: Payer: Self-pay | Admitting: Cardiovascular Disease

## 2018-04-25 NOTE — Telephone Encounter (Signed)
°  New message     STAT if patient feels like he/she is going to faint    1) Are you dizzy now? No   2) Do you feel faint or have you passed out? No   3) Do you have any other symptoms? Pt feels weak, cant hardly stand  , shaking since she has cardioversion   4) Have you checked your HR and BP (record if available)? No

## 2018-04-25 NOTE — Telephone Encounter (Signed)
Spoke with pt who was noticeably tearful over the phone regarding her symptoms she states presented after recent cardioversion. Pt c/o generalized weakness/fatigue, tiredness, shakiness, and lightheadedness that is more severe with positional changes. Pt states she does not have a BP monitor at home to obtain vitals for eval. She also states that she does not want to report to ED because she will be 'sitting and waiting for a long time'. Consulted Dr. Sallyanne Kuster and recommendation is to schedule OV for 04/26/2018 at 1000. Pt agreeable with this and has been scheduled accordingly

## 2018-04-26 ENCOUNTER — Telehealth: Payer: Self-pay | Admitting: Cardiovascular Disease

## 2018-04-26 ENCOUNTER — Encounter: Payer: Self-pay | Admitting: Cardiovascular Disease

## 2018-04-26 ENCOUNTER — Ambulatory Visit: Payer: Medicare Other | Admitting: Cardiovascular Disease

## 2018-04-26 VITALS — BP 142/80 | HR 105 | Ht 63.0 in | Wt 169.4 lb

## 2018-04-26 DIAGNOSIS — E039 Hypothyroidism, unspecified: Secondary | ICD-10-CM

## 2018-04-26 DIAGNOSIS — I495 Sick sinus syndrome: Secondary | ICD-10-CM | POA: Diagnosis not present

## 2018-04-26 DIAGNOSIS — I10 Essential (primary) hypertension: Secondary | ICD-10-CM

## 2018-04-26 DIAGNOSIS — I4819 Other persistent atrial fibrillation: Secondary | ICD-10-CM

## 2018-04-26 DIAGNOSIS — I422 Other hypertrophic cardiomyopathy: Secondary | ICD-10-CM | POA: Diagnosis not present

## 2018-04-26 DIAGNOSIS — I480 Paroxysmal atrial fibrillation: Secondary | ICD-10-CM | POA: Diagnosis not present

## 2018-04-26 DIAGNOSIS — I4891 Unspecified atrial fibrillation: Secondary | ICD-10-CM | POA: Diagnosis not present

## 2018-04-26 DIAGNOSIS — Z7901 Long term (current) use of anticoagulants: Secondary | ICD-10-CM

## 2018-04-26 DIAGNOSIS — Z950 Presence of cardiac pacemaker: Secondary | ICD-10-CM | POA: Insufficient documentation

## 2018-04-26 LAB — CUP PACEART INCLINIC DEVICE CHECK
Date Time Interrogation Session: 20200204144545
Implantable Lead Implant Date: 20150430
Implantable Lead Implant Date: 20150430
Implantable Lead Location: 753859
Implantable Lead Location: 753860
Implantable Lead Model: 5076
Implantable Lead Model: 5076
MDC IDC PG IMPLANT DT: 20150430

## 2018-04-26 MED ORDER — METOPROLOL TARTRATE 50 MG PO TABS: 75.0000 mg | ORAL_TABLET | Freq: Two times a day (BID) | ORAL | 6 refills | Status: DC

## 2018-04-26 MED ORDER — METOPROLOL TARTRATE 75 MG PO TABS: 50.0000 mg | ORAL_TABLET | Freq: Two times a day (BID) | ORAL | 6 refills | Status: DC

## 2018-04-26 NOTE — Patient Instructions (Signed)
Medication Instructions:  Stop: Amiodarone Increase: Metoprolol 75 mg (1 1/2 tablet) two times day If you need a refill on your cardiac medications before your next appointment, please call your pharmacy.   Lab work: Your physician recommends that you return for lab work today (TSH, Free T4)  If you have labs (blood work) drawn today and your tests are completely normal, you will receive your results only by: Marland Kitchen MyChart Message (if you have MyChart) OR . A paper copy in the mail If you have any lab test that is abnormal or we need to change your treatment, we will call you to review the results.  Testing/Procedures: None  Follow-Up: At [facility], you and your health needs are our priority.  As part of our continuing mission to provide you with exceptional heart care, we have created designated Provider Care Teams.  These Care Teams include your primary Cardiologist (physician) and Advanced Practice Providers (APPs -  Physician Assistants and Nurse Practitioners) who all work together to provide you with the care you need, when you need it. You will need a follow up appointment in 3 months.  Please call our office 2 months in advance to schedule this appointment.  You may see Dr. Sallyanne Kuster or one of the following Advanced Practice Providers on your designated Care Team: Almyra Deforest, [place] . Fabian Sharp, PA-C

## 2018-04-26 NOTE — Telephone Encounter (Signed)
New message   Per Vaughan Basta need to return call to verify dose on Metoprolol tartrate.

## 2018-04-26 NOTE — Progress Notes (Signed)
Cardiology Office Note    Date:  04/26/2018   ID:  Denise Pugh, DOB March 31, 1928, MRN [id number]  PCP:  [name], MD  Cardiologist:  [name], M.D.; [name], MD   No chief complaint on file. Pacemaker check  History of Present Illness:  Denise Pugh is a 85 y.o. female with a history of sinus node dysfunction here for pacemaker follow-up. She is followed by [name] for hypertension, hyperlipidemia, sleep apnea. She has moderate LVH with a configuration suggesting apical variant hypertrophic cardiomyopathy. She has previously had paroxysmal atrial fibrillation (2015) and takes amiodarone and Eliquis.  She underwent a cardioversion in March 2019  with symptomatic improvement over the summer.  Pacemaker check shows that she return to atrial fibrillation in early September.  [name] increased the dose of amiodarone to 400 mg daily at his last appointment in December and she had a successful electrical cardioversion on April 14, 2018.  Despite this she is feeling worse.  She woke up the next morning already feeling weaker than she had before the cardioversion.  Interrogation of her pacemaker shows that she only had normal rhythm for very brief period, definitely less than 24 hours.    Today she has atrial fibrillation rapid ventricular response with a rate of 105 bpm at rest.  Primarily, she complains of fatigue and denies dyspnea or syncope.  The device does demonstrate a marked increase in average ventricular rates and marked decrease in the need for ventricular pacing, that occurred in the last 3 to 4 days.  She reports that her thyroid dose was increased slightly a couple of months ago at her last appointment with Dr. [name].  The most recent TSH I have available is from October 17, TSH normal at 3.36.  Otherwise pacemaker interrogation shows normal device function.  Generator longevity is approximately 3.5 years.  Lead parameters remain excellent (of course, could not check  atrial pacing threshold).   Past Medical History:  Diagnosis Date  . Arrhythmia    HX of SVT. CARDIONET MONITOR 07/15/12 TO 08/13/12  . Arthritis   . Family history of adverse reaction to anesthesia 1970s   mother  . GERD (gastroesophageal reflux disease)   . Hyperlipidemia 08/06/11   lexiscan myoview-normal. Due to severe attenuation the study specificity and sensitivty are deminished.  . Hypertension 08/25/10   ECHO-EF 60-65%  . Hypothyroidism   . Irregular heart beat   . Shortness of breath   . Sleep apnea    SLEEP STUDY-[facility]  . Thyroid disease     Past Surgical History:  Procedure Laterality Date  . CARDIAC CATHETERIZATION  07/06/01   spade-like configuration  . CARDIOVERSION N/A 06/07/2017   Procedure: CARDIOVERSION;  Surgeon: [name], MD;  Location: [facility];  Service: Cardiovascular;  Laterality: N/A;  . CARDIOVERSION N/A 04/14/2018   Procedure: CARDIOVERSION;  Surgeon: [name], MD;  Location: [facility];  Service: Cardiovascular;  Laterality: N/A;  . DILATION AND CURETTAGE OF UTERUS    . EYE SURGERY    . KNEE ARTHROSCOPY    . PERMANENT PACEMAKER INSERTION N/A 07/20/2013   Procedure: PERMANENT PACEMAKER INSERTION;  Surgeon: [name], MD;  Location: [facility];  Service: Cardiovascular;  Laterality: N/A;    Current Medications: Outpatient Medications Prior to Visit  Medication Sig Dispense Refill  . acetaminophen (TYLENOL) 500 MG tablet Take 500 mg by mouth every 6 (six) hours as needed for moderate pain, fever or headache.     Marland Kitchen  atorvastatin (LIPITOR) 20 MG tablet Take 0.5 tablets (10 mg total) by mouth daily. 45 tablet 3  . Cholecalciferol (VITAMIN D) 2000 UNITS tablet Take 2,000 Units by mouth daily.    Marland Kitchen dexlansoprazole (DEXILANT) 60 MG capsule Take 60 mg by mouth daily.    Marland Kitchen docusate sodium (COLACE) 100 MG capsule Take 100 mg by mouth 2 (two) times daily.    Marland Kitchen ELIQUIS 5 MG TABS tablet TAKE 1 TABLET BY MOUTH TWO   TIMES DAILY 180 tablet 1  . escitalopram (LEXAPRO) 5 MG tablet Take 5 mg by mouth daily.    . hydrochlorothiazide (MICROZIDE) 12.5 MG capsule Take 1 capsule (12.5 mg total) by mouth daily. 90 capsule 3  . levothyroxine (SYNTHROID, LEVOTHROID) 88 MCG tablet Take 88 mcg by mouth daily before breakfast.     . Liniments (DEEP BLUE RELIEF EX) Apply 1 application topically daily as needed (for back, shoulder and knee pain).    . Multiple Vitamins-Minerals (PRESERVISION AREDS 2 PO) Take 2 tablets by mouth daily.    . mupirocin ointment (BACTROBAN) 2 % Place 1 application into the nose daily as needed (for dry nose).    Marland Kitchen OVER THE COUNTER MEDICATION Place 1 drop into both ears daily. Organic Ear Oil    . oxybutynin (DITROPAN) 5 MG tablet Take 5 mg by mouth 2 (two) times daily.     . sodium chloride (OCEAN) 0.65 % SOLN nasal spray Place 1 spray into both nostrils as needed for congestion.    . traMADol (ULTRAM) 50 MG tablet Take 100 mg by mouth 2 (two) times daily.     Marland Kitchen amiodarone (PACERONE) 200 MG tablet Take 1 tablet (200 mg total) by mouth 2 (two) times daily. 180 tablet 3  . metoprolol tartrate (LOPRESSOR) 50 MG tablet TAKE 1 TABLET BY MOUTH TWO  TIMES DAILY 180 tablet 3   No facility-administered medications prior to visit.      Allergies:   Penicillins and Tetanus toxoid   Social History   Socioeconomic History  . Marital status: Widowed    Spouse name: Not on file  . Number of children: Not on file  . Years of education: Not on file  . Highest education level: Not on file  Occupational History  . Not on file  Social Needs  . Financial resource strain: Not on file  . Food insecurity:    Worry: Not on file    Inability: Not on file  . Transportation needs:    Medical: Not on file    Non-medical: Not on file  Tobacco Use  . Smoking status: Never Smoker  . Smokeless tobacco: Never Used  Substance and Sexual Activity  . Alcohol use: No    Alcohol/week: 0.0 standard drinks  .  Drug use: No  . Sexual activity: Never  Lifestyle  . Physical activity:    Days per week: Not on file    Minutes per session: Not on file  . Stress: Not on file  Relationships  . Social connections:    Talks on phone: Not on file    Gets together: Not on file    Attends religious service: Not on file    Active member of club or organization: Not on file    Attends meetings of clubs or organizations: Not on file    Relationship status: Not on file  Other Topics Concern  . Not on file  Social History Narrative  . Not on file     Family History:  The patient's family history includes Diabetes in her father and mother; Heart attack in her maternal grandfather and maternal grandmother; Hypertension in her father.   ROS:   Please see the history of present illness.    ROS all other systems are reviewed and are negative   PHYSICAL EXAM:   VS:  BP (!) 142/80   Pulse (!) 105   Ht 5\' 3"  (1.6 m)   Wt 169 lb 6.4 oz (76.8 kg)   BMI 30.01 kg/m       General: Alert, oriented x3, no distress, obese, healthy left subclavian pacemaker site Head: no evidence of trauma, PERRL, EOMI, no exophtalmos or lid lag, no myxedema, no xanthelasma; normal ears, nose and oropharynx Neck: normal jugular venous pulsations and no hepatojugular reflux; brisk carotid pulses without delay and no carotid bruits Chest: clear to auscultation, no signs of consolidation by percussion or palpation, normal fremitus, symmetrical and full respiratory excursions Cardiovascular: normal position and quality of the apical impulse, rapid irregular rhythm, normal first and second heart sounds, no murmurs, rubs or gallops Abdomen: no tenderness or distention, no masses by palpation, no abnormal pulsatility or arterial bruits, normal bowel sounds, no hepatosplenomegaly Extremities: no clubbing, cyanosis or edema; 2+ radial, ulnar and brachial pulses bilaterally; 2+ right femoral, posterior tibial and dorsalis pedis pulses; 2+  left femoral, posterior tibial and dorsalis pedis pulses; no subclavian or femoral bruits Neurological: grossly nonfocal Psych: Normal mood and affect    Wt Readings from Last 3 Encounters:  04/26/18 169 lb 6.4 oz (76.8 kg)  03/30/18 170 lb (77.1 kg)  03/09/18 170 lb 6.4 oz (77.3 kg)      Studies/Labs Reviewed:   EKG:  EKG is ordered today.  It shows atrial fibrillation rapid ventricular response, incomplete right bundle branch block, diffuse nonspecific ST-T wave changes, very similar to her previous tracing with the exception of increased rate. Recent Labs: 06/01/2017: TSH 2.630 03/30/2018: BUN 13; Creatinine, Ser 0.98; Hemoglobin 14.2; Platelets 191; Potassium 3.8; Sodium 134   Labs from Dr. [name] from January 06, 2018 showed total cholesterol 129, HDL 58, triglycerides 62, hemoglobin 13.9, creatinine 0.98, potassium 3.8, TSH 3.36  Additional studies/ records that were reviewed today include:  Comprehensive pacemaker check  ASSESSMENT:    1. Other persistent atrial fibrillation   2. Tachycardia-bradycardia syndrome ([place])   3. Pacemaker   4. Apical variant hypertrophic cardiomyopathy ([place])   5. Hypothyroidism (acquired)   6. Long term (current) use of anticoagulants   7. Essential hypertension      PLAN:  In order of problems listed above:  1. AFib: I suspect she is feeling poorly due to rapid ventricular rates.  I am not sure why this is occurring just in the last few days.  She does not appear ill and is not in overt heart failure.  I wonder whether is a combination of increased levothyroxine and increased amiodarone dose that is catching up with her.  We will recheck thyroid function tests today.  It appears that we will not be able to return her to normal rhythm.  Despite cardioversion and then increased dose of amiodarone she had extremely brief return to normal rhythm.  I recommended stopping the amiodarone and increasing the metoprolol dose.  As the amiodarone wears off,  we will likely have to increase the beta-blocker further.  She is compliant with anticoagulation.   CHADSVasc 4 (age, gender, hypertension).  If it appears that she has convincingly settled into a pattern of chronic atrial  fibrillation, will switch to VVIR mode at the next office device check.   She has an appointment with Almyra Deforest, PA in 3 weeks and we can further adjust her beta-blocker at that time. 2. SSS: prior to the onset of atrial fibrillation the underlying rhythm was severe sinus bradycardia in the 30s so she had virtually 100% atrial pacing. 3. PPM: Normal device function.  Continue remote downloads every 3 months and yearly office visits. 4. Hypertrophic cardiomyopathy: Suspected of having apical variant hypertrophic cardiomyopathy.  This may lead to poorly tolerated atrial fibrillation even when she has good rate control, but we do not have any other good antiarrhythmic options.  She does not have evidence of outflow tract obstruction either on physical exam or previous echocardiography. 5. Hypothyroidism: Labs checked by Dr. [name] in October showed TSH of 3.36, but since then she has had a change in the dose of levothyroxine and has been taking a higher dose of amiodarone.  Recheck TSH and free T4 today. 6. Anticoagulation: Well-tolerated.  Without falls or bleeding complications. 7. HTN: Borderline high systolic blood pressure, I think she will tolerate a higher dose of beta-blockers without a BP problem..    Medication Adjustments/Labs and Tests Ordered: Current medicines are reviewed at length with the patient today.  Concerns regarding medicines are outlined above.  Medication changes, Labs and Tests ordered today are listed in the Patient Instructions below. Patient Instructions  Medication Instructions:  Stop: Amiodarone Increase: Metoprolol 75 mg (1 1/2 tablet) two times day If you need a refill on your cardiac medications before your next appointment, please call your pharmacy.     Lab work: Your physician recommends that you return for lab work today (TSH, Free T4)  If you have labs (blood work) drawn today and your tests are completely normal, you will receive your results only by: Marland Kitchen MyChart Message (if you have MyChart) OR . A paper copy in the mail If you have any lab test that is abnormal or we need to change your treatment, we will call you to review the results.  Testing/Procedures: None  Follow-Up: At [facility], you and your health needs are our priority.  As part of our continuing mission to provide you with exceptional heart care, we have created designated Provider Care Teams.  These Care Teams include your primary Cardiologist (physician) and Advanced Practice Providers (APPs -  Physician Assistants and Nurse Practitioners) who all work together to provide you with the care you need, when you need it. You will need a follow up appointment in 3 months.  Please call our office 2 months in advance to schedule this appointment.  You may see Dr. Sallyanne Kuster or one of the following Advanced Practice Providers on your designated Care Team: Almyra Deforest, [place] . Fabian Sharp, PA-C        Signed, [name], MD  04/26/2018 10:29 AM    [facility] [address], [place], [place]  [address] Phone: [phone]; Fax: [phone]

## 2018-04-27 ENCOUNTER — Other Ambulatory Visit: Payer: Self-pay

## 2018-04-27 ENCOUNTER — Telehealth: Payer: Self-pay

## 2018-04-27 DIAGNOSIS — I4819 Other persistent atrial fibrillation: Secondary | ICD-10-CM

## 2018-04-27 DIAGNOSIS — E039 Hypothyroidism, unspecified: Secondary | ICD-10-CM

## 2018-04-27 LAB — TSH: TSH: 2.03 u[IU]/mL (ref 0.450–4.500)

## 2018-04-27 LAB — T4, FREE: Free T4: 2.08 ng/dL — ABNORMAL HIGH (ref 0.82–1.77)

## 2018-04-27 MED ORDER — METOPROLOL TARTRATE 50 MG PO TABS: ORAL_TABLET | ORAL | 6 refills | Status: DC

## 2018-04-27 MED ORDER — LEVOTHYROXINE SODIUM 75 MCG PO TABS: 75.0000 ug | ORAL_TABLET | Freq: Every day | ORAL | 3 refills | Status: DC

## 2018-04-27 NOTE — Telephone Encounter (Signed)
Spoke with Pharmacy Rep and verified Metoprolol dose is for 75 mg BID.

## 2018-04-27 NOTE — Telephone Encounter (Signed)
Spoke to patient advised I was calling to verify metoprolol dosage, wrong mg sent to pharmacy.Stated Dr.Croitoru increased metoprolol yesterday to 50 mg take 1&1/2 tablets ( 75 mg )  twice a day.New prescription sent to pharmacy.

## 2018-04-29 LAB — CUP PACEART INCLINIC DEVICE CHECK
Date Time Interrogation Session: 20200207105432
Implantable Lead Implant Date: 20150430
Implantable Lead Implant Date: 20150430
Implantable Lead Location: 753859
Implantable Lead Location: 753860
Implantable Pulse Generator Implant Date: 20150430

## 2018-05-03 ENCOUNTER — Ambulatory Visit: Payer: Medicare Other

## 2018-05-04 ENCOUNTER — Ambulatory Visit (INDEPENDENT_AMBULATORY_CARE_PROVIDER_SITE_OTHER): Payer: Medicare Other

## 2018-05-04 DIAGNOSIS — I495 Sick sinus syndrome: Secondary | ICD-10-CM

## 2018-05-06 ENCOUNTER — Telehealth: Payer: Self-pay | Admitting: Cardiovascular Disease

## 2018-05-06 LAB — CUP PACEART REMOTE DEVICE CHECK
Battery Remaining Longevity: 46 mo
Battery Voltage: 2.98 V
Brady Statistic AP VP Percent: 4.31 %
Brady Statistic AP VS Percent: 0.56 %
Brady Statistic AS VS Percent: 58.45 %
Brady Statistic RA Percent Paced: 4.11 %
Brady Statistic RV Percent Paced: 38.68 %
Date Time Interrogation Session: 20200212182452
Implantable Lead Implant Date: 20150430
Implantable Lead Implant Date: 20150430
Implantable Lead Location: 753859
Implantable Lead Location: 753860
Implantable Lead Model: 5076
Implantable Lead Model: 5076
Implantable Pulse Generator Implant Date: 20150430
Lead Channel Impedance Value: 361 Ohm
Lead Channel Impedance Value: 475 Ohm
Lead Channel Impedance Value: 513 Ohm
Lead Channel Pacing Threshold Amplitude: 1 V
Lead Channel Pacing Threshold Amplitude: 1.125 V
Lead Channel Pacing Threshold Pulse Width: 0.4 ms
Lead Channel Pacing Threshold Pulse Width: 0.4 ms
Lead Channel Sensing Intrinsic Amplitude: 2.25 mV
Lead Channel Sensing Intrinsic Amplitude: 2.25 mV
Lead Channel Sensing Intrinsic Amplitude: 30.875 mV
Lead Channel Setting Pacing Amplitude: 2.25 V
Lead Channel Setting Pacing Amplitude: 2.5 V
Lead Channel Setting Pacing Pulse Width: 0.4 ms
MDC IDC MSMT LEADCHNL RV IMPEDANCE VALUE: 570 Ohm
MDC IDC MSMT LEADCHNL RV SENSING INTR AMPL: 30.875 mV
MDC IDC SET LEADCHNL RV SENSING SENSITIVITY: 4 mV
MDC IDC STAT BRADY AS VP PERCENT: 36.69 %

## 2018-05-06 NOTE — Telephone Encounter (Signed)
Returned call to patient she stated she received lab orders and she will have tsh and free t4 repeated in 2 months.Stated she wanted to let Dr.Croitoru know she feels awful.Stated she has no energy.She just wants to stay in bed all day.Stated she does not notice her heart beating fast.Advised Dr.Croitoru is out of office.Advised I will send message to him for advice.

## 2018-05-06 NOTE — Telephone Encounter (Signed)
New Message   Patient wants to speak to Denise Pugh again about forms she was suppose to fill out for the patient.

## 2018-05-13 NOTE — Telephone Encounter (Signed)
If it is the thyroid that is making her feel poorly, It may take a few weeks for the dose change to lead to improvement. Does she have a follow up visit scheduled in next 3-4 weeks? MCr

## 2018-05-13 NOTE — Telephone Encounter (Signed)
Per dr c, pt needs to see APP sooner. Follow up scheduled

## 2018-05-13 NOTE — Telephone Encounter (Signed)
Spoke with pt, aware of dr croitoru's comments she has a follow up 07-27-2018.

## 2018-05-17 ENCOUNTER — Ambulatory Visit: Payer: Self-pay | Admitting: Physician Assistant

## 2018-05-17 NOTE — Progress Notes (Signed)
Remote pacemaker transmission.   

## 2018-05-27 ENCOUNTER — Other Ambulatory Visit: Payer: Self-pay | Admitting: Cardiovascular Disease

## 2018-05-30 ENCOUNTER — Telehealth: Payer: Self-pay | Admitting: Cardiovascular Disease

## 2018-05-30 NOTE — Telephone Encounter (Signed)
Pt wants to know if she should cancel her appt for Wednesday. She is worried with her afib she is at risk for catching the Covid-19. Please call and advise if cancellation is necessary or not

## 2018-05-30 NOTE — Telephone Encounter (Signed)
Returned call to patient.Stated she feels tired no energy.Advised to keep appointment as planned with Denise Sims DNP.Patient reassured.Advised to wash hands frequently and keep hands away from face.Advised when she checks in she can ask for a face mask.Stated she will keep appointment.

## 2018-05-31 ENCOUNTER — Other Ambulatory Visit: Payer: Self-pay

## 2018-05-31 MED ORDER — METOPROLOL TARTRATE 50 MG PO TABS: 75.0000 mg | ORAL_TABLET | Freq: Two times a day (BID) | ORAL | 3 refills | Status: DC

## 2018-05-31 NOTE — Progress Notes (Signed)
Cardiology Office Note   Date:  06/01/2018   ID:  Denise Pugh, DOB [birth year], MRN [id number]  PCP:  [name], MD  Cardiologist: [name]  Chief Complaint  Patient presents with  . Atrial Fibrillation  . Cardiomyopathy     History of Present Illness: Denise Pugh is a 85 y.o. female who presents for ongoing assessment and management of sinus node dysfunction s/p PPM, HTN, HL, OSA, with hypertrophic cardiomyopathy, with hx of PAF.   During last visit with Dr. Sallyanne Kuster for [facility] check, it was noted that she returned to atrial fibrillation, had a DCCV in January of 2020, and felt worse, with PPM interrogation revealing that she returned to atrial fib. On his examination she was found to be in atrial fib with RVR rate of 105 bpm. Because she is on amiodarone, TSH was checked. Dr.Croitoru recommended that she stop amiodarone and increased metoprolol dose to 75 mg BID. She was to continue anticoagulation with Eliquis-CHADS VASC Score of 4.   Pacemaker mode was also switched to VVIR in the setting of chronic atrial fibrillation. TSH was found to be 2.030. She was recommended to reduce levothyroxine to 75 mg. She is here for evaluation of HR and symptoms along with metoprolol titration.   She states she feels miserable. Feels much better when she is in NSR. She has not energy and is unable to be active. She has significant arthritis in her knees bilaterally and her right hip, requiring her to use a walker for ambulation. However, she states she is feeling more tired the longer she stays in A-fib. She wants to talk about ablation.   Past Medical History:  Diagnosis Date  . Arrhythmia    HX of SVT. CARDIONET MONITOR 07/15/12 TO 08/13/12  . Arthritis   . Family history of adverse reaction to anesthesia 1970s   mother  . GERD (gastroesophageal reflux disease)   . Hyperlipidemia 08/06/11   lexiscan myoview-normal. Due to severe attenuation the study specificity and sensitivty are deminished.  .  Hypertension 08/25/10   ECHO-EF 60-65%  . Hypothyroidism   . Irregular heart beat   . Shortness of breath   . Sleep apnea    SLEEP STUDY-[facility]  . Thyroid disease     Past Surgical History:  Procedure Laterality Date  . CARDIAC CATHETERIZATION  07/06/01   spade-like configuration  . CARDIOVERSION N/A 06/07/2017   Procedure: CARDIOVERSION;  Surgeon: [name], MD;  Location: [name];  Service: Cardiovascular;  Laterality: N/A;  . CARDIOVERSION N/A 04/14/2018   Procedure: CARDIOVERSION;  Surgeon: [name], MD;  Location: [facility];  Service: Cardiovascular;  Laterality: N/A;  . DILATION AND CURETTAGE OF UTERUS    . EYE SURGERY    . KNEE ARTHROSCOPY    . PERMANENT PACEMAKER INSERTION N/A 07/20/2013   Procedure: PERMANENT PACEMAKER INSERTION;  Surgeon: [name], MD;  Location: [facility];  Service: Cardiovascular;  Laterality: N/A;     Current Outpatient Medications  Medication Sig Dispense Refill  . acetaminophen (TYLENOL) 500 MG tablet Take 500 mg by mouth every 6 (six) hours as needed for moderate pain, fever or headache.     Marland Kitchen atorvastatin (LIPITOR) 20 MG tablet Take 0.5 tablets (10 mg total) by mouth daily. 45 tablet 3  . Cholecalciferol (VITAMIN D) 2000 UNITS tablet Take 2,000 Units by mouth daily.    Marland Kitchen dexlansoprazole (DEXILANT) 60 MG capsule Take 60 mg by mouth daily.    Marland Kitchen docusate  sodium (COLACE) 100 MG capsule Take 100 mg by mouth 2 (two) times daily.    Marland Kitchen ELIQUIS 5 MG TABS tablet TAKE 1 TABLET BY MOUTH TWO  TIMES DAILY 180 tablet 1  . escitalopram (LEXAPRO) 5 MG tablet Take 5 mg by mouth daily.    . hydrochlorothiazide (MICROZIDE) 12.5 MG capsule Take 1 capsule (12.5 mg total) by mouth daily. 90 capsule 3  . levothyroxine (SYNTHROID) 75 MCG tablet Take 1 tablet (75 mcg total) by mouth daily before breakfast. 90 tablet 3  . Liniments (DEEP BLUE RELIEF EX) Apply 1 application topically daily as needed (for back, shoulder and  knee pain).    . metoprolol tartrate (LOPRESSOR) 50 MG tablet Take 1.5 tablets (75 mg total) by mouth 2 (two) times daily. 270 tablet 3  . Multiple Vitamins-Minerals (PRESERVISION AREDS 2 PO) Take 2 tablets by mouth daily.    . mupirocin ointment (BACTROBAN) 2 % Place 1 application into the nose daily as needed (for dry nose).    Marland Kitchen OVER THE COUNTER MEDICATION Place 1 drop into both ears daily. Organic Ear Oil    . oxybutynin (DITROPAN) 5 MG tablet Take 5 mg by mouth 2 (two) times daily.     . sodium chloride (OCEAN) 0.65 % SOLN nasal spray Place 1 spray into both nostrils as needed for congestion.    . traMADol (ULTRAM) 50 MG tablet Take 100 mg by mouth 2 (two) times daily.      No current facility-administered medications for this visit.     Allergies:   Penicillins and Tetanus toxoid    Social History:  The patient  reports that she has never smoked. She has never used smokeless tobacco. She reports that she does not drink alcohol or use drugs.   Family History:  The patient's family history includes Diabetes in her father and mother; Heart attack in her maternal grandfather and maternal grandmother; Hypertension in her father.    ROS: All other systems are reviewed and negative. Unless otherwise mentioned in H&P    PHYSICAL EXAM: VS:  BP 138/72   Pulse 79   Ht 5\' 3"  (1.6 m)   Wt 170 lb 6.4 oz (77.3 kg)   BMI 30.19 kg/m  , BMI Body mass index is 30.19 kg/m. GEN: Well nourished, well developed, in no acute distress HEENT: normal Neck: no JVD, carotid bruits, or masses Cardiac: IRRR; no murmurs, rubs, or gallops,no edema  Respiratory:  Clear to auscultation bilaterally, normal work of breathing GI: soft, nontender, nondistended, + BS MS: no deformity or atrophy Skin: warm and dry, no rash Neuro:  Strength and sensation are intact Psych: euthymic mood, full affect   EKG:  Atrial fibrillation with frequent paced complexes. Incomplete RBBB Rate of 79 bpm.   Recent  Labs: 03/30/2018: BUN 13; Creatinine, Ser 0.98; Hemoglobin 14.2; Platelets 191; Potassium 3.8; Sodium 134 04/26/2018: TSH 2.030    Lipid Panel    Component Value Date/Time   CHOL 120 07/19/2013 0539   TRIG 52 07/19/2013 0539   HDL 45 07/19/2013 0539   CHOLHDL 2.7 07/19/2013 0539   VLDL 10 07/19/2013 0539   LDLCALC 65 07/19/2013 0539      Wt Readings from Last 3 Encounters:  06/01/18 170 lb 6.4 oz (77.3 kg)  04/26/18 169 lb 6.4 oz (76.8 kg)  03/30/18 170 lb (77.1 kg)      Other studies Reviewed: Echocardiogram 21-Jun-2017 Left ventricle: The cavity size was normal. There was mild   concentric hypertrophy. Systolic  function was normal. The   estimated ejection fraction was in the range of 60% to 65%. Wall   motion was normal; there were no regional wall motion   abnormalities. There was a reduced contribution of atrial   contraction to ventricular filling, due to increased ventricular   diastolic pressure or atrial contractile dysfunction. Doppler   parameters are consistent with a reversible restrictive pattern,   indicative of decreased left ventricular diastolic compliance   and/or increased left atrial pressure (grade 3 diastolic   dysfunction). Doppler parameters are consistent with high   ventricular filling pressure. - Aortic valve: Severely calcified annulus. Trileaflet; moderately   thickened, moderately calcified leaflets. There was moderate   regurgitation. - Mitral valve: Calcified annulus. There was mild to moderate   regurgitation. - Left atrium: The atrium was moderately dilated. - Tricuspid valve: There was mild regurgitation. - Pulmonary arteries: PA peak pressure: 38 mm Hg (S).  Impressions:  - The right ventricular systolic pressure was increased consistent   with mild pulmonary hypertension.  ASSESSMENT AND PLAN:  1. Atrial fibrillation; She was on amiodarone but due to elevated thyroid disease this was discontinued and metoprolol was increased to  50 mg 1.5 tablets BID. She has had DCCV on 04/14/2018 which was only temporarily successful. She feels much more tired on increased dose of metoprolol. She would like to talk to someone about ablation.   Due to her fatigue, I will not increase dose of metoprolol at this time. I will refer her to EP to discuss her candidacy for ablation, although with her age and co-morbidities and apical variant hypertrophic CM, she may not be favored to proceed with this. Defer EP recommendations. This makes her happy to at least discuss this further.   Continue Eliquis for anticoagulation. As she is unsteady on her feet and uses a walker, I have asked her to be more careful with ambulation. She does not express issues with bleeding.   2. Hypertension: BP is well controlled with higher dose of metoprolol. No changes at this time.   3. Hypothyroidism: Levothyroxine now at 75 mcg. She will follow up with her PCP for ongoing management.  4. PPM in situ: She is to continue interrogations per protocol,    Current medicines are reviewed at length with the patient today.    Labs/ tests ordered today include: None   Phill Myron. West Pugh, ANP, AACC   06/01/2018 1:32 PM    [address] Office [phone] Fax [phone]

## 2018-05-31 NOTE — Telephone Encounter (Signed)
Rx(s) sent to pharmacy electronically.  

## 2018-06-01 ENCOUNTER — Encounter: Payer: Self-pay | Admitting: Adult Health

## 2018-06-01 ENCOUNTER — Other Ambulatory Visit: Payer: Self-pay

## 2018-06-01 ENCOUNTER — Ambulatory Visit: Payer: Medicare Other | Admitting: Adult Health

## 2018-06-01 VITALS — BP 138/72 | HR 79 | Ht 63.0 in | Wt 170.4 lb

## 2018-06-01 DIAGNOSIS — I480 Paroxysmal atrial fibrillation: Secondary | ICD-10-CM | POA: Diagnosis not present

## 2018-06-01 DIAGNOSIS — I4811 Longstanding persistent atrial fibrillation: Secondary | ICD-10-CM | POA: Diagnosis not present

## 2018-06-01 DIAGNOSIS — Z950 Presence of cardiac pacemaker: Secondary | ICD-10-CM

## 2018-06-01 DIAGNOSIS — I422 Other hypertrophic cardiomyopathy: Secondary | ICD-10-CM | POA: Diagnosis not present

## 2018-06-01 NOTE — Patient Instructions (Signed)
Follow-Up: REFER TO EP FOR AFIB-SYMPTOMATIC.   KEEP SCHEDULED  APPOINTMENT WITH DR [id number].   FOLLOW UP WITH [name], MD AFTER. or one of the following Advanced Practice Providers on your designated Care Team:  Almyra Deforest, PA-C  Fabian Sharp, [place]      Medication Instructions:  NO CHANGES- Your physician recommends that you continue on your current medications as directed. Please refer to the Current Medication list given to you today. If you need a refill on your cardiac medications before your next appointment, please call your pharmacy. Labwork: When you have labs (blood work) and your tests are completely normal, you will receive your results ONLY by [place] (if you have MyChart) -OR- A paper copy in the mail.  At [facility], you and your health needs are our priority.  As part of our continuing mission to provide you with exceptional heart care, we have created designated Provider Care Teams.  These Care Teams include your primary Cardiologist (physician) and Advanced Practice Providers (APPs -  Physician Assistants and Nurse Practitioners) who all work together to provide you with the care you need, when you need it.  Thank you for choosing [facility] at [facility]!!

## 2018-06-08 ENCOUNTER — Institutional Professional Consult (permissible substitution): Payer: Medicare Other | Admitting: Internal Medicine

## 2018-06-15 ENCOUNTER — Telehealth: Payer: Self-pay | Admitting: Cardiovascular Disease

## 2018-06-15 NOTE — Telephone Encounter (Signed)
Pt aware office will call her tomorrow to do some screening questions, then I will call her to let her know about Friday. She understands she will not be coming into the office Friday d/t pandemic.

## 2018-06-15 NOTE — Telephone Encounter (Signed)
° °  Patient calling to verify if she needs to keep her appointment with Dr Curt Bears on Friday 3/27. Please advise

## 2018-06-17 ENCOUNTER — Telehealth: Payer: Medicare Other | Admitting: Cardiology

## 2018-06-17 ENCOUNTER — Other Ambulatory Visit: Payer: Self-pay

## 2018-07-14 ENCOUNTER — Other Ambulatory Visit: Payer: Self-pay | Admitting: Cardiovascular Disease

## 2018-07-19 ENCOUNTER — Telehealth: Payer: Self-pay | Admitting: Cardiovascular Disease

## 2018-07-19 NOTE — Telephone Encounter (Signed)
LVM for patient to call with preference on video, phone or office visit.

## 2018-07-25 DIAGNOSIS — Z0000 Encounter for general adult medical examination without abnormal findings: Secondary | ICD-10-CM | POA: Diagnosis not present

## 2018-07-26 ENCOUNTER — Telehealth: Payer: Self-pay | Admitting: Cardiovascular Disease

## 2018-07-27 ENCOUNTER — Telehealth: Payer: Self-pay

## 2018-07-27 ENCOUNTER — Telehealth (INDEPENDENT_AMBULATORY_CARE_PROVIDER_SITE_OTHER): Payer: Medicare Other | Admitting: Cardiovascular Disease

## 2018-07-27 ENCOUNTER — Encounter: Payer: Self-pay | Admitting: Cardiovascular Disease

## 2018-07-27 DIAGNOSIS — I422 Other hypertrophic cardiomyopathy: Secondary | ICD-10-CM | POA: Diagnosis not present

## 2018-07-27 DIAGNOSIS — I10 Essential (primary) hypertension: Secondary | ICD-10-CM | POA: Diagnosis not present

## 2018-07-27 DIAGNOSIS — E785 Hyperlipidemia, unspecified: Secondary | ICD-10-CM | POA: Diagnosis not present

## 2018-07-27 DIAGNOSIS — Z950 Presence of cardiac pacemaker: Secondary | ICD-10-CM

## 2018-07-27 DIAGNOSIS — Z7901 Long term (current) use of anticoagulants: Secondary | ICD-10-CM | POA: Diagnosis not present

## 2018-07-27 DIAGNOSIS — I4891 Unspecified atrial fibrillation: Secondary | ICD-10-CM | POA: Diagnosis not present

## 2018-07-27 DIAGNOSIS — I495 Sick sinus syndrome: Secondary | ICD-10-CM

## 2018-07-27 NOTE — Telephone Encounter (Signed)
LMTCB to review AVS from virtual visit with Dr. Sallyanne Kuster  Letter including After Visit Summary and any other necessary documents to be mailed to the patient's address on file.

## 2018-07-27 NOTE — Progress Notes (Signed)
Virtual Visit via Video Note   This visit type was conducted due to national recommendations for restrictions regarding the COVID-19 Pandemic (e.g. social distancing) in an effort to limit this patient's exposure and mitigate transmission in our community.  Due to her co-morbid illnesses, this patient is at least at moderate risk for complications without adequate follow up.  This format is felt to be most appropriate for this patient at this time.  All issues noted in this document were discussed and addressed.  A limited physical exam was performed with this format.  Please refer to the patient's chart for her consent to telehealth for [facility].   We encountered a lot of difficulty with the video visit.  Eventually we switched to a following visit to decrease the patient's frustration.  Date:  07/27/2018   ID:  Denise Pugh, DOB [birth year], MRN [id number]  Patient Location: Home Provider Location: Home  PCP:  [name], MD  Cardiologist:  [name], MD  Electrophysiologist:  None   Evaluation Performed:  Follow-Up Visit  Chief Complaint: Pacemaker follow-up  History of Present Illness:    Denise Pugh is a 85 y.o. female with a history of tachycardia-bradycardia syndrome for which she received a dual-chamber permanent pacemaker which is now programmed VVIR due to persistent atrial fibrillation with early recurrence after cardioversion..  Additional medical problems include systemic hypertension, hyperlipidemia, obstructive sleep apnea and apical variant hypertrophic cardiomyopathy (which has not been associated with heart failure or ventricular arrhythmia).  After we attempted cardioversion in January she maintained sinus rhythm for very brief period of time and did not really reap symptomatic benefit.  She is now occasionally aware that her heartbeat is faster and irregular and then returns to a regular slow baseline.  She denies angina, change in exertional dyspnea,  dizziness or syncope, leg edema or other cardiovascular complaints.  She did have an episode last week that sounds like near syncope due to orthostatic hypotension.  She was standing up and suddenly felt rather flushed and weak.  Felt a little better when she sat down.  The symptoms gradually resolved over period of 5 to 10 minutes.  She was not aware of palpitations at the time and did not experience full-blown syncope.  She drinks sweet tea most of the day.  She also does not have a meal at lunchtime.  She tells me that she does not drink any water between breakfast and when she takes her pills in the evening.  She is on chronic diuretic therapy for hypertension.  She does not feel as well as she did in the past, when she was in normal rhythm, but symptoms are generally tolerable.  She is on anticoagulation with Eliquis and has not had any falls, injuries or bleeding complaints.  Her most recent pacemaker interrogation shows that she is not device dependent.  In fact she only paces 39% of the time.  Rate control appears appropriate and the heart rate histogram distribution is normal.    The patient does not have symptoms concerning for COVID-19 infection (fever, chills, cough, or new shortness of breath).    Past Medical History:  Diagnosis Date  . Arrhythmia    HX of SVT. CARDIONET MONITOR 07/15/12 TO 08/13/12  . Arthritis   . Family history of adverse reaction to anesthesia 1970s   mother  . GERD (gastroesophageal reflux disease)   . Hyperlipidemia 08/06/11   lexiscan myoview-normal. Due to severe attenuation the study specificity and sensitivty  are deminished.  . Hypertension 08/25/10   ECHO-EF 60-65%  . Hypothyroidism   . Irregular heart beat   . Shortness of breath   . Sleep apnea    SLEEP STUDY-[facility]  . Thyroid disease    Past Surgical History:  Procedure Laterality Date  . CARDIAC CATHETERIZATION  07/06/01   spade-like configuration  .  CARDIOVERSION N/A 06/07/2017   Procedure: CARDIOVERSION;  Surgeon: [name], MD;  Location: [facility];  Service: Cardiovascular;  Laterality: N/A;  . CARDIOVERSION N/A 04/14/2018   Procedure: CARDIOVERSION;  Surgeon: [name], MD;  Location: [facility];  Service: Cardiovascular;  Laterality: N/A;  . DILATION AND CURETTAGE OF UTERUS    . EYE SURGERY    . KNEE ARTHROSCOPY    . PERMANENT PACEMAKER INSERTION N/A 07/20/2013   Procedure: PERMANENT PACEMAKER INSERTION;  Surgeon: [name], MD;  Location: [facility]ATH LAB;  Service: Cardiovascular;  Laterality: N/A;     Current Meds  Medication Sig  . acetaminophen (TYLENOL) 500 MG tablet Take 500 mg by mouth every 6 (six) hours as needed for moderate pain, fever or headache.   Marland Kitchen atorvastatin (LIPITOR) 20 MG tablet TAKE ONE-HALF TABLET BY  MOUTH DAILY  . Cholecalciferol (VITAMIN D) 2000 UNITS tablet Take 2,000 Units by mouth daily.  Marland Kitchen dexlansoprazole (DEXILANT) 60 MG capsule Take 60 mg by mouth daily.  Marland Kitchen docusate sodium (COLACE) 100 MG capsule Take 100 mg by mouth 2 (two) times daily.  Marland Kitchen ELIQUIS 5 MG TABS tablet TAKE 1 TABLET BY MOUTH TWO  TIMES DAILY  . escitalopram (LEXAPRO) 5 MG tablet Take 5 mg by mouth daily.  . hydrochlorothiazide (MICROZIDE) 12.5 MG capsule TAKE 1 CAPSULE BY MOUTH  DAILY  . levothyroxine (SYNTHROID) 75 MCG tablet Take 1 tablet (75 mcg total) by mouth daily before breakfast.  . Liniments (DEEP BLUE RELIEF EX) Apply 1 application topically daily as needed (for back, shoulder and knee pain).  . metoprolol tartrate (LOPRESSOR) 50 MG tablet Take 1.5 tablets (75 mg total) by mouth 2 (two) times daily.  . Multiple Vitamins-Minerals (PRESERVISION AREDS 2 PO) Take 2 tablets by mouth daily.  . mupirocin ointment (BACTROBAN) 2 % Place 1 application into the nose daily as needed (for dry nose).  Marland Kitchen OVER THE COUNTER MEDICATION Place 1 drop into both ears daily. Organic Ear Oil  . oxybutynin (DITROPAN) 5 MG tablet Take 5  mg by mouth 2 (two) times daily.   . sodium chloride (OCEAN) 0.65 % SOLN nasal spray Place 1 spray into both nostrils as needed for congestion.  . traMADol (ULTRAM) 50 MG tablet Take 100 mg by mouth 2 (two) times daily.      Allergies:   Penicillins and Tetanus toxoid   Social History   Tobacco Use  . Smoking status: Never Smoker  . Smokeless tobacco: Never Used  Substance Use Topics  . Alcohol use: No    Alcohol/week: 0.0 standard drinks  . Drug use: No     Family Hx: The patient's family history includes Diabetes in her father and mother; Heart attack in her maternal grandfather and maternal grandmother; Hypertension in her father.  ROS:   Please see the history of present illness.     All other systems reviewed and are negative.   Prior CV studies:   The following studies were reviewed today:  Notes from K. Purcell Nails, NP on March 11 The latest pacemaker download on February 12  Labs/Other Tests and Data Reviewed:  EKG:  An ECG dated 06/01/2018 was personally reviewed today and demonstrated:  Atrial fibrillation with controlled ventricular rate and rare ventricular pacing, incomplete right bundle branch block, nonspecific repolarization changes  Recent Labs: 03/30/2018: BUN 13; Creatinine, Ser 0.98; Hemoglobin 14.2; Platelets 191; Potassium 3.8; Sodium 134 04/26/2018: TSH 2.030   Recent Lipid Panel Lab Results  Component Value Date/Time   CHOL 120 07/19/2013 05:39 AM   TRIG 52 07/19/2013 05:39 AM   HDL 45 07/19/2013 05:39 AM   CHOLHDL 2.7 07/19/2013 05:39 AM   LDLCALC 65 07/19/2013 05:39 AM    Wt Readings from Last 3 Encounters:  07/27/18 165 lb (74.8 kg)  06/01/18 170 lb 6.4 oz (77.3 kg)  04/26/18 169 lb 6.4 oz (76.8 kg)     Objective:    Vital Signs:  Pulse 68   Ht 5\' 3"  (1.6 m)   Wt 165 lb (74.8 kg)   BMI 29.23 kg/m    VITAL SIGNS:  reviewed Exam was not possible  ASSESSMENT & PLAN:    1. AFib: Appears well rate controlled on the current dose of  beta-blocker and she is appropriately anticoagulated.  I am afraid this will be a permanent arrhythmia for her.  In the past she has brought up the issue of ablation, but I doubt this will be of clinical benefit in an 85 year old.  Since amiodarone was discontinued a few months ago, it still possible we may have to increase her dose of beta-blocker in the near future. 2. PPM: Normal device function.  Continue downloads every 3 months and yearly office visits, but she requested that her next visit be in person. 3. Apical HCM: This may be associated with diastolic dysfunction and explained why she does not tolerate atrial fibrillation well.  Unfortunately there are no good antiarrhythmic options for her.  She has never had evidence of LV outflow tract obstruction by physical exam or by echocardiography. 4. Anticoagulation: Well-tolerated without falls or bleeding complications 5. HTN: She was unable to check her blood pressure today.  But it sounds to me that she may be of having occasional orthostatic hypotension.  Advised that she increase her intake of water, decrease her intake of tea and avoid making sudden position changes.  Her recent episode of flushing and dizziness sounds much more compatible with low blood pressure than arrhythmia, but I promised I will check her upcoming pacemaker download to see if there was any evidence of a serious arrhythmia around the time of her symptoms.  COVID-19 Education: The signs and symptoms of COVID-19 were discussed with the patient and how to seek care for testing (follow up with PCP or arrange E-visit).  The importance of social distancing was discussed today.  Time:   Today, I have spent 21 minutes with the patient with telehealth technology discussing the above problems.     Medication Adjustments/Labs and Tests Ordered: Current medicines are reviewed at length with the patient today.  Concerns regarding medicines are outlined above.   Tests Ordered: No  orders of the defined types were placed in this encounter.   Medication Changes: No orders of the defined types were placed in this encounter.   Disposition:  Follow up 3 months  Signed, [name], MD  07/27/2018 12:14 PM    [facility]

## 2018-07-27 NOTE — Patient Instructions (Addendum)
Medication Instructions:  Your physician recommends that you continue on your current medications as directed. Please refer to the Current Medication list given to you today.  If you need a refill on your cardiac medications before your next appointment, please call your pharmacy.   Lab work: NONE If you have labs (blood work) drawn today and your tests are completely normal, you will receive your results only by: Marland Kitchen MyChart Message (if you have MyChart) OR . A paper copy in the mail If you have any lab test that is abnormal or we need to change your treatment, we will call you to review the results.  Testing/Procedures: NONE  Follow-Up: At [facility], you and your health needs are our priority.  As part of our continuing mission to provide you with exceptional heart care, we have created designated Provider Care Teams.  These Care Teams include your primary Cardiologist (physician) and Advanced Practice Providers (APPs -  Physician Assistants and Nurse Practitioners) who all work together to provide you with the care you need, when you need it. You will need an appointment for pacemaker check in the office in 3 months (AUGUST 2020) with Dr. Sallyanne Kuster. You will be contacted by a scheduler to set up this appointment.

## 2018-08-03 ENCOUNTER — Other Ambulatory Visit: Payer: Self-pay

## 2018-08-03 ENCOUNTER — Ambulatory Visit (INDEPENDENT_AMBULATORY_CARE_PROVIDER_SITE_OTHER): Payer: Medicare Other | Admitting: *Deleted

## 2018-08-03 DIAGNOSIS — I495 Sick sinus syndrome: Secondary | ICD-10-CM

## 2018-08-03 DIAGNOSIS — I4891 Unspecified atrial fibrillation: Secondary | ICD-10-CM

## 2018-08-04 LAB — CUP PACEART REMOTE DEVICE CHECK
Battery Remaining Longevity: 39 mo
Battery Voltage: 2.98 V
Brady Statistic AP VP Percent: 2.38 %
Brady Statistic AP VS Percent: 0.38 %
Brady Statistic AS VP Percent: 35.24 %
Brady Statistic AS VS Percent: 62 %
Brady Statistic RA Percent Paced: 2.22 %
Brady Statistic RV Percent Paced: 36 %
Date Time Interrogation Session: 20200513204838
Implantable Lead Implant Date: 20150430
Implantable Lead Implant Date: 20150430
Implantable Lead Location: 753859
Implantable Lead Location: 753860
Implantable Lead Model: 5076
Implantable Lead Model: 5076
Implantable Pulse Generator Implant Date: 20150430
Lead Channel Impedance Value: 361 Ohm
Lead Channel Impedance Value: 437 Ohm
Lead Channel Impedance Value: 513 Ohm
Lead Channel Impedance Value: 570 Ohm
Lead Channel Pacing Threshold Amplitude: 0.75 V
Lead Channel Pacing Threshold Amplitude: 1.125 V
Lead Channel Pacing Threshold Pulse Width: 0.4 ms
Lead Channel Pacing Threshold Pulse Width: 0.4 ms
Lead Channel Sensing Intrinsic Amplitude: 0.625 mV
Lead Channel Sensing Intrinsic Amplitude: 26.75 mV
Lead Channel Setting Pacing Amplitude: 2.25 V
Lead Channel Setting Pacing Amplitude: 2.5 V
Lead Channel Setting Pacing Pulse Width: 0.4 ms
Lead Channel Setting Sensing Sensitivity: 4 mV

## 2018-08-23 NOTE — Progress Notes (Signed)
Remote pacemaker transmission.   

## 2018-09-09 DIAGNOSIS — Z950 Presence of cardiac pacemaker: Secondary | ICD-10-CM | POA: Diagnosis not present

## 2018-09-09 DIAGNOSIS — M1990 Unspecified osteoarthritis, unspecified site: Secondary | ICD-10-CM | POA: Diagnosis not present

## 2018-09-09 DIAGNOSIS — I482 Chronic atrial fibrillation, unspecified: Secondary | ICD-10-CM | POA: Diagnosis not present

## 2018-09-15 ENCOUNTER — Telehealth: Payer: Self-pay | Admitting: Cardiovascular Disease

## 2018-09-15 NOTE — Telephone Encounter (Signed)
New Message    Patient c/o Palpitations:  High priority if patient c/o lightheadedness, shortness of breath, or chest pain  1) How long have you had palpitations/irregular HR/ Afib? Are you having the symptoms now? For a long time   2) Are you currently experiencing lightheadedness, SOB or CP? Lightheadness  3) Do you have a history of afib (atrial fibrillation) or irregular heart rhythm? Yes   4) Have you checked your BP or HR? (document readings if available): Haven't checked   5) Are you experiencing any other symptoms? NO

## 2018-09-15 NOTE — Telephone Encounter (Signed)
Pt has history of tachycardia-bradycardia syndrome and A Fib and c/o lightheadedness and tiredness. Pt states she is more concerned about tiredness and became tearful over the phone stating she cannot cope with it any more and was supposed to be referred to A Fib clinic, but no one has contacted her. It appears that she couldn't present for her 3/18 appt with Dr. Rayann Heman and that she did not want to do virtual appt with Dr. Curt Bears on 3/27 d/t preference for in-office visit. Pt set up for an appt with Almyra Deforest, PA-C for 7/2. Staff message sent to Lorenda Hatchet for f/u on setting up those appts again in their office. If she has appt(s) set up before 7/2 then her appt with the APP can possibly be cancelled. Pt aware that message sent for f/u on EP referral and request for A Fib clinic and pt agreeable with keeping 7/2 appt with Almyra Deforest, PA-C

## 2018-09-16 NOTE — Telephone Encounter (Signed)
Noted. Thanks.

## 2018-09-19 NOTE — Telephone Encounter (Signed)
Opened in error

## 2018-09-20 NOTE — Telephone Encounter (Signed)
Follow Up  Patient calling back in to follow up about her appointment that is scheduled on 09/22/18, states that she is aware of the appointment, but wanted to talk with the nurse that she previously spoke with to get a better understanding of the appointment. Please give patient a call back.

## 2018-09-22 ENCOUNTER — Ambulatory Visit: Payer: Medicare Other | Admitting: Physician Assistant

## 2018-09-27 ENCOUNTER — Telehealth: Payer: Self-pay

## 2018-09-27 NOTE — Telephone Encounter (Signed)
Spoke with pt regarding appt on 09/28/18. Pt was advise to check her vitals prior to appt. Pt questions and concerns were address.

## 2018-09-28 ENCOUNTER — Other Ambulatory Visit: Payer: Self-pay

## 2018-09-28 ENCOUNTER — Encounter: Payer: Self-pay | Admitting: Internal Medicine

## 2018-09-28 ENCOUNTER — Telehealth (INDEPENDENT_AMBULATORY_CARE_PROVIDER_SITE_OTHER): Payer: Medicare Other | Admitting: Internal Medicine

## 2018-09-28 VITALS — BP 130/82 | HR 74 | Ht 63.0 in | Wt 165.0 lb

## 2018-09-28 DIAGNOSIS — I10 Essential (primary) hypertension: Secondary | ICD-10-CM

## 2018-09-28 DIAGNOSIS — I495 Sick sinus syndrome: Secondary | ICD-10-CM | POA: Diagnosis not present

## 2018-09-28 DIAGNOSIS — I4821 Permanent atrial fibrillation: Secondary | ICD-10-CM | POA: Diagnosis not present

## 2018-09-28 NOTE — Progress Notes (Signed)
Electrophysiology TeleHealth Note   Due to national recommendations of social distancing due to [place] 19, Audio/video telehealth visit is felt to be most appropriate for this patient at this time.  See MyChart message from today for patient consent regarding telehealth for [facility].   Date:  09/28/2018   ID:  Denise Pugh, DOB Aug 09, 1928, MRN [id number]  Location: home  Provider location: [address] [place] Evaluation Performed: New patient consult  PCP:  [name], MD  Cardiologist:  [name], MD / [place] Electrophysiologist:  None   Chief Complaint:  AF management  History of Present Illness:    Denise Pugh is a 85 y.o. female who presents via audio/video conferencing for a telehealth visit today.   The patient is referred for new consultation regarding AF by Bunnie Domino, NP.    She has been seen recently by Dr C and has a pacemaker implanted for tachy brady syndrome. She also has likely permanent atrial fibrillation, referred today for options of AF management.   Today, she denies symptoms of palpitations, chest pain, shortness of breath, orthopnea, PND, lower extremity edema, claudication, dizziness, presyncope, syncope, bleeding, or neurologic sequela. She has chronic fatigue. The patient is tolerating medications without difficulties and is otherwise without complaint today.   she denies symptoms of cough, fevers, chills, or new SOB worrisome for COVID 19.   Past Medical History:  Diagnosis Date  . Arrhythmia    HX of SVT. CARDIONET MONITOR 07/15/12 TO 08/13/12  . Arthritis   . Family history of adverse reaction to anesthesia 1970s   mother  . GERD (gastroesophageal reflux disease)   . Hyperlipidemia 08/06/11   lexiscan myoview-normal. Due to severe attenuation the study specificity and sensitivty are deminished.  . Hypertension 08/25/10   ECHO-EF 60-65%  . Hypothyroidism   . Irregular heart beat   . Shortness of breath   .  Sleep apnea    SLEEP STUDY-[facility]  . Thyroid disease     Past Surgical History:  Procedure Laterality Date  . CARDIAC CATHETERIZATION  07/06/01   spade-like configuration  . CARDIOVERSION N/A 06/07/2017   Procedure: CARDIOVERSION;  Surgeon: [name], MD;  Location: [facility];  Service: Cardiovascular;  Laterality: N/A;  . CARDIOVERSION N/A 04/14/2018   Procedure: CARDIOVERSION;  Surgeon: [name], MD;  Location: [facility];  Service: Cardiovascular;  Laterality: N/A;  . DILATION AND CURETTAGE OF UTERUS    . EYE SURGERY    . KNEE ARTHROSCOPY    . PERMANENT PACEMAKER INSERTION N/A 07/20/2013   Procedure: PERMANENT PACEMAKER INSERTION;  Surgeon: [name], MD;  Location: [facility];  Service: Cardiovascular;  Laterality: N/A;    Current Outpatient Medications  Medication Sig Dispense Refill  . acetaminophen (TYLENOL) 500 MG tablet Take 500 mg by mouth every 6 (six) hours as needed for moderate pain, fever or headache.     Marland Kitchen atorvastatin (LIPITOR) 20 MG tablet TAKE ONE-HALF TABLET BY  MOUTH DAILY 45 tablet 2  . Cholecalciferol (VITAMIN D) 2000 UNITS tablet Take 2,000 Units by mouth daily.    Marland Kitchen dexlansoprazole (DEXILANT) 60 MG capsule Take 60 mg by mouth daily.    Marland Kitchen docusate sodium (COLACE) 100 MG capsule Take 100 mg by mouth 2 (two) times daily.    Marland Kitchen ELIQUIS 5 MG TABS tablet TAKE 1 TABLET BY MOUTH TWO  TIMES DAILY 180 tablet 1  . escitalopram (LEXAPRO) 5 MG tablet Take 5 mg  by mouth daily.    . hydrochlorothiazide (MICROZIDE) 12.5 MG capsule TAKE 1 CAPSULE BY MOUTH  DAILY 90 capsule 2  . levothyroxine (SYNTHROID) 75 MCG tablet Take 1 tablet (75 mcg total) by mouth daily before breakfast. 90 tablet 3  . Liniments (DEEP BLUE RELIEF EX) Apply 1 application topically daily as needed (for back, shoulder and knee pain).    . metoprolol tartrate (LOPRESSOR) 50 MG tablet Take 1.5 tablets (75 mg total) by mouth 2 (two) times daily. 270 tablet 3  .  Multiple Vitamins-Minerals (PRESERVISION AREDS 2 PO) Take 2 tablets by mouth daily.    . mupirocin ointment (BACTROBAN) 2 % Place 1 application into the nose daily as needed (for dry nose).    Marland Kitchen OVER THE COUNTER MEDICATION Place 1 drop into both ears daily. Organic Ear Oil    . oxybutynin (DITROPAN) 5 MG tablet Take 5 mg by mouth 2 (two) times daily.     . sodium chloride (OCEAN) 0.65 % SOLN nasal spray Place 1 spray into both nostrils as needed for congestion.    . traMADol (ULTRAM) 50 MG tablet Take 100 mg by mouth 2 (two) times daily.      No current facility-administered medications for this visit.     Allergies:   Penicillins and Tetanus toxoid   Social History:  The patient  reports that she has never smoked. She has never used smokeless tobacco. She reports that she does not drink alcohol or use drugs.   Family History:  The patient's family history includes Diabetes in her father and mother; Heart attack in her maternal grandfather and maternal grandmother; Hypertension in her father.    ROS:  Please see the history of present illness.   All other systems are personally reviewed and negative.    Exam:    Vital Signs:  BP 130/82   Pulse 74   Ht 5\' 3"  (1.6 m)   Wt 165 lb (74.8 kg)   BMI 29.23 kg/m    Well appearing, alert and conversant, regular work of breathing,  good skin color Eyes- anicteric, neuro- grossly intact, skin- no apparent rash or lesions or cyanosis, mouth- oral mucosa is pink   Labs/Other Tests and Data Reviewed:    Recent Labs: 03/30/2018: BUN 13; Creatinine, Ser 0.98; Hemoglobin 14.2; Platelets 191; Potassium 3.8; Sodium 134 04/26/2018: TSH 2.030   Wt Readings from Last 3 Encounters:  09/28/18 165 lb (74.8 kg)  07/27/18 165 lb (74.8 kg)  06/01/18 170 lb 6.4 oz (77.3 kg)     Other studies personally reviewed: Additional studies/ records that were reviewed today include: Dr Lurline Del office notes  Most recent remote interrogation reviewed from 07/2018 with  persistent AF since 11/2017  ASSESSMENT & PLAN:    1.  Permanent atrial fibrillation Agree with Dr C that she is not an ablation candidate V rates controlled by recent remote Continue [place] for [place] of 4  2.  Tachy brady syndrome Normal pacemaker function by recent remotes Follow up with Dr C as scheduled  3.  HTN Stable No change required today    COVID screen The patient does not have any symptoms that suggest any further testing/ screening at this time.  Social distancing reinforced today.  Follow-up:  Dr C as scheduled   Current medicines are reviewed at length with the patient today.   The patient does not have concerns regarding her medicines.  The following changes were made today:  none  Labs/ tests ordered today include:  none No orders of the defined types were placed in this encounter.   Patient Risk:  after full review of this patients clinical status, I feel that they are at moderate risk at this time.   Today, I have spent 25 minutes with the patient with telehealth technology discussing AF .    SignedThompson Grayer MD, [facility] 09/28/2018 4:04 PM   [place] [address] [phone] (office) [phone] (fax)

## 2018-09-29 ENCOUNTER — Other Ambulatory Visit: Payer: Self-pay | Admitting: Cardiovascular Disease

## 2018-09-29 NOTE — Telephone Encounter (Signed)
Per chart review, pt had an appointment yesterday 7/8 with Dr. Rayann Heman and recommend f/u with Dr. Loletha Grayer as planned.

## 2018-09-30 NOTE — Telephone Encounter (Signed)
Pt is a 85 yr old female who had a telemed appt on 09/28/18 with Dr. Rayann Heman. Her last noted weight was 77.3Kg on 06/01/18, SCr on 03/30/18 was 0.98. Will refill Eliquis 5mg  BID.

## 2018-10-06 ENCOUNTER — Other Ambulatory Visit: Payer: Self-pay | Admitting: Cardiovascular Disease

## 2018-10-25 ENCOUNTER — Telehealth: Payer: Self-pay | Admitting: Cardiovascular Disease

## 2018-10-25 NOTE — Telephone Encounter (Signed)
Returned call to patient of Dr. Sallyanne Kuster. She has an in-office appt on 11/14/18. She reports she would be high-risk for ablation per Dr. Rayann Heman but she reports she feels awful, fatigued with minimal exertion. She would like something to be prescribed for her to have "pep in her brain" - seems depressed and down. She does not report any other cardiac concerns. She is tearful on the phone. She reports she has told her PCP how she is feeling and he adjusted her thyroid medication thinking it would give her more energy but did not directly address her concerns.   She would like Dr. Lurline Del opinion on her situation. Message routed to MD/RN

## 2018-10-25 NOTE — Telephone Encounter (Signed)
New Message   Patient is calling to discuss her upcoming appt. She states that she lives alone and very anxious. But she could not really advise me as to if she is having an issue. Please call to discuss.

## 2018-10-26 NOTE — Telephone Encounter (Addendum)
Spoke with patient who called in to report she was taking LEXAPRO 10mg  prior to MD dose increase suggestion and she just realized that. She would like to know what dose to increase this medication to.   Routed to Dr. Loletha Grayer

## 2018-10-26 NOTE — Telephone Encounter (Signed)
I wish I had a "pep in my brain" pill.  She does have several complaints that could represent depression, in addition to her other health problems. She is on a very low dose of lexapro. I think increasing that to 10 mg daily could help and I suspect it would be safe. I copied Dr. [name] on this message.

## 2018-10-26 NOTE — Telephone Encounter (Signed)
Patient called w/MD advice and voiced understanding. Med list updated. She was advised to contact PCP for additional follow up

## 2018-10-27 NOTE — Telephone Encounter (Signed)
OK to increase to 20 mg daily

## 2018-10-27 NOTE — Telephone Encounter (Signed)
The patient has verbalized her understanding to take the Lexapro 20 mg once daily. She stated that she has left a message with her PCP for a refill and will call them back again tomorrow.

## 2018-10-27 NOTE — Telephone Encounter (Signed)
Follow up ° ° °Patient is returning your call. Please call. ° ° ° °

## 2018-10-27 NOTE — Telephone Encounter (Signed)
Left a message for the patient to call back.  

## 2018-11-03 ENCOUNTER — Ambulatory Visit (INDEPENDENT_AMBULATORY_CARE_PROVIDER_SITE_OTHER): Payer: Medicare Other | Admitting: *Deleted

## 2018-11-03 DIAGNOSIS — I495 Sick sinus syndrome: Secondary | ICD-10-CM

## 2018-11-03 LAB — CUP PACEART REMOTE DEVICE CHECK
Battery Remaining Longevity: 35 mo
Battery Voltage: 2.97 V
Brady Statistic AP VP Percent: 1.21 %
Brady Statistic AP VS Percent: 0.25 %
Brady Statistic AS VP Percent: 27.56 %
Brady Statistic AS VS Percent: 70.99 %
Brady Statistic RA Percent Paced: 1.05 %
Brady Statistic RV Percent Paced: 25.89 %
Date Time Interrogation Session: 20200813101357
Implantable Lead Implant Date: 20150430
Implantable Lead Implant Date: 20150430
Implantable Lead Location: 753859
Implantable Lead Location: 753860
Implantable Lead Model: 5076
Implantable Lead Model: 5076
Implantable Pulse Generator Implant Date: 20150430
Lead Channel Impedance Value: 361 Ohm
Lead Channel Impedance Value: 437 Ohm
Lead Channel Impedance Value: 494 Ohm
Lead Channel Impedance Value: 532 Ohm
Lead Channel Sensing Intrinsic Amplitude: 0.875 mV
Lead Channel Sensing Intrinsic Amplitude: 25.625 mV
Lead Channel Setting Pacing Amplitude: 2.25 V
Lead Channel Setting Pacing Amplitude: 2.5 V
Lead Channel Setting Pacing Pulse Width: 0.4 ms
Lead Channel Setting Sensing Sensitivity: 4 mV

## 2018-11-11 ENCOUNTER — Telehealth: Payer: Self-pay | Admitting: *Deleted

## 2018-11-11 NOTE — Telephone Encounter (Signed)
Virtual Visit Pre-Appointment Phone Call  "(Name), I am calling you today to discuss your upcoming appointment. We are currently trying to limit exposure to the virus that causes COVID-19 by seeing patients at home rather than in the office."  1. "What is the BEST phone number to call the day of the visit?" - include this in appointment notes  2. "Do you have or have access to (through a family member/friend) a smartphone with video capability that we can use for your visit?" a. If yes - list this number in appt notes as "cell" (if different from BEST phone #) and list the appointment type as a VIDEO visit in appointment notes b. If no - list the appointment type as a PHONE visit in appointment notes  Confirm consent - "In the setting of the current Covid19 crisis, you are scheduled for a (phone or video) visit with your provider on (date) at (time).  Just as we do with many in-office visits, in order for you to participate in this visit, we must obtain consent.  If you'd like, I can send this to your mychart (if signed up) or email for you to review.  Otherwise, I can obtain your verbal consent now.  All virtual visits are billed to your insurance company just like a normal visit would be.  By agreeing to a virtual visit, we'd like you to understand that the technology does not allow for your provider to perform an examination, and thus may limit your provider's ability to fully assess your condition. If your provider identifies any concerns that need to be evaluated in person, we will make arrangements to do so.  Finally, though the technology is pretty good, we cannot assure that it will always work on either your or our end, and in the setting of a video visit, we may have to convert it to a phone-only visit.  In either situation, we cannot ensure that we have a secure connection.  Are you willing to proceed?" Yes  3. Advise patient to be prepared - "Two hours prior to your appointment, go  ahead and check your blood pressure, pulse, oxygen saturation, and your weight (if you have the equipment to check those) and write them all down. When your visit starts, your provider will ask you for this information. If you have an Apple Watch or Kardia device, please plan to have heart rate information ready on the day of your appointment. Please have a pen and paper handy nearby the day of the visit as well."  4. Give patient instructions for MyChart download to smartphone OR Doximity/Doxy.me as below if video visit (depending on what platform provider is using)  5. Inform patient they will receive a phone call 15 minutes prior to their appointment time (may be from unknown caller ID) so they should be prepared to answer    Denise Pugh has been deemed a candidate for a follow-up tele-health visit to limit community exposure during the Covid-19 pandemic. I spoke with the patient via phone to ensure availability of phone/video source, confirm preferred email & phone number, and discuss instructions and expectations.  I reminded Denise Pugh to be prepared with any vital sign and/or heart rhythm information that could potentially be obtained via home monitoring, at the time of her visit. I reminded Denise Pugh to expect a phone call prior to her visit.  Ricci Barker, RN 11/11/2018 4:41 PM   INSTRUCTIONS FOR  DOWNLOADING THE MYCHART APP TO SMARTPHONE  - The patient must first make sure to have activated MyChart and know their login information - If Apple, go to [employer] and type in MyChart in the search bar and download the app. If Android, ask patient to go to [employer] and type in [place] in the search bar and download the app. The app is free but as with any other app downloads, their phone may require them to verify saved payment information or Apple/Android password.  - The patient will need to then log into the app with their MyChart username and  password, and select [facility] as their healthcare provider to link the account. When it is time for your visit, go to the MyChart app, find appointments, and click Begin Video Visit. Be sure to Select Allow for your device to access the Microphone and Camera for your visit. You will then be connected, and your provider will be with you shortly.  **If they have any issues connecting, or need assistance please contact MyChart service desk [phone])**  **If using a computer, in order to ensure the best quality for their visit they will need to use either of the following Internet Browsers: [employer], or Google Chrome**  IF USING DOXIMITY or DOXY.ME - The patient will receive a link just prior to their visit by text.     FULL LENGTH CONSENT FOR TELE-HEALTH VISIT   I hereby voluntarily request, consent and authorize [facility] and its employed or contracted physicians, physician assistants, nurse practitioners or other licensed health care professionals (the Practitioner), to provide me with telemedicine health care services (the "Services") as deemed necessary by the treating Practitioner. I acknowledge and consent to receive the Services by the Practitioner via telemedicine. I understand that the telemedicine visit will involve communicating with the Practitioner through live audiovisual communication technology and the disclosure of certain medical information by electronic transmission. I acknowledge that I have been given the opportunity to request an in-person assessment or other available alternative prior to the telemedicine visit and am voluntarily participating in the telemedicine visit.  I understand that I have the right to withhold or withdraw my consent to the use of telemedicine in the course of my care at any time, without affecting my right to future care or treatment, and that the Practitioner or I may terminate the telemedicine visit at any time. I  understand that I have the right to inspect all information obtained and/or recorded in the course of the telemedicine visit and may receive copies of available information for a reasonable fee.  I understand that some of the potential risks of receiving the Services via telemedicine include:  Marland Kitchen Delay or interruption in medical evaluation due to technological equipment failure or disruption; . Information transmitted may not be sufficient (e.g. poor resolution of images) to allow for appropriate medical decision making by the Practitioner; and/or  . In rare instances, security protocols could fail, causing a breach of personal health information.  Furthermore, I acknowledge that it is my responsibility to provide information about my medical history, conditions and care that is complete and accurate to the best of my ability. I acknowledge that Practitioner's advice, recommendations, and/or decision may be based on factors not within their control, such as incomplete or inaccurate data provided by me or distortions of diagnostic images or specimens that may result from electronic transmissions. I understand that the practice of medicine is not an exact science and that  Practitioner makes no warranties or guarantees regarding treatment outcomes. I acknowledge that I will receive a copy of this consent concurrently upon execution via email to the email address I last provided but may also request a printed copy by calling the office of [place].    I understand that my insurance will be billed for this visit.   I have read or had this consent read to me. . I understand the contents of this consent, which adequately explains the benefits and risks of the Services being provided via telemedicine.  . I have been provided ample opportunity to ask questions regarding this consent and the Services and have had my questions answered to my satisfaction. . I give my informed consent for the services to be  provided through the use of telemedicine in my medical care  By participating in this telemedicine visit I agree to the above.

## 2018-11-14 ENCOUNTER — Telehealth (INDEPENDENT_AMBULATORY_CARE_PROVIDER_SITE_OTHER): Payer: Medicare Other | Admitting: Cardiovascular Disease

## 2018-11-14 ENCOUNTER — Encounter: Payer: Self-pay | Admitting: Cardiology

## 2018-11-14 ENCOUNTER — Telehealth: Payer: Self-pay | Admitting: *Deleted

## 2018-11-14 ENCOUNTER — Other Ambulatory Visit: Payer: Self-pay

## 2018-11-14 DIAGNOSIS — E785 Hyperlipidemia, unspecified: Secondary | ICD-10-CM | POA: Diagnosis not present

## 2018-11-14 DIAGNOSIS — I4819 Other persistent atrial fibrillation: Secondary | ICD-10-CM

## 2018-11-14 DIAGNOSIS — Z950 Presence of cardiac pacemaker: Secondary | ICD-10-CM

## 2018-11-14 DIAGNOSIS — I422 Other hypertrophic cardiomyopathy: Secondary | ICD-10-CM | POA: Diagnosis not present

## 2018-11-14 DIAGNOSIS — I4891 Unspecified atrial fibrillation: Secondary | ICD-10-CM | POA: Diagnosis not present

## 2018-11-14 DIAGNOSIS — I10 Essential (primary) hypertension: Secondary | ICD-10-CM

## 2018-11-14 DIAGNOSIS — I495 Sick sinus syndrome: Secondary | ICD-10-CM

## 2018-11-14 MED ORDER — ESCITALOPRAM OXALATE 20 MG PO TABS: 20.0000 mg | ORAL_TABLET | Freq: Every day | ORAL | 3 refills | Status: DC

## 2018-11-14 NOTE — Patient Instructions (Addendum)
Medication Instructions:  INCREASE the Lexapro to 20 mg once daily  If you need a refill on your cardiac medications before your next appointment, please call your pharmacy.   Lab work: None ordered If you have labs (blood work) drawn today and your tests are completely normal, you will receive your results only by: Marland Kitchen MyChart Message (if you have MyChart) OR . A paper copy in the mail If you have any lab test that is abnormal or we need to change your treatment, we will call you to review the results.  Testing/Procedures: None ordered  Follow-Up: . Follow up with [name] in 3 months . Follow up with Dr. Sallyanne Kuster in 12 months  Any Other Special Instructions Will Be Listed Below (If Applicable). Please discontinue the Atorvastatin for one month and monitor your symptoms. Call the office back in one month for an update at [phone].

## 2018-11-14 NOTE — Telephone Encounter (Signed)
The patient has been instructed on the virtual visit today and changes, 11/14/2018. The AVS will be mailed to her.

## 2018-11-14 NOTE — Progress Notes (Signed)
Virtual Visit via Video Note   This visit type was conducted due to national recommendations for restrictions regarding the COVID-19 Pandemic (e.g. social distancing) in an effort to limit this patient's exposure and mitigate transmission in our community.  Due to her co-morbid illnesses, this patient is at least at moderate risk for complications without adequate follow up.  This format is felt to be most appropriate for this patient at this time.  All issues noted in this document were discussed and addressed.  A limited physical exam was performed with this format.  Please refer to the patient's chart for her consent to telehealth for [facility].   We encountered a lot of difficulty with the video visit.  Eventually we switched to a following visit to decrease the patient's frustration.  Date:  11/14/2018   ID:  LAYONNA Pugh, DOB 1928-05-22, MRN [id number]  Patient Location: Home Provider Location: Home  PCP:  [name], MD  Cardiologist:  [name], MD  Electrophysiologist:  None   Evaluation Performed:  Follow-Up Visit  Chief Complaint: Pacemaker follow-up  History of Present Illness:    Denise Pugh is a 85 y.o. female with a history of tachycardia-bradycardia syndrome for which she received a dual-chamber permanent pacemaker which is now programmed VVIR due to persistent atrial fibrillation with early recurrence after cardioversion.  Additional medical problems include systemic hypertension, hyperlipidemia, obstructive sleep apnea and apical variant hypertrophic cardiomyopathy (which has not been associated with heart failure or ventricular arrhythmia).  She has been in persistent atrial fibrillation for about a year now.  She had a successful cardioversion in January with early recurrence of atrial fibrillation.  She has been evaluated by EP and there are no effective or safe ways to maintain normal rhythm, rate control strategy was recommended.  From a cardiology  point of view she seems to be doing reasonably well.The patient specifically denies any chest pain at rest exertion, dyspnea at rest or with exertion, orthopnea, paroxysmal nocturnal dyspnea, syncope, palpitations, focal neurological deficits, intermittent claudication, lower extremity edema, unexplained weight gain, cough, hemoptysis or wheezing.  She continues to complain of generalized weakness and fatigue back pain and muscle aches.  She has to sit down and rest every few minutes even when doing simple tasks like preparing a meal.  She has not had any new episodes of syncope or near syncope or symptoms of orthostatic hypotension.  Her mood appears better.  She sounds less hopeless than she did at her last appointment.  She is on anticoagulation with Eliquis and has not had any falls, injuries or bleeding complaints.  Pacemaker interrogation shows normal device function.  She is not device dependent.  She has roughly 27% ventricular pacing with a satisfactory heart rate histogram distribution.  There have been no episodes of RVR.  The patient does not have symptoms concerning for COVID-19 infection (fever, chills, cough, or new shortness of breath).    Past Medical History:  Diagnosis Date  . Arrhythmia    HX of SVT. CARDIONET MONITOR 07/15/12 TO 08/13/12  . Arthritis   . Family history of adverse reaction to anesthesia 1970s   mother  . GERD (gastroesophageal reflux disease)   . Hyperlipidemia 08/06/11   lexiscan myoview-normal. Due to severe attenuation the study specificity and sensitivty are deminished.  . Hypertension 08/25/10   ECHO-EF 60-65%  . Hypothyroidism   . Irregular heart beat   . Shortness of breath   . Sleep apnea    SLEEP  STUDY-[facility]  . Thyroid disease    Past Surgical History:  Procedure Laterality Date  . CARDIAC CATHETERIZATION  07/06/01   spade-like configuration  . CARDIOVERSION N/A 06/07/2017   Procedure: CARDIOVERSION;  Surgeon:  [name], MD;  Location: [facility];  Service: Cardiovascular;  Laterality: N/A;  . CARDIOVERSION N/A 04/14/2018   Procedure: CARDIOVERSION;  Surgeon: [name], MD;  Location: [facility];  Service: Cardiovascular;  Laterality: N/A;  . DILATION AND CURETTAGE OF UTERUS    . EYE SURGERY    . KNEE ARTHROSCOPY    . PERMANENT PACEMAKER INSERTION N/A 07/20/2013   Procedure: PERMANENT PACEMAKER INSERTION;  Surgeon: [name], MD;  Location: [facility] CATH LAB;  Service: Cardiovascular;  Laterality: N/A;     Current Meds  Medication Sig  . acetaminophen (TYLENOL) 500 MG tablet Take 500 mg by mouth every 6 (six) hours as needed for moderate pain, fever or headache.   Marland Kitchen atorvastatin (LIPITOR) 20 MG tablet TAKE 1/2 TABLET BY MOUTH ONCE DAILY  . Cholecalciferol (VITAMIN D) 2000 UNITS tablet Take 2,000 Units by mouth daily.  Marland Kitchen dexlansoprazole (DEXILANT) 60 MG capsule Take 60 mg by mouth daily.  Marland Kitchen docusate sodium (COLACE) 100 MG capsule Take 100 mg by mouth 2 (two) times daily.  Marland Kitchen ELIQUIS 5 MG TABS tablet TAKE 1 TABLET BY MOUTH TWO  TIMES DAILY  . escitalopram (LEXAPRO) 10 MG tablet 10 mg. Take 2 tablets once daily  . hydrochlorothiazide (MICROZIDE) 12.5 MG capsule TAKE 1 CAPSULE BY MOUTH  DAILY  . levothyroxine (SYNTHROID) 75 MCG tablet Take 1 tablet (75 mcg total) by mouth daily before breakfast.  . Liniments (DEEP BLUE RELIEF EX) Apply 1 application topically daily as needed (for back, shoulder and knee pain).  . metoprolol tartrate (LOPRESSOR) 50 MG tablet Take 1.5 tablets (75 mg total) by mouth 2 (two) times daily.  . Multiple Vitamins-Minerals (PRESERVISION AREDS 2 PO) Take 2 tablets by mouth daily.  . mupirocin ointment (BACTROBAN) 2 % Place 1 application into the nose daily as needed (for dry nose).  Marland Kitchen OVER THE COUNTER MEDICATION Place 1 drop into both ears daily. Organic Ear Oil  . oxybutynin (DITROPAN) 5 MG tablet Take 5 mg by mouth 2 (two) times daily.   . sodium chloride (OCEAN)  0.65 % SOLN nasal spray Place 1 spray into both nostrils as needed for congestion.  . traMADol (ULTRAM) 50 MG tablet Take 100 mg by mouth 2 (two) times daily.      Allergies:   Penicillins and Tetanus toxoid   Social History   Tobacco Use  . Smoking status: Never Smoker  . Smokeless tobacco: Never Used  Substance Use Topics  . Alcohol use: No    Alcohol/week: 0.0 standard drinks  . Drug use: No     Family Hx: The patient's family history includes Diabetes in her father and mother; Heart attack in her maternal grandfather and maternal grandmother; Hypertension in her father.  ROS:   Please see the history of present illness.     All other systems are reviewed and are negative  Prior CV studies:   The following studies were reviewed today:  Comprehensive pacemaker download November 03, 2018  Labs/Other Tests and Data Reviewed:    EKG: No ECG performed today. An ECG dated 06/01/2018 was personally reviewed today and demonstrated:  Atrial fibrillation with controlled ventricular rate and rare ventricular pacing, incomplete right bundle branch block, nonspecific repolarization changes  Recent Labs: 03/30/2018: BUN 13; Creatinine,  Ser 0.98; Hemoglobin 14.2; Platelets 191; Potassium 3.8; Sodium 134 04/26/2018: TSH 2.030   Recent Lipid Panel Lab Results  Component Value Date/Time   CHOL 120 07/19/2013 05:39 AM   TRIG 52 07/19/2013 05:39 AM   HDL 45 07/19/2013 05:39 AM   CHOLHDL 2.7 07/19/2013 05:39 AM   LDLCALC 65 07/19/2013 05:39 AM    Wt Readings from Last 3 Encounters:  11/14/18 168 lb (76.2 kg)  09/28/18 165 lb (74.8 kg)  07/27/18 165 lb (74.8 kg)     Objective:    Vital Signs:  BP (!) 133/99   Pulse 66   Ht 5\' 3"  (1.6 m)   Wt 168 lb (76.2 kg)   BMI 29.76 kg/m    Vital signs were reviewed, exam is not possible. I asked her to recheck her blood pressure and it was 130/62.  ASSESSMENT & PLAN:    1. AFib: Good ventricular rate control, on appropriate  anticoagulation.  Evaluated by EP and rate control strategy recommended. 2. PPM: Normal pacemaker function.  Continue remote downloads every 3 months and yearly office visits. 3. Apical HCM: This may be associated with diastolic dysfunction and explain why she does not tolerate atrial fibrillation well.  He does not have evidence of left ventricular outflow tract obstruction by echo or clinically. 4. Anticoagulation: Compliant with anticoagulation, no recent falls injuries or bleeding problems. 5. HTN: I think her blood pressure is well controlled.  Discussed the less reliable readings with automatic blood pressure cuffs when the rhythm is irregular.  She should check her blood pressure 3 times consecutively and take the middle value as the one closest to real blood pressure. 6. HLP: Today her complaints sound more consistent with muscle fatigue and weakness as well as generalized aches and pains, particularly prominent in her lower back.  I recommended a one-month atorvastatin "holiday" to see if the symptoms improve. 7. Depression: She seems better compensated after increasing her Lexapro to 20 mg daily and I renewed the prescription for this.  COVID-19 Education: The signs and symptoms of COVID-19 were discussed with the patient and how to seek care for testing (follow up with PCP or arrange E-visit).  The importance of social distancing was discussed today.  Time:   Today, I have spent 21 minutes with the patient with telehealth technology discussing the above problems.     Medication Adjustments/Labs and Tests Ordered: Current medicines are reviewed at length with the patient today.  Concerns regarding medicines are outlined above.   Tests Ordered: No orders of the defined types were placed in this encounter.   Medication Changes: No orders of the defined types were placed in this encounter.  Patient Instructions  Medication Instructions:  INCREASE the Lexapro to 20 mg once  daily  If you need a refill on your cardiac medications before your next appointment, please call your pharmacy.   Lab work: None ordered If you have labs (blood work) drawn today and your tests are completely normal, you will receive your results only by: Marland Kitchen MyChart Message (if you have MyChart) OR . A paper copy in the mail If you have any lab test that is abnormal or we need to change your treatment, we will call you to review the results.  Testing/Procedures: None ordered  Follow-Up: . Follow up with [name] in 3 months . Follow up with Dr. Sallyanne Kuster in 12 months  Any Other Special Instructions Will Be Listed Below (If Applicable). Please discontinue the Atorvastatin for one month  and monitor your symptoms. Call the office back in one month for an update at [phone].     Disposition:  Follow up 3 months  Signed, [name], MD  11/14/2018 12:12 PM    [place]

## 2018-11-14 NOTE — Progress Notes (Signed)
Remote pacemaker transmission.   

## 2018-12-05 ENCOUNTER — Telehealth: Payer: Self-pay | Admitting: Cardiovascular Disease

## 2018-12-05 NOTE — Telephone Encounter (Signed)
Patient called stating she needs a new script sent to [facility] for escitalopram (LEXAPRO) 20 MG tablet, they told her the medication doesn't come in 20mg .  The number is [phone], fax [phone],

## 2018-12-05 NOTE — Telephone Encounter (Signed)
Spoke to patient she stated she received Lexapro 20 mg in mail this afternoon.Stated she does not need a refill.

## 2018-12-12 ENCOUNTER — Telehealth: Payer: Self-pay | Admitting: Cardiovascular Disease

## 2018-12-12 NOTE — Telephone Encounter (Signed)
Routed to MD & nurse concerning request for letter

## 2018-12-12 NOTE — Telephone Encounter (Signed)
New Message    Patient states she needs a letter faxed to waste management because she needs help with her trash and was told if the doctor faxes a letter to [phone] saying so then they will accommodate her.  Please call patient to discuss.

## 2018-12-14 NOTE — Telephone Encounter (Signed)
Ok to write note on her behalf

## 2018-12-15 NOTE — Telephone Encounter (Signed)
Letter written- will fax over letter to waste management.

## 2018-12-24 ENCOUNTER — Other Ambulatory Visit: Payer: Self-pay | Admitting: Cardiovascular Disease

## 2018-12-27 ENCOUNTER — Telehealth: Payer: Self-pay | Admitting: Cardiovascular Disease

## 2018-12-27 NOTE — Telephone Encounter (Signed)
Called patient, she states that she was told to stop Atorvastatin- for one month- she states that she has not had any change. Patient seems confused on her medications- but she states the increase in Lexapro has not helped, and she is still very fatigued and does not have the desire to do anything. I advised that Dr.Croitoru wanted to know any changes after stopping Atorvastatin, but she states there is not change. Will make MD aware.

## 2018-12-27 NOTE — Telephone Encounter (Signed)
I would take that to mean that the atorvastatin is not responsible for her aches and pains and I believe she should start taking it again.

## 2018-12-27 NOTE — Telephone Encounter (Signed)
New message:    Patient calling stating that Dr.Croitour wanted to take some medication for one month and then give a call. Please call patient.

## 2018-12-28 NOTE — Telephone Encounter (Signed)
Left a message for the patient to call back.  

## 2018-12-28 NOTE — Telephone Encounter (Signed)
No message needed °

## 2018-12-28 NOTE — Telephone Encounter (Signed)
The patient stated that she has not felt better since being off of the atorvastatin holiday for a month. She has therefore been advised to restart the atorvastatin and has verbalized her understanding.  She was under the impression that she was to increase the Lexapro to 20 mg for one month and then see how she felt. She was advised that the month off from the atorvastatin was to see if she felt better with her muscle aches (which was not better). She stated that she is still very tired and weak which is making her more depressed. She denies chest pain, edema or shortness of breath.  She has a follow up with [name] December 2nd.

## 2018-12-29 NOTE — Telephone Encounter (Signed)
I do not think seeing a Cardiology APP sooner will help. Keep appt w [name], please.

## 2018-12-29 NOTE — Telephone Encounter (Signed)
The patient has verbalized her understanding but would like to know if she could see [name] sooner. Message will be routed to his nurse to see if this is a possibility.

## 2018-12-30 NOTE — Telephone Encounter (Signed)
Pt states she can do the Oct 23rd at 4:20pm spot. I do not schedule appointments for [facility]. I told the pt I will send a phone note to Caprice Beaver, LPN and ask her to give the pt a call back to confirm the appointment and time. She would like to know which doctor this appointment is for? I told her it looks like the appointment is for Dr Claiborne Billings but is not sure.

## 2018-12-30 NOTE — Telephone Encounter (Signed)
The patient have been notified of her appointment on 10/23 at 4:20 with [name]. She verbalized her understanding.

## 2018-12-30 NOTE — Telephone Encounter (Signed)
Lets see if we an do Octo 23rd at 4:20. Thanks!  That is an open spot. Ill place a hold.

## 2019-01-04 DIAGNOSIS — G894 Chronic pain syndrome: Secondary | ICD-10-CM | POA: Diagnosis not present

## 2019-01-04 DIAGNOSIS — R5382 Chronic fatigue, unspecified: Secondary | ICD-10-CM | POA: Diagnosis not present

## 2019-01-09 DIAGNOSIS — R5383 Other fatigue: Secondary | ICD-10-CM | POA: Diagnosis not present

## 2019-01-09 DIAGNOSIS — I482 Chronic atrial fibrillation, unspecified: Secondary | ICD-10-CM | POA: Diagnosis not present

## 2019-01-09 DIAGNOSIS — E039 Hypothyroidism, unspecified: Secondary | ICD-10-CM | POA: Diagnosis not present

## 2019-01-09 DIAGNOSIS — M1991 Primary osteoarthritis, unspecified site: Secondary | ICD-10-CM | POA: Diagnosis not present

## 2019-01-11 DIAGNOSIS — E559 Vitamin D deficiency, unspecified: Secondary | ICD-10-CM | POA: Diagnosis not present

## 2019-01-11 DIAGNOSIS — K219 Gastro-esophageal reflux disease without esophagitis: Secondary | ICD-10-CM | POA: Diagnosis not present

## 2019-01-11 DIAGNOSIS — I4891 Unspecified atrial fibrillation: Secondary | ICD-10-CM | POA: Diagnosis not present

## 2019-01-11 DIAGNOSIS — E782 Mixed hyperlipidemia: Secondary | ICD-10-CM | POA: Diagnosis not present

## 2019-01-13 ENCOUNTER — Encounter: Payer: Self-pay | Admitting: Cardiovascular Disease

## 2019-01-13 ENCOUNTER — Ambulatory Visit: Payer: Medicare Other | Admitting: Cardiovascular Disease

## 2019-01-13 ENCOUNTER — Other Ambulatory Visit: Payer: Self-pay

## 2019-01-13 VITALS — BP 132/85 | HR 113 | Ht 63.0 in | Wt 172.2 lb

## 2019-01-13 DIAGNOSIS — I10 Essential (primary) hypertension: Secondary | ICD-10-CM

## 2019-01-13 DIAGNOSIS — I4821 Permanent atrial fibrillation: Secondary | ICD-10-CM | POA: Diagnosis not present

## 2019-01-13 DIAGNOSIS — I517 Cardiomegaly: Secondary | ICD-10-CM

## 2019-01-13 DIAGNOSIS — Z7901 Long term (current) use of anticoagulants: Secondary | ICD-10-CM | POA: Diagnosis not present

## 2019-01-13 DIAGNOSIS — E039 Hypothyroidism, unspecified: Secondary | ICD-10-CM

## 2019-01-13 DIAGNOSIS — G4733 Obstructive sleep apnea (adult) (pediatric): Secondary | ICD-10-CM | POA: Diagnosis not present

## 2019-01-13 MED ORDER — METOPROLOL TARTRATE 50 MG PO TABS: ORAL_TABLET | ORAL | 3 refills | Status: DC

## 2019-01-13 NOTE — Patient Instructions (Signed)
Medication Instructions:  INCREASE METOPROLOL 100MG  IN THE AM AND 75MG  IN THE PM If you need a refill on your cardiac medications before your next appointment, please call your pharmacy.  Testing/Procedures: Your physician has recommended that you have a sleep study. This test records several body functions during sleep, including: brain activity, eye movement, oxygen and carbon dioxide blood levels, heart rate and rhythm, breathing rate and rhythm, the flow of air through your mouth and nose, snoring, body muscle movements, and chest and belly movement. SOMEONE WILL BE CALLING YOU TO SCHEDULE THIS APPOINTMENT AT [facility].  Follow-Up: IN 4 months 05-10-2018 @110PM  WITH DR Claiborne Billings HERE AT THE [facility] OFFICE.  At [facility], you and your health needs are our priority.  As part of our continuing mission to provide you with exceptional heart care, we have created designated Provider Care Teams.  These Care Teams include your primary Cardiologist (physician) and Advanced Practice Providers (APPs -  Physician Assistants and Nurse Practitioners) who all work together to provide you with the care you need, when you need it.  Thank you for choosing [facility] at [facility]!!

## 2019-01-13 NOTE — Progress Notes (Signed)
Patient ID: Denise Pugh, female   DOB: 08-03-1928, 85 y.o.   MRN: [id number]     HPI: Denise Pugh is a 85 y.o. female who presents to the office today for a 10 month followup cardiology evaluation.  [name] has a history of SVTand moderate left ventricular hypertrophy with documented "spade-like ventricle."  She has previously documented diffuse T-wave abnormalities. A stress test in May 2013 showed breast attenuation artifact with a post stress ejection fraction of 70% without evidence for scar or ischemia. She had experienced recurrent episodes of palpitations. A cardiac monitor revealed episodes of SVT up to 185 beats per minute and her beta blocker dose was increased.  A CardioNet monitor on her increased dose of Toprol-XL 25 mg still revealed bursts of atrial tachycardia/A flutter rate at 189 beats per minute.  On  May 17 she had an episode of tachycardia palpitations and did also have episodes of bradycardia; some of her spells suggested SVT at 150  in addition to episode of accelerated idioventricular rhythm when she was sinus bradycardic.  A 2-D echo Doppler study on 09/15/2012 showed an ejection fraction of 40-08%,QPYPP 1 diastolic dysfunction and elevated LV filling pressures.  The aortic valve was as sclerotic without stenosis and there was mild/moderate central regurgitation. 10 mild mitral regurgitation and mild tricuspid regurgitation with mild pulmonary hypertension with a PA estimate pressure at 47 mm.  In April 2015, she was admitted to [facility] hospital with atrial fibrillation with a rapid ventricular response.  She also had a CardioNet monitor which had shown episodes of sinus bradycardia, as well as PAF, with rates up to 170 beats per minute with also questionable nonsustained VT versus actual fibrillation with aberrancy.  She was started on amiodarone. Her Eliquis was held, and she underwent permanent pacemaker insertion.  She was seen in August 2015 by Dr. Sallyanne Pugh for  pacemaker follow-up.  Her underlying rhythm was sinus bradycardia at 38 bpm, and she was pacing the atrium greater than 99% of the time without hardly any episodes of ventricular pacing.  She recently saw Dr. Sallyanne Pugh for one-year evaluation in October 2016.  She has both sinus node dysfunction and AV conduction abnormalities.  Her pacemaker is programmed with MVP on and she has a very long AV conduction time so as to prevent ventricular pacing.  She underwent a pacemaker evaluation with Dr. Sallyanne Pugh was in October 2017.   She has been undergoing 3 month interval device checks.  She was atrially paced and ventricular sensing with a long AV delay.  Estimated longevity is 6.5 years.  There is not been any detected atrial fibrillation or ventricular tachycardia.   She has had difficulty with low back discomfort making it difficult to walk.  She has been undergoing physical therapy.  She also received injections and also had acupuncture.  She admits to purposeful weight loss over the last year and has lost approximately 20 pounds.  She denies any episodes of chest pain.  There have been no episodes of recurrent atrial fibrillation.  She continues to be on amiodarone 200 mg daily and takes eliquis 5 mg twice a day for anticoagulation.  She continues to be on losartan HCT and metoprolol for hypertension.    She was seen by Dr. Sallyanne Pugh early March 2019.  Pacemaker interrogation showed uninterrupted atrial fibrillation since late December 2018.  Rate control was good averaging in the 80s.  She had noticed more shortness of breath and was symptomatic with AF and had  been compliant with anticoagulation she underwent cardioversion on June 07, 2017 successfully.  Postprocedure ECG showed atrially paced and ventricular sensed rhythm.  She has not noticed any major change in her symptoms following the cardioversion.  She denies chest pain.  She denies palpitations.  She has been maintained on amiodarone 400 mg daily in  addition to metoprolol 50 mg twice a day.  She is on Eliquis 5 mg twice a day.  She continues with atorvastatin for hyperlipidemia.  She has had some issues with osteoarthritis.    I saw her in June 23, 2017 at which time she was atrially paced prolonged AV conduction.  I recommended that she reduce her amiodarone to 300 mg daily.  Subsequently, amiodarone dose was reduced to 200 mg daily  I  saw her in June 2019 at which time she was maintaining sinus rhythm.  She was having some mild lower extremity edema and her blood pressure was elevated; HCTZ 12.5 mg added to her regimen.  I last saw her in December 2019 at which time she was back in atrial fibrillation.  She was on amiodarone 200 mg daily, Eliquis 5 mg twice a day, metoprolol 50 mg twice a day in addition to HCTZ.  I recommended short-term increase in amiodarone back to 200 mg twice a day.  Since I have seen her, she has had follow-up evaluations with Dr. Sallyanne Pugh, Denise Sims, NP,  andDr. Rayann Pugh.  She is now considered permanent atrial fibrillation.  There was some discussion about possible sideration for ablation which she had seen Dr. Rayann Pugh who did not recommend this approach.  She has had issues with significant fatigability and continues to be tired.  Of note, remotely she had been diagnosed with sleep apnea over 10 years ago and was on CPAP therapy until her husband became ill 2013 since she had a take care of him all the time.  She has not been on CPAP therapy for at least 7 years.  Presently she typically is going to bed between 11 PM and midnight and typically wakes up at noon.  She does snore.  When she wakes up she is still very tired.  She has recently seen Dr. Wende Pugh who checked laboratory and she was told that her labs were stable.  The patient was taken off amiodarone by Dr. Sallyanne Pugh and she has been on an increase metoprolol regimen at 75 mg twice a day.  She presents for follow-up evaluation.  Past Medical History:    Diagnosis Date   Arrhythmia    HX of SVT. CARDIONET MONITOR 07/15/12 TO 08/13/12   Arthritis    Family history of adverse reaction to anesthesia 1970s   mother   GERD (gastroesophageal reflux disease)    Hyperlipidemia 08/06/11   lexiscan myoview-normal. Due to severe attenuation the study specificity and sensitivty are deminished.   Hypertension 08/25/10   ECHO-EF 60-65%   Hypothyroidism    Irregular heart beat    Shortness of breath    Sleep apnea    SLEEP STUDY-[facility]   Thyroid disease     Past Surgical History:  Procedure Laterality Date   CARDIAC CATHETERIZATION  07/06/01   spade-like configuration   CARDIOVERSION N/A 06/07/2017   Procedure: CARDIOVERSION;  Surgeon: Denise Klein, MD;  Location: [place];  Service: Cardiovascular;  Laterality: N/A;   CARDIOVERSION N/A 04/14/2018   Procedure: CARDIOVERSION;  Surgeon: Denise Klein, MD;  Location: [place];  Service: Cardiovascular;  Laterality: N/A;  DILATION AND CURETTAGE OF UTERUS     EYE SURGERY     KNEE ARTHROSCOPY     PERMANENT PACEMAKER INSERTION N/A 07/20/2013   Procedure: PERMANENT PACEMAKER INSERTION;  Surgeon: Denise Klein, MD;  Location: [place] CATH LAB;  Service: Cardiovascular;  Laterality: N/A;    Allergies  Allergen Reactions   Penicillins Other (See Comments)    Has patient had a PCN reaction causing immediate rash, facial/tongue/throat swelling, SOB or lightheadedness with hypotension: Unknown Has patient had a PCN reaction causing severe rash involving mucus membranes or skin necrosis: Unknown Has patient had a PCN reaction that required hospitalization: Unknown Has patient had a PCN reaction occurring within the last 10 years: Unknown If all of the above answers are "NO", then may proceed with Cephalosporin use.    Tetanus Toxoid Swelling    Current Outpatient Medications  Medication Sig Dispense Refill   acetaminophen (TYLENOL) 500 MG tablet  Take 500 mg by mouth every 6 (six) hours as needed for moderate pain, fever or headache.      atorvastatin (LIPITOR) 20 MG tablet TAKE 1/2 TABLET BY MOUTH ONCE DAILY 15 tablet 4   Cholecalciferol (VITAMIN D) 2000 UNITS tablet Take 2,000 Units by mouth daily.     dexlansoprazole (DEXILANT) 60 MG capsule Take 60 mg by mouth daily.     docusate sodium (COLACE) 100 MG capsule Take 100 mg by mouth 2 (two) times daily.     ELIQUIS 5 MG TABS tablet TAKE 1 TABLET BY MOUTH TWO  TIMES DAILY 180 tablet 1   escitalopram (LEXAPRO) 20 MG tablet Take 1 tablet (20 mg total) by mouth daily. 90 tablet 3   hydrochlorothiazide (MICROZIDE) 12.5 MG capsule TAKE 1 CAPSULE BY MOUTH  DAILY 90 capsule 0   levothyroxine (SYNTHROID) 75 MCG tablet Take 1 tablet (75 mcg total) by mouth daily before breakfast. 90 tablet 3   Liniments (DEEP BLUE RELIEF EX) Apply 1 application topically daily as needed (for back, shoulder and knee pain).     metoprolol tartrate (LOPRESSOR) 50 MG tablet Take 2 tablets (100 mg total) by mouth every morning AND 1.5 tablets (75 mg total) every evening. 270 tablet 3   Multiple Vitamins-Minerals (PRESERVISION AREDS 2 PO) Take 2 tablets by mouth daily.     mupirocin ointment (BACTROBAN) 2 % Place 1 application into the nose daily as needed (for dry nose).     OVER THE COUNTER MEDICATION Place 1 drop into both ears daily. Organic Ear Oil     oxybutynin (DITROPAN) 5 MG tablet Take 5 mg by mouth 2 (two) times daily.      sodium chloride (OCEAN) 0.65 % SOLN nasal spray Place 1 spray into both nostrils as needed for congestion.     traMADol (ULTRAM) 50 MG tablet Take 100 mg by mouth 2 (two) times daily.      No current facility-administered medications for this visit.    Social history is notable in that she is now. Her husband did suffer a CVA and has been having more difficulty at home. She has one child, 2 step sons, one grandchild and 2 great-grandchildren. There is no tobacco or alcohol  use. She does admit to frequent anxiety.  ROS General: Negative; No fevers, chills, or night sweats; positive for fatigue HEENT: Negative; No changes in vision or hearing, sinus congestion, difficulty swallowing Pulmonary: Negative; No cough, wheezing, shortness of breath, hemoptysis Cardiovascular: see HPI GI: Negative; No nausea, vomiting, diarrhea, or abdominal pain GU: Negative; No dysuria, hematuria,  or difficulty voiding Musculoskeletal: Positive for low back pain and osteoarthritis Hematologic/Oncology: Negative; no easy bruising, bleeding Endocrine: Negative; no heat/cold intolerance; no diabetes Neuro: Negative; no changes in balance, headaches Skin: Negative; No rashes or skin lesions Psychiatric: Negative; No behavioral problems, depression Sleep: Positive for snoring, fatigue, daytime sleepiness, remote history of sleep apnea and previously used CPAP daytime sleepiness, hypersomnolence, bruxism, restless legs, hypnogognic hallucinations, no cataplexy Other comprehensive 14 point system review is negative.   PE BP 132/85    Pulse (!) 113    Ht '5\' 3"'  (1.6 m)    Wt 172 lb 3.2 oz (78.1 kg)    SpO2 96%    BMI 30.50 kg/m    Repeat blood pressure by me was 130/78  Wt Readings from Last 3 Encounters:  01/13/19 172 lb 3.2 oz (78.1 kg)  11/14/18 168 lb (76.2 kg)  09/28/18 165 lb (74.8 kg)   2016: Weight 181 pounds    General: Alert, oriented, no distress.  Skin: normal turgor, no rashes, warm and dry HEENT: Normocephalic, atraumatic. Pupils equal round and reactive to light; sclera anicteric; extraocular muscles intact;  Nose without nasal septal hypertrophy Mouth/Parynx benign; Mallinpatti scale 3 Neck: No JVD, no carotid bruits; normal carotid upstroke Lungs: clear to ausculatation and percussion; no wheezing or rales Chest wall: without tenderness to palpitation Heart: PMI not displaced, irregular irregular, tachycardic with ventricular rate at 113 bpm,  s1 s2 normal, 1/6  systolic murmur, no diastolic murmur, no rubs, gallops, thrills, or heaves Abdomen: soft, nontender; no hepatosplenomehaly, BS+; abdominal aorta nontender and not dilated by palpation. Back: no CVA tenderness Pulses 2+ Musculoskeletal: full range of motion, normal strength, no joint deformities Extremities: no clubbing cyanosis or edema, Homan's sign negative  Neurologic: grossly nonfocal; Cranial nerves grossly wnl Psychologic: Normal mood and affect   ECG (independently read by me): Atrial fibrillation at 113; IRBBB, NSSTT changes  December 2019 ECG (independently read by me): Atrial fibrillation at 83 bpm with  nonspecific ST-T changes . The last 3 beats of the ECG show ventricular pacing and 1 beat with AV pacing   September 14, 2017 ECG (independently read by me): Atrially paced rhythm at 64 bpm.  Prolonged AV conduction with PR interval of '2 6 6 ' ms.  Inferolateral ST wave abnormality.  TC interval 443 ms.  June 23, 2017 ECG (independently read by me): Atrially paced with prolonged AV conduction with a PR interval at 276.  Ventricular rate 63.  Anterolateral T wave changes  October 2018 ECG (independently read by me): Atrially paced rhythm at 69 bpm.  Prolonged AV conduction at 298 ms. ST-T abnormality anterolaterally.  January 2018 ECG (independently read by me): Atrial paced rhythm at 68 bpm.  Prolonged AV conduction at 300 ms. ,  Incomplete right bundle-branch block Anterolateral ST-T changes  April 2017 ECG (independently read by me): Sinus rhythm with markedly prolonged AV interval at ~ 440 msec.  Right bundle branch block  November 2016 ECG (independently read by me): 100% atrial pacing with a markedly prolonged PR interval at 480 ms.  Ventricular sensing.  Incomplete right bundle branch block.  ECG (independently read by me): Atrial paced rhythm at 86 bpm with prolonged PR segment.  T-wave inversion  December 2015 ECG (independently read by me): Atrially paced rhythm with  ventricular sensing with incomplete right bundle branch block.  Previously noted T-wave abnormalities  07/24/2013 ECG: Atrial lead paced rhythm.  Mild RV conduction delay.  Diffuse T-wave inversion has been present previously  Prior ECG today  (independently read by me): Bradycardia at 48 beats per minute. RV conduction delay. Previously noted diffuse inferior and anterolateral T wave inversion  Prior ECG of 01/20/2013: sinus bradycardia at 55 beats per minute. Previously noted diffuse T-wave inversion in leads 47F, V2 through V6. Mild RV conduction delay.  LABS: BMP Latest Ref Rng & Units 03/30/2018 06/01/2017 03/21/2017  Glucose 65 - 99 mg/dL 67 82 90  BUN 8 - 27 mg/dL '13 20 15  ' Creatinine 0.57 - 1.00 mg/dL 0.98 1.36(H) 0.94  BUN/Creat Ratio 12 - '28 13 15 ' -  Sodium 134 - 144 mmol/L 134 137 134(L)  Potassium 3.5 - 5.2 mmol/L 3.8 5.2 3.4(L)  Chloride 96 - 106 mmol/L 91(L) 97 96(L)  CO2 20 - 29 mmol/L '27 23 24  ' Calcium 8.7 - 10.3 mg/dL 8.8 9.0 9.1   Hepatic Function Latest Ref Rng & Units 03/21/2017 07/18/2013 08/10/2012  Total Protein 6.5 - 8.1 g/dL 7.8 7.0 6.8  Albumin 3.5 - 5.0 g/dL 4.0 3.6 4.1  AST 15 - 41 U/L 34 26 17  ALT 14 - 54 U/L '29 31 16  ' Alk Phosphatase 38 - 126 U/L 95 119(H) 85  Total Bilirubin 0.3 - 1.2 mg/dL 0.9 0.3 0.4   CBC Latest Ref Rng & Units 03/30/2018 06/01/2017 03/21/2017  WBC 3.4 - 10.8 x10E3/uL 5.4 6.6 8.7  Hemoglobin 11.1 - 15.9 g/dL 14.2 14.6 14.6  Hematocrit 34.0 - 46.6 % 42.2 46.1 43.8  Platelets 150 - 450 x10E3/uL 191 187 195   Lab Results  Component Value Date   MCV 89 03/30/2018   MCV 91 06/01/2017   MCV 91.4 03/21/2017   Lab Results  Component Value Date   TSH 2.030 04/26/2018   Lipid Panel     Component Value Date/Time   CHOL 120 07/19/2013 0539   TRIG 52 07/19/2013 0539   HDL 45 07/19/2013 0539   CHOLHDL 2.7 07/19/2013 0539   VLDL 10 07/19/2013 0539   LDLCALC 65 07/19/2013 0539   RADIOLOGY: No results found.  IMPRESSION:  1.  Obstructive sleep apnea syndrome   2. Permanent atrial fibrillation ([place])   3. Long term current use of anticoagulant   4. Hypertension, essential   5. Left ventricular hypertrophy   6. Hypothyroidism (acquired)    ASSESSMENT AND PLAN:   Ms. Scheunemann is an 85 year old Caucasian female who has a history of SVT and documented moderate left ventricle hypertrophy  with a "spade-like ventricle."  She had diffuse T-wave abnormalities which have been present for years. In 2003 cardiac catheterization did not demonstrate coronary obstructive disease but she had mild midsystolic bridging not felt to be significant in her LAD territory.  She has a history of SVT, PAF, and had developed bradycardia arrhythmia resulting in her permanent pacemaker implantation in 2015.  She was demonstrated to have persistent atrial fibrillation.  She has a cha2ds2vasc score of 4.  She underwent successful DC cardioversion on June 07, 2017.  She had been maintaining sinus rhythm and had been without recurrent atrial fibrillation when I last saw her in December 2019 she was back in atrial fibrillation.  Currently she has been in atrial fibrillation ever since.  The decision was made to proceed with longstanding persistent AF which is now permanent.  The decision is made that she is not a candidate for ablation.  Atrial fibrillation rate today is increased at 113 despite taking metoprolol 75 mg twice a day.  I have recommended slight titration of her a.m.  dose to 100 mg but she will continue the 75 mg twice a day.  She admits to significant fatigability.  She is on levothyroxine and apparently complete laboratory was checked by Dr. [place] Pugh she was told that her labs were stable.  She has been sleeping almost 12 hours per night and admits to still waking up extremely tired.  Remotely she was diagnosed with sleep apnea and had been on CPAP therapy for several years and felt significantly improved.  She stopped using this when her husband became  gravely ill and she felt she needed to be awake in the event she needed his assistance.  As result she has not used CPAP since he has died.  I suspect her untreated sleep apnea may be contributing to her marked fatigability.  I have recommended a follow-up evaluation for sleep apnea and she would like to pursue this.  She continues to be on Eliquis for anticoagulation.  She continues to be on atorvastatin for hyperlipidemia.  She has significant arthritis following her knees, back, hips and shoulders for which she takes tramadol.  I will see her in 4 months for reevaluation or sooner if problems arise.   Time spent: 25 minutes  Denise Pugh M.D., [facility] 01/13/2019 5:50 PM

## 2019-02-02 ENCOUNTER — Ambulatory Visit (INDEPENDENT_AMBULATORY_CARE_PROVIDER_SITE_OTHER): Payer: Medicare Other | Admitting: *Deleted

## 2019-02-02 DIAGNOSIS — I495 Sick sinus syndrome: Secondary | ICD-10-CM

## 2019-02-02 DIAGNOSIS — I4819 Other persistent atrial fibrillation: Secondary | ICD-10-CM | POA: Diagnosis not present

## 2019-02-02 LAB — CUP PACEART REMOTE DEVICE CHECK
Battery Remaining Longevity: 34 mo
Battery Voltage: 2.96 V
Brady Statistic AP VP Percent: 0.97 %
Brady Statistic AP VS Percent: 0.2 %
Brady Statistic AS VP Percent: 23.15 %
Brady Statistic AS VS Percent: 75.68 %
Brady Statistic RA Percent Paced: 0.78 %
Brady Statistic RV Percent Paced: 19.37 %
Date Time Interrogation Session: 20201112212920
Implantable Lead Implant Date: 20150430
Implantable Lead Implant Date: 20150430
Implantable Lead Location: 753859
Implantable Lead Location: 753860
Implantable Lead Model: 5076
Implantable Lead Model: 5076
Implantable Pulse Generator Implant Date: 20150430
Lead Channel Impedance Value: 361 Ohm
Lead Channel Impedance Value: 418 Ohm
Lead Channel Impedance Value: 513 Ohm
Lead Channel Impedance Value: 532 Ohm
Lead Channel Pacing Threshold Amplitude: 0.875 V
Lead Channel Pacing Threshold Amplitude: 1.125 V
Lead Channel Pacing Threshold Pulse Width: 0.4 ms
Lead Channel Pacing Threshold Pulse Width: 0.4 ms
Lead Channel Sensing Intrinsic Amplitude: 1.125 mV
Lead Channel Sensing Intrinsic Amplitude: 1.125 mV
Lead Channel Sensing Intrinsic Amplitude: 20.625 mV
Lead Channel Sensing Intrinsic Amplitude: 20.625 mV
Lead Channel Setting Pacing Amplitude: 2.25 V
Lead Channel Setting Pacing Amplitude: 2.5 V
Lead Channel Setting Pacing Pulse Width: 0.4 ms
Lead Channel Setting Sensing Sensitivity: 4 mV

## 2019-02-03 IMAGING — DX DG KNEE COMPLETE 4+V*R*
4 series · 4 of 4 positions shown · non-contrast
Comparison: None.

CLINICAL DATA: Right leg pain for 3 days.  No injury.

EXAM:
RIGHT KNEE - COMPLETE 4+ VIEW

[knee ap]
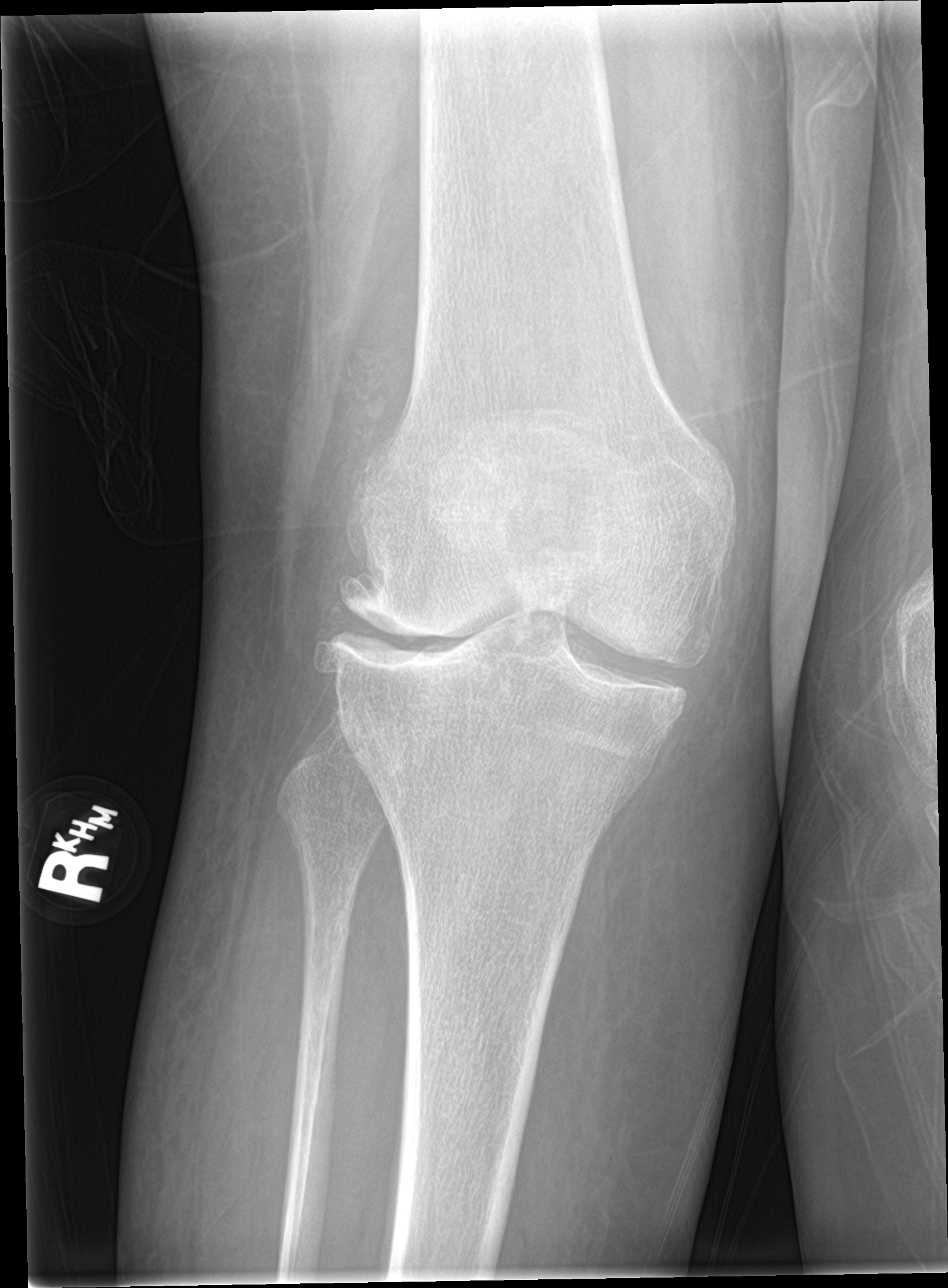

[tunnel]
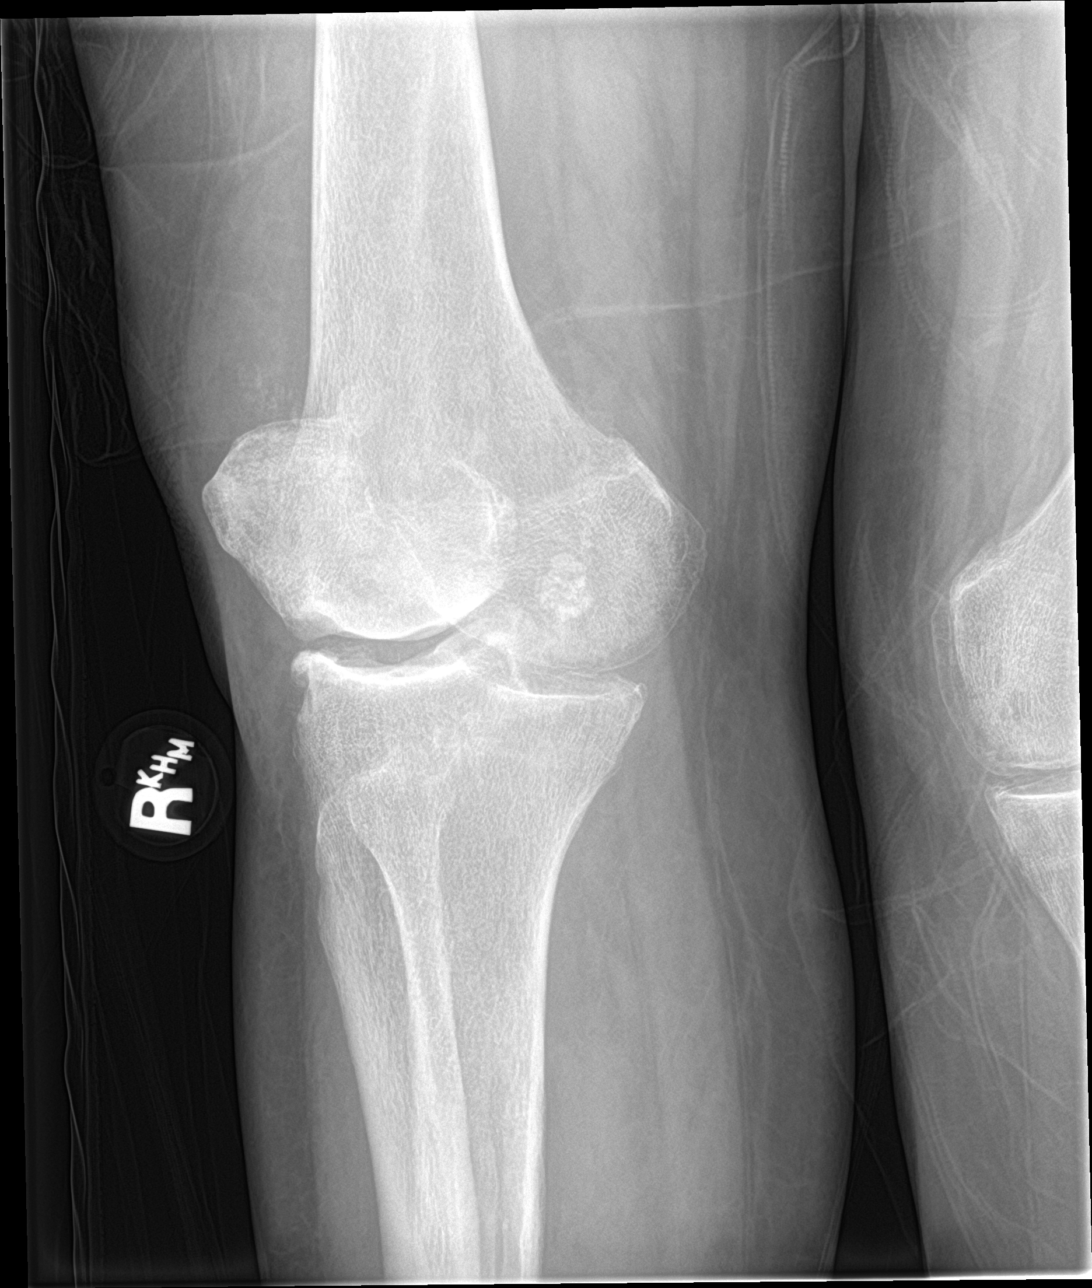

[knee lat]
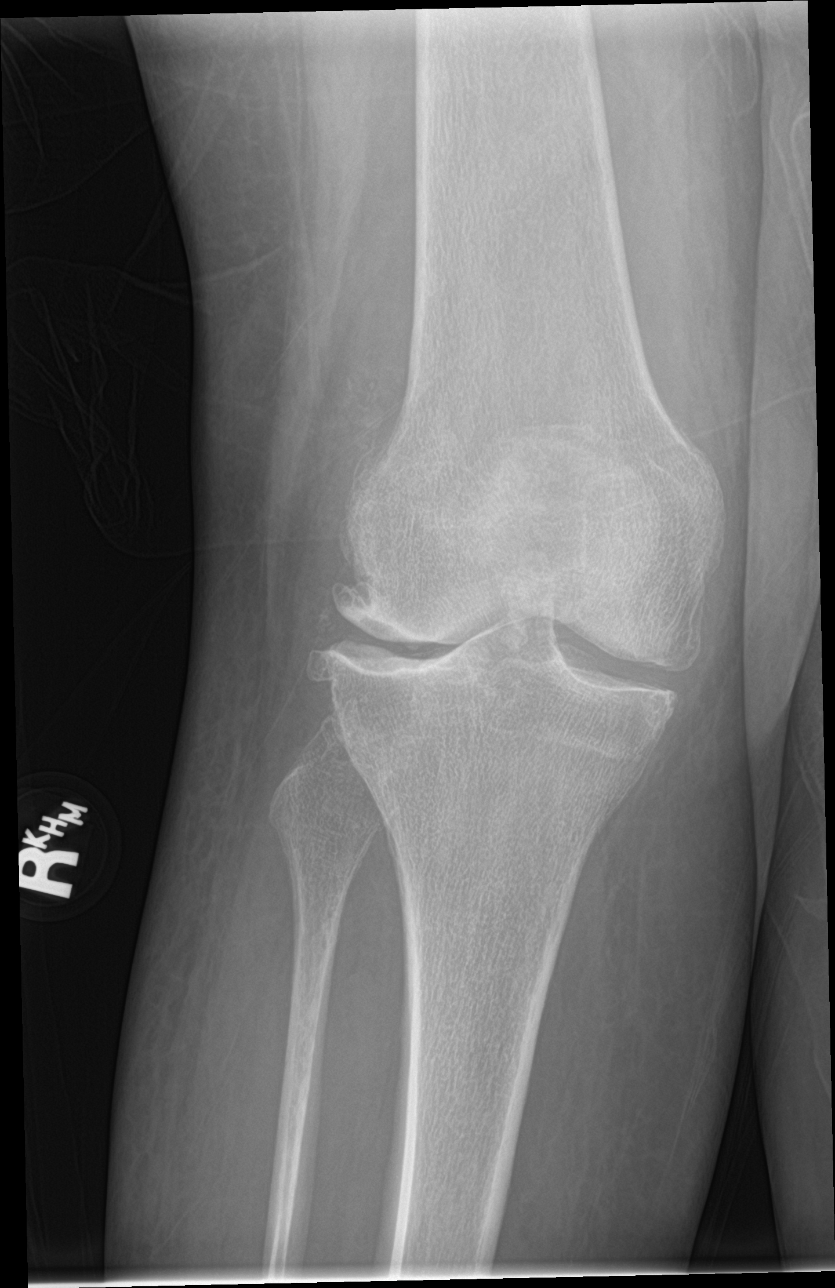

[knee sunrise]
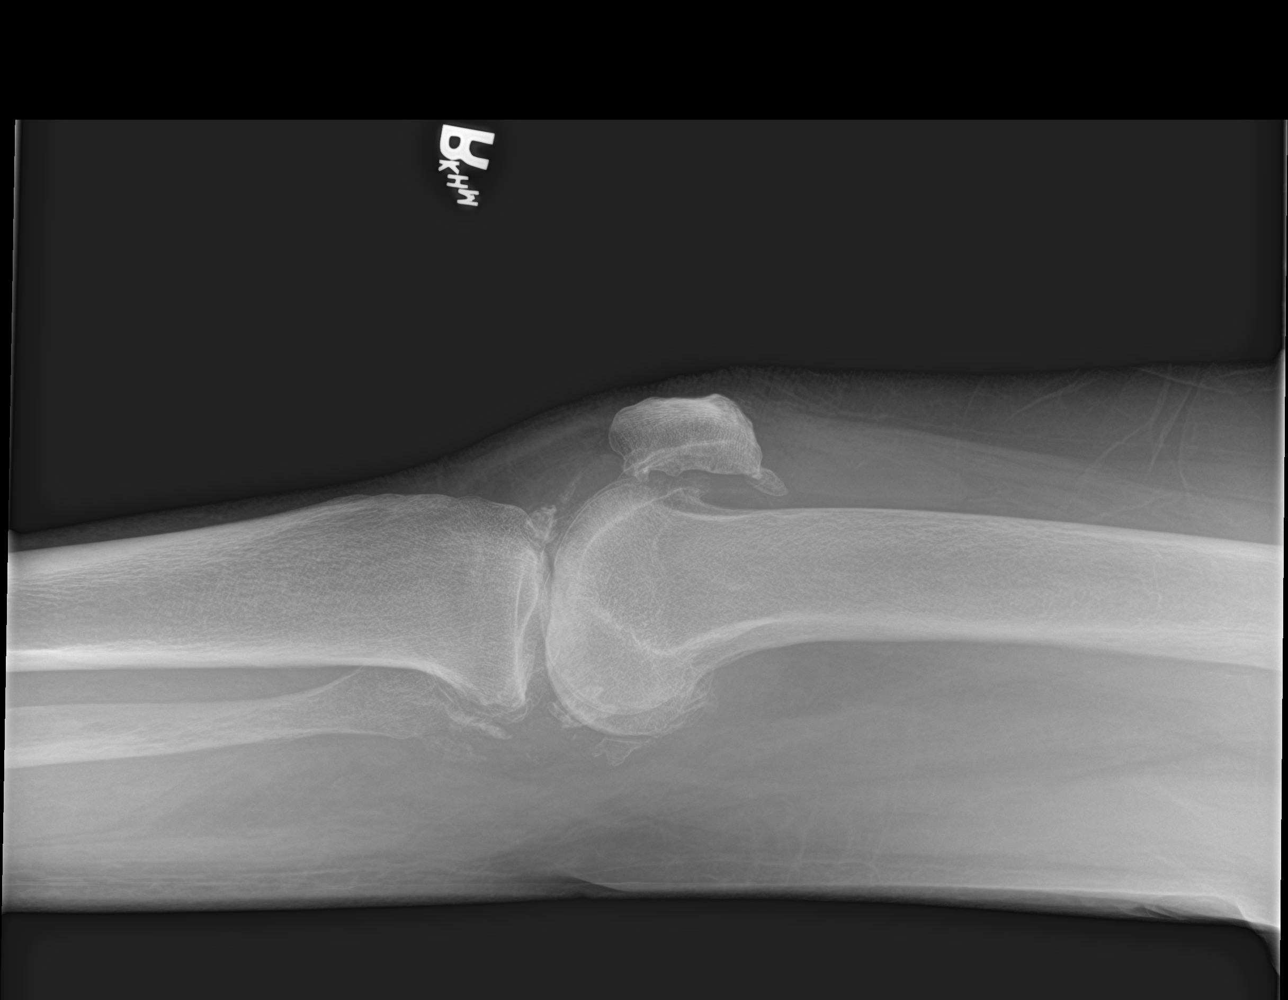

[4 of 4 positions shown; findings below may reference images not displayed]

FINDINGS: No evidence of fracture, dislocation, or joint effusion. There is
chondrocalcinosis. Narrowed joint space with osteophyte formation is
identified within the right knee. Soft tissues are unremarkable.
IMPRESSION: No acute fracture or dislocation.

## 2019-02-08 ENCOUNTER — Other Ambulatory Visit: Payer: Self-pay | Admitting: Cardiovascular Disease

## 2019-02-14 DIAGNOSIS — D229 Melanocytic nevi, unspecified: Secondary | ICD-10-CM | POA: Diagnosis not present

## 2019-02-14 DIAGNOSIS — M1711 Unilateral primary osteoarthritis, right knee: Secondary | ICD-10-CM | POA: Diagnosis not present

## 2019-02-14 DIAGNOSIS — L821 Other seborrheic keratosis: Secondary | ICD-10-CM | POA: Diagnosis not present

## 2019-02-14 DIAGNOSIS — M1611 Unilateral primary osteoarthritis, right hip: Secondary | ICD-10-CM | POA: Diagnosis not present

## 2019-02-14 DIAGNOSIS — D485 Neoplasm of uncertain behavior of skin: Secondary | ICD-10-CM | POA: Diagnosis not present

## 2019-02-14 DIAGNOSIS — H3530 Unspecified macular degeneration: Secondary | ICD-10-CM | POA: Diagnosis not present

## 2019-02-14 DIAGNOSIS — R208 Other disturbances of skin sensation: Secondary | ICD-10-CM | POA: Diagnosis not present

## 2019-02-14 DIAGNOSIS — I4891 Unspecified atrial fibrillation: Secondary | ICD-10-CM | POA: Diagnosis not present

## 2019-02-21 ENCOUNTER — Other Ambulatory Visit: Payer: Self-pay | Admitting: Cardiovascular Disease

## 2019-02-22 ENCOUNTER — Ambulatory Visit: Payer: Medicare Other | Admitting: Cardiovascular Disease

## 2019-02-24 NOTE — Progress Notes (Signed)
Remote pacemaker transmission.   

## 2019-03-09 ENCOUNTER — Telehealth: Payer: Self-pay | Admitting: Cardiovascular Disease

## 2019-03-09 NOTE — Telephone Encounter (Signed)
Pt stated that her PCP took her off a couple medications that she says Dr Claiborne Billings prescribed her and she has been concerned and having side effects. She stopped her Lexapro and Duloxetine and can not sleep. She stated they changed her Oxybutinine and she is having incontinent issues. Advised pt to call PCP about issues because we do not prescribe those medications. She wanted Dr Claiborne Billings to know because she says he prescribed the meds. Again, advised pt to call PCP. Will route to Dr Claiborne Billings for review.

## 2019-03-09 NOTE — Telephone Encounter (Signed)
Patient would like to speak to nurse about some things that have been going with her the past two-three weeks.

## 2019-03-10 ENCOUNTER — Telehealth: Payer: Self-pay | Admitting: Cardiovascular Disease

## 2019-03-10 NOTE — Telephone Encounter (Signed)
Follow Up:   Pt said she called yesterday and was waiting to see what Dr Claiborne Billings said. She said she have been  Having episodes with her heart. She said she needs to talk to somebody today,, because she lives by herself.

## 2019-03-10 NOTE — Telephone Encounter (Signed)
Pt states she can feel her heart, especially at night and when resting. She has had many medication changes from her PCP and they are not returning her calls. She is worried that he made all these changes and it if effecting her a.fib. she stated she went back to taking her medications as she originally was (20mg  of Lexapro and 5mg  of Oxybutin) as they were working fine according to her and she didn't have all these issues. She not only could "feel her heart" but she was having horrible hot flashes and had to get up in the night and change her cloths and sheets. Pt stated "it was worse then when she was going through the change". Educated her on the changes in Lexapro to Cymbalta and how it could be how her body is adjusting to the medications and generally needs to allow 30-*45 days for her body to adjust. She stated she cannot do this for 45 days it has been 2 weeks and she is miserable. Encouraged her to keep 12/23 appt with PCP and discuss all of these concerns with them. She has been taking the Metoprolol as ordered by [name] in Oct. Without issue.  Pt appreciative for the call back and no additional questions at this time.

## 2019-03-15 DIAGNOSIS — I4891 Unspecified atrial fibrillation: Secondary | ICD-10-CM | POA: Diagnosis not present

## 2019-03-15 DIAGNOSIS — M1611 Unilateral primary osteoarthritis, right hip: Secondary | ICD-10-CM | POA: Diagnosis not present

## 2019-03-15 DIAGNOSIS — M1711 Unilateral primary osteoarthritis, right knee: Secondary | ICD-10-CM | POA: Diagnosis not present

## 2019-03-15 DIAGNOSIS — H3530 Unspecified macular degeneration: Secondary | ICD-10-CM | POA: Diagnosis not present

## 2019-03-16 NOTE — Telephone Encounter (Signed)
In conversation last week told pt would call back and check on her after PCP appt on 12/23. Called today and left message

## 2019-04-05 ENCOUNTER — Telehealth: Payer: Self-pay

## 2019-04-05 NOTE — Telephone Encounter (Signed)
Late documentation from 1/12:   Denise Pugh called in yesterday wondering if she should get the COVID vaccine. I encouraged her to get her flu vaccine first and then wait 2 weeks to get her COVID vaccine.  I have ger the [place] Dept. Phone number and told her I would call back tomorrow with number for [place] as pt did not have access to Internet.  In addition, pt stated she was started on a new medication by her PCP. Told her to contact her PCP with her symptoms. In addition, to schedule her flu vaccine She agree no additional Questions at this time.   1/13 Called patient with [facility] Dept[facility] Phone number to schedule COVID test

## 2019-04-07 DIAGNOSIS — Z23 Encounter for immunization: Secondary | ICD-10-CM | POA: Diagnosis not present

## 2019-04-21 DIAGNOSIS — M170 Bilateral primary osteoarthritis of knee: Secondary | ICD-10-CM | POA: Diagnosis not present

## 2019-04-24 ENCOUNTER — Ambulatory Visit: Payer: Medicare Other | Admitting: Medical

## 2019-04-24 NOTE — Progress Notes (Deleted)
Cardiology Office Note   Date:  04/24/2019   ID:  Denise Pugh, DOB Aug 21, 1928, MRN [id number]  PCP:  [name], MD  Cardiologist:  [name], MD EP: None  No chief complaint on file.     History of Present Illness: Denise Pugh is a 86 y.o. female with a PMH of permanent atrial fibrillation, SVT, SSS s/p PPM placement, HTN, HLD, OSA not on CPAP, hypothyroidism, and OA with chronic knee/hip/back/shoulder pain, who presents for evaluation of LE edema.  She was last evaluated by cardiology at an outpatient visit with [name] 01/13/2019, at which time her primary complaint was fatigue. She was noted to have a history of OSA, however had not been on CPAP for several years and was recommended for a repeat sleep study. Otherwise, no medication changes occurred at that time. Her last echo 05/2017 showed EF 660-65%, G3DD, no RWMA, moderate AI, mild-moderate MR, mild TR, and mild pulmHTN. Her last ischemic evaluation was a LHC in 2003 which showed essentially normal coronary arteries.       Past Medical History:  Diagnosis Date  . Arrhythmia    HX of SVT. CARDIONET MONITOR 07/15/12 TO 08/13/12  . Arthritis   . Family history of adverse reaction to anesthesia 1970s   mother  . GERD (gastroesophageal reflux disease)   . Hyperlipidemia 08/06/11   lexiscan myoview-normal. Due to severe attenuation the study specificity and sensitivty are deminished.  . Hypertension 08/25/10   ECHO-EF 60-65%  . Hypothyroidism   . Irregular heart beat   . Shortness of breath   . Sleep apnea    SLEEP STUDY-[facility]  . Thyroid disease     Past Surgical History:  Procedure Laterality Date  . CARDIAC CATHETERIZATION  07/06/01   spade-like configuration  . CARDIOVERSION N/A 06/07/2017   Procedure: CARDIOVERSION;  Surgeon: [name], MD;  Location: [facility];  Service: Cardiovascular;  Laterality: N/A;  . CARDIOVERSION N/A 04/14/2018   Procedure: CARDIOVERSION;   Surgeon: [name], MD;  Location: [facility];  Service: Cardiovascular;  Laterality: N/A;  . DILATION AND CURETTAGE OF UTERUS    . EYE SURGERY    . KNEE ARTHROSCOPY    . PERMANENT PACEMAKER INSERTION N/A 07/20/2013   Procedure: PERMANENT PACEMAKER INSERTION;  Surgeon: [name], MD;  Location: [facility];  Service: Cardiovascular;  Laterality: N/A;     Current Outpatient Medications  Medication Sig Dispense Refill  . acetaminophen (TYLENOL) 500 MG tablet Take 500 mg by mouth every 6 (six) hours as needed for moderate pain, fever or headache.     Marland Kitchen atorvastatin (LIPITOR) 20 MG tablet TAKE ONE-HALF TABLET BY  MOUTH DAILY 45 tablet 3  . Cholecalciferol (VITAMIN D) 2000 UNITS tablet Take 2,000 Units by mouth daily.    Marland Kitchen dexlansoprazole (DEXILANT) 60 MG capsule Take 60 mg by mouth daily.    Marland Kitchen docusate sodium (COLACE) 100 MG capsule Take 100 mg by mouth 2 (two) times daily.    Marland Kitchen ELIQUIS 5 MG TABS tablet TAKE 1 TABLET BY MOUTH TWO  TIMES DAILY 180 tablet 1  . escitalopram (LEXAPRO) 20 MG tablet Take 1 tablet (20 mg total) by mouth daily. 90 tablet 3  . hydrochlorothiazide (MICROZIDE) 12.5 MG capsule TAKE 1 CAPSULE BY MOUTH  DAILY 90 capsule 0  . levothyroxine (SYNTHROID) 75 MCG tablet TAKE 1 TABLET BY MOUTH  DAILY BEFORE BREAKFAST 90 tablet 3  . Liniments (DEEP BLUE RELIEF EX) Apply 1  application topically daily as needed (for back, shoulder and knee pain).    . metoprolol tartrate (LOPRESSOR) 50 MG tablet Take 2 tablets (100 mg total) by mouth every morning AND 1.5 tablets (75 mg total) every evening. 270 tablet 3  . Multiple Vitamins-Minerals (PRESERVISION AREDS 2 PO) Take 2 tablets by mouth daily.    . mupirocin ointment (BACTROBAN) 2 % Place 1 application into the nose daily as needed (for dry nose).    Marland Kitchen OVER THE COUNTER MEDICATION Place 1 drop into both ears daily. Organic Ear Oil    . oxybutynin (DITROPAN) 5 MG tablet Take 5 mg by mouth 2 (two) times daily.     . sodium  chloride (OCEAN) 0.65 % SOLN nasal spray Place 1 spray into both nostrils as needed for congestion.    . traMADol (ULTRAM) 50 MG tablet Take 100 mg by mouth 2 (two) times daily.      No current facility-administered medications for this visit.    Allergies:   Penicillins and Tetanus toxoid    Social History:  The patient  reports that she has never smoked. She has never used smokeless tobacco. She reports that she does not drink alcohol or use drugs.   Family History:  The patient's ***family history includes Diabetes in her father and mother; Heart attack in her maternal grandfather and maternal grandmother; Hypertension in her father.    ROS:  Please see the history of present illness.   Otherwise, review of systems are positive for {NONE DEFAULTED:18576::"none"}.   All other systems are reviewed and negative.    PHYSICAL EXAM: VS:  There were no vitals taken for this visit. , BMI There is no height or weight on file to calculate BMI. GEN: Well nourished, well developed, in no acute distress HEENT: normal Neck: no JVD, carotid bruits, or masses Cardiac: ***RRR; no murmurs, rubs, or gallops,no edema  Respiratory:  clear to auscultation bilaterally, normal work of breathing GI: soft, nontender, nondistended, + BS MS: no deformity or atrophy Skin: warm and dry, no rash Neuro:  Strength and sensation are intact Psych: euthymic mood, full affect   EKG:  EKG {ACTION; IS/IS GI:087931 ordered today. The ekg ordered today demonstrates ***   Recent Labs: 04/26/2018: TSH 2.030    Lipid Panel    Component Value Date/Time   CHOL 120 07/19/2013 0539   TRIG 52 07/19/2013 0539   HDL 45 07/19/2013 0539   CHOLHDL 2.7 07/19/2013 0539   VLDL 10 07/19/2013 0539   LDLCALC 65 07/19/2013 0539      Wt Readings from Last 3 Encounters:  01/13/19 172 lb 3.2 oz (78.1 kg)  11/14/18 168 lb (76.2 kg)  09/28/18 165 lb (74.8 kg)      Other studies Reviewed: Additional studies/ records  that were reviewed today include:   Echocardiogram 05/2017: - Left ventricle: The cavity size was normal. There was mild  concentric hypertrophy. Systolic function was normal. The  estimated ejection fraction was in the range of 60% to 65%. Wall  motion was normal; there were no regional wall motion  abnormalities. There was a reduced contribution of atrial  contraction to ventricular filling, due to increased ventricular  diastolic pressure or atrial contractile dysfunction. Doppler  parameters are consistent with a reversible restrictive pattern,  indicative of decreased left ventricular diastolic compliance  and/or increased left atrial pressure (grade 3 diastolic  dysfunction). Doppler parameters are consistent with high  ventricular filling pressure.  - Aortic valve: Severely calcified annulus. Trileaflet;  moderately  thickened, moderately calcified leaflets. There was moderate  regurgitation.  - Mitral valve: Calcified annulus. There was mild to moderate  regurgitation.  - Left atrium: The atrium was moderately dilated.  - Tricuspid valve: There was mild regurgitation.  - Pulmonary arteries: PA peak pressure: 38 mm Hg (S).   Impressions:   - The right ventricular systolic pressure was increased consistent  with mild pulmonary hypertension.     ASSESSMENT AND PLAN:  1.  ***   Current medicines are reviewed at length with the patient today.  The patient {ACTIONS; HAS/DOES NOT HAVE:19233} concerns regarding medicines.  The following changes have been made:  {PLAN; NO CHANGE:13088:s}  Labs/ tests ordered today include: *** No orders of the defined types were placed in this encounter.    Disposition:   FU with *** in {gen number [id number] {Days to years:10300}  Signed, Abigail Butts, PA-C  04/24/2019 8:49 AM

## 2019-05-04 ENCOUNTER — Ambulatory Visit (INDEPENDENT_AMBULATORY_CARE_PROVIDER_SITE_OTHER): Payer: Medicare Other | Admitting: *Deleted

## 2019-05-04 DIAGNOSIS — I4819 Other persistent atrial fibrillation: Secondary | ICD-10-CM | POA: Diagnosis not present

## 2019-05-04 LAB — CUP PACEART REMOTE DEVICE CHECK
Battery Remaining Longevity: 33 mo
Battery Voltage: 2.96 V
Brady Statistic AP VP Percent: 0.98 %
Brady Statistic AP VS Percent: 0.21 %
Brady Statistic AS VP Percent: 14.89 %
Brady Statistic AS VS Percent: 83.92 %
Brady Statistic RA Percent Paced: 0.72 %
Brady Statistic RV Percent Paced: 11.26 %
Date Time Interrogation Session: 20210211154257
Implantable Lead Implant Date: 20150430
Implantable Lead Implant Date: 20150430
Implantable Lead Location: 753859
Implantable Lead Location: 753860
Implantable Lead Model: 5076
Implantable Lead Model: 5076
Implantable Pulse Generator Implant Date: 20150430
Lead Channel Impedance Value: 342 Ohm
Lead Channel Impedance Value: 399 Ohm
Lead Channel Impedance Value: 456 Ohm
Lead Channel Impedance Value: 513 Ohm
Lead Channel Pacing Threshold Amplitude: 1.125 V
Lead Channel Pacing Threshold Amplitude: 1.125 V
Lead Channel Pacing Threshold Pulse Width: 0.4 ms
Lead Channel Pacing Threshold Pulse Width: 0.4 ms
Lead Channel Sensing Intrinsic Amplitude: 0.625 mV
Lead Channel Sensing Intrinsic Amplitude: 0.625 mV
Lead Channel Sensing Intrinsic Amplitude: 17 mV
Lead Channel Sensing Intrinsic Amplitude: 17 mV
Lead Channel Setting Pacing Amplitude: 2.25 V
Lead Channel Setting Pacing Amplitude: 2.5 V
Lead Channel Setting Pacing Pulse Width: 0.4 ms
Lead Channel Setting Sensing Sensitivity: 4 mV

## 2019-05-05 NOTE — Progress Notes (Signed)
PPM Remote  

## 2019-05-10 ENCOUNTER — Encounter: Payer: Self-pay | Admitting: Cardiovascular Disease

## 2019-05-10 NOTE — Telephone Encounter (Deleted)
We are recommending the COVID-19 vaccine to all of our patients. Cardiac medications (including blood thinners) should not deter anyone from being vaccinated and there is no need to hold any of those medications prior to vaccine administration.     Currently, there is a hotline to call (active 03/31/19) to schedule vaccination appointments as no walk-ins will be accepted.   Number: [phone].    If an appointment is not available please go to [email or web address] to sign up for notification when additional vaccine appointments are available.   If you have further questions or concerns about the vaccine process, please visit [email or web address] or contact your primary care physician.   

## 2019-05-10 NOTE — Telephone Encounter (Signed)
error 

## 2019-05-11 ENCOUNTER — Encounter: Payer: Self-pay | Admitting: Cardiovascular Disease

## 2019-05-11 ENCOUNTER — Telehealth (INDEPENDENT_AMBULATORY_CARE_PROVIDER_SITE_OTHER): Payer: Medicare Other | Admitting: Cardiovascular Disease

## 2019-05-11 VITALS — Temp 98.8°F | Ht 63.0 in | Wt 160.0 lb

## 2019-05-11 DIAGNOSIS — Z7901 Long term (current) use of anticoagulants: Secondary | ICD-10-CM | POA: Diagnosis not present

## 2019-05-11 DIAGNOSIS — M7989 Other specified soft tissue disorders: Secondary | ICD-10-CM

## 2019-05-11 DIAGNOSIS — I517 Cardiomegaly: Secondary | ICD-10-CM

## 2019-05-11 DIAGNOSIS — G4733 Obstructive sleep apnea (adult) (pediatric): Secondary | ICD-10-CM | POA: Diagnosis not present

## 2019-05-11 DIAGNOSIS — I4821 Permanent atrial fibrillation: Secondary | ICD-10-CM | POA: Diagnosis not present

## 2019-05-11 DIAGNOSIS — M25473 Effusion, unspecified ankle: Secondary | ICD-10-CM

## 2019-05-11 DIAGNOSIS — Z950 Presence of cardiac pacemaker: Secondary | ICD-10-CM

## 2019-05-11 DIAGNOSIS — I495 Sick sinus syndrome: Secondary | ICD-10-CM

## 2019-05-11 MED ORDER — METOPROLOL TARTRATE 100 MG PO TABS: ORAL_TABLET | ORAL | 3 refills | Status: DC

## 2019-05-11 NOTE — Patient Instructions (Addendum)
Medication Instructions:  INCREASE METOPROLOL to 100MG  twice a day (1 tablet twice a day) *If you need a refill on your cardiac medications before your next appointment, please call your pharmacy*  Follow-Up: At [facility], you and your health needs are our priority.  As part of our continuing mission to provide you with exceptional heart care, we have created designated Provider Care Teams.  These Care Teams include your primary Cardiologist (physician) and Advanced Practice Providers (APPs -  Physician Assistants and Nurse Practitioners) who all work together to provide you with the care you need, when you need it.  Your next appointment:   3 MONTHS  The format for your next appointment:   IN OFFICE  Provider:   DR. [name]  Other Instructions YOU MAY TAKE AN EXTRA 12.5MG  OF  YOUR HCTZ FOR SWELLING IF NEEDED

## 2019-05-11 NOTE — Progress Notes (Signed)
Virtual Visit via Telephone Note   This visit type was conducted due to national recommendations for restrictions regarding the COVID-19 Pandemic (e.g. social distancing) in an effort to limit this patient's exposure and mitigate transmission in our community.  Due to her co-morbid illnesses, this patient is at least at moderate risk for complications without adequate follow up.  This format is felt to be most appropriate for this patient at this time.  The patient did not have access to video technology/had technical difficulties with video requiring transitioning to audio format only (telephone).  All issues noted in this document were discussed and addressed.  No physical exam could be performed with this format.  Please refer to the patient's chart for her  consent to telehealth for [facility].   Date:  05/11/2019   ID:  Denise Pugh, DOB [birth year], MRN [id number]  Patient Location: Home Provider Location: Home  PCP:  [name], MD  Cardiologist:  [name], MD  Electrophysiologist:  None   Evaluation Performed:  Follow-Up Visit  Chief Complaint: 81-month follow-up evaluation  History of Present Illness:    Denise Pugh is a 86 y.o. female who has a history of SVTand moderate left ventricular hypertrophy with documented "spade-like ventricle."  She has previously documented diffuse T-wave abnormalities. A stress test in May 2013 showed breast attenuation artifact with a post stress ejection fraction of 70% without evidence for scar or ischemia. She had experienced recurrent episodes of palpitations. A cardiac monitor revealed episodes of SVT up to 185 beats per minute and her beta blocker dose was increased.  A CardioNet monitor on her increased dose of Toprol-XL 25 mg still revealed bursts of atrial tachycardia/A flutter rate at 189 beats per minute.  On  May 17 she had an episode of tachycardia palpitations and did also have episodes of bradycardia; some of her spells  suggested SVT at 150  in addition to episode of accelerated idioventricular rhythm when she was sinus bradycardic.  A 2-D echo Doppler study on 09/15/2012 showed an ejection fraction of 0000000 1 diastolic dysfunction and elevated LV filling pressures.  The aortic valve was as sclerotic without stenosis and there was mild/moderate central regurgitation. 10 mild mitral regurgitation and mild tricuspid regurgitation with mild pulmonary hypertension with a PA estimate pressure at 47 mm.  In April 2015, she was admitted to [facility] with atrial fibrillation with a rapid ventricular response.  She also had a CardioNet monitor which had shown episodes of sinus bradycardia, as well as PAF, with rates up to 170 beats per minute with also questionable nonsustained VT versus actual fibrillation with aberrancy.  She was started on amiodarone. Her Eliquis was held, and she underwent permanent pacemaker insertion.  She was seen in August 2015 by Dr. Sallyanne Kuster for pacemaker follow-up.  Her underlying rhythm was sinus bradycardia at 38 bpm, and she was pacing the atrium greater than 99% of the time without hardly any episodes of ventricular pacing.  She recently saw Dr. Sallyanne Kuster for one-year evaluation in October 2016.  She has both sinus node dysfunction and AV conduction abnormalities.  Her pacemaker is programmed with MVP on and she has a very long AV conduction time so as to prevent ventricular pacing.  She underwent a pacemaker evaluation with Dr. Sallyanne Kuster was in October 2017.   She has been undergoing 3 month interval device checks.  She was atrially paced and ventricular sensing with a long AV delay.  Estimated longevity is 6.5 years.  There is not been any detected atrial fibrillation or ventricular tachycardia.   She has had difficulty with low back discomfort making it difficult to walk.  She has been undergoing physical therapy.  She also received injections and also had acupuncture.  She admits  to purposeful weight loss over the last year and has lost approximately 20 pounds.  She denies any episodes of chest pain.  There have been no episodes of recurrent atrial fibrillation.  She continues to be on amiodarone 200 mg daily and takes eliquis 5 mg twice a day for anticoagulation.  She continues to be on losartan HCT and metoprolol for hypertension.    She was seen by Dr. Sallyanne Kuster early March 2019.  Pacemaker interrogation showed uninterrupted atrial fibrillation since late December 2018.  Rate control was good averaging in the 80s.  She had noticed more shortness of breath and was symptomatic with AF and had been compliant with anticoagulation she underwent cardioversion on June 07, 2017 successfully.  Postprocedure ECG showed atrially paced and ventricular sensed rhythm.  She has not noticed any major change in her symptoms following the cardioversion.  She denies chest pain.  She denies palpitations.  She has been maintained on amiodarone 400 mg daily in addition to metoprolol 50 mg twice a day.  She is on Eliquis 5 mg twice a day.  She continues with atorvastatin for hyperlipidemia.  She has had some issues with osteoarthritis.    I saw her in June 23, 2017 at which time she was atrially paced prolonged AV conduction.  I recommended that she reduce her amiodarone to 300 mg daily.  Subsequently, amiodarone dose was reduced to 200 mg daily  I saw her in June 2019 at which time she was maintaining sinus rhythm.  She was having some mild lower extremity edema and her blood pressure was elevated; HCTZ 12.5 mg added to her regimen.  I saw her in December 2019 at which time she was back in atrial fibrillation.  She was on amiodarone 200 mg daily, Eliquis 5 mg twice a day, metoprolol 50 mg twice a day in addition to HCTZ.  I recommended short-term increase in amiodarone back to 200 mg twice a day.  She  had follow-up evaluations with Dr. Sallyanne Kuster, Jory Sims, NP,  andDr. Rayann Heman.  She is now  considered permanent atrial fibrillation.  There was some discussion about possible sideration for ablation which she had seen Dr. Rayann Heman who did not recommend this approach.    I last saw her in October 2020.  Her atrial fibrillation rate was controlled.  She was having issues significant fatigability and continues to be tired.  Of note, remotely she had been diagnosed with sleep apnea over 10 years ago and was on CPAP therapy until her husband became ill 2013 since she had a take care of him all the time.  She has not been on CPAP therapy for at least 7 years.  She was going to bed between 11 PM and midnight and typically wakes up at noon.  She does snore.  When she wakes up she is still very tired.  She has recently seen Dr. Wende Neighbors who checked laboratory and she was told that her labs were stable.  The patient was taken off amiodarone by Dr. Sallyanne Kuster and she has been on an increase metoprolol regimen at 75 mg twice a day.  During that evaluation, her atrial fibrillation rate was increased at 113 despite taking metoprolol 75 mg twice a day.  I recommended slight titration of her a.m. dose to 100 mg  and that she continue the 75 mg evening dose.  She was on levothyroxine for hypothyroidism with laboratory being followed by Dr. [name].  I also discussed her untreated sleep apnea as a contributor to her fatigability.  Presently, she still is aware that her heart rate is increased.  She denies any episodes of chest pain.  She admits to noticing more swelling in her legs.  She denies significant dietary change with reference to sodium intake.  She denies chest pain.  She does have issues with arthritis.  She admitted being more fatigued today but had the Covid vaccination yesterday.  She presents for reevaluation.  The patient does not have symptoms concerning for COVID-19 infection (fever, chills, cough, or new shortness of breath).    Past Medical History:  Diagnosis Date  . Arrhythmia    HX of SVT.  CARDIONET MONITOR 07/15/12 TO 08/13/12  . Arthritis   . Family history of adverse reaction to anesthesia 1970s   mother  . GERD (gastroesophageal reflux disease)   . Hyperlipidemia 08/06/11   lexiscan myoview-normal. Due to severe attenuation the study specificity and sensitivty are deminished.  . Hypertension 08/25/10   ECHO-EF 60-65%  . Hypothyroidism   . Irregular heart beat   . Shortness of breath   . Sleep apnea    SLEEP STUDY-[facility]  . Thyroid disease    Past Surgical History:  Procedure Laterality Date  . CARDIAC CATHETERIZATION  07/06/01   spade-like configuration  . CARDIOVERSION N/A 06/07/2017   Procedure: CARDIOVERSION;  Surgeon: [name], MD;  Location: [facility];  Service: Cardiovascular;  Laterality: N/A;  . CARDIOVERSION N/A 04/14/2018   Procedure: CARDIOVERSION;  Surgeon: [name], MD;  Location: [facility];  Service: Cardiovascular;  Laterality: N/A;  . DILATION AND CURETTAGE OF UTERUS    . EYE SURGERY    . KNEE ARTHROSCOPY    . PERMANENT PACEMAKER INSERTION N/A 07/20/2013   Procedure: PERMANENT PACEMAKER INSERTION;  Surgeon: [name], MD;  Location: [facility];  Service: Cardiovascular;  Laterality: N/A;     Current Meds  Medication Sig  . acetaminophen (TYLENOL) 500 MG tablet Take 500 mg by mouth every 6 (six) hours as needed for moderate pain, fever or headache.   Marland Kitchen atorvastatin (LIPITOR) 20 MG tablet TAKE ONE-HALF TABLET BY  MOUTH DAILY  . Cholecalciferol (VITAMIN D) 2000 UNITS tablet Take 2,000 Units by mouth daily.  Marland Kitchen dexlansoprazole (DEXILANT) 60 MG capsule Take 60 mg by mouth daily.  . diclofenac Sodium (VOLTAREN) 1 % GEL Apply topically as needed. 1% gel as needed for pain  . docusate sodium (COLACE) 100 MG capsule Take 100 mg by mouth 2 (two) times daily.  Marland Kitchen ELIQUIS 5 MG TABS tablet TAKE 1 TABLET BY MOUTH TWO  TIMES DAILY  . escitalopram (LEXAPRO) 20 MG tablet Take 1 tablet (20 mg total) by mouth daily.    . hydrochlorothiazide (MICROZIDE) 12.5 MG capsule TAKE 1 CAPSULE BY MOUTH  DAILY  . levothyroxine (SYNTHROID) 75 MCG tablet TAKE 1 TABLET BY MOUTH  DAILY BEFORE BREAKFAST  . Liniments (DEEP BLUE RELIEF EX) Apply 1 application topically daily as needed (for back, shoulder and knee pain).  . metoprolol tartrate (LOPRESSOR) 50 MG tablet Take 2 tablets (100 mg total) by mouth every morning AND 1.5 tablets (75 mg total) every evening.  . mupirocin ointment (BACTROBAN) 2 % Place 1 application into the nose daily as  needed (for dry nose).  Marland Kitchen OVER THE COUNTER MEDICATION Place 1 drop into both ears daily. Organic Ear Oil  . oxybutynin (DITROPAN) 5 MG tablet Take 5 mg by mouth 2 (two) times daily.   . sodium chloride (OCEAN) 0.65 % SOLN nasal spray Place 1 spray into both nostrils as needed for congestion.  . traMADol (ULTRAM) 50 MG tablet Take 100 mg by mouth 2 (two) times daily.      Allergies:   Penicillins and Tetanus toxoid   Social History   Tobacco Use  . Smoking status: Never Smoker  . Smokeless tobacco: Never Used  Substance Use Topics  . Alcohol use: No    Alcohol/week: 0.0 standard drinks  . Drug use: No     Family Hx: The patient's family history includes Diabetes in her father and mother; Heart attack in her maternal grandfather and maternal grandmother; Hypertension in her father.  ROS:   Please see the history of present illness.    Positive for fatigue Positive for increased heart rate Positive for leg swelling No fevers chills night sweats No anginal symptoms Positive for arthritis involving her knees backs hips and shoulders Sleeping adequately All other systems reviewed and are negative.   Prior CV studies:   The following studies were reviewed today:  I reviewed her most recent remote pacemaker monitoring from May 04, 2019:  There was normal device function.  She is in permanent atrial fibrillation.  There was one episode which may have appeared to be in SVT  but was atrial fibrillation with RVR.  Rates are typically elevated and can range from 100 to 150 bpm.  Labs/Other Tests and Data Reviewed:    EKG:  An ECG dated January 13, 2019 was personally reviewed today and demonstrated:  Atrial fibrillation at 113 bpm, incomplete right bundle branch block, nonspecific ST-T changes.  Recent Labs: No results found for requested labs within last 8760 hours.   Recent Lipid Panel Lab Results  Component Value Date/Time   CHOL 120 07/19/2013 05:39 AM   TRIG 52 07/19/2013 05:39 AM   HDL 45 07/19/2013 05:39 AM   CHOLHDL 2.7 07/19/2013 05:39 AM   LDLCALC 65 07/19/2013 05:39 AM    Wt Readings from Last 3 Encounters:  05/11/19 160 lb (72.6 kg)  01/13/19 172 lb 3.2 oz (78.1 kg)  11/14/18 168 lb (76.2 kg)     Objective:    Vital Signs:  Temp 98.8 F (37.1 C)   Ht 5\' 3"  (1.6 m)   Wt 160 lb (72.6 kg)   BMI 28.34 kg/m    Since this was a phone telemedicine visit I could not physically examine the patient.  She did not have the capability today to have a blood pressure check. Vital signs were reviewed Breathing was normal and not labored There was no audible wheezing Heart rate was running around 100 There is no chest wall tenderness She denied abdominal wall tenderness She admitted to more swelling in her lower extremities Arthritic issues Normal affect and mood   ASSESSMENT & PLAN:    1. Permanent atrial fibrillation: Ventricular rate is increased recently.  I reviewed her most recent device check and often heart rates of still potentially greater than 100.  I will further titrate metoprolol to 100 mg twice a day.  She continues to be on Eliquis for anticoagulation. 2. Anticoagulation: Tolerating Eliquis.  No bleeding 3. Leg swelling: I recommended that she can take an additional hydrochlorothiazide 12.5 mg on a daily basis  or as needed for additional leg swelling 4. Obstructive sleep apnea: We had discussed the possible reevaluation at  prior office visit.  She believes she is sleeping adequately presently. 5. Left ventricular hypertrophy: Previous "spade like ventricle" and suggestive of apical HCM. 6. Permanent pacemaker: Followed by Dr. Sallyanne Kuster with most recent normal pacemaker function on his last evaluation  COVID-19 Education: The signs and symptoms of COVID-19 were discussed with the patient and how to seek care for testing (follow up with PCP or arrange E-visit).  The importance of social distancing was discussed today.  Time:   Today, I have spent 22 minutes with the patient with telehealth technology discussing the above problems.     Medication Adjustments/Labs and Tests Ordered: Current medicines are reviewed at length with the patient today.  Concerns regarding medicines are outlined above.   Tests Ordered: No orders of the defined types were placed in this encounter.   Medication Changes: No orders of the defined types were placed in this encounter.   Follow Up: In person office visit in 3 months  Signed, [name], MD  05/11/2019 2:01 PM    [place]

## 2019-05-22 ENCOUNTER — Other Ambulatory Visit: Payer: Self-pay | Admitting: Cardiovascular Disease

## 2019-06-05 DIAGNOSIS — E782 Mixed hyperlipidemia: Secondary | ICD-10-CM | POA: Diagnosis not present

## 2019-06-05 DIAGNOSIS — I4891 Unspecified atrial fibrillation: Secondary | ICD-10-CM | POA: Diagnosis not present

## 2019-06-05 DIAGNOSIS — M1711 Unilateral primary osteoarthritis, right knee: Secondary | ICD-10-CM | POA: Diagnosis not present

## 2019-06-05 DIAGNOSIS — M1611 Unilateral primary osteoarthritis, right hip: Secondary | ICD-10-CM | POA: Diagnosis not present

## 2019-06-12 DIAGNOSIS — E039 Hypothyroidism, unspecified: Secondary | ICD-10-CM | POA: Diagnosis not present

## 2019-06-12 DIAGNOSIS — Z0000 Encounter for general adult medical examination without abnormal findings: Secondary | ICD-10-CM | POA: Diagnosis not present

## 2019-06-12 DIAGNOSIS — R5383 Other fatigue: Secondary | ICD-10-CM | POA: Diagnosis not present

## 2019-06-12 DIAGNOSIS — I4891 Unspecified atrial fibrillation: Secondary | ICD-10-CM | POA: Diagnosis not present

## 2019-06-12 DIAGNOSIS — I482 Chronic atrial fibrillation, unspecified: Secondary | ICD-10-CM | POA: Diagnosis not present

## 2019-06-12 DIAGNOSIS — M1991 Primary osteoarthritis, unspecified site: Secondary | ICD-10-CM | POA: Diagnosis not present

## 2019-06-12 DIAGNOSIS — B379 Candidiasis, unspecified: Secondary | ICD-10-CM | POA: Diagnosis not present

## 2019-06-12 DIAGNOSIS — E559 Vitamin D deficiency, unspecified: Secondary | ICD-10-CM | POA: Diagnosis not present

## 2019-06-14 DIAGNOSIS — I4891 Unspecified atrial fibrillation: Secondary | ICD-10-CM | POA: Diagnosis not present

## 2019-06-14 DIAGNOSIS — E782 Mixed hyperlipidemia: Secondary | ICD-10-CM | POA: Diagnosis not present

## 2019-06-14 DIAGNOSIS — K219 Gastro-esophageal reflux disease without esophagitis: Secondary | ICD-10-CM | POA: Diagnosis not present

## 2019-06-14 DIAGNOSIS — E559 Vitamin D deficiency, unspecified: Secondary | ICD-10-CM | POA: Diagnosis not present

## 2019-07-07 ENCOUNTER — Other Ambulatory Visit: Payer: Self-pay

## 2019-07-07 ENCOUNTER — Ambulatory Visit: Payer: Medicare Other | Admitting: Podiatry

## 2019-07-07 VITALS — Temp 97.9°F

## 2019-07-07 DIAGNOSIS — M79676 Pain in unspecified toe(s): Secondary | ICD-10-CM

## 2019-07-07 DIAGNOSIS — B351 Tinea unguium: Secondary | ICD-10-CM

## 2019-07-07 DIAGNOSIS — M79609 Pain in unspecified limb: Secondary | ICD-10-CM

## 2019-07-10 ENCOUNTER — Telehealth: Payer: Self-pay | Admitting: Cardiovascular Disease

## 2019-07-10 NOTE — Telephone Encounter (Signed)
Patient called in due to legs are red, swollen, painful to the touch and leaking fluid. Patient placed a band aid on her leg due to it oozing and when she removed it today it was saturated. Patient unable to position mirror to view leg to see if there is an open wound. Patient advised to contact primary care physician for appointment to assess her legs. No weight gain, or SOB. Patient verbalized understanding.

## 2019-07-10 NOTE — Telephone Encounter (Signed)
° °  Pt c/o swelling: STAT is pt has developed SOB within 24 hours  1) How much weight have you gained and in what time span? Did not gain weight  2) If swelling, where is the swelling located? Both legs  3) Are you currently taking a fluid pill? Yes hydrochlorothiazide (MICROZIDE) 12.5 MG capsule been taking it for 2 days   4) Are you currently SOB? No  5) Do you have a log of your daily weights (if so, list)? NA  6) Have you gained 3 pounds in a day or 5 pounds in a week? no  7) Have you traveled recently? No  Pt said she's been having issue with her legs, both of it is swelling but the right leg is worst, it is red and it hurts when she touch it, she also said there's liquid coming out of it and not sure if she has a wound, she tried to put Band-Aid and it got soaked, she put thicker bandages and it also got soaked, the water stop since yesterday evening, up until today. She said the liquid was clear and no odor  Please advise

## 2019-07-11 DIAGNOSIS — L03115 Cellulitis of right lower limb: Secondary | ICD-10-CM | POA: Diagnosis not present

## 2019-07-11 DIAGNOSIS — I739 Peripheral vascular disease, unspecified: Secondary | ICD-10-CM | POA: Diagnosis not present

## 2019-07-11 DIAGNOSIS — I4821 Permanent atrial fibrillation: Secondary | ICD-10-CM | POA: Diagnosis not present

## 2019-07-11 DIAGNOSIS — R600 Localized edema: Secondary | ICD-10-CM | POA: Diagnosis not present

## 2019-07-26 ENCOUNTER — Encounter: Payer: Self-pay | Admitting: Cardiovascular Disease

## 2019-07-26 ENCOUNTER — Other Ambulatory Visit: Payer: Self-pay

## 2019-07-26 ENCOUNTER — Ambulatory Visit: Payer: Medicare Other | Admitting: Cardiovascular Disease

## 2019-07-26 VITALS — BP 108/82 | HR 118 | Ht 63.0 in | Wt 161.8 lb

## 2019-07-26 DIAGNOSIS — Z7901 Long term (current) use of anticoagulants: Secondary | ICD-10-CM

## 2019-07-26 DIAGNOSIS — I517 Cardiomegaly: Secondary | ICD-10-CM | POA: Diagnosis not present

## 2019-07-26 DIAGNOSIS — I4821 Permanent atrial fibrillation: Secondary | ICD-10-CM

## 2019-07-26 DIAGNOSIS — E039 Hypothyroidism, unspecified: Secondary | ICD-10-CM

## 2019-07-26 DIAGNOSIS — G4733 Obstructive sleep apnea (adult) (pediatric): Secondary | ICD-10-CM

## 2019-07-26 DIAGNOSIS — Z950 Presence of cardiac pacemaker: Secondary | ICD-10-CM

## 2019-07-26 MED ORDER — DIGOXIN 125 MCG PO TABS: 0.1250 mg | ORAL_TABLET | Freq: Every day | ORAL | 3 refills | Status: DC

## 2019-07-26 NOTE — Progress Notes (Signed)
Patient ID: Denise Pugh, female   DOB: Jun 08, 1928, 86 y.o.   MRN: [id number]     HPI: Denise Pugh is a 86 y.o. female who presents to the office today for a 10 month followup cardiology evaluation.  [name] has a history of SVTand moderate left ventricular hypertrophy with documented "spade-like ventricle."  She has previously documented diffuse T-wave abnormalities. A stress test in May 2013 showed breast attenuation artifact with a post stress ejection fraction of 70% without evidence for scar or ischemia. She had experienced recurrent episodes of palpitations. A cardiac monitor revealed episodes of SVT up to 185 beats per minute and her beta blocker dose was increased.  A CardioNet monitor on her increased dose of Toprol-XL 25 mg still revealed bursts of atrial tachycardia/A flutter rate at 189 beats per minute.  On  May 17 she had an episode of tachycardia palpitations and did also have episodes of bradycardia; some of her spells suggested SVT at 150  in addition to episode of accelerated idioventricular rhythm when she was sinus bradycardic.  A 2-D echo Doppler study on 09/15/2012 showed an ejection fraction of 01-60%,FUXNA 1 diastolic dysfunction and elevated LV filling pressures.  The aortic valve was as sclerotic without stenosis and there was mild/moderate central regurgitation. 10 mild mitral regurgitation and mild tricuspid regurgitation with mild pulmonary hypertension with a PA estimate pressure at 47 mm.  In April 2015, she was admitted to [facility] hospital with atrial fibrillation with a rapid ventricular response.  She also had a CardioNet monitor which had shown episodes of sinus bradycardia, as well as PAF, with rates up to 170 beats per minute with also questionable nonsustained VT versus actual fibrillation with aberrancy.  She was started on amiodarone. Her Eliquis was held, and she underwent permanent pacemaker insertion.  She was seen in August 2015 by Dr. Sallyanne Pugh for  pacemaker follow-up.  Her underlying rhythm was sinus bradycardia at 38 bpm, and she was pacing the atrium greater than 99% of the time without hardly any episodes of ventricular pacing.  She recently saw Dr. Sallyanne Pugh for one-year evaluation in October 2016.  She has both sinus node dysfunction and AV conduction abnormalities.  Her pacemaker is programmed with MVP on and she has a very long AV conduction time so as to prevent ventricular pacing.  She underwent a pacemaker evaluation with Dr. Sallyanne Pugh was in October 2017.   She has been undergoing 3 month interval device checks.  She was atrially paced and ventricular sensing with a long AV delay.  Estimated longevity is 6.5 years.  There is not been any detected atrial fibrillation or ventricular tachycardia.   She has had difficulty with low back discomfort making it difficult to walk.  She has been undergoing physical therapy.  She also received injections and also had acupuncture.  She admits to purposeful weight loss over the last year and has lost approximately 20 pounds.  She denies any episodes of chest pain.  There have been no episodes of recurrent atrial fibrillation.  She continues to be on amiodarone 200 mg daily and takes eliquis 5 mg twice a day for anticoagulation.  She continues to be on losartan HCT and metoprolol for hypertension.    She was seen by Dr. Sallyanne Pugh early March 2019.  Pacemaker interrogation showed uninterrupted atrial fibrillation since late December 2018.  Rate control was good averaging in the 80s.  She had noticed more shortness of breath and was symptomatic with AF and had  been compliant with anticoagulation she underwent cardioversion on June 07, 2017 successfully.  Postprocedure ECG showed atrially paced and ventricular sensed rhythm.  She has not noticed any major change in her symptoms following the cardioversion.  She denies chest pain.  She denies palpitations.  She has been maintained on amiodarone 400 mg daily in  addition to metoprolol 50 mg twice a day.  She is on Eliquis 5 mg twice a day.  She continues with atorvastatin for hyperlipidemia.  She has had some issues with osteoarthritis.    I saw her in June 23, 2017 at which time she was atrially paced prolonged AV conduction.  I recommended that she reduce her amiodarone to 300 mg daily.  Subsequently, amiodarone dose was reduced to 200 mg daily  I  saw her in June 2019 at which time she was maintaining sinus rhythm.  She was having some mild lower extremity edema and her blood pressure was elevated; HCTZ 12.5 mg added to her regimen.  I last saw her in December 2019 at which time she was back in atrial fibrillation.  She was on amiodarone 200 mg daily, Eliquis 5 mg twice a day, metoprolol 50 mg twice a day in addition to HCTZ.  I recommended short-term increase in amiodarone back to 200 mg twice a day.  Since I have seen her, she has had follow-up evaluations with Dr. Sallyanne Pugh, Denise Pugh, Denise Pugh,  andDr. Rayann Pugh.  She is now considered permanent atrial fibrillation.  There was some discussion about possible consideration for ablation which she had seen Dr. Rayann Pugh who did not recommend this approach.    I saw her in October 2020.  Her atrial fibrillation rate was controlled.  She was having issues significant fatigability and continues to be tired. Of note, remotely she had been diagnosed with sleep apnea over 10 years ago and was on CPAP therapy until her husband became ill 2013 since she had a take care of him all the time. She has not been on CPAP therapy for at least 7 years. She was going to bed between 11 PM and midnight and typically wakes up at noon. She does snore. When she wakes up she is still very tired. She has recently seen Dr. Wende Pugh who checked laboratory and she was told that her labs were stable. The patient was taken off amiodarone by Dr. Sallyanne Pugh and she has been on an increase metoprolol regimen at 75 mg twice a day.  During that  evaluation, her atrial fibrillation rate was increased at 113 despite taking metoprolol 75 mg twice a day.  I recommended slight titration of her a.m. dose to 100 mg  and that she continue the 75 mg evening dose.  She was on levothyroxine for hypothyroidism with laboratory being followed by Dr. [name].  I also discussed her untreated sleep apnea as a contributor to her fatigability.  She was last evaluated by me in a telemedicine visit on May 11, 2019.  At that time she was still aware that her heart rate is increased.  She denied any episodes of chest pain.  She admits to noticing more swelling in her legs.  She denies significant dietary change with reference to sodium intake.  She denies chest pain.  She does have issues with arthritis.  She admitted being more fatigued today but had the Covid vaccination the day prior to her evaluation.  With her increased heart rate I recommended further titration of metoprolol to 100 mg twice a day.  She was tolerating Eliquis for anticoagulation.  She was having intermittent leg swelling I suggested she can take an extra 12.5 mg HCTZ on an as-needed basis.    Over the past several months, Denise Pugh has continued to have problems with no energy.  Her heart rate has remained elevated.  She sleeps poorly.  She is waking up at least 3-4 times per night for urination.  Her sleep is nonrestorative.  She believes she is snoring.  She continues to have daytime sleepiness.  She underwent laboratory by Denise Pugh on June 12, 2019.  Hemoglobin and hematocrit were stable at 14.6/46.3.  BUN 12 creatinine 0.79.  LFTs were normal.  Lipid studies were excellent with total cholesterol 102.  LDL cholesterol 47.  Vitamin D level was 71.5.  TSH on her current dose of levothyroxine was normal at 3.07.  She presents for follow-up evaluation.  Past Medical History:  Diagnosis Date  . Arrhythmia    HX of SVT. CARDIONET MONITOR 07/15/12 TO 08/13/12  . Arthritis   . Family history of  adverse reaction to anesthesia 1970s   mother  . GERD (gastroesophageal reflux disease)   . Hyperlipidemia 08/06/11   lexiscan myoview-normal. Due to severe attenuation the study specificity and sensitivty are deminished.  . Hypertension 08/25/10   ECHO-EF 60-65%  . Hypothyroidism   . Irregular heart beat   . Shortness of breath   . Sleep apnea    SLEEP STUDY-[facility]  . Thyroid disease     Past Surgical History:  Procedure Laterality Date  . CARDIAC CATHETERIZATION  07/06/01   spade-like configuration  . CARDIOVERSION N/A 06/07/2017   Procedure: CARDIOVERSION;  Surgeon: [name], MD;  Location: [place] [facility];  Service: Cardiovascular;  Laterality: N/A;  . CARDIOVERSION N/A 04/14/2018   Procedure: CARDIOVERSION;  Surgeon: [name], MD;  Location: [facility];  Service: Cardiovascular;  Laterality: N/A;  . DILATION AND CURETTAGE OF UTERUS    . EYE SURGERY    . KNEE ARTHROSCOPY    . PERMANENT PACEMAKER INSERTION N/A 07/20/2013   Procedure: PERMANENT PACEMAKER INSERTION;  Surgeon: [name], MD;  Location: [place] CATH LAB;  Service: Cardiovascular;  Laterality: N/A;    Allergies  Allergen Reactions  . Penicillins Other (See Comments)    Has patient had a PCN reaction causing immediate rash, facial/tongue/throat swelling, SOB or lightheadedness with hypotension: Unknown Has patient had a PCN reaction causing severe rash involving mucus membranes or skin necrosis: Unknown Has patient had a PCN reaction that required hospitalization: Unknown Has patient had a PCN reaction occurring within the last 10 years: Unknown If all of the above answers are "NO", then may proceed with Cephalosporin use.   . Tetanus Toxoid Swelling    Current Outpatient Medications  Medication Sig Dispense Refill  . acetaminophen (TYLENOL) 500 MG tablet Take 500 mg by mouth every 6 (six) hours as needed for moderate pain, fever or headache.     Marland Kitchen atorvastatin (LIPITOR) 20  MG tablet TAKE ONE-HALF TABLET BY  MOUTH DAILY 45 tablet 3  . Cholecalciferol (VITAMIN D) 2000 UNITS tablet Take 2,000 Units by mouth daily.    Marland Kitchen dexlansoprazole (DEXILANT) 60 MG capsule Take 60 mg by mouth daily.    . diclofenac Sodium (VOLTAREN) 1 % GEL Apply topically as needed. 1% gel as needed for pain    . docusate sodium (COLACE) 100 MG capsule Take 100 mg by mouth 2 (two) times daily.    Marland Kitchen ELIQUIS 5 MG  TABS tablet TAKE 1 TABLET BY MOUTH TWO  TIMES DAILY 180 tablet 1  . escitalopram (LEXAPRO) 20 MG tablet Take 1 tablet (20 mg total) by mouth daily. 90 tablet 3  . hydrochlorothiazide (MICROZIDE) 12.5 MG capsule TAKE 1 CAPSULE BY MOUTH  DAILY 90 capsule 3  . levothyroxine (SYNTHROID) 75 MCG tablet TAKE 1 TABLET BY MOUTH  DAILY BEFORE BREAKFAST 90 tablet 3  . Liniments (DEEP BLUE RELIEF EX) Apply 1 application topically daily as needed (for back, shoulder and knee pain).    . metoprolol tartrate (LOPRESSOR) 100 MG tablet Take 1 tablet (100 mg total) by mouth every morning AND 1 tablet (100 mg total) every evening. 180 tablet 3  . Multiple Vitamins-Minerals (PRESERVISION AREDS 2 PO) Take 2 tablets by mouth daily.    . mupirocin ointment (BACTROBAN) 2 % Place 1 application into the nose daily as needed (for dry nose).    Marland Kitchen OVER THE COUNTER MEDICATION Place 1 drop into both ears daily. Organic Ear Oil    . oxybutynin (DITROPAN) 5 MG tablet Take 5 mg by mouth 2 (two) times daily.     . sodium chloride (OCEAN) 0.65 % SOLN nasal spray Place 1 spray into both nostrils as needed for congestion.    . traMADol (ULTRAM) 50 MG tablet Take 100 mg by mouth 2 (two) times daily.     . digoxin (LANOXIN) 0.125 MG tablet Take 1 tablet (0.125 mg total) by mouth daily. 90 tablet 3   No current facility-administered medications for this visit.   Social history is notable in that she is now. Her husband did suffer a CVA and has been having more difficulty at home. She has one child, 2 step sons, one grandchild and  2 great-grandchildren. There is no tobacco or alcohol use. She does admit to frequent anxiety.  ROS General: Negative; No fevers, chills, or night sweats; positive for fatigue HEENT: Negative; No changes in vision or hearing, sinus congestion, difficulty swallowing Pulmonary: Negative; No cough, wheezing, shortness of breath, hemoptysis Cardiovascular: see HPI GI: Negative; No nausea, vomiting, diarrhea, or abdominal pain GU: Negative; No dysuria, hematuria, or difficulty voiding Musculoskeletal: Positive for low back pain and osteoarthritis Hematologic/Oncology: Negative; no easy bruising, bleeding Endocrine: Negative; no heat/cold intolerance; no diabetes Neuro: Negative; no changes in balance, headaches Skin: Negative; No rashes or skin lesions Psychiatric: Negative; No behavioral problems, depression Sleep: Positive for snoring, fatigue, daytime sleepiness, remote history of sleep apnea and previously used CPAP daytime sleepiness, hypersomnolence, bruxism, restless legs, hypnogognic hallucinations, no cataplexy Other comprehensive 14 point system review is negative.   PE BP 108/82   Pulse (!) 118   Ht 5' 3" (1.6 m)   Wt 161 lb 12.8 oz (73.4 kg)   SpO2 98%   BMI 28.66 kg/m    Repeat blood pressure by me was 110/78.  Wt Readings from Last 3 Encounters:  07/26/19 161 lb 12.8 oz (73.4 kg)  05/11/19 160 lb (72.6 kg)  01/13/19 172 lb 3.2 oz (78.1 kg)  2016: Weight 181 pounds   General: Alert, oriented, no distress.  Skin: normal turgor, no rashes, warm and dry HEENT: Normocephalic, atraumatic. Pupils equal round and reactive to light; sclera anicteric; extraocular muscles intact; Nose without nasal septal hypertrophy Mouth/Parynx benign; Mallinpatti scale 3 Neck: No JVD, no carotid bruits; normal carotid upstroke Lungs: clear to ausculatation and percussion; no wheezing or rales Chest wall: without tenderness to palpitation Heart: PMI not displaced, irregularly irregular and  tachycardic at 118 bpm,  s1 s2 normal, 1/6 systolic murmur, no diastolic murmur, no rubs, gallops, thrills, or heaves Abdomen: soft, nontender; no hepatosplenomehaly, BS+; abdominal aorta nontender and not dilated by palpation. Back: no CVA tenderness Pulses 2+ Musculoskeletal: full range of motion, normal strength, no joint deformities Extremities: no clubbing cyanosis or edema, Homan's sign negative  Neurologic: grossly nonfocal; Cranial nerves grossly wnl Psychologic: Normal mood and affect  ECG (independently read by me): Atrial fibrillation with a ventricular rate of 118 bpm.  Incomplete right bundle branch block.  Previously noted ST wave abnormality laterally.  October 2020 ECG (independently read by me): Atrial fibrillation at 113; IRBBB, NSSTT changes  December 2019 ECG (independently read by me): Atrial fibrillation at 83 bpm with  nonspecific ST-T changes . The last 3 beats of the ECG show ventricular pacing and 1 beat with AV pacing   September 14, 2017 ECG (independently read by me): Atrially paced rhythm at 64 bpm.  Prolonged AV conduction with PR interval of _0 ms.  Inferolateral ST wave abnormality.  TC interval 443 ms.  June 23, 2017 ECG (independently read by me): Atrially paced with prolonged AV conduction with a PR interval at 276.  Ventricular rate 63.  Anterolateral T wave changes  October 2018 ECG (independently read by me): Atrially paced rhythm at 69 bpm.  Prolonged AV conduction at 298 ms. ST-T abnormality anterolaterally.  January 2018 ECG (independently read by me): Atrial paced rhythm at 68 bpm.  Prolonged AV conduction at 300 ms. ,  Incomplete right bundle-branch block Anterolateral ST-T changes  April 2017 ECG (independently read by me): Sinus rhythm with markedly prolonged AV interval at ~ 440 msec.  Right bundle branch block  November 2016 ECG (independently read by me): 100% atrial pacing with a markedly prolonged PR interval at 480 ms.  Ventricular sensing.   Incomplete right bundle branch block.  ECG (independently read by me): Atrial paced rhythm at 86 bpm with prolonged PR segment.  T-wave inversion  December 2015 ECG (independently read by me): Atrially paced rhythm with ventricular sensing with incomplete right bundle branch block.  Previously noted T-wave abnormalities  07/24/2013 ECG: Atrial lead paced rhythm.  Mild RV conduction delay.  Diffuse T-wave inversion has been present previously  Prior ECG today  (independently read by me): Bradycardia at 48 beats per minute. RV conduction delay. Previously noted diffuse inferior and anterolateral T wave inversion  Prior ECG of 01/20/2013: sinus bradycardia at 55 beats per minute. Previously noted diffuse T-wave inversion in leads 59F, V2 through V6. Mild RV conduction delay.  LABS: BMP Latest Ref Rng & Units 03/30/2018 06/01/2017 03/21/2017  Glucose 65 - 99 mg/dL 67 82 90  BUN 8 - 27 mg/dL _1 Creatinine 0.57 - 1.00 mg/dL 0.98 1.36(H) 0.94  BUN/Creat Ratio 12 - _2 -  Sodium 134 - 144 mmol/L 134 137 134(L)  Potassium 3.5 - 5.2 mmol/L 3.8 5.2 3.4(L)  Chloride 96 - 106 mmol/L 91(L) 97 96(L)  CO2 20 - 29 mmol/L _3 Calcium 8.7 - 10.3 mg/dL 8.8 9.0 9.1   Hepatic Function Latest Ref Rng & Units 03/21/2017 07/18/2013 08/10/2012  Total Protein 6.5 - 8.1 g/dL 7.8 7.0 6.8  Albumin 3.5 - 5.0 g/dL 4.0 3.6 4.1  AST 15 - 41 U/L 34 26 17  ALT 14 - 54 U/L _4 Alk Phosphatase 38 - 126 U/L 95 119(H) 85  Total Bilirubin 0.3 - 1.2 mg/dL 0.9 0.3 0.4  CBC Latest Ref Rng & Units 03/30/2018 06/01/2017 03/21/2017  WBC 3.4 - 10.8 x10E3/uL 5.4 6.6 8.7  Hemoglobin 11.1 - 15.9 g/dL 14.2 14.6 14.6  Hematocrit 34.0 - 46.6 % 42.2 46.1 43.8  Platelets 150 - 450 x10E3/uL 191 187 195   Lab Results  Component Value Date   MCV 89 03/30/2018   MCV 91 06/01/2017   MCV 91.4 03/21/2017   Lab Results  Component Value Date   TSH 2.030 04/26/2018   Lipid Panel     Component Value Date/Time    CHOL 120 07/19/2013 0539   TRIG 52 07/19/2013 0539   HDL 45 07/19/2013 0539   CHOLHDL 2.7 07/19/2013 0539   VLDL 10 07/19/2013 0539   LDLCALC 65 07/19/2013 0539   RADIOLOGY: No results found.  IMPRESSION:  1. Permanent atrial fibrillation ([place])   2. Long term current use of anticoagulant   3. Left ventricular hypertrophy   4. Obstructive sleep apnea syndrome   5. Hypothyroidism (acquired)   6. Pacemaker    ASSESSMENT AND PLAN:   Denise Pugh is an 86 year old Caucasian female who has a history of SVT and documented moderate left ventricle hypertrophy  with a "spade-like ventricle."  She had diffuse T-wave abnormalities which have been present for years. In 2003 cardiac catheterization did not demonstrate coronary obstructive disease but she had mild midsystolic bridging not felt to be significant in her LAD territory.  She has a history of SVT, PAF, and had developed bradycardia arrhythmia resulting in her permanent pacemaker implantation in 2015.  She was demonstrated to have persistent atrial fibrillation.  She has a cha2ds2vasc score of 4.  She underwent successful DC cardioversion on June 07, 2017.  When seen in follow-up in December 2019 she was back in atrial fibrillation of questionable duration.  She has been in atrial fibrillation ever since and now has permanent atrial fibrillation..  She had seen Denise Pugh read and a decision was made not to pursue atrial fibrillation ablation.  When last evaluated, her ventricular rate continued to be increased and metoprolol was titrated to 100 mg twice a day.  EKG today confirms A. fib with RVR with a ventricular rate averaging at 118 bpm.  Her blood pressure today is on the low side at 108/82 and for this reason I do not feel as needed Cardizem to her beta-blocker regimen.  An attempt to induce improved rate control I will initiate digoxin at 0.125 mg daily.  Her major complaint today is that of no energy and fatigability.  She has significant  symptoms of obstructive sleep apnea which undoubtedly may be contributing to her complaint.  Presently she is waking up at least 3-4 times per night for urination, her sleep is nonrestorative, she has frequent awakenings, she snores, and has daytime sleepiness.  After much discussion she has agreed to undergo a home sleep study for initial assessment.  She does have venous insufficiency to her lower extremity and occasionally has edema for which she takes HCTZ.  She has hypothyroidism and currently is on levothyroxine at 75 mcg with most recent TSH normal.  She continues to be on atorvastatin 20 mg and lipid studies are excellent.  I will see her in 2 to 3 months for follow-up evaluation and further recommendations were made at that time.  She continues to be seen by Dr. Sallyanne Pugh for her pacemaker and undergoes remote pacemaker checks at 47-monthintervals.   TTroy SineM.D., FGreat Lakes Surgery C[facility] LLC5/07/2019 12:46 PM

## 2019-07-26 NOTE — Patient Instructions (Signed)
Medication Instructions:  BEGIN TAKING DIGOXIN 0.125MG  DAILY  *If you need a refill on your cardiac medications before your next appointment, please call your pharmacy*    Testing/Procedures: Your physician has recommended that you have a sleep study. This test records several body functions during sleep, including: brain activity, eye movement, oxygen and carbon dioxide blood levels, heart rate and rhythm, breathing rate and rhythm, the flow of air through your mouth and nose, snoring, body muscle movements, and chest and belly movement.  HOME SLEEP TEST   Follow-Up: At [facility], you and your health needs are our priority.  As part of our continuing mission to provide you with exceptional heart care, we have created designated Provider Care Teams.  These Care Teams include your primary Cardiologist (physician) and Advanced Practice Providers (APPs -  Physician Assistants and Nurse Practitioners) who all work together to provide you with the care you need, when you need it.  We recommend signing up for the patient portal called "MyChart".  Sign up information is provided on this After Visit Summary.  MyChart is used to connect with patients for Virtual Visits (Telemedicine).  Patients are able to view lab/test results, encounter notes, upcoming appointments, etc.  Non-urgent messages can be sent to your provider as well.   To learn more about what you can do with MyChart, go to [email or web address].    Your next appointment:   2-3 month(s)  The format for your next appointment:   In Person  Provider:   [name] MD

## 2019-07-27 ENCOUNTER — Telehealth: Payer: Self-pay | Admitting: *Deleted

## 2019-07-27 NOTE — Telephone Encounter (Signed)
-----   Message from Roland Earl sent at 07/26/2019 12:35 PM EDT ----- Regarding: Home Sleep Study Ordered by [name]

## 2019-08-02 ENCOUNTER — Other Ambulatory Visit: Payer: Self-pay | Admitting: Cardiovascular Disease

## 2019-08-03 ENCOUNTER — Ambulatory Visit (INDEPENDENT_AMBULATORY_CARE_PROVIDER_SITE_OTHER): Payer: Medicare Other | Admitting: *Deleted

## 2019-08-03 DIAGNOSIS — R001 Bradycardia, unspecified: Secondary | ICD-10-CM

## 2019-08-04 ENCOUNTER — Telehealth: Payer: Self-pay

## 2019-08-04 LAB — CUP PACEART REMOTE DEVICE CHECK
Battery Remaining Longevity: 29 mo
Battery Voltage: 2.95 V
Brady Statistic AP VP Percent: 1.33 %
Brady Statistic AP VS Percent: 0.27 %
Brady Statistic AS VP Percent: 13.02 %
Brady Statistic AS VS Percent: 85.37 %
Brady Statistic RA Percent Paced: 0.97 %
Brady Statistic RV Percent Paced: 9.84 %
Date Time Interrogation Session: 20210514130648
Implantable Lead Implant Date: 20150430
Implantable Lead Implant Date: 20150430
Implantable Lead Location: 753859
Implantable Lead Location: 753860
Implantable Lead Model: 5076
Implantable Lead Model: 5076
Implantable Pulse Generator Implant Date: 20150430
Lead Channel Impedance Value: 342 Ohm
Lead Channel Impedance Value: 418 Ohm
Lead Channel Impedance Value: 456 Ohm
Lead Channel Impedance Value: 513 Ohm
Lead Channel Pacing Threshold Amplitude: 1 V
Lead Channel Pacing Threshold Amplitude: 1.125 V
Lead Channel Pacing Threshold Pulse Width: 0.4 ms
Lead Channel Pacing Threshold Pulse Width: 0.4 ms
Lead Channel Sensing Intrinsic Amplitude: 0.625 mV
Lead Channel Sensing Intrinsic Amplitude: 0.625 mV
Lead Channel Sensing Intrinsic Amplitude: 13.75 mV
Lead Channel Sensing Intrinsic Amplitude: 13.75 mV
Lead Channel Setting Pacing Amplitude: 2.25 V
Lead Channel Setting Pacing Amplitude: 2.5 V
Lead Channel Setting Pacing Pulse Width: 0.4 ms
Lead Channel Setting Sensing Sensitivity: 4 mV

## 2019-08-04 NOTE — Telephone Encounter (Signed)
Left message for patient to remind of missed remote transmission.  

## 2019-08-07 NOTE — Progress Notes (Signed)
Remote pacemaker transmission.   

## 2019-08-30 DIAGNOSIS — H353132 Nonexudative age-related macular degeneration, bilateral, intermediate dry stage: Secondary | ICD-10-CM | POA: Diagnosis not present

## 2019-08-31 ENCOUNTER — Telehealth: Payer: Self-pay | Admitting: *Deleted

## 2019-08-31 NOTE — Telephone Encounter (Signed)
Opened in error

## 2019-08-31 NOTE — Telephone Encounter (Signed)
HST appointment left on both home and cell numbers by Gae Bon.

## 2019-09-04 NOTE — Progress Notes (Signed)
  Subjective:  Patient ID: Denise Pugh, female    DOB: 01/11/1929,  MRN: [id number]  Chief Complaint  Patient presents with  . Nail Problem    Thick, long toenails. Requests nail trim.  . Callouses    L hallux. x3-6 yrs. Pt stated, "I grip with my toes due to arthritis. Callus developed. This nail also grew back after it was removed (ingrown procedure). I soak my foot".    86 y.o. female presents with the above complaint. History confirmed with patient.   Objective:  Physical Exam: warm, good capillary refill, nail exam onychomycosis of the toenails and pain to palpatioon, no trophic changes or ulcerative lesions. DP pulses palpable, PT pulses palpable and protective sensation intact Left Foot: normal exam, no swelling, tenderness, instability; ligaments intact, full range of motion of all ankle/foot joints  Right Foot: normal exam, no swelling, tenderness, instability; ligaments intact, full range of motion of all ankle/foot joints   No images are attached to the encounter.  Assessment:   1. Pain due to onychomycosis of nail      Plan:  Patient was evaluated and treated and all questions answered.  Onychomycosis -Nails palliatively debrided secondary to pain   Procedure: Nail Debridement Rationale: Pain Type of Debridement: manual, sharp debridement. Instrumentation: Nail nipper, rotary burr. Number of Nails: 10     No follow-ups on file.

## 2019-09-11 ENCOUNTER — Ambulatory Visit: Payer: Medicare Other | Attending: Cardiovascular Disease | Admitting: Cardiovascular Disease

## 2019-09-11 ENCOUNTER — Other Ambulatory Visit: Payer: Self-pay

## 2019-09-11 DIAGNOSIS — I517 Cardiomegaly: Secondary | ICD-10-CM | POA: Diagnosis not present

## 2019-09-11 DIAGNOSIS — Z79899 Other long term (current) drug therapy: Secondary | ICD-10-CM | POA: Diagnosis not present

## 2019-09-11 DIAGNOSIS — I4821 Permanent atrial fibrillation: Secondary | ICD-10-CM | POA: Insufficient documentation

## 2019-09-11 DIAGNOSIS — Z7901 Long term (current) use of anticoagulants: Secondary | ICD-10-CM | POA: Diagnosis not present

## 2019-09-11 DIAGNOSIS — Z79890 Hormone replacement therapy (postmenopausal): Secondary | ICD-10-CM | POA: Diagnosis not present

## 2019-09-11 DIAGNOSIS — R0902 Hypoxemia: Secondary | ICD-10-CM | POA: Insufficient documentation

## 2019-09-11 DIAGNOSIS — G4733 Obstructive sleep apnea (adult) (pediatric): Secondary | ICD-10-CM | POA: Diagnosis not present

## 2019-09-15 ENCOUNTER — Encounter: Payer: Self-pay | Admitting: Cardiovascular Disease

## 2019-09-15 NOTE — Procedures (Signed)
                                                                       [place] [facility]    Patient Name: Denise Pugh, Denise Pugh Date: 09/11/2019 Gender: Female D.O.B: [birth year] Age (years): 50 Referring Provider: [name] MD, ABSM Height (inches): 63 Interpreting Physician: [name] MD, ABSM Weight (lbs): 161 RPSGT: Peak, Robert BMI: 29 MRN: [id number] Neck Size: <br>  CLINICAL INFORMATION Sleep Study Type: HST  Indication for sleep study: nonresorative sleep; snoring; daytimne sleepiness  Epworth Sleepiness Score: NA  SLEEP STUDY TECHNIQUE A multi-channel overnight portable sleep study was performed. The channels recorded were: nasal airflow, thoracic respiratory movement, and oxygen saturation with a pulse oximetry. Snoring was also monitored.  MEDICATIONS acetaminophen (TYLENOL) 500 MG tablet atorvastatin (LIPITOR) 20 MG tablet Cholecalciferol (VITAMIN D) 2000 UNITS tablet dexlansoprazole (DEXILANT) 60 MG capsule diclofenac Sodium (VOLTAREN) 1 % GEL digoxin (LANOXIN) 0.125 MG tablet docusate sodium (COLACE) 100 MG capsule ELIQUIS 5 MG TABS tablet escitalopram (LEXAPRO) 20 MG tablet hydrochlorothiazide (MICROZIDE) 12.5 MG capsule levothyroxine (SYNTHROID) 75 MCG tablet Liniments (DEEP BLUE RELIEF EX) metoprolol tartrate (LOPRESSOR) 100 MG tablet Multiple Vitamins-Minerals (PRESERVISION AREDS 2 PO) mupirocin ointment (BACTROBAN) 2 % OVER THE COUNTER MEDICATION oxybutynin (DITROPAN) 5 MG tablet sodium chloride (OCEAN) 0.65 % SOLN nasal spray traMADol (ULTRAM) 50 MG tablet  Patient self administered medications include: N/A.  SLEEP ARCHITECTURE Patient was studied for 391.4 minutes. The sleep efficiency was 81.6 % and the patient was supine for 72%. The arousal index was 0.0 per hour.  RESPIRATORY PARAMETERS The overall AHI was 57.5 per hour, with a central apnea index of 0.3 per hour.  The oxygen nadir was 78% during sleep.  CARDIAC DATA Mean heart  rate during sleep was 60.6 bpm.  IMPRESSIONS - Severe obstructive sleep apnea occurred during this study (AHI 57.5/h); events were worse with supine sleep (AHI 64.0/h) - No significant central sleep apnea occurred during this study (CAI = 0.3/h). - Severe oxygen desaturation to a nadir of 78%. Time spent < 88% was 80.5 minutes. - Patient snored 1.9% during the sleep.  DIAGNOSIS - Obstructive Sleep Apnea (327.23 [G47.33 ICD-10]) - Nocturnal Hypoxemia (327.26 [G47.36 ICD-10])  RECOMMENDATIONS - In this patient with cardiovascular co-morbidities and very severe sleep apnea, recommend an in-lab CPAP titration study. - Effort shouldd be made to optimize nasal and oropharyngeal patency. - Positional therapy avoiding supine position during sleep. - Avoid alcohol, sedatives and other CNS depressants that may worsen sleep apnea and disrupt normal sleep architecture. - Sleep hygiene should be reviewed to assess factors that may improve sleep quality. - Weight management and regular exercise should be initiated or continued. - Recommend a download after CPAP initiation and sleep clinic evaluation after 4 weeks of therapy.   [Electronically signed] 09/15/2019 01:16 PM  [name] MD, [facility], [place], American Board of Sleep Medicine   NPI: [id number] [place] PH: [phone]   FX: [phone] [place]

## 2019-10-03 ENCOUNTER — Telehealth: Payer: Self-pay | Admitting: *Deleted

## 2019-10-03 NOTE — Telephone Encounter (Signed)
-----   Message from Troy Sine, MD sent at 09/15/2019  1:23 PM EDT ----- Mariann Laster, please notify pt of results and schedule for in-lab CPAP titration study

## 2019-10-03 NOTE — Telephone Encounter (Signed)
Informed patient of sleep study results and patient understanding was verbalized. Patient understands her sleep study showed: IMPRESSIONS - Severe obstructive sleep apnea occurred during this study (AHI 57.5/h); events were worse with supine sleep (AHI 64.0/h) - DIAGNOSIS - Obstructive Sleep Apnea (327.23 [G47.33 ICD-10]) - Nocturnal Hypoxemia (327.26 [G47.36 ICD-10])  RECOMMENDATIONS - In this patient with cardiovascular co-morbidities and very severe sleep apnea, recommend an in-lab CPAP titration study.  Pt is aware and agreeable to her results. Titration sent to sleep pool.

## 2019-10-05 ENCOUNTER — Other Ambulatory Visit: Payer: Self-pay

## 2019-10-05 ENCOUNTER — Ambulatory Visit: Payer: Medicare Other | Admitting: Podiatry

## 2019-10-05 DIAGNOSIS — B351 Tinea unguium: Secondary | ICD-10-CM

## 2019-10-05 DIAGNOSIS — L84 Corns and callosities: Secondary | ICD-10-CM

## 2019-10-05 DIAGNOSIS — D689 Coagulation defect, unspecified: Secondary | ICD-10-CM

## 2019-10-05 NOTE — Progress Notes (Signed)
  Subjective:  Patient ID: Denise Pugh, female    DOB: [birth year],  MRN: [id number]  Chief Complaint  Patient presents with  . Nail Problem    Nail trim 1-5 bilateral  . Callouses    Bilateral callouses   86 y.o. female presents with the above complaint. History confirmed with patient.   Objective:  Physical Exam: warm, good capillary refill, nail exam onychomycosis of the toenails, no trophic changes or ulcerative lesions. DP pulses palpable, PT pulses palpable and protective sensation intact Left Foot: callus of heel  Right Foot: submet 1 callus   No images are attached to the encounter.  Assessment:   1. Onychomycosis   2. Coagulation defect ([place])   3. Callus    Plan:  Patient was evaluated and treated and all questions answered.  Onychomycosis and Coagulation Defect  -Would benefit from routine care   Procedure: Nail Debridement Rationale: Patient meets criteria for routine foot care due to coag defect Type of Debridement: manual, sharp debridement. Instrumentation: Nail nipper, rotary burr. Number of Nails: 10  Procedure: Paring of Lesion Rationale: painful hyperkeratotic lesion Type of Debridement: manual, sharp debridement. Instrumentation: 312 blade Number of Lesions: 2  Return in about 3 months (around 01/05/2020) for Routine Foot Care - on Anticoagulant.

## 2019-10-06 DIAGNOSIS — I4821 Permanent atrial fibrillation: Secondary | ICD-10-CM | POA: Diagnosis not present

## 2019-10-06 DIAGNOSIS — I739 Peripheral vascular disease, unspecified: Secondary | ICD-10-CM | POA: Diagnosis not present

## 2019-10-09 ENCOUNTER — Other Ambulatory Visit: Payer: Self-pay | Admitting: Cardiovascular Disease

## 2019-10-09 DIAGNOSIS — G4733 Obstructive sleep apnea (adult) (pediatric): Secondary | ICD-10-CM

## 2019-10-10 ENCOUNTER — Telehealth: Payer: Self-pay | Admitting: *Deleted

## 2019-10-10 NOTE — Telephone Encounter (Signed)
Left message call was in reference to CPAP titration study appointment. Will call back at a later time.

## 2019-10-30 ENCOUNTER — Ambulatory Visit: Payer: Medicare Other | Admitting: Cardiovascular Disease

## 2019-10-30 ENCOUNTER — Other Ambulatory Visit: Payer: Self-pay

## 2019-10-30 VITALS — BP 122/77 | HR 61 | Temp 91.0°F | Ht 63.0 in | Wt 154.6 lb

## 2019-10-30 DIAGNOSIS — Z7901 Long term (current) use of anticoagulants: Secondary | ICD-10-CM | POA: Diagnosis not present

## 2019-10-30 DIAGNOSIS — I422 Other hypertrophic cardiomyopathy: Secondary | ICD-10-CM

## 2019-10-30 DIAGNOSIS — I4821 Permanent atrial fibrillation: Secondary | ICD-10-CM | POA: Diagnosis not present

## 2019-10-30 DIAGNOSIS — E039 Hypothyroidism, unspecified: Secondary | ICD-10-CM

## 2019-10-30 DIAGNOSIS — G4733 Obstructive sleep apnea (adult) (pediatric): Secondary | ICD-10-CM

## 2019-10-30 DIAGNOSIS — Z950 Presence of cardiac pacemaker: Secondary | ICD-10-CM | POA: Diagnosis not present

## 2019-10-30 DIAGNOSIS — E785 Hyperlipidemia, unspecified: Secondary | ICD-10-CM

## 2019-10-30 NOTE — Progress Notes (Signed)
Patient ID: Denise Pugh, female   DOB: May 08, 1928, 86 y.o.   MRN: [id number]     HPI: KANOE Pugh is a 86 y.o. female who presents to the office today for a 3 month followup cardiology evaluation.  [name] has a history of SVTand moderate left ventricular hypertrophy with documented "spade-like ventricle."  She has previously documented diffuse T-wave abnormalities. A stress test in May 2013 showed breast attenuation artifact with a post stress ejection fraction of 70% without evidence for scar or ischemia. She had experienced recurrent episodes of palpitations. A cardiac monitor revealed episodes of SVT up to 185 beats per minute and her beta blocker dose was increased.  A CardioNet monitor on her increased dose of Toprol-XL 25 mg still revealed bursts of atrial tachycardia/A flutter rate at 189 beats per minute.  On  May 17 she had an episode of tachycardia palpitations and did also have episodes of bradycardia; some of her spells suggested SVT at 150  in addition to episode of accelerated idioventricular rhythm when she was sinus bradycardic.  A 2-D echo Doppler study on 09/15/2012 showed an ejection fraction of 01-74%,BSWHQ 1 diastolic dysfunction and elevated LV filling pressures.  The aortic valve was as sclerotic without stenosis and there was mild/moderate central regurgitation. 10 mild mitral regurgitation and mild tricuspid regurgitation with mild pulmonary hypertension with a PA estimate pressure at 47 mm.  In April 2015, she was admitted to [facility] hospital with atrial fibrillation with a rapid ventricular response.  She also had a CardioNet monitor which had shown episodes of sinus bradycardia, as well as PAF, with rates up to 170 beats per minute with also questionable nonsustained VT versus actual fibrillation with aberrancy.  She was started on amiodarone. Her Eliquis was held, and she underwent permanent pacemaker insertion.  She was seen in August 2015 by Dr. Sallyanne Kuster for  pacemaker follow-up.  Her underlying rhythm was sinus bradycardia at 38 bpm, and she was pacing the atrium greater than 99% of the time without hardly any episodes of ventricular pacing.  She recently saw Dr. Sallyanne Kuster for one-year evaluation in October 2016.  She has both sinus node dysfunction and AV conduction abnormalities.  Her pacemaker is programmed with MVP on and she has a very long AV conduction time so as to prevent ventricular pacing.  She underwent a pacemaker evaluation with Dr. Sallyanne Kuster was in October 2017.   She has been undergoing 3 month interval device checks.  She was atrially paced and ventricular sensing with a long AV delay.  Estimated longevity is 6.5 years.  There is not been any detected atrial fibrillation or ventricular tachycardia.   She has had difficulty with low back discomfort making it difficult to walk.  She has been undergoing physical therapy.  She also received injections and also had acupuncture.  She admits to purposeful weight loss over the last year and has lost approximately 20 pounds.  She denies any episodes of chest pain.  There have been no episodes of recurrent atrial fibrillation.  She continues to be on amiodarone 200 mg daily and takes eliquis 5 mg twice a day for anticoagulation.  She continues to be on losartan HCT and metoprolol for hypertension.    She was seen by Dr. Sallyanne Kuster early March 2019.  Pacemaker interrogation showed uninterrupted atrial fibrillation since late December 2018.  Rate control was good averaging in the 80s.  She had noticed more shortness of breath and was symptomatic with AF and had  been compliant with anticoagulation she underwent cardioversion on June 07, 2017 successfully.  Postprocedure ECG showed atrially paced and ventricular sensed rhythm.  She has not noticed any major change in her symptoms following the cardioversion.  She denies chest pain.  She denies palpitations.  She has been maintained on amiodarone 400 mg daily in  addition to metoprolol 50 mg twice a day.  She is on Eliquis 5 mg twice a day.  She continues with atorvastatin for hyperlipidemia.  She has had some issues with osteoarthritis.    I saw her in June 23, 2017 at which time she was atrially paced prolonged AV conduction.  I recommended that she reduce her amiodarone to 300 mg daily.  Subsequently, amiodarone dose was reduced to 200 mg daily  I  saw her in June 2019 at which time she was maintaining sinus rhythm.  She was having some mild lower extremity edema and her blood pressure was elevated; HCTZ 12.5 mg added to her regimen.  I last saw her in December 2019 at which time she was back in atrial fibrillation.  She was on amiodarone 200 mg daily, Eliquis 5 mg twice a day, metoprolol 50 mg twice a day in addition to HCTZ.  I recommended short-term increase in amiodarone back to 200 mg twice a day.  Since I have seen her, she has had follow-up evaluations with Dr. Sallyanne Kuster, Jory Sims, NP,  andDr. Rayann Heman.  She is now considered permanent atrial fibrillation.  There was some discussion about possible consideration for ablation which she had seen Dr. Rayann Heman who did not recommend this approach.    I saw her in October 2020.  Her atrial fibrillation rate was controlled.  She was having issues significant fatigability and continues to be tired. Of note, remotely she had been diagnosed with sleep apnea over 10 years ago and was on CPAP therapy until her husband became ill 2013 since she had a take care of him all the time. She has not been on CPAP therapy for at least 7 years. She was going to bed between 11 PM and midnight and typically wakes up at noon. She does snore. When she wakes up she is still very tired. She has recently seen Dr. Wende Pugh who checked laboratory and she was told that her labs were stable. The patient was taken off amiodarone by Dr. Sallyanne Kuster and she has been on an increase metoprolol regimen at 75 mg twice a day.  During that  evaluation, her atrial fibrillation rate was increased at 113 despite taking metoprolol 75 mg twice a day.  I recommended slight titration of her a.m. dose to 100 mg  and that she continue the 75 mg evening dose.  She was on levothyroxine for hypothyroidism with laboratory being followed by Dr. [name].  I also discussed her untreated sleep apnea as a contributor to her fatigability.  She was last evaluated by me in a telemedicine visit on May 11, 2019.  At that time she was still aware that her heart rate is increased.  She denied any episodes of chest pain.  She admits to noticing more swelling in her legs.  She denies significant dietary change with reference to sodium intake.  She denies chest pain.  She does have issues with arthritis.  She admitted being more fatigued today but had the Covid vaccination the day prior to her evaluation.  With her increased heart rate I recommended further titration of metoprolol to 100 mg twice a day.  She was tolerating Eliquis for anticoagulation.  She was having intermittent leg swelling I suggested she can take an extra 12.5 mg HCTZ on an as-needed basis.    When I last saw her in May 2021 over the past several months, Ms. Mcbrearty has continued to have problems with no energy.  Her heart rate has remained elevated.  She sleeps poorly.  She is waking up at least 3-4 times per night for urination.  Her sleep is nonrestorative.  She believes she is snoring.  She continues to have daytime sleepiness.  She underwent laboratory by Dr. Eddie Dibbles on June 12, 2019.  Hemoglobin and hematocrit were stable at 14.6/46.3.  BUN 12 creatinine 0.79.  LFTs were normal.  Lipid studies were excellent with total cholesterol 102.  LDL cholesterol 47.  Vitamin D level was 71.5.  TSH on her current dose of levothyroxine was normal at 3.07.  Her ECG showed atrial fibrillation with a ventricular rate at 118 bpm.  Blood pressure was on the low side in I elected to initiate low-dose digoxin at 0.125  mg for additional rate control.  I was concerned about sleep apnea contributing to some of her marked fatigability with nocturia I recommended she undergo a sleep evaluation.    She underwent a sleep study on September 11, 2019 and was found to have severe sleep apnea with an AHI of 57.5/h with events being worse with supine sleep with an AHI of 64.0/h.  She had significant oxygen desaturation to a nadir of 78% and time spent less than 88% was 80.5 minutes.  CPAP titration was recommended which is scheduled for August 11.  He has noticed significant improvement with her palpitations and heart rate with the addition of digoxin to her medical regimen.  She presents for evaluation.  Past Medical History:  Diagnosis Date  . Arrhythmia    HX of SVT. CARDIONET MONITOR 07/15/12 TO 08/13/12  . Arthritis   . Family history of adverse reaction to anesthesia 1970s   mother  . GERD (gastroesophageal reflux disease)   . Hyperlipidemia 08/06/11   lexiscan myoview-normal. Due to severe attenuation the study specificity and sensitivty are deminished.  . Hypertension 08/25/10   ECHO-EF 60-65%  . Hypothyroidism   . Irregular heart beat   . Shortness of breath   . Sleep apnea    SLEEP STUDY-[facility]  . Thyroid disease     Past Surgical History:  Procedure Laterality Date  . CARDIAC CATHETERIZATION  07/06/01   spade-like configuration  . CARDIOVERSION N/A 06/07/2017   Procedure: CARDIOVERSION;  Surgeon: [name], MD;  Location: [facility];  Service: Cardiovascular;  Laterality: N/A;  . CARDIOVERSION N/A 04/14/2018   Procedure: CARDIOVERSION;  Surgeon: [name], MD;  Location: [facility];  Service: Cardiovascular;  Laterality: N/A;  . DILATION AND CURETTAGE OF UTERUS    . EYE SURGERY    . KNEE ARTHROSCOPY    . PERMANENT PACEMAKER INSERTION N/A 07/20/2013   Procedure: PERMANENT PACEMAKER INSERTION;  Surgeon: [name], MD;  Location: [facility];  Service:  Cardiovascular;  Laterality: N/A;    Allergies  Allergen Reactions  . Penicillins Other (See Comments)    Has patient had a PCN reaction causing immediate rash, facial/tongue/throat swelling, SOB or lightheadedness with hypotension: Unknown Has patient had a PCN reaction causing severe rash involving mucus membranes or skin necrosis: Unknown Has patient had a PCN reaction that required hospitalization: Unknown Has patient had a PCN reaction occurring within the  last 10 years: Unknown If all of the above answers are "NO", then may proceed with Cephalosporin use.   . Tetanus Toxoid Swelling    Current Outpatient Medications  Medication Sig Dispense Refill  . acetaminophen (TYLENOL) 500 MG tablet Take 500 mg by mouth every 6 (six) hours as needed for moderate pain, fever or headache.     Marland Kitchen atorvastatin (LIPITOR) 20 MG tablet TAKE ONE-HALF TABLET BY  MOUTH DAILY 45 tablet 3  . Cholecalciferol (VITAMIN D) 2000 UNITS tablet Take 2,000 Units by mouth daily.    Marland Kitchen dexlansoprazole (DEXILANT) 60 MG capsule Take 60 mg by mouth daily.    . diclofenac Sodium (VOLTAREN) 1 % GEL Apply topically as needed. 1% gel as needed for pain    . digoxin (LANOXIN) 0.125 MG tablet Take 1 tablet (0.125 mg total) by mouth daily. 90 tablet 3  . docusate sodium (COLACE) 100 MG capsule Take 100 mg by mouth 2 (two) times daily.    Marland Kitchen ELIQUIS 5 MG TABS tablet TAKE 1 TABLET BY MOUTH  TWICE DAILY 180 tablet 3  . escitalopram (LEXAPRO) 20 MG tablet Take 1 tablet (20 mg total) by mouth daily. 90 tablet 3  . hydrochlorothiazide (MICROZIDE) 12.5 MG capsule TAKE 1 CAPSULE BY MOUTH  DAILY 90 capsule 3  . levothyroxine (SYNTHROID) 75 MCG tablet TAKE 1 TABLET BY MOUTH  DAILY BEFORE BREAKFAST 90 tablet 3  . Liniments (DEEP BLUE RELIEF EX) Apply 1 application topically daily as needed (for back, shoulder and knee pain).    . metoprolol tartrate (LOPRESSOR) 100 MG tablet Take 1 tablet (100 mg total) by mouth every morning AND 1 tablet  (100 mg total) every evening. 180 tablet 3  . Multiple Vitamins-Minerals (PRESERVISION AREDS 2 PO) Take 2 tablets by mouth daily.    . mupirocin ointment (BACTROBAN) 2 % Place 1 application into the nose daily as needed (for dry nose).    Marland Kitchen nystatin (MYCOSTATIN/NYSTOP) powder Apply topically 2 (two) times daily.    Marland Kitchen OVER THE COUNTER MEDICATION Place 1 drop into both ears daily. Organic Ear Oil    . oxybutynin (DITROPAN) 5 MG tablet Take 5 mg by mouth 2 (two) times daily.     . sodium chloride (OCEAN) 0.65 % SOLN nasal spray Place 1 spray into both nostrils as needed for congestion.    . traMADol (ULTRAM) 50 MG tablet Take 100 mg by mouth 2 (two) times daily.      No current facility-administered medications for this visit.   Social history is notable in that she is now. Her husband did suffer a CVA and has been having more difficulty at home. She has one child, 2 step sons, one grandchild and 2 great-grandchildren. There is no tobacco or alcohol use. She does admit to frequent anxiety.  ROS General: Negative; No fevers, chills, or night sweats; positive for fatigue HEENT: Negative; No changes in vision or hearing, sinus congestion, difficulty swallowing Pulmonary: Negative; No cough, wheezing, shortness of breath, hemoptysis Cardiovascular: see HPI GI: Negative; No nausea, vomiting, diarrhea, or abdominal pain GU: Negative; No dysuria, hematuria, or difficulty voiding Musculoskeletal: Positive for low back pain and osteoarthritis Hematologic/Oncology: Negative; no easy bruising, bleeding Endocrine: Negative; no heat/cold intolerance; no diabetes Neuro: Negative; no changes in balance, headaches Skin: Negative; No rashes or skin lesions Psychiatric: Negative; No behavioral problems, depression Sleep: Positive for snoring, fatigue, daytime sleepiness, remote history of sleep apnea and previously used CPAP daytime sleepiness, hypersomnolence, bruxism, restless legs, hypnogognic  hallucinations,  no cataplexy Other comprehensive 14 point system review is negative.   PE BP 122/77   Pulse 61   Temp (!) 91 F (32.8 C)   Ht _0  (1.6 m)   Wt 154 lb 9.6 oz (70.1 kg)   SpO2 95%   BMI 27.39 kg/m    Repeat blood pressure by me was 110/78.  P blood pressure by me was 126/70 Wt Readings from Last 3 Encounters:  10/30/19 154 lb 9.6 oz (70.1 kg)  07/26/19 161 lb 12.8 oz (73.4 kg)  05/11/19 160 lb (72.6 kg)  2016: Weight 181 pounds   General: Alert, oriented, no distress.  Skin: normal turgor, no rashes, warm and dry HEENT: Normocephalic, atraumatic. Pupils equal round and reactive to light; sclera anicteric; extraocular muscles intact;  Nose without nasal septal hypertrophy Mouth/Parynx benign; Mallinpatti scale 3 Neck: No JVD, no carotid bruits; normal carotid upstroke Lungs: clear to ausculatation and percussion; no wheezing or rales Chest wall: without tenderness to palpitation Heart: PMI not displaced, regular and paced at 60, s1 s2 normal, 1/6 systolic murmur, no diastolic murmur, no rubs, gallops, thrills, or heaves Abdomen: soft, nontender; no hepatosplenomehaly, BS+; abdominal aorta nontender and not dilated by palpation. Back: no CVA tenderness Pulses 2+ Musculoskeletal: full range of motion, normal strength, no joint deformities Extremities: no clubbing cyanosis or edema, Homan's sign negative  Neurologic: grossly nonfocal; Cranial nerves grossly wnl Psychologic: Normal mood and affect   ECG (independently read by me): Ventricular paced rhythm at 61  May 2021 ECG (independently read by me): Atrial fibrillation with a ventricular rate of 118 bpm.  Incomplete right bundle branch block.  Previously noted ST wave abnormality laterally.  October 2020 ECG (independently read by me): Atrial fibrillation at 113; IRBBB, NSSTT changes  December 2019 ECG (independently read by me): Atrial fibrillation at 83 bpm with  nonspecific ST-T changes . The last 3  beats of the ECG show ventricular pacing and 1 beat with AV pacing   September 14, 2017 ECG (independently read by me): Atrially paced rhythm at 64 bpm.  Prolonged AV conduction with PR interval of _1 ms.  Inferolateral ST wave abnormality.  TC interval 443 ms.  June 23, 2017 ECG (independently read by me): Atrially paced with prolonged AV conduction with a PR interval at 276.  Ventricular rate 63.  Anterolateral T wave changes  October 2018 ECG (independently read by me): Atrially paced rhythm at 69 bpm.  Prolonged AV conduction at 298 ms. ST-T abnormality anterolaterally.  January 2018 ECG (independently read by me): Atrial paced rhythm at 68 bpm.  Prolonged AV conduction at 300 ms. ,  Incomplete right bundle-branch block Anterolateral ST-T changes  April 2017 ECG (independently read by me): Sinus rhythm with markedly prolonged AV interval at ~ 440 msec.  Right bundle branch block  November 2016 ECG (independently read by me): 100% atrial pacing with a markedly prolonged PR interval at 480 ms.  Ventricular sensing.  Incomplete right bundle branch block.  ECG (independently read by me): Atrial paced rhythm at 86 bpm with prolonged PR segment.  T-wave inversion  December 2015 ECG (independently read by me): Atrially paced rhythm with ventricular sensing with incomplete right bundle branch block.  Previously noted T-wave abnormalities  07/24/2013 ECG: Atrial lead paced rhythm.  Mild RV conduction delay.  Diffuse T-wave inversion has been present previously  Prior ECG today  (independently read by me): Bradycardia at 48 beats per minute. RV conduction delay. Previously noted diffuse inferior  and anterolateral T wave inversion  Prior ECG of 01/20/2013: sinus bradycardia at 55 beats per minute. Previously noted diffuse T-wave inversion in leads 41F, V2 through V6. Mild RV conduction delay.  LABS: BMP Latest Ref Rng & Units 03/30/2018 06/01/2017 03/21/2017  Glucose 65 - 99 mg/dL 67 82 90  BUN 8 - 27  mg/dL _0 Creatinine 0.57 - 1.00 mg/dL 0.98 1.36(H) 0.94  BUN/Creat Ratio 12 - _1 -  Sodium 134 - 144 mmol/L 134 137 134(L)  Potassium 3.5 - 5.2 mmol/L 3.8 5.2 3.4(L)  Chloride 96 - 106 mmol/L 91(L) 97 96(L)  CO2 20 - 29 mmol/L _2 Calcium 8.7 - 10.3 mg/dL 8.8 9.0 9.1   Hepatic Function Latest Ref Rng & Units 03/21/2017 07/18/2013 08/10/2012  Total Protein 6.5 - 8.1 g/dL 7.8 7.0 6.8  Albumin 3.5 - 5.0 g/dL 4.0 3.6 4.1  AST 15 - 41 U/L 34 26 17  ALT 14 - 54 U/L _3 Alk Phosphatase 38 - 126 U/L 95 119(H) 85  Total Bilirubin 0.3 - 1.2 mg/dL 0.9 0.3 0.4   CBC Latest Ref Rng & Units 03/30/2018 06/01/2017 03/21/2017  WBC 3.4 - 10.8 x10E3/uL 5.4 6.6 8.7  Hemoglobin 11.1 - 15.9 g/dL 14.2 14.6 14.6  Hematocrit 34.0 - 46.6 % 42.2 46.1 43.8  Platelets 150 - 450 x10E3/uL 191 187 195   Lab Results  Component Value Date   MCV 89 03/30/2018   MCV 91 06/01/2017   MCV 91.4 03/21/2017   Lab Results  Component Value Date   TSH 2.030 04/26/2018   Lipid Panel     Component Value Date/Time   CHOL 120 07/19/2013 0539   TRIG 52 07/19/2013 0539   HDL 45 07/19/2013 0539   CHOLHDL 2.7 07/19/2013 0539   VLDL 10 07/19/2013 0539   LDLCALC 65 07/19/2013 0539   RADIOLOGY: No results found.  IMPRESSION:  1. Obstructive sleep apnea (adult) (pediatric)   2. Permanent atrial fibrillation ([place])   3. Pacemaker   4. Hypothyroidism, unspecified type   5. Long term current use of anticoagulant   6. Apical variant hypertrophic cardiomyopathy ([place])   7. Hyperlipidemia LDL goal <70      ASSESSMENT AND PLAN:   Ms. Smeal is an 86 year old Caucasian female who has a history of SVT and documented moderate left ventricle hypertrophy  with a "spade-like ventricle."  She had diffuse T-wave abnormalities which have been present for years. In 2003 cardiac catheterization did not demonstrate coronary obstructive disease but she had mild midsystolic bridging not felt to be significant in her  LAD territory.  She has a history of SVT, PAF, and had developed bradycardia arrhythmia resulting in her permanent pacemaker implantation in 2015.  She was demonstrated to have persistent atrial fibrillation.  She has a cha2ds2vasc score of 4.  She underwent successful DC cardioversion on June 07, 2017.  When seen in follow-up in December 2019 she was back in atrial fibrillation of questionable duration and has since been in permanent atrial fibrillation.  She had seen Dr. Ellender Hose and a decision was made not to pursue atrial fibrillation ablation.  He had had issues with increased heart rate necessitating titration of beta-blocker therapy.  At her last evaluation digoxin was added for rate control.  ECG today shows V paced rhythm and she no longer has AF with RVR.  She is on Eliquis for anticoagulation and is tolerating this well without bleeding.  Her blood pressure  today is stable at 126/70.  I reviewed her sleep study which demonstrates severe sleep apnea with significant oxygen desaturation.  She will be undergoing her CPAP titration study for optimal treatment.  I discussed with her the importance of meeting compliance standards and the importance of using CPAP for the nights entire duration particularly since the preponderance of REM sleep occurs in the second half of the night.  She continues to be on levothyroxine for hypothyroidism.  She continues to be on atorvastatin 20 mg with excellent lipid studies.  I will see her in 2 months for follow-up evaluation after initiation of CPAP therapy and further recommendations were made at that time.   Troy Sine M.D., [facility] 11/01/2019 1:42 PM

## 2019-10-30 NOTE — Patient Instructions (Signed)
Medication Instructions:  CONTINUE WITH CURRENT MEDICATIONS. NO CHANGES.  *If you need a refill on your cardiac medications before your next appointment, please call your pharmacy*    Follow-Up: At [facility], you and your health needs are our priority.  As part of our continuing mission to provide you with exceptional heart care, we have created designated Provider Care Teams.  These Care Teams include your primary Cardiologist (physician) and Advanced Practice Providers (APPs -  Physician Assistants and Nurse Practitioners) who all work together to provide you with the care you need, when you need it.  We recommend signing up for the patient portal called "MyChart".  Sign up information is provided on this After Visit Summary.  MyChart is used to connect with patients for Virtual Visits (Telemedicine).  Patients are able to view lab/test results, encounter notes, upcoming appointments, etc.  Non-urgent messages can be sent to your provider as well.   To learn more about what you can do with MyChart, go to [email or web address].    Your next appointment:   2-3 month(s)  The format for your next appointment:   In Person  Provider:   Thomas Kelly, MD    

## 2019-11-01 ENCOUNTER — Other Ambulatory Visit: Payer: Self-pay

## 2019-11-01 ENCOUNTER — Encounter: Payer: Self-pay | Admitting: Cardiovascular Disease

## 2019-11-01 ENCOUNTER — Ambulatory Visit: Payer: Medicare Other | Attending: Cardiovascular Disease | Admitting: Cardiovascular Disease

## 2019-11-01 DIAGNOSIS — Z791 Long term (current) use of non-steroidal anti-inflammatories (NSAID): Secondary | ICD-10-CM | POA: Diagnosis not present

## 2019-11-01 DIAGNOSIS — Z79899 Other long term (current) drug therapy: Secondary | ICD-10-CM | POA: Diagnosis not present

## 2019-11-01 DIAGNOSIS — G4733 Obstructive sleep apnea (adult) (pediatric): Secondary | ICD-10-CM | POA: Insufficient documentation

## 2019-11-01 DIAGNOSIS — Z7901 Long term (current) use of anticoagulants: Secondary | ICD-10-CM | POA: Diagnosis not present

## 2019-11-02 ENCOUNTER — Ambulatory Visit (INDEPENDENT_AMBULATORY_CARE_PROVIDER_SITE_OTHER): Payer: Medicare Other | Admitting: *Deleted

## 2019-11-02 DIAGNOSIS — I495 Sick sinus syndrome: Secondary | ICD-10-CM | POA: Diagnosis not present

## 2019-11-05 ENCOUNTER — Other Ambulatory Visit: Payer: Self-pay | Admitting: Cardiovascular Disease

## 2019-11-06 ENCOUNTER — Encounter: Payer: Self-pay | Admitting: Cardiovascular Disease

## 2019-11-06 LAB — CUP PACEART REMOTE DEVICE CHECK
Battery Remaining Longevity: 28 mo
Battery Voltage: 2.95 V
Brady Statistic RA Percent Paced: 1.33 %
Brady Statistic RV Percent Paced: 30.38 %
Date Time Interrogation Session: 20210812205522
Implantable Lead Implant Date: 20150430
Implantable Lead Implant Date: 20150430
Implantable Lead Location: 753859
Implantable Lead Location: 753860
Implantable Lead Model: 5076
Implantable Lead Model: 5076
Implantable Pulse Generator Implant Date: 20150430
Lead Channel Impedance Value: 342 Ohm
Lead Channel Impedance Value: 437 Ohm
Lead Channel Impedance Value: 456 Ohm
Lead Channel Impedance Value: 513 Ohm
Lead Channel Pacing Threshold Amplitude: 0.875 V
Lead Channel Pacing Threshold Amplitude: 1.125 V
Lead Channel Pacing Threshold Pulse Width: 0.4 ms
Lead Channel Pacing Threshold Pulse Width: 0.4 ms
Lead Channel Sensing Intrinsic Amplitude: 0.625 mV
Lead Channel Sensing Intrinsic Amplitude: 0.625 mV
Lead Channel Sensing Intrinsic Amplitude: 19.125 mV
Lead Channel Sensing Intrinsic Amplitude: 19.125 mV
Lead Channel Setting Pacing Amplitude: 2.25 V
Lead Channel Setting Pacing Amplitude: 2.5 V
Lead Channel Setting Pacing Pulse Width: 0.4 ms
Lead Channel Setting Sensing Sensitivity: 4 mV

## 2019-11-06 NOTE — Progress Notes (Signed)
Remote pacemaker transmission.   

## 2019-11-06 NOTE — Procedures (Signed)
[facility]        Patient Name: Denise Pugh, Denise Pugh Date: 11/01/2019 Gender: Female D.O.B: [birth year] Age (years): 8 Referring Provider: [name] MD, ABSM Height (inches): 63 Interpreting Physician: [name] MD, ABSM Weight (lbs): 161 RPSGT: Peak, Robert BMI: 29 MRN: [id number] Neck Size: 13.00  CLINICAL INFORMATION The patient is referred for a split night study with BPAP.  Prior HST: 09/11/2019: AHI 57.5/h; supine AHI 64.0/h; O2 nadir 78%.  MEDICATIONS acetaminophen (TYLENOL) 500 MG tablet atorvastatin (LIPITOR) 20 MG tablet Cholecalciferol (VITAMIN D) 2000 UNITS tablet dexlansoprazole (DEXILANT) 60 MG capsule diclofenac Sodium (VOLTAREN) 1 % GEL digoxin (LANOXIN) 0.125 MG tablet docusate sodium (COLACE) 100 MG capsule ELIQUIS 5 MG TABS tablet escitalopram (LEXAPRO) 20 MG tablet hydrochlorothiazide (MICROZIDE) 12.5 MG capsule levothyroxine (SYNTHROID) 75 MCG tablet Liniments (DEEP BLUE RELIEF EX) metoprolol tartrate (LOPRESSOR) 100 MG tablet Multiple Vitamins-Minerals (PRESERVISION AREDS 2 PO) mupirocin ointment (BACTROBAN) 2 % nystatin (MYCOSTATIN/NYSTOP) powder OVER THE COUNTER MEDICATION oxybutynin (DITROPAN) 5 MG tablet sodium chloride (OCEAN) 0.65 % SOLN nasal spray traMADol (ULTRAM) 50 MG tablet Medications self-administered by patient taken the night of the study : N/A  SLEEP STUDY TECHNIQUE As per the AASM Manual for the Scoring of Sleep and Associated Events v2.3 (April 2016) with a hypopnea requiring 4% desaturations.  The channels recorded and monitored were frontal, central and occipital EEG, electrooculogram (EOG), submentalis EMG (chin), nasal and oral airflow, thoracic and abdominal wall motion, anterior tibialis EMG, snore microphone, electrocardiogram, and pulse oximetry. Bi-level positive airway pressure (BiPAP) was initiated when the patient met split night criteria and was titrated according to treat sleep-disordered  breathing.  RESPIRATORY PARAMETERS Diagnostic Total AHI (/hr): 19.4 RDI (/hr): 19.4 OA Index (/hr): - CA Index (/hr): 3.4 REM AHI (/hr): N/A NREM AHI (/hr): 19.4 Supine AHI (/hr): 0.0 Non-supine AHI (/hr): 20.00 Min O2 Sat (%): 85.0 Mean O2 (%): 90.9 Time below 88% (min): 7.4   Titration Optimal IPAP Pressure (cm):  Optimal EPAP Pressure (cm):  AHI at Optimal Pressure (/hr): N/A Min O2 at Optimal Pressure (%): N/A Sleep % at Optimal (%): N/A Supine % at Optimal (%): N/A   SLEEP ARCHITECTURE The study was initiated at 9:39:46 PM and terminated at 5:02:05 AM. The total recorded time was 442.3 minutes. EEG confirmed total sleep time was 52.5 minutes yielding a sleep efficiency of 11.9%%. Sleep onset after lights out was 69.2 minutes with a REM latency of N/A minutes. The patient spent 62.9%% of the night in stage N1 sleep, 37.1%% in stage N2 sleep, 0.0%% in stage N3 and 0% in REM. Wake after sleep onset (WASO) was 320.6 minutes. The Arousal Index was 48.0/hour.  LEG MOVEMENT DATA The total Periodic Limb Movements of Sleep (PLMS) were 0. The PLMS index was 0.0 .  CARDIAC DATA The 2 lead EKG demonstrated pacemaker generated. The mean heart rate was 100.0 beats per minute. Other EKG findings include: None.  IMPRESSIONS - Moderate obstructive sleep apnea occurred during the diagnostic portion of the study (AHI 19.4 /h).  The patient did not tolerate CPAP and was switched to BiPAP.  An optimal BPAP pressure could not be selected for this patient based on the available study data. - No significant central sleep apnea occurred during the diagnostic portion of the study (CAI = 3.4/hour). - Very poor sleep efficiency at 11.9%. - No snoring was audible during this study. - No cardiac abnormalities were noted during this study. - Clinically significant periodic limb movements of sleep did  not occur during the study.  DIAGNOSIS - Obstructive Sleep Apnea (G47.33)  RECOMMENDATIONS - Recommend a  trial of BiPAP Auto with EPAP min of 5, PS of 4 and IPAP max at 16 with heated humidification. Recommend a trial of ResMed F30i since some oral breathing was noted at transition from wake to sleep. - Effort shouod be made to optimize nasal and oropharyngeal patency. - Avoid alcohol, sedatives and other CNS depressants that may worsen sleep apnea and disrupt normal sleep architecture. - Sleep hygiene should be reviewed to assess factors that may improve sleep quality. - Weight management and regular exercise should be initiated or continued. - Recommend a download in 30 days and sleep clinic evaluation after 4 weeks of therapy.  [Electronically signed] 11/06/2019 10:18 AM  [name] MD, [facility], ABSM Diplomate, American Board of Sleep Medicine   NPI: [id number]  [place] PH: [phone]   FX: [phone] [place]

## 2019-11-09 ENCOUNTER — Telehealth: Payer: Self-pay | Admitting: Cardiovascular Disease

## 2019-11-09 DIAGNOSIS — E7849 Other hyperlipidemia: Secondary | ICD-10-CM | POA: Diagnosis not present

## 2019-11-09 DIAGNOSIS — I10 Essential (primary) hypertension: Secondary | ICD-10-CM | POA: Diagnosis not present

## 2019-11-09 DIAGNOSIS — I4821 Permanent atrial fibrillation: Secondary | ICD-10-CM | POA: Diagnosis not present

## 2019-11-09 DIAGNOSIS — M1991 Primary osteoarthritis, unspecified site: Secondary | ICD-10-CM | POA: Diagnosis not present

## 2019-11-09 DIAGNOSIS — I739 Peripheral vascular disease, unspecified: Secondary | ICD-10-CM | POA: Diagnosis not present

## 2019-11-09 NOTE — Telephone Encounter (Signed)
Shundra is calling requesting to speak with a nurse in regards to her sleep test. Please advise.

## 2019-11-09 NOTE — Telephone Encounter (Signed)
Routed to sleep coordinator to call with sleep study results and recommendations

## 2019-11-13 ENCOUNTER — Telehealth: Payer: Self-pay | Admitting: *Deleted

## 2019-11-13 NOTE — Telephone Encounter (Signed)
-----   Message from Troy Sine, MD sent at 11/06/2019 10:23 AM EDT ----- Mariann Laster, please set up for BiPAP Auto with DME, and have her try masks to see what works best

## 2019-11-13 NOTE — Telephone Encounter (Signed)
Spoke with Gershon Cull, she will call and notify the patient.

## 2019-11-13 NOTE — Telephone Encounter (Signed)
Follow up   Pt is calling back to follow up, pt said [name] advised her she needs to schedule for a follow up with him for sleep.. she's been waiting for a call since last week.

## 2019-11-13 NOTE — Telephone Encounter (Signed)
Informed patient of sleep study results and patient understanding was verbalized. Pt is aware and agreeable to her results.

## 2019-11-13 NOTE — Telephone Encounter (Signed)
Informed patient of sleep study results and patient understanding was verbalized. Patient understands his sleep study showed: - No snoring was audible during this study. - No cardiac abnormalities were noted during this study. - Clinically significant periodic limb movements of sleep did not occur during the study.  Total AHI (/hr):            19.4    Min O2 Sat (%):          85.0   DIAGNOSIS - Obstructive Sleep Apnea (G47.33)  RECOMMENDATIONS - Recommend a trial of BiPAP Auto with EPAP min of 5, PS of 4 and IPAP max at 16 with heated humidification. Recommend a trial of ResMed F30i since some oral breathing was noted at transition from wake to sleep.  Upon patient request DME selection is [facility]. Patient understands she will be contacted by [facility] to set up her cpap. Patient understands to call if [facility] does not contact her with new setup in a timely manner. Patient understands they will be called once confirmation has been received from [facility] that they have received their new machine to schedule 10 week follow up appointment.  [facility] notified of new cpap order  Please add to airview Patient was grateful for the call and thanked me.

## 2019-11-21 DIAGNOSIS — G4733 Obstructive sleep apnea (adult) (pediatric): Secondary | ICD-10-CM | POA: Diagnosis not present

## 2019-11-29 DIAGNOSIS — G4733 Obstructive sleep apnea (adult) (pediatric): Secondary | ICD-10-CM | POA: Diagnosis not present

## 2019-12-07 DIAGNOSIS — B349 Viral infection, unspecified: Secondary | ICD-10-CM | POA: Diagnosis not present

## 2019-12-12 DIAGNOSIS — I739 Peripheral vascular disease, unspecified: Secondary | ICD-10-CM | POA: Diagnosis not present

## 2019-12-12 DIAGNOSIS — M1991 Primary osteoarthritis, unspecified site: Secondary | ICD-10-CM | POA: Diagnosis not present

## 2019-12-12 DIAGNOSIS — I10 Essential (primary) hypertension: Secondary | ICD-10-CM | POA: Diagnosis not present

## 2019-12-12 DIAGNOSIS — I4821 Permanent atrial fibrillation: Secondary | ICD-10-CM | POA: Diagnosis not present

## 2019-12-12 DIAGNOSIS — E7849 Other hyperlipidemia: Secondary | ICD-10-CM | POA: Diagnosis not present

## 2019-12-15 DIAGNOSIS — R5383 Other fatigue: Secondary | ICD-10-CM | POA: Diagnosis not present

## 2019-12-15 DIAGNOSIS — E039 Hypothyroidism, unspecified: Secondary | ICD-10-CM | POA: Diagnosis not present

## 2019-12-15 DIAGNOSIS — M25511 Pain in right shoulder: Secondary | ICD-10-CM | POA: Diagnosis not present

## 2019-12-15 DIAGNOSIS — E559 Vitamin D deficiency, unspecified: Secondary | ICD-10-CM | POA: Diagnosis not present

## 2019-12-15 DIAGNOSIS — M1991 Primary osteoarthritis, unspecified site: Secondary | ICD-10-CM | POA: Diagnosis not present

## 2019-12-19 DIAGNOSIS — E559 Vitamin D deficiency, unspecified: Secondary | ICD-10-CM | POA: Diagnosis not present

## 2019-12-19 DIAGNOSIS — I4891 Unspecified atrial fibrillation: Secondary | ICD-10-CM | POA: Diagnosis not present

## 2019-12-19 DIAGNOSIS — E782 Mixed hyperlipidemia: Secondary | ICD-10-CM | POA: Diagnosis not present

## 2019-12-19 DIAGNOSIS — Z0001 Encounter for general adult medical examination with abnormal findings: Secondary | ICD-10-CM | POA: Diagnosis not present

## 2019-12-19 DIAGNOSIS — K219 Gastro-esophageal reflux disease without esophagitis: Secondary | ICD-10-CM | POA: Diagnosis not present

## 2019-12-21 ENCOUNTER — Telehealth: Payer: Self-pay | Admitting: Cardiovascular Disease

## 2019-12-21 DIAGNOSIS — G4733 Obstructive sleep apnea (adult) (pediatric): Secondary | ICD-10-CM | POA: Diagnosis not present

## 2019-12-21 NOTE — Telephone Encounter (Signed)
Left message for pt to call.

## 2019-12-21 NOTE — Telephone Encounter (Signed)
    Pt would like to speak with Dr. Evette Georges nurse. She said she is not feeling well and feeling alone. Pt was crying, she denies any CP or SOB, but she said she really need to speak with a nurse.

## 2019-12-26 NOTE — Telephone Encounter (Signed)
Pt returned call  From 9/30   Best number -[phone]

## 2019-12-26 NOTE — Telephone Encounter (Signed)
Returned call to patient. Patient states that she has been having issues with her CPAP machine. Patient states that she accidentally disconnected the hose from the machine and water spilled out, now causing a leak error message on her machine. Advised patient to contact company that she got machine from to see if they can help with that issue. Patient would also like her CPAP machine supplies switched over to [facility] in [place] due to that being more easily accessible to her. Advised patient I would forward message to our sleep coordinator. Patient verbalized understanding.

## 2019-12-27 ENCOUNTER — Telehealth: Payer: Self-pay | Admitting: *Deleted

## 2019-12-27 NOTE — Telephone Encounter (Signed)
Returned call. Gae Bon: She said it is not broken but it reads water leak. please call patient. she just want ot transfer to [employer] it would be better for her. She has me on the phone saying she needs help. She is with [facility] and says it is too far out for her to drive. Mariann Laster says:  I can change her but she still needs to tell them what's wrong. IDK if she has to pay them anything or not. that is why she needs to call them before switching to [place]. Gae Bon: She said she is not used it much in the past few days. Mariann Laster: she needs to talk to [facility] due to the financials Ivin Booty will have to tell her what she needs to do before she can switch.  Gae Bon: She said ok but it will not be today. I told her to ask for sharon.

## 2019-12-27 NOTE — Telephone Encounter (Signed)
Patient notified that after records were sent to [facility] I received a call from [place] [facility] [place] notifying me they cannot take her on as a patient. She will have to stay with [facility]. Patient asked me the reason. I told her she will need to discuss this with them. Patient also notified that if she decides to let [facility] pick up her machine and change her mind later on to retry using it, per Medicare guidelines she will have to restart the process all over with having another sleep study. Patient was also told that after she speaks with Mariann Laster at [facility] and decides to keep the machine she will need to inform Ivin Booty at [facility] so they can cancel the pickup order. Patient voiced verbal understanding of what has been communicated to her. She plans to contact [place] at [employer].

## 2019-12-27 NOTE — Telephone Encounter (Signed)
Denise Pugh at [facility]. Informed her that she might want to call the patient to discuss what she needs to do financially so that the patient can get switched to [place]. Also informed her the patient stated to Gae Bon that she has spilled water on the machine now she is getting a leak error.

## 2019-12-27 NOTE — Telephone Encounter (Signed)
Received a call from [place] at [facility]. She spoke with the patient and they are planning to pick up her machine tomorrow. I will go ahead and submit a transfer to [place]. The financials will be discussed with the patient by [employer].

## 2020-01-10 ENCOUNTER — Ambulatory Visit: Payer: Medicare Other | Admitting: Podiatry

## 2020-01-10 ENCOUNTER — Other Ambulatory Visit: Payer: Self-pay

## 2020-01-10 ENCOUNTER — Encounter: Payer: Self-pay | Admitting: Podiatry

## 2020-01-10 DIAGNOSIS — D689 Coagulation defect, unspecified: Secondary | ICD-10-CM | POA: Diagnosis not present

## 2020-01-10 DIAGNOSIS — L84 Corns and callosities: Secondary | ICD-10-CM

## 2020-01-10 DIAGNOSIS — B351 Tinea unguium: Secondary | ICD-10-CM

## 2020-01-10 DIAGNOSIS — M79609 Pain in unspecified limb: Secondary | ICD-10-CM | POA: Diagnosis not present

## 2020-01-14 NOTE — Progress Notes (Signed)
Subjective:  Patient ID: Denise Pugh, female    DOB: 03-Apr-1928,  MRN: [id number]  CHESSA Pugh presents to clinic today for at risk foot care with h/o clotting disorder and painful callus(es) left hallux and painful thick toenails that are difficult to trim. Painful toenails interfere with ambulation. Aggravating factors include wearing enclosed shoe gear. Pain is relieved with periodic professional debridement. Painful calluses are aggravated when weightbearing with and without shoegear. Pain is relieved with periodic professional debridement..  86 y.o. female presents with the above complaint.    Review of Systems: Negative except as noted in the HPI. Past Medical History:  Diagnosis Date  . Arrhythmia    HX of SVT. CARDIONET MONITOR 07/15/12 TO 08/13/12  . Arthritis   . Family history of adverse reaction to anesthesia 1970s   mother  . GERD (gastroesophageal reflux disease)   . Hyperlipidemia 08/06/11   lexiscan myoview-normal. Due to severe attenuation the study specificity and sensitivty are deminished.  . Hypertension 08/25/10   ECHO-EF 60-65%  . Hypothyroidism   . Irregular heart beat   . Shortness of breath   . Sleep apnea    SLEEP STUDY-[facility]  . Thyroid disease    Past Surgical History:  Procedure Laterality Date  . CARDIAC CATHETERIZATION  07/06/01   spade-like configuration  . CARDIOVERSION N/A 06/07/2017   Procedure: CARDIOVERSION;  Surgeon: [name], MD;  Location: [place] ENDOSCOPY;  Service: Cardiovascular;  Laterality: N/A;  . CARDIOVERSION N/A 04/14/2018   Procedure: CARDIOVERSION;  Surgeon: [name], MD;  Location: [facility] ENDOSCOPY;  Service: Cardiovascular;  Laterality: N/A;  . DILATION AND CURETTAGE OF UTERUS    . EYE SURGERY    . KNEE ARTHROSCOPY    . PERMANENT PACEMAKER INSERTION N/A 07/20/2013   Procedure: PERMANENT PACEMAKER INSERTION;  Surgeon: [name], MD;  Location: [place] CATH LAB;  Service: Cardiovascular;   Laterality: N/A;    Current Outpatient Medications:  .  acetaminophen (TYLENOL) 500 MG tablet, Take 500 mg by mouth every 6 (six) hours as needed for moderate pain, fever or headache. , Disp: , Rfl:  .  atorvastatin (LIPITOR) 20 MG tablet, TAKE ONE-HALF TABLET BY  MOUTH DAILY, Disp: 45 tablet, Rfl: 3 .  Cholecalciferol (VITAMIN D) 2000 UNITS tablet, Take 2,000 Units by mouth daily., Disp: , Rfl:  .  dexlansoprazole (DEXILANT) 60 MG capsule, Take 60 mg by mouth daily., Disp: , Rfl:  .  diclofenac Sodium (VOLTAREN) 1 % GEL, Apply topically as needed. 1% gel as needed for pain, Disp: , Rfl:  .  digoxin (LANOXIN) 0.125 MG tablet, Take 1 tablet (0.125 mg total) by mouth daily., Disp: 90 tablet, Rfl: 3 .  docusate sodium (COLACE) 100 MG capsule, Take 100 mg by mouth 2 (two) times daily., Disp: , Rfl:  .  ELIQUIS 5 MG TABS tablet, TAKE 1 TABLET BY MOUTH  TWICE DAILY, Disp: 180 tablet, Rfl: 3 .  escitalopram (LEXAPRO) 20 MG tablet, TAKE 1 TABLET BY MOUTH  DAILY, Disp: 90 tablet, Rfl: 3 .  hydrochlorothiazide (MICROZIDE) 12.5 MG capsule, TAKE 1 CAPSULE BY MOUTH  DAILY, Disp: 90 capsule, Rfl: 3 .  levothyroxine (SYNTHROID) 75 MCG tablet, TAKE 1 TABLET BY MOUTH  DAILY BEFORE BREAKFAST, Disp: 90 tablet, Rfl: 3 .  Liniments (DEEP BLUE RELIEF EX), Apply 1 application topically daily as needed (for back, shoulder and knee pain)., Disp: , Rfl:  .  metoprolol tartrate (LOPRESSOR) 100 MG tablet, Take 1 tablet (100 mg total) by  mouth every morning AND 1 tablet (100 mg total) every evening., Disp: 180 tablet, Rfl: 3 .  Multiple Vitamins-Minerals (PRESERVISION AREDS 2 PO), Take 2 tablets by mouth daily., Disp: , Rfl:  .  mupirocin ointment (BACTROBAN) 2 %, Place 1 application into the nose daily as needed (for dry nose)., Disp: , Rfl:  .  nystatin (MYCOSTATIN/NYSTOP) powder, Apply topically 2 (two) times daily., Disp: , Rfl:  .  OVER THE COUNTER MEDICATION, Place 1 drop into both ears daily. Organic Ear Oil, Disp: ,  Rfl:  .  oxybutynin (DITROPAN) 5 MG tablet, Take 5 mg by mouth 2 (two) times daily. , Disp: , Rfl:  .  sodium chloride (OCEAN) 0.65 % SOLN nasal spray, Place 1 spray into both nostrils as needed for congestion., Disp: , Rfl:  .  traMADol (ULTRAM) 50 MG tablet, Take 100 mg by mouth 2 (two) times daily. , Disp: , Rfl:  Allergies  Allergen Reactions  . Penicillins Other (See Comments)    Has patient had a PCN reaction causing immediate rash, facial/tongue/throat swelling, SOB or lightheadedness with hypotension: Unknown Has patient had a PCN reaction causing severe rash involving mucus membranes or skin necrosis: Unknown Has patient had a PCN reaction that required hospitalization: Unknown Has patient had a PCN reaction occurring within the last 10 years: Unknown If all of the above answers are "NO", then may proceed with Cephalosporin use.   . Tetanus Toxoid Swelling   Social History   Occupational History  . Not on file  Tobacco Use  . Smoking status: Never Smoker  . Smokeless tobacco: Never Used  Vaping Use  . Vaping Use: Never used  Substance and Sexual Activity  . Alcohol use: No    Alcohol/week: 0.0 standard drinks  . Drug use: No  . Sexual activity: Never    Objective:   Constitutional CIDNEY Pugh is a pleasant 86 y.o. Caucasian female, in NAD. AAO x 3.   Vascular Capillary fill time to digits <3 seconds b/l lower extremities. Palpable pedal pulses b/l LE. Pedal hair absent. Lower extremity skin temperature gradient within normal limits. No pain with calf compression b/l. No edema noted b/l lower extremities. No cyanosis or clubbing noted.  Neurologic Normal speech. Oriented to person, place, and time. Protective sensation intact 5/5 intact bilaterally with 10g monofilament b/l.  Dermatologic Pedal skin with normal turgor, texture and tone bilaterally. No open wounds bilaterally. No interdigital macerations bilaterally. Toenails 1-5 b/l elongated, discolored, dystrophic,  thickened, crumbly with subungual debris and tenderness to dorsal palpation. Hyperkeratotic lesion(s) L hallux.  No erythema, no edema, no drainage, no flocculence.  Orthopedic: Normal muscle strength 5/5 to all lower extremity muscle groups bilaterally. No pain crepitus or joint limitation noted with ROM b/l. No gross bony deformities bilaterally. Hammertoes noted to the b/l lower extremities.   Radiographs: None Assessment:   1. Pain due to onychomycosis of nail   2. Callus   3. Coagulation defect [facility])    Plan:  Patient was evaluated and treated and all questions answered.  Onychomycosis with pain -Nails palliatively debridement as below -Educated on self-care  Procedure: Nail Debridement Rationale: Pain Type of Debridement: manual, sharp debridement. Instrumentation: Nail nipper, rotary burr. Number of Nails: 10 -Examined patient. -Toenails 1-5 b/l were debrided in length and girth with sterile nail nippers and dremel without iatrogenic bleeding.  -Callus(es) L hallux pared utilizing sterile scalpel blade without complication or incident. Total number debrided =1. -Patient to report any pedal injuries to medical  professional immediately. -Patient to continue soft, supportive shoe gear daily. -Patient/POA to call should there be question/concern in the interim.  Return in about 3 months (around 04/11/2020).  Marzetta Board, DPM

## 2020-01-15 DIAGNOSIS — E039 Hypothyroidism, unspecified: Secondary | ICD-10-CM | POA: Diagnosis not present

## 2020-01-15 DIAGNOSIS — M25511 Pain in right shoulder: Secondary | ICD-10-CM | POA: Diagnosis not present

## 2020-01-15 DIAGNOSIS — R5383 Other fatigue: Secondary | ICD-10-CM | POA: Diagnosis not present

## 2020-01-23 ENCOUNTER — Other Ambulatory Visit: Payer: Self-pay | Admitting: Cardiovascular Disease

## 2020-02-01 ENCOUNTER — Ambulatory Visit (INDEPENDENT_AMBULATORY_CARE_PROVIDER_SITE_OTHER): Payer: Medicare Other

## 2020-02-01 DIAGNOSIS — I495 Sick sinus syndrome: Secondary | ICD-10-CM

## 2020-02-03 LAB — CUP PACEART REMOTE DEVICE CHECK
Battery Remaining Longevity: 23 mo
Battery Voltage: 2.94 V
Brady Statistic RA Percent Paced: 1.61 %
Brady Statistic RV Percent Paced: 86.26 %
Date Time Interrogation Session: 20211112160321
Implantable Lead Implant Date: 20150430
Implantable Lead Implant Date: 20150430
Implantable Lead Location: 753859
Implantable Lead Location: 753860
Implantable Lead Model: 5076
Implantable Lead Model: 5076
Implantable Pulse Generator Implant Date: 20150430
Lead Channel Impedance Value: 342 Ohm
Lead Channel Impedance Value: 418 Ohm
Lead Channel Impedance Value: 456 Ohm
Lead Channel Impedance Value: 513 Ohm
Lead Channel Pacing Threshold Amplitude: 0.875 V
Lead Channel Pacing Threshold Amplitude: 1.125 V
Lead Channel Pacing Threshold Pulse Width: 0.4 ms
Lead Channel Pacing Threshold Pulse Width: 0.4 ms
Lead Channel Sensing Intrinsic Amplitude: 0.375 mV
Lead Channel Sensing Intrinsic Amplitude: 0.375 mV
Lead Channel Sensing Intrinsic Amplitude: 19.375 mV
Lead Channel Sensing Intrinsic Amplitude: 19.375 mV
Lead Channel Setting Pacing Amplitude: 2.25 V
Lead Channel Setting Pacing Amplitude: 2.5 V
Lead Channel Setting Pacing Pulse Width: 0.4 ms
Lead Channel Setting Sensing Sensitivity: 4 mV

## 2020-02-05 NOTE — Progress Notes (Signed)
Remote pacemaker transmission.   

## 2020-02-06 ENCOUNTER — Other Ambulatory Visit: Payer: Self-pay

## 2020-02-06 ENCOUNTER — Encounter: Payer: Self-pay | Admitting: Cardiovascular Disease

## 2020-02-06 ENCOUNTER — Ambulatory Visit: Payer: Medicare Other | Admitting: Cardiovascular Disease

## 2020-02-06 VITALS — BP 130/72 | HR 61 | Ht 63.0 in | Wt 154.2 lb

## 2020-02-06 DIAGNOSIS — Z950 Presence of cardiac pacemaker: Secondary | ICD-10-CM

## 2020-02-06 DIAGNOSIS — I4821 Permanent atrial fibrillation: Secondary | ICD-10-CM

## 2020-02-06 DIAGNOSIS — E039 Hypothyroidism, unspecified: Secondary | ICD-10-CM | POA: Diagnosis not present

## 2020-02-06 DIAGNOSIS — Z7901 Long term (current) use of anticoagulants: Secondary | ICD-10-CM

## 2020-02-06 DIAGNOSIS — E785 Hyperlipidemia, unspecified: Secondary | ICD-10-CM

## 2020-02-06 DIAGNOSIS — G4733 Obstructive sleep apnea (adult) (pediatric): Secondary | ICD-10-CM | POA: Diagnosis not present

## 2020-02-06 DIAGNOSIS — I422 Other hypertrophic cardiomyopathy: Secondary | ICD-10-CM

## 2020-02-06 NOTE — Patient Instructions (Addendum)
Medication Instructions:  Your physician recommends that you continue on your current medications as directed. Please refer to the Current Medication list given to you today.  *If you need a refill on your cardiac medications before your next appointment, please call your pharmacy*  Lab Work: NONE   Testing/Procedures: NONE  Follow-Up: At [facility], you and your health needs are our priority.  As part of our continuing mission to provide you with exceptional heart care, we have created designated Provider Care Teams.  These Care Teams include your primary Cardiologist (physician) and Advanced Practice Providers (APPs -  Physician Assistants and Nurse Practitioners) who all work together to provide you with the care you need, when you need it.  We recommend signing up for the patient portal called "MyChart".  Sign up information is provided on this After Visit Summary.  MyChart is used to connect with patients for Virtual Visits (Telemedicine).  Patients are able to view lab/test results, encounter notes, upcoming appointments, etc.  Non-urgent messages can be sent to your provider as well.   To learn more about what you can do with MyChart, go to [email or web address].    Your next appointment:   6 month(s) You will receive a reminder letter in the mail two months in advance. If you don't receive a letter, please call our office to schedule the follow-up appointment.  The format for your next appointment:   In Person  Provider:   You may see [name], MD or one of the following Advanced Practice Providers on your designated Care Team:    Almyra Deforest, PA-C  Fabian Sharp, [place] or   Roby Lofts, [place]  Your physician recommends that you schedule a follow-up appointment in: DR Gr[place]

## 2020-02-06 NOTE — Progress Notes (Signed)
Patient ID: Denise Pugh, female   DOB: May 08, 1928, 86 y.o.   MRN: [id number]     HPI: Denise Pugh is a 86 y.o. female who presents to the office today for a 3 month followup cardiology evaluation.  Denise Pugh has a history of SVTand moderate left ventricular hypertrophy with documented "spade-like ventricle."  Denise Pugh has previously documented diffuse T-wave abnormalities. A stress test in May 2013 showed breast attenuation artifact with a post stress ejection fraction of 70% without evidence for scar or ischemia. Denise Pugh had experienced recurrent episodes of palpitations. A cardiac monitor revealed episodes of SVT up to 185 beats per minute and Denise Pugh beta blocker dose was increased.  A CardioNet monitor on Denise Pugh increased dose of Toprol-XL 25 mg still revealed bursts of atrial tachycardia/A flutter rate at 189 beats per minute.  On  May 17 Denise Pugh had an episode of tachycardia palpitations and did also have episodes of bradycardia; some of Denise Pugh spells suggested SVT at 150  in addition to episode of accelerated idioventricular rhythm when Denise Pugh was sinus bradycardic.  A 2-D echo Doppler study on 09/15/2012 showed an ejection fraction of 01-74%,BSWHQ 1 diastolic dysfunction and elevated LV filling pressures.  The aortic valve was as sclerotic without stenosis and there was mild/moderate central regurgitation. 10 mild mitral regurgitation and mild tricuspid regurgitation with mild pulmonary hypertension with a PA estimate pressure at 47 mm.  In April 2015, Denise Pugh was admitted to [facility] hospital with atrial fibrillation with a rapid ventricular response.  Denise Pugh also had a CardioNet monitor which had shown episodes of sinus bradycardia, as well as PAF, with rates up to 170 beats per minute with also questionable nonsustained VT versus actual fibrillation with aberrancy.  Denise Pugh was started on amiodarone. Denise Pugh Eliquis was held, and Denise Pugh underwent permanent pacemaker insertion.  Denise Pugh was seen in August 2015 by Dr. Sallyanne Kuster for  pacemaker follow-up.  Denise Pugh underlying rhythm was sinus bradycardia at 38 bpm, and Denise Pugh was pacing the atrium greater than 99% of the time without hardly any episodes of ventricular pacing.  Denise Pugh recently saw Dr. Sallyanne Kuster for one-year evaluation in October 2016.  Denise Pugh has both sinus node dysfunction and AV conduction abnormalities.  Denise Pugh pacemaker is programmed with MVP on and Denise Pugh has a very long AV conduction time so as to prevent ventricular pacing.  Denise Pugh underwent a pacemaker evaluation with Dr. Sallyanne Kuster was in October 2017.   Denise Pugh has been undergoing 3 month interval device checks.  Denise Pugh was atrially paced and ventricular sensing with a long AV delay.  Estimated longevity is 6.5 years.  There is not been any detected atrial fibrillation or ventricular tachycardia.   Denise Pugh has had difficulty with low back discomfort making it difficult to walk.  Denise Pugh has been undergoing physical therapy.  Denise Pugh also received injections and also had acupuncture.  Denise Pugh admits to purposeful weight loss over the last year and has lost approximately 20 pounds.  Denise Pugh denies any episodes of chest pain.  There have been no episodes of recurrent atrial fibrillation.  Denise Pugh continues to be on amiodarone 200 mg daily and takes eliquis 5 mg twice a day for anticoagulation.  Denise Pugh continues to be on losartan HCT and metoprolol for hypertension.    Denise Pugh was seen by Dr. Sallyanne Kuster early March 2019.  Pacemaker interrogation showed uninterrupted atrial fibrillation since late December 2018.  Rate control was good averaging in the 80s.  Denise Pugh had noticed more shortness of breath and was symptomatic with AF and had  been compliant with anticoagulation Denise Pugh underwent cardioversion on June 07, 2017 successfully.  Postprocedure ECG showed atrially paced and ventricular sensed rhythm.  Denise Pugh has not noticed any major change in Denise Pugh symptoms following the cardioversion.  Denise Pugh denies chest pain.  Denise Pugh denies palpitations.  Denise Pugh has been maintained on amiodarone 400 mg daily in  addition to metoprolol 50 mg twice a day.  Denise Pugh is on Eliquis 5 mg twice a day.  Denise Pugh continues with atorvastatin for hyperlipidemia.  Denise Pugh has had some issues with osteoarthritis.    I saw Denise Pugh in June 23, 2017 at which time Denise Pugh was atrially paced prolonged AV conduction.  I recommended that Denise Pugh reduce Denise Pugh amiodarone to 300 mg daily.  Subsequently, amiodarone dose was reduced to 200 mg daily  I  saw Denise Pugh in June 2019 at which time Denise Pugh was maintaining sinus rhythm.  Denise Pugh was having some mild lower extremity edema and Denise Pugh blood pressure was elevated; HCTZ 12.5 mg added to Denise Pugh regimen.  I last saw Denise Pugh in December 2019 at which time Denise Pugh was back in atrial fibrillation.  Denise Pugh was on amiodarone 200 mg daily, Eliquis 5 mg twice a day, metoprolol 50 mg twice a day in addition to HCTZ.  I recommended short-term increase in amiodarone back to 200 mg twice a day.  Since I have seen Denise Pugh, Denise Pugh has had follow-up evaluations with Dr. Sallyanne Kuster, Jory Sims, NP,  andDr. Rayann Heman.  Denise Pugh is now considered permanent atrial fibrillation.  There was some discussion about possible consideration for ablation which Denise Pugh had seen Dr. Rayann Heman who did not recommend this approach.    I saw Denise Pugh in October 2020.  Denise Pugh atrial fibrillation rate was controlled.  Denise Pugh was having issues significant fatigability and continues to be tired. Of note, remotely Denise Pugh had been diagnosed with sleep apnea over 10 years ago and was on CPAP therapy until Denise Pugh husband became ill 2013 since Denise Pugh had a take care of him all the time. Denise Pugh has not been on CPAP therapy for at least 7 years. Denise Pugh was going to bed between 11 PM and midnight and typically wakes up at noon. Denise Pugh does snore. When Denise Pugh wakes up Denise Pugh is still very tired. Denise Pugh has recently seen Dr. Wende Neighbors who checked laboratory and Denise Pugh was told that Denise Pugh labs were stable. The patient was taken off amiodarone by Dr. Sallyanne Kuster and Denise Pugh has been on an increase metoprolol regimen at 75 mg twice a day.  During that  evaluation, Denise Pugh atrial fibrillation rate was increased at 113 despite taking metoprolol 75 mg twice a day.  I recommended slight titration of Denise Pugh a.m. dose to 100 mg  and that Denise Pugh continue the 75 mg evening dose.  Denise Pugh was on levothyroxine for hypothyroidism with laboratory being followed by Dr. [name].  I also discussed Denise Pugh untreated sleep apnea as a contributor to Denise Pugh fatigability.  Denise Pugh was last evaluated by me in a telemedicine visit on May 11, 2019.  At that time Denise Pugh was still aware that Denise Pugh heart rate is increased.  Denise Pugh denied any episodes of chest pain.  Denise Pugh admits to noticing more swelling in Denise Pugh legs.  Denise Pugh denies significant dietary change with reference to sodium intake.  Denise Pugh denies chest pain.  Denise Pugh does have issues with arthritis.  Denise Pugh admitted being more fatigued today but had the Covid vaccination the day prior to Denise Pugh evaluation.  With Denise Pugh increased heart rate I recommended further titration of metoprolol to 100 mg twice a day.  Denise Pugh was tolerating Eliquis for anticoagulation.  Denise Pugh was having intermittent leg swelling I suggested Denise Pugh can take an extra 12.5 mg HCTZ on an as-needed basis.    When I last saw Denise Pugh in May 2021 over the past several months, Denise Pugh has continued to have problems with no energy.  Denise Pugh heart rate has remained elevated.  Denise Pugh sleeps poorly.  Denise Pugh is waking up at least 3-4 times per night for urination.  Denise Pugh sleep is nonrestorative.  Denise Pugh believes Denise Pugh is snoring.  Denise Pugh continues to have daytime sleepiness.  Denise Pugh underwent laboratory by Dr. Eddie Dibbles on June 12, 2019.  Hemoglobin and hematocrit were stable at 14.6/46.3.  BUN 12 creatinine 0.79.  LFTs were normal.  Lipid studies were excellent with total cholesterol 102.  LDL cholesterol 47.  Vitamin D level was 71.5.  TSH on Denise Pugh current dose of levothyroxine was normal at 3.07.  Denise Pugh ECG showed atrial fibrillation with a ventricular rate at 118 bpm.  Blood pressure was on the low side in I elected to initiate low-dose digoxin at 0.125  mg for additional rate control.  I was concerned about sleep apnea contributing to some of Denise Pugh marked fatigability with nocturia I recommended Denise Pugh undergo a sleep evaluation.    Denise Pugh underwent a sleep study on September 11, 2019 and was found to have severe sleep apnea with an AHI of 57.5/h with events being worse with supine sleep with an AHI of 64.0/h.  Denise Pugh had significant oxygen desaturation to a nadir of 78% and time spent less than 88% was 80.5 minutes.  CPAP titration was recommended.  Denise Pugh has noticed significant improvement with Denise Pugh palpitations and heart rate with the addition of digoxin to Denise Pugh medical regimen.    Since I last saw Denise Pugh Denise Pugh underwent Denise Pugh CPAP titration trial in any pain. Denise Pugh did not tolerate CPAP and was transitioned to BiPAP. It was recommended that Denise Pugh initiate therapy with EPAP min of five, pressure support of four, and IPAP max of 16. Denise Pugh received a BiPAP machine apparently became very sick dressed due to water leak. A download was done from August 24 through September 22 and unfortunately Denise Pugh did not meet compliance. However averaging 3 hours and 18 minutes of sleep per night AHI was excellent at 0.6 and Denise Pugh 95th percentile pressure was 9.3/5.7. Denise Pugh ultimately had the machine picked up and Denise Pugh no longer has treatment. Denise Pugh tells me that Dr. Wende Neighbors recently increased Denise Pugh levothyroxine from 75 mcg to 88 mcg. Laboratory on 01/15/2020 showed a TSH of 1.52. Denise Pugh self reduced Denise Pugh dose back to 75 mcg which Denise Pugh had been on for a long time and felt well. Denise Pugh continues to to have permanent atrial fibrillation and is undergoing remote pacemaker checks. Denise Pugh is overdue with an appointment to see Dr. Sallyanne Kuster who Denise Pugh last saw in August 2020. Denise Pugh continues to be on Eliquis for anticoagulation. Creatinine is excellent at 0.79. Denise Pugh is on atorvastatin for hyperlipidemia, metoprolol tartrate 100 mg twice a day and digoxin 0.125 mg daily. Denise Pugh continues to be on Dexilant for GERD. Denise Pugh presents for  evaluation.    Past Medical History:  Diagnosis Date  . Arrhythmia    HX of SVT. CARDIONET MONITOR 07/15/12 TO 08/13/12  . Arthritis   . Family history of adverse reaction to anesthesia 1970s   mother  . GERD (gastroesophageal reflux disease)   . Hyperlipidemia 08/06/11   lexiscan myoview-normal. Due to severe attenuation the study specificity and sensitivty are deminished.  Marland Kitchen  Hypertension 08/25/10   ECHO-EF 60-65%  . Hypothyroidism   . Irregular heart beat   . Shortness of breath   . Sleep apnea    SLEEP STUDY-[facility]  . Thyroid disease     Past Surgical History:  Procedure Laterality Date  . CARDIAC CATHETERIZATION  07/06/01   spade-like configuration  . CARDIOVERSION N/A 06/07/2017   Procedure: CARDIOVERSION;  Surgeon: [name], MD;  Location: [facility];  Service: Cardiovascular;  Laterality: N/A;  . CARDIOVERSION N/A 04/14/2018   Procedure: CARDIOVERSION;  Surgeon: [name], MD;  Location: [facility];  Service: Cardiovascular;  Laterality: N/A;  . DILATION AND CURETTAGE OF UTERUS    . EYE SURGERY    . KNEE ARTHROSCOPY    . PERMANENT PACEMAKER INSERTION N/A 07/20/2013   Procedure: PERMANENT PACEMAKER INSERTION;  Surgeon: [name], MD;  Location: [facility];  Service: Cardiovascular;  Laterality: N/A;    Allergies  Allergen Reactions  . Penicillins Other (See Comments)    Has patient had a PCN reaction causing immediate rash, facial/tongue/throat swelling, SOB or lightheadedness with hypotension: Unknown Has patient had a PCN reaction causing severe rash involving mucus membranes or skin necrosis: Unknown Has patient had a PCN reaction that required hospitalization: Unknown Has patient had a PCN reaction occurring within the last 10 years: Unknown If all of the above answers are "NO", then may proceed with Cephalosporin use.   . Tetanus Toxoid Swelling    Current Outpatient Medications  Medication Sig Dispense Refill   . acetaminophen (TYLENOL) 500 MG tablet Take 500 mg by mouth every 6 (six) hours as needed for moderate pain, fever or headache.     Marland Kitchen atorvastatin (LIPITOR) 20 MG tablet TAKE ONE-HALF TABLET BY  MOUTH DAILY 45 tablet 3  . Cholecalciferol (VITAMIN D) 2000 UNITS tablet Take 2,000 Units by mouth daily.    Marland Kitchen dexlansoprazole (DEXILANT) 60 MG capsule Take 60 mg by mouth daily.    . diclofenac Sodium (VOLTAREN) 1 % GEL Apply topically as needed. 1% gel as needed for pain    . digoxin (LANOXIN) 0.125 MG tablet Take 1 tablet (0.125 mg total) by mouth daily. 90 tablet 3  . docusate sodium (COLACE) 100 MG capsule Take 100 mg by mouth 2 (two) times daily.    Marland Kitchen ELIQUIS 5 MG TABS tablet TAKE 1 TABLET BY MOUTH  TWICE DAILY 180 tablet 3  . escitalopram (LEXAPRO) 20 MG tablet TAKE 1 TABLET BY MOUTH  DAILY 90 tablet 3  . hydrochlorothiazide (MICROZIDE) 12.5 MG capsule TAKE 1 CAPSULE BY MOUTH  DAILY 90 capsule 3  . levothyroxine (SYNTHROID) 75 MCG tablet TAKE 1 TABLET BY MOUTH  DAILY BEFORE BREAKFAST 90 tablet 3  . Liniments (DEEP BLUE RELIEF EX) Apply 1 application topically daily as needed (for back, shoulder and knee pain).    . metoprolol tartrate (LOPRESSOR) 100 MG tablet Take 1 tablet (100 mg total) by mouth every morning AND 1 tablet (100 mg total) every evening. 180 tablet 3  . Multiple Vitamins-Minerals (PRESERVISION AREDS 2 PO) Take 2 tablets by mouth daily.    . mupirocin ointment (BACTROBAN) 2 % Place 1 application into the nose daily as needed (for dry nose).    Marland Kitchen nystatin (MYCOSTATIN/NYSTOP) powder Apply topically 2 (two) times daily. Pt uses as needed.    Marland Kitchen OVER THE COUNTER MEDICATION Place 1 drop into both ears daily. Organic Ear Oil Pt uses as needed    . oxybutynin (DITROPAN) 5 MG tablet  Take 5 mg by mouth 2 (two) times daily.     . sodium chloride (OCEAN) 0.65 % SOLN nasal spray Place 1 spray into both nostrils as needed for congestion.    . traMADol (ULTRAM) 50 MG tablet Take 100 mg by mouth  2 (two) times daily.      No current facility-administered medications for this visit.   Social history is notable in that Denise Pugh is now. Denise Pugh husband did suffer a CVA and has been having more difficulty at home. Denise Pugh has one child, 2 step sons, one grandchild and 2 great-grandchildren. There is no tobacco or alcohol use. Denise Pugh does admit to frequent anxiety.  ROS General: Negative; No fevers, chills, or night sweats; positive for fatigue HEENT: Negative; No changes in vision or hearing, sinus congestion, difficulty swallowing Pulmonary: Negative; No cough, wheezing, shortness of breath, hemoptysis Cardiovascular: see HPI GI: Negative; No nausea, vomiting, diarrhea, or abdominal pain GU: Negative; No dysuria, hematuria, or difficulty voiding Musculoskeletal: Positive for low back pain and osteoarthritis Hematologic/Oncology: Negative; no easy bruising, bleeding Endocrine: Negative; no heat/cold intolerance; no diabetes Neuro: Negative; no changes in balance, headaches Skin: Negative; No rashes or skin lesions Psychiatric: Negative; No behavioral problems, depression Sleep: Positive for snoring, fatigue, daytime sleepiness, remote history of sleep apnea and previously used CPAP daytime sleepiness, hypersomnolence, bruxism, restless legs, hypnogognic hallucinations, no cataplexy Other comprehensive 14 point system review is negative.   PE BP 130/72 (BP Location: Left Arm, Patient Position: Sitting)   Pulse 61   Ht 5\' 3"  (1.6 m)   Wt 154 lb 3.2 oz (69.9 kg)   SpO2 99%   BMI 27.32 kg/m    Repeat blood pressure by me was 132/70  Wt Readings from Last 3 Encounters:  02/06/20 154 lb 3.2 oz (69.9 kg)  10/30/19 154 lb 9.6 oz (70.1 kg)  07/26/19 161 lb 12.8 oz (73.4 kg)  2016: Weight 181 pounds   General: Alert, oriented, no distress.  Skin: normal turgor, no rashes, warm and dry HEENT: Normocephalic, atraumatic. Pupils equal round and reactive to light; sclera anicteric; extraocular muscles  intact;  Nose without nasal septal hypertrophy Mouth/Parynx benign; Mallinpatti scale 3 Neck: No JVD, no carotid bruits; normal carotid upstroke Lungs: clear to ausculatation and percussion; no wheezing or rales Chest wall: without tenderness to palpitation Heart: PMI not displaced, regular and paced at 60, s1 s2 normal, 1/6 systolic murmur, no diastolic murmur, no rubs, gallops, thrills, or heaves Abdomen: soft, nontender; no hepatosplenomehaly, BS+; abdominal aorta nontender and not dilated by palpation. Back: no CVA tenderness Pulses 2+ Musculoskeletal: full range of motion, normal strength, no joint deformities Extremities: no clubbing cyanosis or edema, Homan's sign negative  Neurologic: grossly nonfocal; Cranial nerves grossly wnl Psychologic: Normal mood and affect  ECG (independently read by me): Ventricular paced rhythm at 61 bpm with appropriate pacing as well as an appropriate isolated sense.  August 2021 ECG (independently read by me): Ventricular paced rhythm at 61  May 2021 ECG (independently read by me): Atrial fibrillation with a ventricular rate of 118 bpm.  Incomplete right bundle branch block.  Previously noted ST wave abnormality laterally.  October 2020 ECG (independently read by me): Atrial fibrillation at 113; IRBBB, NSSTT changes  December 2019 ECG (independently read by me): Atrial fibrillation at 83 bpm with  nonspecific ST-T changes . The last 3 beats of the ECG show ventricular pacing and 1 beat with AV pacing   September 14, 2017 ECG (independently read by me): Atrially paced  rhythm at 64 bpm.  Prolonged AV conduction with PR interval of $RemoveBef'2 6 6 'VdQfaUHDhQ$ ms.  Inferolateral ST wave abnormality.  TC interval 443 ms.  June 23, 2017 ECG (independently read by me): Atrially paced with prolonged AV conduction with a PR interval at 276.  Ventricular rate 63.  Anterolateral T wave changes  October 2018 ECG (independently read by me): Atrially paced rhythm at 69 bpm.  Prolonged AV  conduction at 298 ms. ST-T abnormality anterolaterally.  January 2018 ECG (independently read by me): Atrial paced rhythm at 68 bpm.  Prolonged AV conduction at 300 ms. ,  Incomplete right bundle-branch block Anterolateral ST-T changes  April 2017 ECG (independently read by me): Sinus rhythm with markedly prolonged AV interval at ~ 440 msec.  Right bundle branch block  November 2016 ECG (independently read by me): 100% atrial pacing with a markedly prolonged PR interval at 480 ms.  Ventricular sensing.  Incomplete right bundle branch block.  ECG (independently read by me): Atrial paced rhythm at 86 bpm with prolonged PR segment.  T-wave inversion  December 2015 ECG (independently read by me): Atrially paced rhythm with ventricular sensing with incomplete right bundle branch block.  Previously noted T-wave abnormalities  07/24/2013 ECG: Atrial lead paced rhythm.  Mild RV conduction delay.  Diffuse T-wave inversion has been present previously  Prior ECG today  (independently read by me): Bradycardia at 48 beats per minute. RV conduction delay. Previously noted diffuse inferior and anterolateral T wave inversion  Prior ECG of 01/20/2013: sinus bradycardia at 55 beats per minute. Previously noted diffuse T-wave inversion in leads 15F, V2 through V6. Mild RV conduction delay.  LABS: BMP Latest Ref Rng & Units 03/30/2018 06/01/2017 03/21/2017  Glucose 65 - 99 mg/dL 67 82 90  BUN 8 - 27 mg/dL $Remove'13 20 15  'OstJTtu$ Creatinine 0.57 - 1.00 mg/dL 0.98 1.36(H) 0.94  BUN/Creat Ratio 12 - $Re'28 13 15 'DAb$ -  Sodium 134 - 144 mmol/L 134 137 134(L)  Potassium 3.5 - 5.2 mmol/L 3.8 5.2 3.4(L)  Chloride 96 - 106 mmol/L 91(L) 97 96(L)  CO2 20 - 29 mmol/L $RemoveB'27 23 24  'WVYjwRoi$ Calcium 8.7 - 10.3 mg/dL 8.8 9.0 9.1   Hepatic Function Latest Ref Rng & Units 03/21/2017 07/18/2013 08/10/2012  Total Protein 6.5 - 8.1 g/dL 7.8 7.0 6.8  Albumin 3.5 - 5.0 g/dL 4.0 3.6 4.1  AST 15 - 41 U/L 34 26 17  ALT 14 - 54 U/L $Remo'29 31 16  'CRkDL$ Alk Phosphatase 38 -  126 U/L 95 119(H) 85  Total Bilirubin 0.3 - 1.2 mg/dL 0.9 0.3 0.4   CBC Latest Ref Rng & Units 03/30/2018 06/01/2017 03/21/2017  WBC 3.4 - 10.8 x10E3/uL 5.4 6.6 8.7  Hemoglobin 11.1 - 15.9 g/dL 14.2 14.6 14.6  Hematocrit 34.0 - 46.6 % 42.2 46.1 43.8  Platelets 150 - 450 x10E3/uL 191 187 195   Lab Results  Component Value Date   MCV 89 03/30/2018   MCV 91 06/01/2017   MCV 91.4 03/21/2017   Lab Results  Component Value Date   TSH 2.030 04/26/2018   Lipid Panel     Component Value Date/Time   CHOL 120 07/19/2013 0539   TRIG 52 07/19/2013 0539   HDL 45 07/19/2013 0539   CHOLHDL 2.7 07/19/2013 0539   VLDL 10 07/19/2013 0539   LDLCALC 65 07/19/2013 0539   RADIOLOGY: No results found.  IMPRESSION:  1. Permanent atrial fibrillation ([place])   2. OSA (obstructive sleep apnea)   3. Pacemaker  4. Hypothyroidism, unspecified type   5. Long term current use of anticoagulant   6. Apical variant hypertrophic cardiomyopathy ([place])   7. Hyperlipidemia LDL goal <70      ASSESSMENT AND PLAN:   Denise Pugh is an 86 year old Caucasian female who has a history of SVT and documented moderate left ventricle hypertrophy  with a "spade-like ventricle."  Denise Pugh had diffuse T-wave abnormalities which have been present for years. In 2003 cardiac catheterization did not demonstrate coronary obstructive disease but Denise Pugh had mild midsystolic bridging not felt to be significant in Denise Pugh LAD territory.  Denise Pugh has a history of SVT, PAF, and had developed bradycardia arrhythmia resulting in Denise Pugh permanent pacemaker implantation in 2015.  Denise Pugh was demonstrated to have persistent atrial fibrillation.  Denise Pugh has a cha2ds2vasc score of 4.  Denise Pugh underwent successful DC cardioversion on June 07, 2017.  When seen in follow-up in December 2019 Denise Pugh was back in atrial fibrillation of questionable duration and has since been in permanent atrial fibrillation. Denise Pugh had been started on digoxin for additional rate control and this has  controlled Denise Pugh prior sensation of increased heart rates. Denise Pugh ECG today shows a ventricular paced rhythm at 61 bpm with an appropriate ventricular sensed beat. Denise Pugh was found to have severe sleep apnea which only was significantly stressed with attempting to use BiPAP at home particularly with a water leak message that Denise Pugh had been receiving. Denise Pugh ultimately stopped using therapy. Denise Pugh no longer has Denise Pugh machine. However when BiPAP was utilized, Denise Pugh had significant improvement in Denise Pugh previous severe sleep apnea with almost complete resolution with an AHI of 0.6. Denise Pugh continues to be on atorvastatin for hyperlipidemia. I.Denise Pugh all in September and lipids were stable. Denise Pugh is now taking Denise Pugh previous dose of levothyroxine and 75 mcg. TSH level was 1.5 in October which may have been when Denise Pugh was on 88 mcg Denise Pugh believes Denise Pugh is feeling well on the 75 mcg dose wishes to continue this. Denise Pugh is anticoagulated with Eliquis of any bleeding. Denise Pugh is overdue for Denise Pugh yearly evaluation with Dr. Sallyanne Kuster try to get this scheduled in the next several months. 6 months for reevaluation   Troy Sine M.D., [facility] 02/06/2020 3:00 PM

## 2020-02-27 ENCOUNTER — Other Ambulatory Visit: Payer: Self-pay | Admitting: Cardiovascular Disease

## 2020-03-20 ENCOUNTER — Encounter: Payer: Self-pay | Admitting: Cardiovascular Disease

## 2020-03-20 ENCOUNTER — Ambulatory Visit (INDEPENDENT_AMBULATORY_CARE_PROVIDER_SITE_OTHER): Payer: Medicare Other | Admitting: Cardiovascular Disease

## 2020-03-20 ENCOUNTER — Other Ambulatory Visit: Payer: Self-pay

## 2020-03-20 VITALS — BP 134/79 | HR 85 | Ht 63.0 in | Wt 149.2 lb

## 2020-03-20 DIAGNOSIS — Z7901 Long term (current) use of anticoagulants: Secondary | ICD-10-CM

## 2020-03-20 DIAGNOSIS — I4821 Permanent atrial fibrillation: Secondary | ICD-10-CM | POA: Diagnosis not present

## 2020-03-20 DIAGNOSIS — I422 Other hypertrophic cardiomyopathy: Secondary | ICD-10-CM | POA: Diagnosis not present

## 2020-03-20 DIAGNOSIS — Z950 Presence of cardiac pacemaker: Secondary | ICD-10-CM

## 2020-03-20 DIAGNOSIS — I10 Essential (primary) hypertension: Secondary | ICD-10-CM

## 2020-03-20 DIAGNOSIS — E785 Hyperlipidemia, unspecified: Secondary | ICD-10-CM

## 2020-03-20 NOTE — Progress Notes (Signed)
Cardiology office note   Date:  03/20/2020   ID:  NATHANIEL YADEN, DOB [birth year], MRN [id number]  PCP:  [name], MD  Cardiologist:  [name], MD  Electrophysiologist:  None   Chief Complaint: Pacemaker follow-up  History of Present Illness:    Denise Pugh is a 86 y.o. female with a history of tachycardia-bradycardia syndrome for which she received a dual-chamber permanent pacemaker,  which we today reprogrammed VVIR due to persistent atrial fibrillation (well over a year)..  Additional medical problems include systemic hypertension, hyperlipidemia, obstructive sleep apnea and apical variant hypertrophic cardiomyopathy (which has not been associated with heart failure or ventricular arrhythmia).  She has been doing quite well.  She uses a walker due to problems with pain in her back her knees but denies exertional chest discomfort or dyspnea.  She does not have edema, orthopnea or PND.  She is not aware of any irregularity in her heart rhythm or any palpitations.  She has not had dizziness or syncope and denies falls, injuries or bleeding problems.  She also seems to be doing quite well from an emotional standpoint now, despite previous issues with depression despite all the hardships of the COVID-19 pandemic.  Device interrogation shows 100% atrial fibrillation for over 12 months.  She has good ventricular rate control at about 47% ventricular pacing.  Digoxin was added to her metoprolol since she had frequent RVR and since then rate control has improved.  Lead parameters are good and estimated generator longevity was 1.5 years (before switching to VVIR).  Her most recent lipid profile showed an acceptable LDL cholesterol level.  She does not have diabetes mellitus.  Past Medical History:  Diagnosis Date  . Arrhythmia    HX of SVT. CARDIONET MONITOR 07/15/12 TO 08/13/12  . Arthritis   . Family history of adverse reaction to anesthesia 1970s   mother  . GERD (gastroesophageal  reflux disease)   . Hyperlipidemia 08/06/11   lexiscan myoview-normal. Due to severe attenuation the study specificity and sensitivty are deminished.  . Hypertension 08/25/10   ECHO-EF 60-65%  . Hypothyroidism   . Irregular heart beat   . Shortness of breath   . Sleep apnea    SLEEP STUDY-[facility]  . Thyroid disease    Past Surgical History:  Procedure Laterality Date  . CARDIAC CATHETERIZATION  07/06/01   spade-like configuration  . CARDIOVERSION N/A 06/07/2017   Procedure: CARDIOVERSION;  Surgeon: [name], MD;  Location: [facility];  Service: Cardiovascular;  Laterality: N/A;  . CARDIOVERSION N/A 04/14/2018   Procedure: CARDIOVERSION;  Surgeon: [name], MD;  Location: [facility];  Service: Cardiovascular;  Laterality: N/A;  . DILATION AND CURETTAGE OF UTERUS    . EYE SURGERY    . KNEE ARTHROSCOPY    . PERMANENT PACEMAKER INSERTION N/A 07/20/2013   Procedure: PERMANENT PACEMAKER INSERTION;  Surgeon: [name], MD;  Location: [facility];  Service: Cardiovascular;  Laterality: N/A;     Current Meds  Medication Sig  . acetaminophen (TYLENOL) 500 MG tablet Take 500 mg by mouth every 6 (six) hours as needed for moderate pain, fever or headache.   Marland Kitchen atorvastatin (LIPITOR) 20 MG tablet TAKE ONE-HALF TABLET BY  MOUTH DAILY  . Cholecalciferol (VITAMIN D) 2000 UNITS tablet Take 2,000 Units by mouth daily.  Marland Kitchen dexlansoprazole (DEXILANT) 60 MG capsule Take 60 mg by mouth daily.  . diclofenac Sodium (VOLTAREN) 1 % GEL Apply topically as needed. 1% gel  as needed for pain  . digoxin (LANOXIN) 0.125 MG tablet Take 1 tablet (0.125 mg total) by mouth daily.  Marland Kitchen docusate sodium (COLACE) 100 MG capsule Take 100 mg by mouth 2 (two) times daily.  Marland Kitchen ELIQUIS 5 MG TABS tablet TAKE 1 TABLET BY MOUTH  TWICE DAILY  . escitalopram (LEXAPRO) 20 MG tablet TAKE 1 TABLET BY MOUTH  DAILY  . hydrochlorothiazide (MICROZIDE) 12.5 MG capsule TAKE 1 CAPSULE BY MOUTH  DAILY   . levothyroxine (SYNTHROID) 75 MCG tablet TAKE 1 TABLET BY MOUTH  DAILY BEFORE BREAKFAST  . Liniments (DEEP BLUE RELIEF EX) Apply 1 application topically daily as needed (for back, shoulder and knee pain).  . metoprolol tartrate (LOPRESSOR) 100 MG tablet Take 1 tablet (100 mg total) by mouth every morning AND 1 tablet (100 mg total) every evening.  . Multiple Vitamins-Minerals (PRESERVISION AREDS 2 PO) Take 2 tablets by mouth daily.  . mupirocin ointment (BACTROBAN) 2 % Place 1 application into the nose daily as needed (for dry nose).  Marland Kitchen nystatin (MYCOSTATIN/NYSTOP) powder Apply topically 2 (two) times daily. Pt uses as needed.  Marland Kitchen OVER THE COUNTER MEDICATION Place 1 drop into both ears daily. Organic Ear Oil Pt uses as needed  . oxybutynin (DITROPAN) 5 MG tablet Take 5 mg by mouth 2 (two) times daily.   . sodium chloride (OCEAN) 0.65 % SOLN nasal spray Place 1 spray into both nostrils as needed for congestion.  . traMADol (ULTRAM) 50 MG tablet Take 100 mg by mouth 2 (two) times daily.      Allergies:   Penicillins and Tetanus toxoid   Social History   Tobacco Use  . Smoking status: Never Smoker  . Smokeless tobacco: Never Used  Vaping Use  . Vaping Use: Never used  Substance Use Topics  . Alcohol use: No    Alcohol/week: 0.0 standard drinks  . Drug use: No     Family Hx: The patient's family history includes Diabetes in her father and mother; Heart attack in her maternal grandfather and maternal grandmother; Hypertension in her father.  ROS:   Please see the history of present illness.   All other systems are reviewed and are negative.   Prior CV studies:   The following studies were reviewed today:  Comprehensive pacemaker download performed in the office today  Labs/Other Tests and Data Reviewed:    EKG: No ECG performed today.  The intracardiac electrogram shows atrial fibrillation, mostly ventricular sensed rhythm with occasional ventricular pacing.  Recent Labs: No  results found for requested labs within last 8760 hours.   Recent Lipid Panel Lab Results  Component Value Date/Time   CHOL 120 07/19/2013 05:39 AM   TRIG 52 07/19/2013 05:39 AM   HDL 45 07/19/2013 05:39 AM   CHOLHDL 2.7 07/19/2013 05:39 AM   LDLCALC 65 07/19/2013 05:39 AM   12/15/2019 HDL 47, total cholesterol 161, triglycerides 91 Glucose 76, creatinine 0.79, TSH 1.5 Wt Readings from Last 3 Encounters:  03/20/20 149 lb 3.2 oz (67.7 kg)  02/06/20 154 lb 3.2 oz (69.9 kg)  10/30/19 154 lb 9.6 oz (70.1 kg)     Objective:    Vital Signs:  BP 134/79   Pulse 85   Ht 5\' 3"  (1.6 m)   Wt 149 lb 3.2 oz (67.7 kg)   SpO2 97%   BMI 26.43 kg/m     General: Alert, oriented x3, no distress, smiling.  Healthy subclavian pacemaker site Head: no evidence of trauma, PERRL, EOMI, no  exophtalmos or lid lag, no myxedema, no xanthelasma; normal ears, nose and oropharynx Neck: normal jugular venous pulsations and no hepatojugular reflux; brisk carotid pulses without delay and no carotid bruits Chest: clear to auscultation, no signs of consolidation by percussion or palpation, normal fremitus, symmetrical and full respiratory excursions Cardiovascular: normal position and quality of the apical impulse, irregular rhythm, normal first and second heart sounds, no murmurs, rubs or gallops Abdomen: no tenderness or distention, no masses by palpation, no abnormal pulsatility or arterial bruits, normal bowel sounds, no hepatosplenomegaly Extremities: no clubbing, cyanosis or edema; 2+ radial, ulnar and brachial pulses bilaterally; 2+ right femoral, posterior tibial and dorsalis pedis pulses; 2+ left femoral, posterior tibial and dorsalis pedis pulses; no subclavian or femoral bruits Neurological: grossly nonfocal Psych: Normal mood and affect   ASSESSMENT & PLAN:    1. Permanent atrial fibrillation (HCC)   2. Pacemaker   3. Apical variant hypertrophic cardiomyopathy (HCC)   4. Long term current use of  anticoagulant   5. Essential hypertension   6. Hyperlipidemia LDL goal <70       1. AFib: Permanent arrhythmia.  Evaluated by EP and rate control strategy recommended.  Well rate controlled on combination high-dose beta-blocker plus low-dose digoxin.  On appropriate anticoagulation. 2. PPM: Device reprogrammed VVIR today.  Hopefully this will lead to some extra battery life.  Anticipate need for generator change out in 2023. 3. Apical HCM: This may be associated with diastolic dysfunction, but she does not have evidence of left ventricular outflow tract obstruction by echo or clinically and no problems with heart failure recently. 4. Anticoagulation: Denies bleeding complications 5. HTN: Fair control 6. HLP: Her multiple musculoskeletal complaints did not improve during an atorvastatin holiday.  Current lipid parameters are in target range. 7. Depression: This was triggered by the death of her husband.  Markedly improved since I last saw her in clinic, now on the higher dose of SSRI.   Patient Instructions  Medication Instructions:  No changes *If you need a refill on your cardiac medications before your next appointment, please call your pharmacy*   Lab Work: None ordered If you have labs (blood work) drawn today and your tests are completely normal, you will receive your results only by: Marland Kitchen MyChart Message (if you have MyChart) OR . A paper copy in the mail If you have any lab test that is abnormal or we need to change your treatment, we will call you to review the results.   Testing/Procedures: None ordered   Follow-Up: At [facility], you and your health needs are our priority.  As part of our continuing mission to provide you with exceptional heart care, we have created designated Provider Care Teams.  These Care Teams include your primary Cardiologist (physician) and Advanced Practice Providers (APPs -  Physician Assistants and Nurse Practitioners) who all work together to  provide you with the care you need, when you need it.  We recommend signing up for the patient portal called "MyChart".  Sign up information is provided on this After Visit Summary.  MyChart is used to connect with patients for Virtual Visits (Telemedicine).  Patients are able to view lab/test results, encounter notes, upcoming appointments, etc.  Non-urgent messages can be sent to your provider as well.   To learn more about what you can do with MyChart, go to [email or web address].    Your next appointment:   12 month(s)  The format for your next appointment:   In Person  Provider:  Thurmon Fair, MD       Thurmon Fair, MD  03/20/2020 9:40 PM    [facility]

## 2020-03-20 NOTE — Patient Instructions (Signed)

## 2020-03-28 ENCOUNTER — Other Ambulatory Visit: Payer: Self-pay | Admitting: Cardiovascular Disease

## 2020-04-19 ENCOUNTER — Ambulatory Visit: Payer: Medicare Other | Admitting: Podiatry

## 2020-04-26 DIAGNOSIS — M71341 Other bursal cyst, right hand: Secondary | ICD-10-CM | POA: Diagnosis not present

## 2020-04-26 DIAGNOSIS — K219 Gastro-esophageal reflux disease without esophagitis: Secondary | ICD-10-CM | POA: Diagnosis not present

## 2020-04-26 DIAGNOSIS — E039 Hypothyroidism, unspecified: Secondary | ICD-10-CM | POA: Diagnosis not present

## 2020-04-26 DIAGNOSIS — E782 Mixed hyperlipidemia: Secondary | ICD-10-CM | POA: Diagnosis not present

## 2020-04-26 DIAGNOSIS — E559 Vitamin D deficiency, unspecified: Secondary | ICD-10-CM | POA: Diagnosis not present

## 2020-04-26 DIAGNOSIS — H3530 Unspecified macular degeneration: Secondary | ICD-10-CM | POA: Diagnosis not present

## 2020-04-26 DIAGNOSIS — M1611 Unilateral primary osteoarthritis, right hip: Secondary | ICD-10-CM | POA: Diagnosis not present

## 2020-04-26 DIAGNOSIS — Z23 Encounter for immunization: Secondary | ICD-10-CM | POA: Diagnosis not present

## 2020-04-26 DIAGNOSIS — M1711 Unilateral primary osteoarthritis, right knee: Secondary | ICD-10-CM | POA: Diagnosis not present

## 2020-04-26 DIAGNOSIS — I4891 Unspecified atrial fibrillation: Secondary | ICD-10-CM | POA: Diagnosis not present

## 2020-05-02 ENCOUNTER — Ambulatory Visit (INDEPENDENT_AMBULATORY_CARE_PROVIDER_SITE_OTHER): Payer: Medicare Other

## 2020-05-02 DIAGNOSIS — I495 Sick sinus syndrome: Secondary | ICD-10-CM | POA: Diagnosis not present

## 2020-05-03 ENCOUNTER — Telehealth: Payer: Self-pay | Admitting: Cardiovascular Disease

## 2020-05-03 NOTE — Telephone Encounter (Signed)
Patient is returning a call.  Please advise.  

## 2020-05-03 NOTE — Telephone Encounter (Signed)
Reviewed chart and looks like patient was scheduled to do remote check yesterday Patient will do transmission and will send to device clinic so they will be aware

## 2020-05-04 LAB — CUP PACEART REMOTE DEVICE CHECK
Battery Remaining Longevity: 20 mo
Battery Voltage: 2.93 V
Brady Statistic RA Percent Paced: 0 %
Brady Statistic RV Percent Paced: 88.97 %
Date Time Interrogation Session: 20220211180156
Implantable Lead Implant Date: 20150430
Implantable Lead Implant Date: 20150430
Implantable Lead Location: 753859
Implantable Lead Location: 753860
Implantable Lead Model: 5076
Implantable Lead Model: 5076
Implantable Pulse Generator Implant Date: 20150430
Lead Channel Impedance Value: 342 Ohm
Lead Channel Impedance Value: 418 Ohm
Lead Channel Impedance Value: 456 Ohm
Lead Channel Impedance Value: 513 Ohm
Lead Channel Pacing Threshold Amplitude: 0.75 V
Lead Channel Pacing Threshold Amplitude: 1.125 V
Lead Channel Pacing Threshold Pulse Width: 0.4 ms
Lead Channel Pacing Threshold Pulse Width: 0.4 ms
Lead Channel Sensing Intrinsic Amplitude: 0.625 mV
Lead Channel Sensing Intrinsic Amplitude: 0.625 mV
Lead Channel Sensing Intrinsic Amplitude: 18.25 mV
Lead Channel Sensing Intrinsic Amplitude: 18.25 mV
Lead Channel Setting Pacing Amplitude: 2.5 V
Lead Channel Setting Pacing Pulse Width: 0.4 ms
Lead Channel Setting Sensing Sensitivity: 4 mV

## 2020-05-06 NOTE — Telephone Encounter (Signed)
Transmission received. Normal device function.

## 2020-05-09 NOTE — Progress Notes (Signed)
Remote pacemaker transmission.   

## 2020-05-20 DIAGNOSIS — M1611 Unilateral primary osteoarthritis, right hip: Secondary | ICD-10-CM | POA: Diagnosis not present

## 2020-05-20 DIAGNOSIS — L84 Corns and callosities: Secondary | ICD-10-CM | POA: Diagnosis not present

## 2020-05-20 DIAGNOSIS — K219 Gastro-esophageal reflux disease without esophagitis: Secondary | ICD-10-CM | POA: Diagnosis not present

## 2020-05-20 DIAGNOSIS — L409 Psoriasis, unspecified: Secondary | ICD-10-CM | POA: Diagnosis not present

## 2020-05-20 DIAGNOSIS — H3530 Unspecified macular degeneration: Secondary | ICD-10-CM | POA: Diagnosis not present

## 2020-05-20 DIAGNOSIS — M1711 Unilateral primary osteoarthritis, right knee: Secondary | ICD-10-CM | POA: Diagnosis not present

## 2020-05-20 DIAGNOSIS — M71341 Other bursal cyst, right hand: Secondary | ICD-10-CM | POA: Diagnosis not present

## 2020-05-20 DIAGNOSIS — E039 Hypothyroidism, unspecified: Secondary | ICD-10-CM | POA: Diagnosis not present

## 2020-05-20 DIAGNOSIS — E559 Vitamin D deficiency, unspecified: Secondary | ICD-10-CM | POA: Diagnosis not present

## 2020-05-20 DIAGNOSIS — E782 Mixed hyperlipidemia: Secondary | ICD-10-CM | POA: Diagnosis not present

## 2020-05-29 DIAGNOSIS — H353124 Nonexudative age-related macular degeneration, left eye, advanced atrophic with subfoveal involvement: Secondary | ICD-10-CM | POA: Diagnosis not present

## 2020-05-29 DIAGNOSIS — H353112 Nonexudative age-related macular degeneration, right eye, intermediate dry stage: Secondary | ICD-10-CM | POA: Diagnosis not present

## 2020-06-15 ENCOUNTER — Other Ambulatory Visit: Payer: Self-pay | Admitting: Cardiovascular Disease

## 2020-06-19 DIAGNOSIS — L409 Psoriasis, unspecified: Secondary | ICD-10-CM | POA: Diagnosis not present

## 2020-06-19 DIAGNOSIS — M71341 Other bursal cyst, right hand: Secondary | ICD-10-CM | POA: Diagnosis not present

## 2020-06-19 DIAGNOSIS — E039 Hypothyroidism, unspecified: Secondary | ICD-10-CM | POA: Diagnosis not present

## 2020-06-19 DIAGNOSIS — M1611 Unilateral primary osteoarthritis, right hip: Secondary | ICD-10-CM | POA: Diagnosis not present

## 2020-06-19 DIAGNOSIS — H3530 Unspecified macular degeneration: Secondary | ICD-10-CM | POA: Diagnosis not present

## 2020-06-19 DIAGNOSIS — E782 Mixed hyperlipidemia: Secondary | ICD-10-CM | POA: Diagnosis not present

## 2020-06-19 DIAGNOSIS — K219 Gastro-esophageal reflux disease without esophagitis: Secondary | ICD-10-CM | POA: Diagnosis not present

## 2020-06-19 DIAGNOSIS — L84 Corns and callosities: Secondary | ICD-10-CM | POA: Diagnosis not present

## 2020-07-16 ENCOUNTER — Encounter: Payer: Self-pay | Admitting: Podiatry

## 2020-07-16 ENCOUNTER — Other Ambulatory Visit: Payer: Self-pay

## 2020-07-16 ENCOUNTER — Ambulatory Visit: Payer: Medicare Other | Admitting: Podiatry

## 2020-07-16 DIAGNOSIS — M79675 Pain in left toe(s): Secondary | ICD-10-CM

## 2020-07-16 DIAGNOSIS — Z7901 Long term (current) use of anticoagulants: Secondary | ICD-10-CM

## 2020-07-16 DIAGNOSIS — B351 Tinea unguium: Secondary | ICD-10-CM | POA: Diagnosis not present

## 2020-07-16 DIAGNOSIS — R52 Pain, unspecified: Secondary | ICD-10-CM

## 2020-07-16 DIAGNOSIS — L84 Corns and callosities: Secondary | ICD-10-CM | POA: Diagnosis not present

## 2020-07-16 DIAGNOSIS — M79674 Pain in right toe(s): Secondary | ICD-10-CM

## 2020-07-16 NOTE — Progress Notes (Signed)
  Subjective:  Patient ID: Denise Pugh, female    DOB: 12/29/1928,  MRN: [id number]  Chief Complaint  Patient presents with  . Nail Problem    Thick painful toenails  . Callouses    Painful callus left great toe    87 y.o. female presents with the above complaint. History confirmed with patient.   Objective:  Physical Exam: warm, good capillary refill, no trophic changes or ulcerative lesions, normal DP and PT pulses and normal sensory exam.  Onychomycosis x9 with thickening yellow discolored nails with subungual debris Left Foot: Callus from previous permanent nail avulsion present Assessment:   1. Pain due to onychomycosis of toenails of both feet   2. Callus of foot   3. Pain   4. Long term current use of anticoagulant      Plan:  Patient was evaluated and treated and all questions answered.  Discussed the etiology and treatment options for the condition in detail with the patient. Educated patient on the topical and oral treatment options for mycotic nails. Recommended debridement of the nails today. Sharp and mechanical debridement performed of all painful and mycotic nails today. Nails debrided in length and thickness using a nail nipper to level of comfort. Discussed treatment options including appropriate shoe gear. Follow up as needed for painful nails.  All symptomatic hyperkeratoses were safely debrided with a sterile #15 blade to patient's level of comfort without incident. We discussed preventative and palliative care of these lesions including supportive and accommodative shoegear, padding, prefabricated and custom molded accommodative orthoses, use of a pumice stone and lotions/creams daily. Silicone toe cap dispensed to offload the area   No follow-ups on file.

## 2020-08-01 ENCOUNTER — Ambulatory Visit (INDEPENDENT_AMBULATORY_CARE_PROVIDER_SITE_OTHER): Payer: Medicare Other

## 2020-08-01 DIAGNOSIS — I495 Sick sinus syndrome: Secondary | ICD-10-CM

## 2020-08-06 ENCOUNTER — Other Ambulatory Visit: Payer: Self-pay | Admitting: Cardiology

## 2020-08-06 LAB — CUP PACEART REMOTE DEVICE CHECK
Battery Remaining Longevity: 19 mo
Battery Voltage: 2.92 V
Brady Statistic RA Percent Paced: 0 %
Brady Statistic RV Percent Paced: 89.94 %
Date Time Interrogation Session: 20220516152725
Implantable Lead Implant Date: 20150430
Implantable Lead Implant Date: 20150430
Implantable Lead Location: 753859
Implantable Lead Location: 753860
Implantable Lead Model: 5076
Implantable Lead Model: 5076
Implantable Pulse Generator Implant Date: 20150430
Lead Channel Impedance Value: 342 Ohm
Lead Channel Impedance Value: 418 Ohm
Lead Channel Impedance Value: 475 Ohm
Lead Channel Impedance Value: 513 Ohm
Lead Channel Pacing Threshold Amplitude: 0.75 V
Lead Channel Pacing Threshold Amplitude: 1.125 V
Lead Channel Pacing Threshold Pulse Width: 0.4 ms
Lead Channel Pacing Threshold Pulse Width: 0.4 ms
Lead Channel Sensing Intrinsic Amplitude: 0.5 mV
Lead Channel Sensing Intrinsic Amplitude: 0.5 mV
Lead Channel Sensing Intrinsic Amplitude: 22.625 mV
Lead Channel Sensing Intrinsic Amplitude: 22.625 mV
Lead Channel Setting Pacing Amplitude: 2.5 V
Lead Channel Setting Pacing Pulse Width: 0.4 ms
Lead Channel Setting Sensing Sensitivity: 4 mV

## 2020-08-07 DIAGNOSIS — I10 Essential (primary) hypertension: Secondary | ICD-10-CM | POA: Diagnosis not present

## 2020-08-07 DIAGNOSIS — E782 Mixed hyperlipidemia: Secondary | ICD-10-CM | POA: Diagnosis not present

## 2020-08-07 DIAGNOSIS — R7301 Impaired fasting glucose: Secondary | ICD-10-CM | POA: Diagnosis not present

## 2020-08-12 DIAGNOSIS — I482 Chronic atrial fibrillation, unspecified: Secondary | ICD-10-CM | POA: Diagnosis not present

## 2020-08-12 DIAGNOSIS — G4733 Obstructive sleep apnea (adult) (pediatric): Secondary | ICD-10-CM | POA: Diagnosis not present

## 2020-08-12 DIAGNOSIS — K219 Gastro-esophageal reflux disease without esophagitis: Secondary | ICD-10-CM | POA: Diagnosis not present

## 2020-08-12 DIAGNOSIS — H3530 Unspecified macular degeneration: Secondary | ICD-10-CM | POA: Diagnosis not present

## 2020-08-12 DIAGNOSIS — E039 Hypothyroidism, unspecified: Secondary | ICD-10-CM | POA: Diagnosis not present

## 2020-08-12 DIAGNOSIS — E782 Mixed hyperlipidemia: Secondary | ICD-10-CM | POA: Diagnosis not present

## 2020-08-12 DIAGNOSIS — L409 Psoriasis, unspecified: Secondary | ICD-10-CM | POA: Diagnosis not present

## 2020-08-12 DIAGNOSIS — R7301 Impaired fasting glucose: Secondary | ICD-10-CM | POA: Diagnosis not present

## 2020-08-12 DIAGNOSIS — M1711 Unilateral primary osteoarthritis, right knee: Secondary | ICD-10-CM | POA: Diagnosis not present

## 2020-08-12 DIAGNOSIS — E559 Vitamin D deficiency, unspecified: Secondary | ICD-10-CM | POA: Diagnosis not present

## 2020-08-13 ENCOUNTER — Ambulatory Visit: Payer: Medicare Other | Admitting: Cardiovascular Disease

## 2020-08-14 ENCOUNTER — Telehealth: Payer: Self-pay | Admitting: Cardiovascular Disease

## 2020-08-14 NOTE — Telephone Encounter (Signed)
Patient was scheduled to have a sleep clinic appt 08/13/20 with [name]. The patient had to cancel. Today she called to re-schedule that appointment. The patient was concerned that she couldn't be seen until August in person. She wanted to know if she would be able to do a virtual visit or be seen sooner.  Please call

## 2020-08-14 NOTE — Telephone Encounter (Signed)
Returned call to patient who states that she can't make it to early morning appointments. Patient states that she needs to be seen sooner due to the fatigue that she is currently experiencing. Patient denies any other symptoms at this time, but states she just gives out easily. Offered patient an appointment for 6/17 with [name] but patient declined due to not being able to do morning appointments. Patient would like to speak with Dr. Evette Georges nurse to see if she can be worked in somewhere for a later appointment. Advised patient that I would forward message, patient verbalized understanding.

## 2020-08-16 NOTE — Telephone Encounter (Signed)
Attempted to contact pt. Unable to leave message as phone continues to ring.

## 2020-08-20 ENCOUNTER — Other Ambulatory Visit: Payer: Self-pay | Admitting: Cardiovascular Disease

## 2020-08-20 NOTE — Telephone Encounter (Signed)
73f, 67.7kg, Creatinine, Serum 0.790 MG/ 12/15/2019, lovw/ croitoru 03/20/20

## 2020-08-23 NOTE — Progress Notes (Signed)
Remote pacemaker transmission.   

## 2020-08-28 NOTE — Telephone Encounter (Signed)
Spoke to pt and appointment scheduled for 6/17 @ 9:40. Pt verbalized understanding.

## 2020-08-28 NOTE — Telephone Encounter (Signed)
Attempted to contact pt x 2. Unable to leave message as phone line continues to ring.

## 2020-09-06 ENCOUNTER — Ambulatory Visit: Payer: Medicare Other | Admitting: Cardiovascular Disease

## 2020-09-13 ENCOUNTER — Telehealth: Payer: Self-pay | Admitting: Cardiovascular Disease

## 2020-09-13 DIAGNOSIS — Z79899 Other long term (current) drug therapy: Secondary | ICD-10-CM

## 2020-09-13 NOTE — Telephone Encounter (Signed)
Follow Up:    Gaspar Skeeters from [employer] called. She wanted to know if you received the fax  to approve manufacture change for pt's Digoxin

## 2020-09-13 NOTE — Telephone Encounter (Signed)
Left message to call back  

## 2020-09-13 NOTE — Telephone Encounter (Signed)
Spoke with pharmacist from [facility] who report they are required to get approval to dispense medication under a different manufacturer for digoxin. She report they are switching from [employer] to Novitium.   Per Pharm D, ok to approve manufacturer for current refills but pt will need dig level and repeat BMP for future refills. Pharmacist from [facility] made aware.  Attempted to contact pt but unable to leave message.

## 2020-09-13 NOTE — Telephone Encounter (Signed)
Patient was returning call 

## 2020-09-18 NOTE — Telephone Encounter (Signed)
Attempted to call patient. Partial voice mail message left on home number but I was cut off before completing message. Attempted to call cell and there was no answer and no voice mail set up.

## 2020-09-24 NOTE — Telephone Encounter (Signed)
Spoke with [facility], aware okay for change.

## 2020-10-07 DIAGNOSIS — L03115 Cellulitis of right lower limb: Secondary | ICD-10-CM | POA: Diagnosis not present

## 2020-10-15 ENCOUNTER — Ambulatory Visit: Payer: Medicare Other | Admitting: Podiatry

## 2020-10-15 ENCOUNTER — Other Ambulatory Visit: Payer: Self-pay

## 2020-10-15 DIAGNOSIS — M79674 Pain in right toe(s): Secondary | ICD-10-CM | POA: Diagnosis not present

## 2020-10-15 DIAGNOSIS — R52 Pain, unspecified: Secondary | ICD-10-CM | POA: Diagnosis not present

## 2020-10-15 DIAGNOSIS — B351 Tinea unguium: Secondary | ICD-10-CM

## 2020-10-15 DIAGNOSIS — Z7901 Long term (current) use of anticoagulants: Secondary | ICD-10-CM | POA: Diagnosis not present

## 2020-10-15 DIAGNOSIS — L84 Corns and callosities: Secondary | ICD-10-CM

## 2020-10-15 DIAGNOSIS — M79675 Pain in left toe(s): Secondary | ICD-10-CM | POA: Diagnosis not present

## 2020-10-17 NOTE — Progress Notes (Signed)
  Subjective:  Patient ID: Denise Pugh, female    DOB: 09/13/1928,  MRN: [id number]  Chief Complaint  Patient presents with   Nail Problem    Thick painful toenails, 3 month follow up    87 y.o. female returns for follow-up with the above complaint. History confirmed with patient.   Objective:  Physical Exam: warm, good capillary refill, no trophic changes or ulcerative lesions, normal DP and PT pulses and normal sensory exam.  Onychomycosis x9 with thickening yellow discolored nails with subungual debris Left Foot: Callus from previous permanent nail avulsion present Assessment:   1. Pain due to onychomycosis of toenails of both feet   2. Callus of foot   3. Pain   4. Long term current use of anticoagulant      Plan:  Patient was evaluated and treated and all questions answered.  Discussed the etiology and treatment options for the condition in detail with the patient. Educated patient on the topical and oral treatment options for mycotic nails. Recommended debridement of the nails today. Sharp and mechanical debridement performed of all painful and mycotic nails today. Nails debrided in length and thickness using a nail nipper to level of comfort. Discussed treatment options including appropriate shoe gear. Follow up as needed for painful nails.  All symptomatic hyperkeratoses were safely debrided with a sterile #15 blade to patient's level of comfort without incident. We discussed preventative and palliative care of these lesions including supportive and accommodative shoegear, padding, prefabricated and custom molded accommodative orthoses, use of a pumice stone and lotions/creams daily. Silicone toe cap dispensed to offload the area   Return in about 3 months (around 01/15/2021) for at risk foot care .

## 2020-10-31 ENCOUNTER — Ambulatory Visit (INDEPENDENT_AMBULATORY_CARE_PROVIDER_SITE_OTHER): Payer: Medicare Other

## 2020-10-31 DIAGNOSIS — I495 Sick sinus syndrome: Secondary | ICD-10-CM | POA: Diagnosis not present

## 2020-11-03 LAB — CUP PACEART REMOTE DEVICE CHECK
Battery Remaining Longevity: 18 mo
Battery Voltage: 2.91 V
Brady Statistic RA Percent Paced: 0 %
Brady Statistic RV Percent Paced: 89.42 %
Date Time Interrogation Session: 20220811192539
Implantable Lead Implant Date: 20150430
Implantable Lead Implant Date: 20150430
Implantable Lead Location: 753859
Implantable Lead Location: 753860
Implantable Lead Model: 5076
Implantable Lead Model: 5076
Implantable Pulse Generator Implant Date: 20150430
Lead Channel Impedance Value: 342 Ohm
Lead Channel Impedance Value: 399 Ohm
Lead Channel Impedance Value: 456 Ohm
Lead Channel Impedance Value: 513 Ohm
Lead Channel Pacing Threshold Amplitude: 0.75 V
Lead Channel Pacing Threshold Amplitude: 1.125 V
Lead Channel Pacing Threshold Pulse Width: 0.4 ms
Lead Channel Pacing Threshold Pulse Width: 0.4 ms
Lead Channel Sensing Intrinsic Amplitude: 0.5 mV
Lead Channel Sensing Intrinsic Amplitude: 0.5 mV
Lead Channel Sensing Intrinsic Amplitude: 19.625 mV
Lead Channel Sensing Intrinsic Amplitude: 19.625 mV
Lead Channel Setting Pacing Amplitude: 2.5 V
Lead Channel Setting Pacing Pulse Width: 0.4 ms
Lead Channel Setting Sensing Sensitivity: 4 mV

## 2020-11-07 DIAGNOSIS — L03115 Cellulitis of right lower limb: Secondary | ICD-10-CM | POA: Diagnosis not present

## 2020-11-07 DIAGNOSIS — Z0001 Encounter for general adult medical examination with abnormal findings: Secondary | ICD-10-CM | POA: Diagnosis not present

## 2020-11-22 NOTE — Progress Notes (Signed)
Remote pacemaker transmission.   

## 2020-12-04 DIAGNOSIS — H353124 Nonexudative age-related macular degeneration, left eye, advanced atrophic with subfoveal involvement: Secondary | ICD-10-CM | POA: Diagnosis not present

## 2020-12-04 DIAGNOSIS — H353113 Nonexudative age-related macular degeneration, right eye, advanced atrophic without subfoveal involvement: Secondary | ICD-10-CM | POA: Diagnosis not present

## 2020-12-24 ENCOUNTER — Other Ambulatory Visit: Payer: Self-pay | Admitting: Cardiovascular Disease

## 2020-12-24 NOTE — Telephone Encounter (Signed)
Prescription refill request for Eliquis received. Indication:afib Last office visit:croitoru 03/20/20 Scr:0.8 08/12/20 Age: 38f Weight:67.7kg

## 2021-01-20 ENCOUNTER — Encounter: Payer: Self-pay | Admitting: Podiatry

## 2021-01-20 ENCOUNTER — Ambulatory Visit: Payer: Medicare HMO | Admitting: Podiatry

## 2021-01-20 ENCOUNTER — Other Ambulatory Visit: Payer: Self-pay

## 2021-01-20 DIAGNOSIS — L84 Corns and callosities: Secondary | ICD-10-CM

## 2021-01-20 DIAGNOSIS — R52 Pain, unspecified: Secondary | ICD-10-CM

## 2021-01-20 DIAGNOSIS — B351 Tinea unguium: Secondary | ICD-10-CM | POA: Diagnosis not present

## 2021-01-20 DIAGNOSIS — M79675 Pain in left toe(s): Secondary | ICD-10-CM

## 2021-01-20 DIAGNOSIS — M79674 Pain in right toe(s): Secondary | ICD-10-CM

## 2021-01-21 ENCOUNTER — Other Ambulatory Visit: Payer: Self-pay

## 2021-01-21 ENCOUNTER — Encounter: Payer: Self-pay | Admitting: Cardiovascular Disease

## 2021-01-21 ENCOUNTER — Ambulatory Visit: Payer: Medicare HMO | Admitting: Cardiovascular Disease

## 2021-01-21 VITALS — BP 138/64 | HR 70 | Ht 63.0 in | Wt 160.0 lb

## 2021-01-21 DIAGNOSIS — Z7901 Long term (current) use of anticoagulants: Secondary | ICD-10-CM

## 2021-01-21 DIAGNOSIS — Z950 Presence of cardiac pacemaker: Secondary | ICD-10-CM | POA: Diagnosis not present

## 2021-01-21 DIAGNOSIS — G4733 Obstructive sleep apnea (adult) (pediatric): Secondary | ICD-10-CM

## 2021-01-21 DIAGNOSIS — I4821 Permanent atrial fibrillation: Secondary | ICD-10-CM

## 2021-01-21 DIAGNOSIS — E785 Hyperlipidemia, unspecified: Secondary | ICD-10-CM | POA: Diagnosis not present

## 2021-01-21 DIAGNOSIS — I422 Other hypertrophic cardiomyopathy: Secondary | ICD-10-CM

## 2021-01-21 DIAGNOSIS — M25473 Effusion, unspecified ankle: Secondary | ICD-10-CM

## 2021-01-21 DIAGNOSIS — E039 Hypothyroidism, unspecified: Secondary | ICD-10-CM | POA: Diagnosis not present

## 2021-01-21 NOTE — Progress Notes (Signed)
Patient ID: Denise Pugh, female   DOB: Oct 27, 1928, 87 y.o.   MRN: [id number]     HPI: Denise Pugh is a 87 y.o. female who presents to the office today for a 12 month followup cardiology evaluation.  Denise Pugh has a history of SVTand moderate left ventricular hypertrophy with documented "spade-like ventricle."  She has previously documented diffuse T-wave abnormalities. A stress test in May 2013 showed breast attenuation artifact with a post stress ejection fraction of 70% without evidence for scar or ischemia. She had experienced recurrent episodes of palpitations. A cardiac monitor revealed episodes of SVT up to 185 beats per minute and her beta blocker dose was increased.  A CardioNet monitor on her increased dose of Toprol-XL 25 mg still revealed bursts of atrial tachycardia/A flutter rate at 189 beats per minute.  On  May 17 she had an episode of tachycardia palpitations and did also have episodes of bradycardia; some of her spells suggested SVT at 150  in addition to episode of accelerated idioventricular rhythm when she was sinus bradycardic.  A 2-D echo Doppler study on 09/15/2012 showed an ejection fraction of 18-56%,DJSHF 1 diastolic dysfunction and elevated LV filling pressures.  The aortic valve was as sclerotic without stenosis and there was mild/moderate central regurgitation. 10 mild mitral regurgitation and mild tricuspid regurgitation with mild pulmonary hypertension with a PA estimate pressure at 47 mm.  In April 2015, she was admitted to [facility] hospital with atrial fibrillation with a rapid ventricular response.  She also had a CardioNet monitor which had shown episodes of sinus bradycardia, as well as PAF, with rates up to 170 beats per minute with also questionable nonsustained VT versus actual fibrillation with aberrancy.  She was started on amiodarone. Her Eliquis was held, and she underwent permanent pacemaker insertion.  She was seen in August 2015 by Dr. Sallyanne Kuster for  pacemaker follow-up.  Her underlying rhythm was sinus bradycardia at 38 bpm, and she was pacing the atrium greater than 99% of the time without hardly any episodes of ventricular pacing.  She recently saw Dr. Sallyanne Kuster for one-year evaluation in October 2016.  She has both sinus node dysfunction and AV conduction abnormalities.  Her pacemaker is programmed with MVP on and she has a very long AV conduction time so as to prevent ventricular pacing.  She underwent a pacemaker evaluation with Dr. Sallyanne Kuster was in October 2017.   She has been undergoing 3 month interval device checks.  She was atrially paced and ventricular sensing with a long AV delay.  Estimated longevity is 6.5 years.  There is not been any detected atrial fibrillation or ventricular tachycardia.   She has had difficulty with low back discomfort making it difficult to walk.  She has been undergoing physical therapy.  She also received injections and also had acupuncture.  She admits to purposeful weight loss over the last year and has lost approximately 20 pounds.  She denies any episodes of chest pain.  There have been no episodes of recurrent atrial fibrillation.  She continues to be on amiodarone 200 mg daily and takes eliquis 5 mg twice a day for anticoagulation.  She continues to be on losartan HCT and metoprolol for hypertension.    She was seen by Dr. Sallyanne Kuster early March 2019.  Pacemaker interrogation showed uninterrupted atrial fibrillation since late December 2018.  Rate control was good averaging in the 80s.  She had noticed more shortness of breath and was symptomatic with AF and had  been compliant with anticoagulation she underwent cardioversion on June 07, 2017 successfully.  Postprocedure ECG showed atrially paced and ventricular sensed rhythm.  She has not noticed any major change in her symptoms following the cardioversion.  She denies chest pain.  She denies palpitations.  She has been maintained on amiodarone 400 mg daily in  addition to metoprolol 50 mg twice a day.  She is on Eliquis 5 mg twice a day.  She continues with atorvastatin for hyperlipidemia.  She has had some issues with osteoarthritis.    I saw her in June 23, 2017 at which time she was atrially paced prolonged AV conduction.  I recommended that she reduce her amiodarone to 300 mg daily.  Subsequently, amiodarone dose was reduced to 200 mg daily  I  saw her in June 2019 at which time she was maintaining sinus rhythm.  She was having some mild lower extremity edema and her blood pressure was elevated; HCTZ 12.5 mg added to her regimen.  I last saw her in December 2019 at which time she was back in atrial fibrillation.  She was on amiodarone 200 mg daily, Eliquis 5 mg twice a day, metoprolol 50 mg twice a day in addition to HCTZ.  I recommended short-term increase in amiodarone back to 200 mg twice a day.  Since I have seen her, she has had follow-up evaluations with Dr. Sallyanne Kuster, Jory Sims, NP,  andDr. Rayann Heman.  She is now considered permanent atrial fibrillation.  There was some discussion about possible consideration for ablation which she had seen Dr. Rayann Heman who did not recommend this approach.    I saw her in October 2020.  Her atrial fibrillation rate was controlled.  She was having issues significant fatigability and continues to be tired.  Of note, remotely she had been diagnosed with sleep apnea over 10 years ago and was on CPAP therapy until her husband became ill 2013 since she had a take care of him all the time.  She has not been on CPAP therapy for at least 7 years.  She was going to bed between 11 PM and midnight and typically wakes up at noon.  She does snore.  When she wakes up she is still very tired.  She has recently seen Dr. Wende Neighbors who checked laboratory and she was told that her labs were stable.  The patient was taken off amiodarone by Dr. Sallyanne Kuster and she has been on an increase metoprolol regimen at 75 mg twice a day.  During that  evaluation, her atrial fibrillation rate was increased at 113 despite taking metoprolol 75 mg twice a day.  I recommended slight titration of her a.m. dose to 100 mg  and that she continue the 75 mg evening dose.  She was on levothyroxine for hypothyroidism with laboratory being followed by Dr. [name].  I also discussed her untreated sleep apnea as a contributor to her fatigability.   She was last evaluated by me in a telemedicine visit on May 11, 2019.  At that time she was still aware that her heart rate is increased.  She denied any episodes of chest pain.  She admits to noticing more swelling in her legs.  She denies significant dietary change with reference to sodium intake.  She denies chest pain.  She does have issues with arthritis.  She admitted being more fatigued today but had the Covid vaccination the day prior to her evaluation.  With her increased heart rate I recommended further titration of  metoprolol to 100 mg twice a day.  She was tolerating Eliquis for anticoagulation.  She was having intermittent leg swelling I suggested she can take an extra 12.5 mg HCTZ on an as-needed basis.    When I last saw her in May 2021 over the past several months, Ms. Roedl has continued to have problems with no energy.  Her heart rate has remained elevated.  She sleeps poorly.  She is waking up at least 3-4 times per night for urination.  Her sleep is nonrestorative.  She believes she is snoring.  She continues to have daytime sleepiness.  She underwent laboratory by Dr. Eddie Dibbles on June 12, 2019.  Hemoglobin and hematocrit were stable at 14.6/46.3.  BUN 12 creatinine 0.79.  LFTs were normal.  Lipid studies were excellent with total cholesterol 102.  LDL cholesterol 47.  Vitamin D level was 71.5.  TSH on her current dose of levothyroxine was normal at 3.07.  Her ECG showed atrial fibrillation with a ventricular rate at 118 bpm.  Blood pressure was on the low side in I elected to initiate low-dose digoxin at 0.125  mg for additional rate control.  I was concerned about sleep apnea contributing to some of her marked fatigability with nocturia I recommended she undergo a sleep evaluation.    She underwent a sleep study on September 11, 2019 and was found to have severe sleep apnea with an AHI of 57.5/h with events being worse with supine sleep with an AHI of 64.0/h.  She had significant oxygen desaturation to a nadir of 78% and time spent less than 88% was 80.5 minutes.  CPAP titration was recommended.  She has noticed significant improvement with her palpitations and heart rate with the addition of digoxin to her medical regimen.    I last saw her on February 06, 2020 and since her prior evaluation she underwent her CPAP titration trial at [facility]. She did not tolerate CPAP and was transitioned to BiPAP. It was recommended that she initiate therapy with EPAP min of five, pressure support of four, and IPAP max of 16. She received a BiPAP machine apparently became very sick dressed due to water leak. A download was done from August 24 through September 22 and unfortunately she did not meet compliance. However she was averaging 3 hours and 18 minutes of sleep per night AHI was excellent at 0.6 and her 95th percentile pressure was 9.3/5.7. She ultimately had the machine picked up and she no longer has treatment. She tells me that Dr. Wende Neighbors recently increased her levothyroxine from 75 mcg to 88 mcg. Laboratory on 01/15/2020 showed a TSH of 1.52. She self reduced her dose back to 75 mcg which she had been on for a long time and felt well. She continues to to have permanent atrial fibrillation and is undergoing remote pacemaker checks. She is overdue with an appointment to see Dr. Sallyanne Kuster who she last saw in August 2020. She continues to be on Eliquis for anticoagulation. Creatinine is excellent at 0.79. She is on atorvastatin for hyperlipidemia, metoprolol tartrate 100 mg twice a day and digoxin 0.125 mg daily. She continued   to be on Dexilant for GERD.  During that evaluation she was overdue for yearly evaluation with Dr. Sallyanne Kuster and I recommended reevaluation.  She saw Dr. Sallyanne Kuster on March 20, 2020.  Vice interrogation showed 100% atrial fibrillation for over 12 months.  She had good rate control and was 47% ventricular pacing.  Estimated generator longevity was  1.5 years before switching her at that time to VVIR mode.   Presently, Ms. Kanner feels well.  Unfortunately her brother died 37 month ago.  She denies any chest pain or shortness of breath.  She has been living by herself.  She uses a walker for ambulation.  She has experienced some trivial left ankle swelling.  She had undergone laboratory by Dr. Wende Neighbors that her TSH was 4.1, hemoglobin A1c 5.9.  LDL cholesterol 51 with total cholesterol 112 and triglycerides 101.  Renal function was stable with creatinine of 0.8 and potassium of 4.3 and hemoglobin was 14.8 hematocrit 47.2 in May 2022.  He presents for yearly evaluation.   Past Medical History:  Diagnosis Date   Arrhythmia    HX of SVT. CARDIONET MONITOR 07/15/12 TO 08/13/12   Arthritis    Family history of adverse reaction to anesthesia 1970s   mother   GERD (gastroesophageal reflux disease)    Hyperlipidemia 08/06/11   lexiscan myoview-normal. Due to severe attenuation the study specificity and sensitivty are deminished.   Hypertension 08/25/10   ECHO-EF 60-65%   Hypothyroidism    Irregular heart beat    Shortness of breath    Sleep apnea    SLEEP STUDY-[facility]   Thyroid disease     Past Surgical History:  Procedure Laterality Date   CARDIAC CATHETERIZATION  07/06/01   spade-like configuration   CARDIOVERSION N/A 06/07/2017   Procedure: CARDIOVERSION;  Surgeon: [name], MD;  Location: [place];  Service: Cardiovascular;  Laterality: N/A;   CARDIOVERSION N/A 04/14/2018   Procedure: CARDIOVERSION;  Surgeon: [name], MD;  Location: [place];   Service: Cardiovascular;  Laterality: N/A;   DILATION AND CURETTAGE OF UTERUS     EYE SURGERY     KNEE ARTHROSCOPY     PERMANENT PACEMAKER INSERTION N/A 07/20/2013   Procedure: PERMANENT PACEMAKER INSERTION;  Surgeon: [name], MD;  Location: [place] CATH LAB;  Service: Cardiovascular;  Laterality: N/A;    Allergies  Allergen Reactions   Penicillins Other (See Comments)    Has patient had a PCN reaction causing immediate rash, facial/tongue/throat swelling, SOB or lightheadedness with hypotension: Unknown Has patient had a PCN reaction causing severe rash involving mucus membranes or skin necrosis: Unknown Has patient had a PCN reaction that required hospitalization: Unknown Has patient had a PCN reaction occurring within the last 10 years: Unknown If all of the above answers are "NO", then may proceed with Cephalosporin use.    Tetanus Toxoid Swelling    Current Outpatient Medications  Medication Sig Dispense Refill   acetaminophen (TYLENOL) 500 MG tablet Take 500 mg by mouth every 6 (six) hours as needed for moderate pain, fever or headache.      apixaban (ELIQUIS) 5 MG TABS tablet TAKE 1 TABLET BY MOUTH  TWICE DAILY 180 tablet 1   atorvastatin (LIPITOR) 20 MG tablet TAKE ONE-HALF TABLET BY  MOUTH DAILY 45 tablet 8   buPROPion (WELLBUTRIN) 75 MG tablet Take 75 mg by mouth 2 (two) times daily.     Cholecalciferol (VITAMIN D) 2000 UNITS tablet Take 2,000 Units by mouth daily.     dexlansoprazole (DEXILANT) 60 MG capsule Take 60 mg by mouth daily.     diclofenac Sodium (VOLTAREN) 1 % GEL Apply topically as needed. 1% gel as needed for pain     digoxin (LANOXIN) 0.125 MG tablet TAKE 1 TABLET BY MOUTH  DAILY 90 tablet 3   docusate sodium (COLACE) 100 MG  capsule Take 100 mg by mouth 2 (two) times daily.     escitalopram (LEXAPRO) 20 MG tablet TAKE 1 TABLET BY MOUTH  DAILY 90 tablet 3   hydrochlorothiazide (MICROZIDE) 12.5 MG capsule TAKE 1 CAPSULE BY MOUTH  DAILY 90 capsule 3    levothyroxine (SYNTHROID) 75 MCG tablet TAKE 1 TABLET BY MOUTH  DAILY BEFORE BREAKFAST 90 tablet 3   Liniments (DEEP BLUE RELIEF EX) Apply 1 application topically daily as needed (for back, shoulder and knee pain).     metoprolol tartrate (LOPRESSOR) 100 MG tablet TAKE 1 TABLET BY MOUTH  TWICE DAILY IN THE MORNING  AND IN THE EVENING 180 tablet 3   Multiple Vitamins-Minerals (PRESERVISION AREDS 2 PO) Take 2 tablets by mouth daily.     mupirocin ointment (BACTROBAN) 2 % Place 1 application into the nose daily as needed (for dry nose).     nystatin (MYCOSTATIN/NYSTOP) powder Apply topically 2 (two) times daily. Pt uses as needed.     OVER THE COUNTER MEDICATION Place 1 drop into both ears daily. Organic Ear Oil Pt uses as needed     oxybutynin (DITROPAN) 5 MG tablet Take 5 mg by mouth 2 (two) times daily.      sodium chloride (OCEAN) 0.65 % SOLN nasal spray Place 1 spray into both nostrils as needed for congestion.     traMADol (ULTRAM) 50 MG tablet Take 100 mg by mouth 2 (two) times daily.      No current facility-administered medications for this visit.   Social history is notable in that she is now. Her husband did suffer a CVA and has been having more difficulty at home. She has one child, 2 step sons, one grandchild and 2 great-grandchildren. There is no tobacco or alcohol use. She does admit to frequent anxiety.  ROS General: Negative; No fevers, chills, or night sweats; positive for fatigue HEENT: Negative; No changes in vision or hearing, sinus congestion, difficulty swallowing Pulmonary: Negative; No cough, wheezing, shortness of breath, hemoptysis Cardiovascular: see HPI GI: Negative; No nausea, vomiting, diarrhea, or abdominal pain GU: Negative; No dysuria, hematuria, or difficulty voiding Musculoskeletal: Positive for low back pain and osteoarthritis Hematologic/Oncology: Negative; no easy bruising, bleeding Endocrine: Negative; no heat/cold intolerance; no diabetes Neuro:  Negative; no changes in balance, headaches Skin: Negative; No rashes or skin lesions Psychiatric: Negative; No behavioral problems, depression Sleep: Positive for snoring, fatigue, daytime sleepiness, remote history of sleep apnea and previously used CPAP daytime sleepiness, hypersomnolence, bruxism, restless legs, hypnogognic hallucinations, no cataplexy Other comprehensive 14 point system review is negative.   PE BP 138/64   Pulse 70   Ht _0  (1.6 m)   Wt 160 lb (72.6 kg)   SpO2 97%   BMI 28.34 kg/m    Repeat blood pressure by me was 130/70  Wt Readings from Last 3 Encounters:  01/21/21 160 lb (72.6 kg)  03/20/20 149 lb 3.2 oz (67.7 kg)  02/06/20 154 lb 3.2 oz (69.9 kg)  2016: Weight 181 pounds    General: Alert, oriented, no distress.  Skin: normal turgor, no rashes, warm and dry HEENT: Normocephalic, atraumatic. Pupils equal round and reactive to light; sclera anicteric; extraocular muscles intact;  Nose without nasal septal hypertrophy Mouth/Parynx benign; Mallinpatti scale 3 Neck: No JVD, no carotid bruits; normal carotid upstroke Lungs: clear to ausculatation and percussion; no wheezing or rales Chest wall: without tenderness to palpitation Heart: PMI not displaced, RRR, s1 s2 normal, 1/6 systolic murmur, no diastolic murmur, no rubs,  gallops, thrills, or heaves Abdomen: soft, nontender; no hepatosplenomehaly, BS+; abdominal aorta nontender and not dilated by palpation. Back: no CVA tenderness Pulses 2+ Musculoskeletal: full range of motion, normal strength, no joint deformities Extremities: no clubbing cyanosis or edema, Homan's sign negative  Neurologic: grossly nonfocal; Cranial nerves grossly wnl Psychologic: Normal mood and affect    November 1, 2022ECG (independently read by me):  Ventricular paced at 70  February 06, 2020 ECG (independently read by me): Ventricular paced rhythm at 61 bpm with appropriate pacing as well as an appropriate isolated  sense.  August 2021 ECG (independently read by me): Ventricular paced rhythm at 61  May 2021 ECG (independently read by me): Atrial fibrillation with a ventricular rate of 118 bpm.  Incomplete right bundle branch block.  Previously noted ST wave abnormality laterally.  October 2020 ECG (independently read by me): Atrial fibrillation at 113; IRBBB, NSSTT changes  December 2019 ECG (independently read by me): Atrial fibrillation at 83 bpm with  nonspecific ST-T changes . The last 3 beats of the ECG show ventricular pacing and 1 beat with AV pacing   September 14, 2017 ECG (independently read by me): Atrially paced rhythm at 64 bpm.  Prolonged AV conduction with PR interval of _0 ms.  Inferolateral ST wave abnormality.  TC interval 443 ms.  June 23, 2017 ECG (independently read by me): Atrially paced with prolonged AV conduction with a PR interval at 276.  Ventricular rate 63.  Anterolateral T wave changes  October 2018 ECG (independently read by me): Atrially paced rhythm at 69 bpm.  Prolonged AV conduction at 298 ms. ST-T abnormality anterolaterally.  January 2018 ECG (independently read by me): Atrial paced rhythm at 68 bpm.  Prolonged AV conduction at 300 ms. ,  Incomplete right bundle-branch block Anterolateral ST-T changes  April 2017 ECG (independently read by me): Sinus rhythm with markedly prolonged AV interval at ~ 440 msec.  Right bundle branch block  November 2016 ECG (independently read by me): 100% atrial pacing with a markedly prolonged PR interval at 480 ms.  Ventricular sensing.  Incomplete right bundle branch block.  ECG (independently read by me): Atrial paced rhythm at 86 bpm with prolonged PR segment.  T-wave inversion  December 2015 ECG (independently read by me): Atrially paced rhythm with ventricular sensing with incomplete right bundle branch block.  Previously noted T-wave abnormalities  07/24/2013 ECG: Atrial lead paced rhythm.  Mild RV conduction delay.  Diffuse  T-wave inversion has been present previously  Prior ECG today  (independently read by me): Bradycardia at 48 beats per minute. RV conduction delay. Previously noted diffuse inferior and anterolateral T wave inversion  Prior ECG of 01/20/2013: sinus bradycardia at 55 beats per minute. Previously noted diffuse T-wave inversion in leads 59F, V2 through V6. Mild RV conduction delay.  LABS: BMP Latest Ref Rng & Units 03/30/2018 06/01/2017 03/21/2017  Glucose 65 - 99 mg/dL 67 82 90  BUN 8 - 27 mg/dL _1 Creatinine 0.57 - 1.00 mg/dL 0.98 1.36(H) 0.94  BUN/Creat Ratio 12 - _2 -  Sodium 134 - 144 mmol/L 134 137 134(L)  Potassium 3.5 - 5.2 mmol/L 3.8 5.2 3.4(L)  Chloride 96 - 106 mmol/L 91(L) 97 96(L)  CO2 20 - 29 mmol/L _3 Calcium 8.7 - 10.3 mg/dL 8.8 9.0 9.1   Hepatic Function Latest Ref Rng & Units 03/21/2017 07/18/2013 08/10/2012  Total Protein 6.5 - 8.1 g/dL 7.8 7.0 6.8  Albumin  3.5 - 5.0 g/dL 4.0 3.6 4.1  AST 15 - 41 U/L 34 26 17  ALT 14 - 54 U/L _0 Alk Phosphatase 38 - 126 U/L 95 119(H) 85  Total Bilirubin 0.3 - 1.2 mg/dL 0.9 0.3 0.4   CBC Latest Ref Rng & Units 03/30/2018 06/01/2017 03/21/2017  WBC 3.4 - 10.8 x10E3/uL 5.4 6.6 8.7  Hemoglobin 11.1 - 15.9 g/dL 14.2 14.6 14.6  Hematocrit 34.0 - 46.6 % 42.2 46.1 43.8  Platelets 150 - 450 x10E3/uL 191 187 195   Lab Results  Component Value Date   MCV 89 03/30/2018   MCV 91 06/01/2017   MCV 91.4 03/21/2017   Lab Results  Component Value Date   TSH 2.030 04/26/2018   Lipid Panel     Component Value Date/Time   CHOL 120 07/19/2013 0539   TRIG 52 07/19/2013 0539   HDL 45 07/19/2013 0539   CHOLHDL 2.7 07/19/2013 0539   VLDL 10 07/19/2013 0539   LDLCALC 65 07/19/2013 0539   RADIOLOGY: No results found.  IMPRESSION:  1. Permanent atrial fibrillation ([place])   2. Long term current use of anticoagulant   3. Pacemaker   4. Hyperlipidemia LDL goal <70   5. Ankle edema   6. OSA (obstructive sleep apnea):  returned CPAP   7. Hypothyroidism, unspecified type   8. Apical variant hypertrophic cardiomyopathy ([place])      ASSESSMENT AND PLAN:   Ms. Basista is an 87 year old female who has a history of SVT and documented moderate left ventricle hypertrophy  with a "spade-like ventricle."  She had diffuse T-wave abnormalities which have been present for years. In 2003 cardiac catheterization did not demonstrate coronary obstructive disease but she had mild midsystolic bridging not felt to be significant in her LAD territory.  She has a history of SVT, PAF, and had developed bradycardia arrhythmia resulting in her permanent pacemaker implantation in 2015.  She was demonstrated to have persistent atrial fibrillation.  She has a cha2ds2vasc score of 4.  She underwent successful DC cardioversion on June 07, 2017.  When seen in follow-up in December 2019 she was back in atrial fibrillation of questionable duration and has since been in permanent atrial fibrillation. She had been started on digoxin for additional rate control and this has controlled her prior sensation of increased heart rates.  She is followed by Dr. Sallyanne Kuster for her pacemaker.  In December 2029, her mode was switched to VVIR.  Prior to switching her mode, her estimated generator longevity was 1.5 years.  Her EKG today continues to show reticular paced rhythm at 70 bpm with her underlying atrial fibrillation.  Her blood pressure today is stable at 130/70 when checked by me.  She is not having any chest pain or shortness of breath.  She uses a walker to aid with ambulation.  She has experienced some trivial ankle edema.  She continues to be on risk 5 mg for anticoagulation.  She is on HCTZ 12.5 mg, metoprolol tartrate 100 mg twice a day in addition to digoxin 0.125 mg daily for rate control blood pressure and edema control.  She continues to be on atorvastatin 20 mg for hyperlipidemia with recent LDL cholesterol at 51.  She is on levothyroxine at 75 mcg and  recent TSH was normal at 4.1.  I have recommended that she have a follow-up evaluation with Dr. Sallyanne Kuster since she will be approaching 1 year evaluation and last year estimated longevity before switching to VVIR mode was  only 1.5 years.  I will see her in 1 year for follow-up evaluation or sooner as needed.   Troy Sine M.D., [facility] 01/23/2021 11:34 AM

## 2021-01-21 NOTE — Patient Instructions (Signed)
Medication Instructions:  Continue current medications. No changes.  *If you need a refill on your cardiac medications before your next appointment, please call your pharmacy*   Follow-Up: At [facility], you and your health needs are our priority.  As part of our continuing mission to provide you with exceptional heart care, we have created designated Provider Care Teams.  These Care Teams include your primary Cardiologist (physician) and Advanced Practice Providers (APPs -  Physician Assistants and Nurse Practitioners) who all work together to provide you with the care you need, when you need it.  We recommend signing up for the patient portal called "MyChart".  Sign up information is provided on this After Visit Summary.  MyChart is used to connect with patients for Virtual Visits (Telemedicine).  Patients are able to view lab/test results, encounter notes, upcoming appointments, etc.  Non-urgent messages can be sent to your provider as well.   To learn more about what you can do with MyChart, go to [email or web address].    Your next appointment:   12 month(s)  The format for your next appointment:   In Person  Provider:   [name], MD

## 2021-01-23 ENCOUNTER — Encounter: Payer: Self-pay | Admitting: Cardiovascular Disease

## 2021-01-23 NOTE — Progress Notes (Signed)
  Subjective:  Patient ID: Denise Pugh, female    DOB: [birth year],  MRN: [id number]  87 y.o. female presents with chief concern of callus left hallux and painful thick toenails that are difficult to trim. Pain interferes with ambulation. Aggravating factors include wearing enclosed shoe gear. Pain is relieved with periodic professional debridement.  Patient notes no new pedal problems on today's visit.  PCP is [name], MD , and last visit was 08/12/2020.  Allergies  Allergen Reactions   Penicillins Other (See Comments)    Has patient had a PCN reaction causing immediate rash, facial/tongue/throat swelling, SOB or lightheadedness with hypotension: Unknown Has patient had a PCN reaction causing severe rash involving mucus membranes or skin necrosis: Unknown Has patient had a PCN reaction that required hospitalization: Unknown Has patient had a PCN reaction occurring within the last 10 years: Unknown If all of the above answers are "NO", then may proceed with Cephalosporin use.    Tetanus Toxoid Swelling    Review of Systems: Negative except as noted in the HPI.   Objective:  Vascular Examination: CFT <3 seconds b/l LE. Palpable pedal pulses b/l LE. Lower extremity skin temperature gradient within normal limits. No edema noted b/l LE.  Neurological Examination: Sensation grossly intact b/l with 10 gram monofilament. Vibratory sensation intact b/l.   Dermatological Examination: Pedal integument with normal turgor, texture and tone b/l LE. No open wounds b/l. No interdigital macerations b/l. Toenails 1-5 b/l elongated, thickened, discolored with subungual debris. +Tenderness with dorsal palpation of nailplates. Hyperkeratotic lesion(s) noted L hallux and R hallux. Toenails 1-5 b/l elongated, discolored, dystrophic, thickened, crumbly with subungual debris and tenderness to dorsal palpation..   Musculoskeletal Examination: Normal muscle strength 5/5 to all lower extremity muscle  groups bilaterally. Hammertoe(s) noted to the both feet.  Radiographs: None Assessment:   1. Pain due to onychomycosis of toenails of both feet   2. Callus of foot   3. Pain    Plan:  -Examined patient. -No new findings. No new orders. -Toenails x 9  were debrided in length and girth with sterile nail nippers and dremel without iatrogenic bleeding.  -Callus(es) right foot and L hallux pared utilizing sterile scalpel blade without complication or incident. Total number debrided =1. -Patient/POA to call should there be question/concern in the interim.  Return in about 3 months (around 04/22/2021).  Marzetta Board, DPM

## 2021-01-30 ENCOUNTER — Ambulatory Visit (INDEPENDENT_AMBULATORY_CARE_PROVIDER_SITE_OTHER): Payer: Medicare HMO

## 2021-01-30 DIAGNOSIS — I495 Sick sinus syndrome: Secondary | ICD-10-CM

## 2021-01-31 LAB — CUP PACEART REMOTE DEVICE CHECK
Battery Remaining Longevity: 14 mo
Battery Voltage: 2.9 V
Brady Statistic RA Percent Paced: 0 %
Brady Statistic RV Percent Paced: 89.7 %
Date Time Interrogation Session: 20221111162254
Implantable Lead Implant Date: 20150430
Implantable Lead Implant Date: 20150430
Implantable Lead Location: 753859
Implantable Lead Location: 753860
Implantable Lead Model: 5076
Implantable Lead Model: 5076
Implantable Pulse Generator Implant Date: 20150430
Lead Channel Impedance Value: 380 Ohm
Lead Channel Impedance Value: 437 Ohm
Lead Channel Impedance Value: 475 Ohm
Lead Channel Impedance Value: 532 Ohm
Lead Channel Pacing Threshold Amplitude: 0.75 V
Lead Channel Pacing Threshold Amplitude: 1.125 V
Lead Channel Pacing Threshold Pulse Width: 0.4 ms
Lead Channel Pacing Threshold Pulse Width: 0.4 ms
Lead Channel Sensing Intrinsic Amplitude: 0.5 mV
Lead Channel Sensing Intrinsic Amplitude: 0.5 mV
Lead Channel Sensing Intrinsic Amplitude: 17.875 mV
Lead Channel Sensing Intrinsic Amplitude: 17.875 mV
Lead Channel Setting Pacing Amplitude: 2.5 V
Lead Channel Setting Pacing Pulse Width: 0.4 ms
Lead Channel Setting Sensing Sensitivity: 4 mV

## 2021-02-10 ENCOUNTER — Other Ambulatory Visit: Payer: Self-pay | Admitting: Cardiovascular Disease

## 2021-02-10 NOTE — Progress Notes (Signed)
Remote pacemaker transmission.   

## 2021-02-11 ENCOUNTER — Telehealth: Payer: Self-pay | Admitting: Cardiovascular Disease

## 2021-02-11 NOTE — Telephone Encounter (Signed)
Patient is calling stating she is needing all her medications we prescribe for her be sent to [place], [place]. Her insurance has now been switched so this is the pharmacy she will have moving forward. She states they faxed prescription request a week ago, but never received anything back. She is completely out of metoprolol and would like 90 day supplies of all the prescriptions sent ASAP.

## 2021-02-12 ENCOUNTER — Other Ambulatory Visit: Payer: Self-pay

## 2021-02-12 DIAGNOSIS — R7301 Impaired fasting glucose: Secondary | ICD-10-CM | POA: Diagnosis not present

## 2021-02-12 DIAGNOSIS — I482 Chronic atrial fibrillation, unspecified: Secondary | ICD-10-CM | POA: Diagnosis not present

## 2021-02-12 DIAGNOSIS — E782 Mixed hyperlipidemia: Secondary | ICD-10-CM | POA: Diagnosis not present

## 2021-02-12 DIAGNOSIS — E039 Hypothyroidism, unspecified: Secondary | ICD-10-CM | POA: Diagnosis not present

## 2021-02-12 MED ORDER — METOPROLOL TARTRATE 100 MG PO TABS: ORAL_TABLET | ORAL | 3 refills | Status: DC

## 2021-02-12 MED ORDER — APIXABAN 5 MG PO TABS: 5.0000 mg | ORAL_TABLET | Freq: Two times a day (BID) | ORAL | 1 refills | Status: DC

## 2021-02-12 MED ORDER — HYDROCHLOROTHIAZIDE 12.5 MG PO CAPS: 12.5000 mg | ORAL_CAPSULE | Freq: Every day | ORAL | 3 refills | Status: DC

## 2021-02-12 MED ORDER — DIGOXIN 125 MCG PO TABS: 125.0000 ug | ORAL_TABLET | Freq: Every day | ORAL | 3 refills | Status: DC

## 2021-02-12 MED ORDER — ATORVASTATIN CALCIUM 20 MG PO TABS: 10.0000 mg | ORAL_TABLET | Freq: Every day | ORAL | 8 refills | Status: DC

## 2021-02-12 NOTE — Telephone Encounter (Signed)
Prescription refill request for Eliquis received. Indication:Afib Last office visit:11/22 Scr:0.8 Age: 87 Weight:72.6 kg  Prescription refilled

## 2021-02-12 NOTE — Telephone Encounter (Signed)
All medications sent electronically to desired pharmacy. Called patient to notify that the prescriptions were sent but I was unable to leave a voicemail.

## 2021-02-17 DIAGNOSIS — E039 Hypothyroidism, unspecified: Secondary | ICD-10-CM | POA: Diagnosis not present

## 2021-02-17 DIAGNOSIS — E782 Mixed hyperlipidemia: Secondary | ICD-10-CM | POA: Diagnosis not present

## 2021-02-17 DIAGNOSIS — R7301 Impaired fasting glucose: Secondary | ICD-10-CM | POA: Diagnosis not present

## 2021-02-17 DIAGNOSIS — E559 Vitamin D deficiency, unspecified: Secondary | ICD-10-CM | POA: Diagnosis not present

## 2021-02-17 DIAGNOSIS — R32 Unspecified urinary incontinence: Secondary | ICD-10-CM | POA: Diagnosis not present

## 2021-02-17 DIAGNOSIS — K219 Gastro-esophageal reflux disease without esophagitis: Secondary | ICD-10-CM | POA: Diagnosis not present

## 2021-02-17 DIAGNOSIS — F331 Major depressive disorder, recurrent, moderate: Secondary | ICD-10-CM | POA: Diagnosis not present

## 2021-02-17 DIAGNOSIS — M1711 Unilateral primary osteoarthritis, right knee: Secondary | ICD-10-CM | POA: Diagnosis not present

## 2021-02-17 DIAGNOSIS — I482 Chronic atrial fibrillation, unspecified: Secondary | ICD-10-CM | POA: Diagnosis not present

## 2021-03-05 DIAGNOSIS — M1712 Unilateral primary osteoarthritis, left knee: Secondary | ICD-10-CM | POA: Insufficient documentation

## 2021-03-05 DIAGNOSIS — M1711 Unilateral primary osteoarthritis, right knee: Secondary | ICD-10-CM | POA: Insufficient documentation

## 2021-04-04 DIAGNOSIS — M1711 Unilateral primary osteoarthritis, right knee: Secondary | ICD-10-CM | POA: Diagnosis not present

## 2021-04-04 DIAGNOSIS — M1712 Unilateral primary osteoarthritis, left knee: Secondary | ICD-10-CM | POA: Diagnosis not present

## 2021-04-04 DIAGNOSIS — M170 Bilateral primary osteoarthritis of knee: Secondary | ICD-10-CM | POA: Diagnosis not present

## 2021-04-07 ENCOUNTER — Encounter: Payer: Self-pay | Admitting: Cardiovascular Disease

## 2021-04-07 ENCOUNTER — Other Ambulatory Visit: Payer: Self-pay

## 2021-04-07 ENCOUNTER — Ambulatory Visit (INDEPENDENT_AMBULATORY_CARE_PROVIDER_SITE_OTHER): Payer: Medicare HMO | Admitting: Cardiovascular Disease

## 2021-04-07 VITALS — BP 160/70 | HR 81 | Ht 63.0 in | Wt 159.0 lb

## 2021-04-07 DIAGNOSIS — Z950 Presence of cardiac pacemaker: Secondary | ICD-10-CM

## 2021-04-07 DIAGNOSIS — D6869 Other thrombophilia: Secondary | ICD-10-CM | POA: Diagnosis not present

## 2021-04-07 DIAGNOSIS — I10 Essential (primary) hypertension: Secondary | ICD-10-CM

## 2021-04-07 DIAGNOSIS — I422 Other hypertrophic cardiomyopathy: Secondary | ICD-10-CM

## 2021-04-07 DIAGNOSIS — I4821 Permanent atrial fibrillation: Secondary | ICD-10-CM

## 2021-04-07 DIAGNOSIS — E785 Hyperlipidemia, unspecified: Secondary | ICD-10-CM

## 2021-04-07 NOTE — Patient Instructions (Signed)

## 2021-04-07 NOTE — Progress Notes (Signed)
Cardiology office note   Date:  04/07/2021   ID:  Denise Pugh, DOB 12/03/1928, MRN [id number]  PCP:  [name], MD  Cardiologist:  [name], MD  Electrophysiologist:  None   Chief Complaint: Pacemaker follow-up  History of Present Illness:    Denise Pugh is a 88 y.o. female with a history of tachycardia-bradycardia syndrome for which she received a dual-chamber permanent pacemaker,  which we today reprogrammed VVIR due to persistent atrial fibrillation (well over a year).  Additional medical problems include systemic hypertension, hyperlipidemia, obstructive sleep apnea and apical variant hypertrophic cardiomyopathy (which has not been associated with heart failure or ventricular arrhythmia).  He continues to live independently.  While cleaning her house, she slid off the bed and had a hard time getting up off the floor.  She had to call for assistance.  She did not injure herself.  There was no head impact.  She has not had any other injuries or bleeding problems.  The patient specifically denies any chest pain at rest exertion, dyspnea at rest or with exertion, orthopnea, paroxysmal nocturnal dyspnea, syncope, palpitations, focal neurological deficits, intermittent claudication, lower extremity edema, unexplained weight gain, cough, hemoptysis or wheezing.  Her blood pressure is high today, but when she was seen in the Coumadin clinic it was normal and she reports feeling nervous.  Typically at home her systolic blood pressure is in the low 130s.  Device interrogation shows normal pacemaker function.  She has been in persistent atrial fibrillation for a couple of years now.  She has good ventricular rate control and roughly 88% ventricular pacing.  In the past we had to add digoxin to metoprolol since she had frequent episodes of RVR, but none has been seen since her last visit.  Estimated generator longevity is about 13 months.  Lead parameters are good (she has [employer]tr[employer] 5076  leads).  She has an MRI conditional device.  Device interrogation shows 100% atrial fibrillation for over 12 months.  She has good ventricular rate control at about 47% ventricular pacing.  Digoxin was added to her metoprolol since she had frequent RVR and since then rate control has improved.  Lead parameters are good and estimated generator longevity was 1.5 years (before switching to VVIR).  Her most recent lipid profile showed an acceptable LDL cholesterol level.  She does not have diabetes mellitus.  Past Medical History:  Diagnosis Date   Arrhythmia    HX of SVT. CARDIONET MONITOR 07/15/12 TO 08/13/12   Arthritis    Family history of adverse reaction to anesthesia 1970s   mother   GERD (gastroesophageal reflux disease)    Hyperlipidemia 08/06/11   lexiscan myoview-normal. Due to severe attenuation the study specificity and sensitivty are deminished.   Hypertension 08/25/10   ECHO-EF 60-65%   Hypothyroidism    Irregular heart beat    Shortness of breath    Sleep apnea    SLEEP STUDY-[facility]   Thyroid disease    Past Surgical History:  Procedure Laterality Date   CARDIAC CATHETERIZATION  07/06/01   spade-like configuration   CARDIOVERSION N/A 06/07/2017   Procedure: CARDIOVERSION;  Surgeon: [name], MD;  Location: [place];  Service: Cardiovascular;  Laterality: N/A;   CARDIOVERSION N/A 04/14/2018   Procedure: CARDIOVERSION;  Surgeon: [name], MD;  Location: [place];  Service: Cardiovascular;  Laterality: N/A;   [place]  ARTHROSCOPY     PERMANENT PACEMAKER INSERTION N/A 07/20/2013   Procedure: PERMANENT PACEMAKER INSERTION;  Surgeon: [name], MD;  Location: [facility] CATH [facility];  Service: Cardiovascular;  Laterality: N/A;     Current Meds  Medication Sig   acetaminophen (TYLENOL) 500 MG tablet Take 500 mg by mouth every 6 (six) hours as needed for moderate pain, fever or headache.     apixaban (ELIQUIS) 5 MG TABS tablet Take 1 tablet (5 mg total) by mouth 2 (two) times daily.   atorvastatin (LIPITOR) 20 MG tablet Take 0.5 tablets (10 mg total) by mouth daily.   buPROPion (WELLBUTRIN) 75 MG tablet Take 75 mg by mouth 2 (two) times daily.   Cholecalciferol (VITAMIN D) 2000 UNITS tablet Take 2,000 Units by mouth daily.   diclofenac Sodium (VOLTAREN) 1 % GEL Apply topically as needed. 1% gel as needed for pain   digoxin (LANOXIN) 0.125 MG tablet Take 1 tablet (125 mcg total) by mouth daily.   escitalopram (LEXAPRO) 20 MG tablet TAKE 1 TABLET BY MOUTH  DAILY   esomeprazole (NEXIUM) 40 MG capsule Take 40 mg by mouth daily.   hydrochlorothiazide (MICROZIDE) 12.5 MG capsule Take 1 capsule (12.5 mg total) by mouth daily.   levothyroxine (SYNTHROID) 75 MCG tablet TAKE 1 TABLET BY MOUTH  DAILY BEFORE BREAKFAST   metoprolol tartrate (LOPRESSOR) 100 MG tablet TAKE 1 TABLET BY MOUTH  TWICE DAILY IN THE MORNING  AND IN THE EVENING   Multiple Vitamins-Minerals (PRESERVISION AREDS 2 PO) Take 2 tablets by mouth daily.   oxybutynin (DITROPAN) 5 MG tablet Take 5 mg by mouth 2 (two) times daily.    traMADol (ULTRAM) 50 MG tablet Take 100 mg by mouth 2 (two) times daily.      Allergies:   Penicillins and Tetanus toxoid   Social History   Tobacco Use   Smoking status: Never   Smokeless tobacco: Never  Vaping Use   Vaping Use: Never used  Substance Use Topics   Alcohol use: No    Alcohol/week: 0.0 standard drinks   Drug use: No     Family Hx: The patient's family history includes Diabetes in her father and mother; Heart attack in her maternal grandfather and maternal grandmother; Hypertension in her father.  ROS:   Please see the history of present illness.   All other systems are reviewed and are negative.   Prior CV studies:   The following studies were reviewed today:  Comprehensive pacemaker download performed in the office today  Labs/Other Tests and Data Reviewed:     EKG: Was not performed today.  Intracardiac electrogram shows atrial sensed (atrial fibrillation), ventricular paced rhythm.  Recent Labs: No results found for requested labs within last 8760 hours.   08/07/2020 TSH 4.10, creatinine 0.8, hemoglobin 14.8, hemoglobin A1c 5.9% 08/07/2020 Cholesterol 112, HDL 42, LDL 51  Recent Lipid Panel Lab Results  Component Value Date/Time   CHOL 120 07/19/2013 05:39 AM   TRIG 52 07/19/2013 05:39 AM   HDL 45 07/19/2013 05:39 AM   CHOLHDL 2.7 07/19/2013 05:39 AM   LDLCALC 65 07/19/2013 05:39 AM   08/07/2020  cholesterol 112, HDL 42, LDL 51, triglycerides 101  Wt Readings from Last 3 Encounters:  04/07/21 159 lb (72.1 kg)  01/21/21 160 lb (72.6 kg)  03/20/20 149 lb 3.2 oz (67.7 kg)     Objective:    Vital Signs:  BP (!) 160/70 (BP Location: Left Arm, Patient Position: Sitting, Cuff Size: Large)    Pulse  81    Ht 5\' 3"  (1.6 m)    Wt 159 lb (72.1 kg)    SpO2 98%    BMI 28.17 kg/m      General: Alert, oriented x3, no distress, healthy left subclavian pacemaker site. Head: no evidence of trauma, PERRL, EOMI, no exophtalmos or lid lag, no myxedema, no xanthelasma; normal ears, nose and oropharynx Neck: normal jugular venous pulsations and no hepatojugular reflux; brisk carotid pulses without delay and no carotid bruits Chest: clear to auscultation, no signs of consolidation by percussion or palpation, normal fremitus, symmetrical and full respiratory excursions Cardiovascular: normal position and quality of the apical impulse, regular rhythm, normal first and second heart sounds, no murmurs, rubs or gallops Abdomen: no tenderness or distention, no masses by palpation, no abnormal pulsatility or arterial bruits, normal bowel sounds, no hepatosplenomegaly Extremities: no clubbing, cyanosis or edema; 2+ radial, ulnar and brachial pulses bilaterally; 2+ right femoral, posterior tibial and dorsalis pedis pulses; 2+ left femoral, posterior tibial and  dorsalis pedis pulses; no subclavian or femoral bruits Neurological: grossly nonfocal Psych: Normal mood and affect    ASSESSMENT & PLAN:    1. Permanent atrial fibrillation ([place])   2. Pacemaker   3. Apical variant hypertrophic cardiomyopathy ([place])   4. Acquired thrombophilia ([place])   5. Essential hypertension   6. Hyperlipidemia LDL goal <70        AFib: Permanent arrhythmia, well rate controlled, appropriately anticoagulated.  CHA2DS2-VASc 4-6 (age 40, gender, HTN +/-cardiomyopathy, +/-prediabetes).  Rate control is now very good and she has a lot of ventricular pacing.  We might be able to stop her digoxin.  However, clinically she is doing quite well and no changes were made today.  Renal function is normal PPM: Approximately 1 more year of battery longevity.  Discussed the generator change out in some detail today. Apical HCM: She has not had clinical evidence of heart failure or ventricular arrhythmia.  No evidence of LV outflow tract obstruction by echo.  The cardiomyopathy may be associated with diastolic dysfunction. Anticoagulation: She had one episode where she found her self on the floor, but this was not a true fall.  She has not had serious bleeding complications.  Continue anticoagulation. HTN: Usually well controlled.  No changes made to her medications. HLP: Lipid parameters are all in target range.  She has a variety of musculoskeletal complaints, but a previous "statin holiday" was not associated with any improvement in these complaints which appear to be due to degenerative arthritis.    Patient Instructions  Medication Instructions:  No changes *If you need a refill on your cardiac medications before your next appointment, please call your pharmacy*   Lab Work: None ordered If you have labs (blood work) drawn today and your tests are completely normal, you will receive your results only by: [place] (if you have MyChart) OR A paper copy in the mail If  you have any lab test that is abnormal or we need to change your treatment, we will call you to review the results.   Testing/Procedures: None ordered   Follow-Up: At [facility], you and your health needs are our priority.  As part of our continuing mission to provide you with exceptional heart care, we have created designated Provider Care Teams.  These Care Teams include your primary Cardiologist (physician) and Advanced Practice Providers (APPs -  Physician Assistants and Nurse Practitioners) who all work together to provide you with the care you need, when you need  it.  We recommend signing up for the patient portal called "MyChart".  Sign up information is provided on this After Visit Summary.  MyChart is used to connect with patients for Virtual Visits (Telemedicine).  Patients are able to view lab/test results, encounter notes, upcoming appointments, etc.  Non-urgent messages can be sent to your provider as well.   To learn more about what you can do with MyChart, go to [email or web address].    Your next appointment:   12 month(s)  The format for your next appointment:   In Person  Provider:   [name], MD   [name], MD  04/07/2021 2:32 PM    [facility]

## 2021-04-30 ENCOUNTER — Ambulatory Visit: Payer: Medicare HMO | Admitting: Podiatry

## 2021-05-01 ENCOUNTER — Ambulatory Visit (INDEPENDENT_AMBULATORY_CARE_PROVIDER_SITE_OTHER): Payer: Medicare HMO

## 2021-05-01 DIAGNOSIS — I495 Sick sinus syndrome: Secondary | ICD-10-CM | POA: Diagnosis not present

## 2021-05-02 DIAGNOSIS — M170 Bilateral primary osteoarthritis of knee: Secondary | ICD-10-CM | POA: Diagnosis not present

## 2021-05-02 LAB — CUP PACEART REMOTE DEVICE CHECK
Battery Remaining Longevity: 11 mo
Battery Voltage: 2.89 V
Brady Statistic RA Percent Paced: 0 %
Brady Statistic RV Percent Paced: 89.33 %
Date Time Interrogation Session: 20230209220630
Implantable Lead Implant Date: 20150430
Implantable Lead Implant Date: 20150430
Implantable Lead Location: 753859
Implantable Lead Location: 753860
Implantable Lead Model: 5076
Implantable Lead Model: 5076
Implantable Pulse Generator Implant Date: 20150430
Lead Channel Impedance Value: 361 Ohm
Lead Channel Impedance Value: 418 Ohm
Lead Channel Impedance Value: 475 Ohm
Lead Channel Impedance Value: 532 Ohm
Lead Channel Pacing Threshold Amplitude: 0.75 V
Lead Channel Pacing Threshold Amplitude: 1.125 V
Lead Channel Pacing Threshold Pulse Width: 0.4 ms
Lead Channel Pacing Threshold Pulse Width: 0.4 ms
Lead Channel Sensing Intrinsic Amplitude: 0.5 mV
Lead Channel Sensing Intrinsic Amplitude: 0.5 mV
Lead Channel Sensing Intrinsic Amplitude: 17.125 mV
Lead Channel Sensing Intrinsic Amplitude: 17.125 mV
Lead Channel Setting Pacing Amplitude: 2.5 V
Lead Channel Setting Pacing Pulse Width: 0.4 ms
Lead Channel Setting Sensing Sensitivity: 4 mV

## 2021-05-06 NOTE — Progress Notes (Signed)
Remote pacemaker transmission.   

## 2021-05-16 ENCOUNTER — Encounter: Payer: Self-pay | Admitting: Podiatry

## 2021-05-16 ENCOUNTER — Other Ambulatory Visit: Payer: Self-pay

## 2021-05-16 ENCOUNTER — Ambulatory Visit: Payer: Medicare HMO | Admitting: Podiatry

## 2021-05-16 DIAGNOSIS — M19072 Primary osteoarthritis, left ankle and foot: Secondary | ICD-10-CM | POA: Diagnosis not present

## 2021-05-16 DIAGNOSIS — B351 Tinea unguium: Secondary | ICD-10-CM | POA: Diagnosis not present

## 2021-05-16 DIAGNOSIS — M79674 Pain in right toe(s): Secondary | ICD-10-CM | POA: Diagnosis not present

## 2021-05-16 DIAGNOSIS — M2041 Other hammer toe(s) (acquired), right foot: Secondary | ICD-10-CM | POA: Diagnosis not present

## 2021-05-16 DIAGNOSIS — M2042 Other hammer toe(s) (acquired), left foot: Secondary | ICD-10-CM | POA: Diagnosis not present

## 2021-05-16 DIAGNOSIS — D689 Coagulation defect, unspecified: Secondary | ICD-10-CM

## 2021-05-16 DIAGNOSIS — M79675 Pain in left toe(s): Secondary | ICD-10-CM | POA: Diagnosis not present

## 2021-05-16 DIAGNOSIS — L84 Corns and callosities: Secondary | ICD-10-CM | POA: Diagnosis not present

## 2021-05-20 NOTE — Progress Notes (Signed)
°  Subjective:  Patient ID: Denise Pugh, female    DOB: 21-Mar-1929,  MRN: [id number]  88 y.o. female presents at risk foot care with h/o coagulation defect and callus(es) b/l great toes and painful thick toenails that are difficult to trim. Painful toenails interfere with ambulation. Aggravating factors include wearing enclosed shoe gear. Pain is relieved with periodic professional debridement. Painful calluses are aggravated when weightbearing with and without shoegear. Pain is relieved with periodic professional debridement.  Patient  relates new concern of painful left 2nd digit at the toe joint. She relates joint is tender. She denies any preceding traumatic event. Pain is aggravated when wearing enclosed shoe gear.  PCP is [name], MD , and last visit was 02/17/2021  Allergies  Allergen Reactions   Penicillins Other (See Comments)    Has patient had a PCN reaction causing immediate rash, facial/tongue/throat swelling, SOB or lightheadedness with hypotension: Unknown Has patient had a PCN reaction causing severe rash involving mucus membranes or skin necrosis: Unknown Has patient had a PCN reaction that required hospitalization: Unknown Has patient had a PCN reaction occurring within the last 10 years: Unknown If all of the above answers are "NO", then may proceed with Cephalosporin use.    Tetanus Toxoid Swelling    Review of Systems: Negative except as noted in the HPI.   Objective:  Vascular Examination: CFT <3 seconds b/l LE. Palpable pedal pulses b/l LE. Lower extremity skin temperature gradient within normal limits. No edema noted b/l LE.  Neurological Examination: Sensation grossly intact b/l with 10 gram monofilament. Vibratory sensation intact b/l.   Dermatological Examination: Pedal integument with normal turgor, texture and tone b/l LE. No open wounds b/l. No interdigital macerations b/l. Toenails 1-5 b/l elongated, thickened, discolored with subungual debris.  +Tenderness with dorsal palpation of nailplates. Hyperkeratotic lesion(s) noted L hallux and R hallux. Toenails 1-5 b/l elongated, discolored, dystrophic, thickened, crumbly with subungual debris and tenderness to dorsal palpation..   Musculoskeletal Examination: Normal muscle strength 5/5 to all lower extremity muscle groups bilaterally. No pain, crepitus or joint limitation noted with ROM b/l LE. No gross bony pedal deformities b/l. Patient ambulates independently without assistive aids. Hammertoe(s) noted to the 2-5 bilaterally.  Radiographs: None Assessment:   1. Pain due to onychomycosis of toenails of both feet   2. Callus of foot   3. Acquired hammertoes of both feet   4. Osteoarthritis of joint of toe of left foot   5. Pain in left toe(s)   6. Coagulation defect ([place])    Plan:  -Examined patient. -No new findings. No new orders. -Patient advised to apply Diclofenac Gel to left 2nd digit toe joint once daily. -Toenails 1-5 b/l were debrided in length and girth with sterile nail nippers and dremel without iatrogenic bleeding.  -Callus(es) bilateral great toes pared utilizing sterile scalpel blade without complication or incident. Total number debrided =2. -Patient/POA to call should there be question/concern in the interim.    Return in about 3 months (around 08/13/2021).  Marzetta Board, DPM

## 2021-06-16 DIAGNOSIS — R7301 Impaired fasting glucose: Secondary | ICD-10-CM | POA: Diagnosis not present

## 2021-06-16 DIAGNOSIS — E039 Hypothyroidism, unspecified: Secondary | ICD-10-CM | POA: Diagnosis not present

## 2021-06-16 DIAGNOSIS — E782 Mixed hyperlipidemia: Secondary | ICD-10-CM | POA: Diagnosis not present

## 2021-06-18 DIAGNOSIS — R32 Unspecified urinary incontinence: Secondary | ICD-10-CM | POA: Diagnosis not present

## 2021-06-18 DIAGNOSIS — I482 Chronic atrial fibrillation, unspecified: Secondary | ICD-10-CM | POA: Diagnosis not present

## 2021-06-18 DIAGNOSIS — E782 Mixed hyperlipidemia: Secondary | ICD-10-CM | POA: Diagnosis not present

## 2021-06-18 DIAGNOSIS — R7301 Impaired fasting glucose: Secondary | ICD-10-CM | POA: Diagnosis not present

## 2021-06-18 DIAGNOSIS — M1711 Unilateral primary osteoarthritis, right knee: Secondary | ICD-10-CM | POA: Diagnosis not present

## 2021-06-18 DIAGNOSIS — E559 Vitamin D deficiency, unspecified: Secondary | ICD-10-CM | POA: Diagnosis not present

## 2021-06-18 DIAGNOSIS — K219 Gastro-esophageal reflux disease without esophagitis: Secondary | ICD-10-CM | POA: Diagnosis not present

## 2021-06-18 DIAGNOSIS — F331 Major depressive disorder, recurrent, moderate: Secondary | ICD-10-CM | POA: Diagnosis not present

## 2021-06-18 DIAGNOSIS — E039 Hypothyroidism, unspecified: Secondary | ICD-10-CM | POA: Diagnosis not present

## 2021-07-30 ENCOUNTER — Other Ambulatory Visit: Payer: Self-pay | Admitting: Cardiovascular Disease

## 2021-07-31 ENCOUNTER — Ambulatory Visit (INDEPENDENT_AMBULATORY_CARE_PROVIDER_SITE_OTHER): Payer: Medicare PPO

## 2021-07-31 DIAGNOSIS — I495 Sick sinus syndrome: Secondary | ICD-10-CM

## 2021-07-31 NOTE — Telephone Encounter (Signed)
Prescription refill request for Eliquis received. ?Indication:Afib ?Last office visit:1/23 ?Scr:0.9 ?Age: 88 ?Weight:72.1 kg ? ?Prescription refilled ? ?

## 2021-08-01 LAB — CUP PACEART REMOTE DEVICE CHECK
Battery Remaining Longevity: 8 mo
Battery Voltage: 2.88 V
Brady Statistic RA Percent Paced: 0 %
Brady Statistic RV Percent Paced: 90.8 %
Date Time Interrogation Session: 20230512104349
Implantable Lead Implant Date: 20150430
Implantable Lead Implant Date: 20150430
Implantable Lead Location: 753859
Implantable Lead Location: 753860
Implantable Lead Model: 5076
Implantable Lead Model: 5076
Implantable Pulse Generator Implant Date: 20150430
Lead Channel Impedance Value: 361 Ohm
Lead Channel Impedance Value: 418 Ohm
Lead Channel Impedance Value: 456 Ohm
Lead Channel Impedance Value: 513 Ohm
Lead Channel Pacing Threshold Amplitude: 0.875 V
Lead Channel Pacing Threshold Amplitude: 1.125 V
Lead Channel Pacing Threshold Pulse Width: 0.4 ms
Lead Channel Pacing Threshold Pulse Width: 0.4 ms
Lead Channel Sensing Intrinsic Amplitude: 0.625 mV
Lead Channel Sensing Intrinsic Amplitude: 0.625 mV
Lead Channel Sensing Intrinsic Amplitude: 18.125 mV
Lead Channel Sensing Intrinsic Amplitude: 18.125 mV
Lead Channel Setting Pacing Amplitude: 2.5 V
Lead Channel Setting Pacing Pulse Width: 0.4 ms
Lead Channel Setting Sensing Sensitivity: 4 mV

## 2021-08-07 NOTE — Progress Notes (Signed)
Remote pacemaker transmission.   

## 2021-08-20 ENCOUNTER — Ambulatory Visit: Payer: Medicare PPO | Admitting: Podiatry

## 2021-08-20 ENCOUNTER — Encounter: Payer: Self-pay | Admitting: Podiatry

## 2021-08-20 DIAGNOSIS — D689 Coagulation defect, unspecified: Secondary | ICD-10-CM

## 2021-08-20 DIAGNOSIS — M79674 Pain in right toe(s): Secondary | ICD-10-CM

## 2021-08-20 DIAGNOSIS — M79675 Pain in left toe(s): Secondary | ICD-10-CM | POA: Diagnosis not present

## 2021-08-20 DIAGNOSIS — D688 Other specified coagulation defects: Secondary | ICD-10-CM

## 2021-08-20 DIAGNOSIS — B351 Tinea unguium: Secondary | ICD-10-CM | POA: Diagnosis not present

## 2021-08-20 DIAGNOSIS — L84 Corns and callosities: Secondary | ICD-10-CM | POA: Diagnosis not present

## 2021-08-24 NOTE — Progress Notes (Signed)
  Subjective:  Patient ID: Denise Pugh, female    DOB: 1928/08/02,  MRN: [id number]  ADYLIN HANKEY presents to clinic today for at risk foot care with h/o coagulation defect and callus(es) b/l lower extremities and painful thick toenails that are difficult to trim. Painful toenails interfere with ambulation. Aggravating factors include wearing enclosed shoe gear. Pain is relieved with periodic professional debridement. Painful calluses are aggravated when weightbearing with and without shoegear. Pain is relieved with periodic professional debridement.  New problem(s): None.   PCP is [name], MD , and last visit was February 17, 2021.  Allergies  Allergen Reactions   Penicillins Other (See Comments)    Has patient had a PCN reaction causing immediate rash, facial/tongue/throat swelling, SOB or lightheadedness with hypotension: Unknown Has patient had a PCN reaction causing severe rash involving mucus membranes or skin necrosis: Unknown Has patient had a PCN reaction that required hospitalization: Unknown Has patient had a PCN reaction occurring within the last 10 years: Unknown If all of the above answers are "NO", then may proceed with Cephalosporin use.    Tetanus Toxoid Swelling    Review of Systems: Negative except as noted in the HPI.  Objective: No changes noted in today's physical examination. Vascular Examination: CFT <3 seconds b/l LE. Palpable pedal pulses b/l LE. Lower extremity skin temperature gradient within normal limits. No edema noted b/l LE.  Neurological Examination: Sensation grossly intact b/l with 10 gram monofilament. Vibratory sensation intact b/l.   Dermatological Examination: Pedal integument with normal turgor, texture and tone b/l LE. No open wounds b/l. No interdigital macerations b/l. Toenails 1-5 b/l elongated, thickened, discolored with subungual debris. +Tenderness with dorsal palpation of nailplates. Hyperkeratotic lesion(s) noted distal aspect L  hallux. Toenails 1-5 b/l elongated, discolored, dystrophic, thickened, crumbly with subungual debris and tenderness to dorsal palpation..   Musculoskeletal Examination: Normal muscle strength 5/5 to all lower extremity muscle groups bilaterally. No pain, crepitus or joint limitation noted with ROM b/l LE. No gross bony pedal deformities b/l. Patient ambulates independently without assistive aids. Hammertoe(s) noted to the 2-5 bilaterally.  Assessment/Plan: 1. Pain due to onychomycosis of toenails of both feet   2. Callus   3. Coagulation defect [facility])     -Patient was evaluated and treated. All patient's and/or POA's questions/concerns answered on today's visit. -Examined patient. -Mycotic toenails 1-5 bilaterally were debrided in length and girth with sterile nail nippers and dremel without incident. -Callus(es) distal tip of left hallux pared utilizing sterile scalpel blade without complication or incident. Total number debrided =1. -Patient/POA to call should there be question/concern in the interim.   Return in about 3 months (around 11/20/2021).  Denise Pugh, DPM

## 2021-10-03 DIAGNOSIS — F332 Major depressive disorder, recurrent severe without psychotic features: Secondary | ICD-10-CM | POA: Diagnosis not present

## 2021-10-13 DIAGNOSIS — R7301 Impaired fasting glucose: Secondary | ICD-10-CM | POA: Diagnosis not present

## 2021-10-13 DIAGNOSIS — E782 Mixed hyperlipidemia: Secondary | ICD-10-CM | POA: Diagnosis not present

## 2021-10-13 DIAGNOSIS — E039 Hypothyroidism, unspecified: Secondary | ICD-10-CM | POA: Diagnosis not present

## 2021-10-15 DIAGNOSIS — E782 Mixed hyperlipidemia: Secondary | ICD-10-CM | POA: Diagnosis not present

## 2021-10-15 DIAGNOSIS — E039 Hypothyroidism, unspecified: Secondary | ICD-10-CM | POA: Diagnosis not present

## 2021-10-15 DIAGNOSIS — I10 Essential (primary) hypertension: Secondary | ICD-10-CM | POA: Diagnosis not present

## 2021-10-15 DIAGNOSIS — E559 Vitamin D deficiency, unspecified: Secondary | ICD-10-CM | POA: Diagnosis not present

## 2021-10-22 DIAGNOSIS — H353113 Nonexudative age-related macular degeneration, right eye, advanced atrophic without subfoveal involvement: Secondary | ICD-10-CM | POA: Diagnosis not present

## 2021-10-22 DIAGNOSIS — H353124 Nonexudative age-related macular degeneration, left eye, advanced atrophic with subfoveal involvement: Secondary | ICD-10-CM | POA: Diagnosis not present

## 2021-10-27 DIAGNOSIS — E782 Mixed hyperlipidemia: Secondary | ICD-10-CM | POA: Diagnosis not present

## 2021-10-27 DIAGNOSIS — E039 Hypothyroidism, unspecified: Secondary | ICD-10-CM | POA: Diagnosis not present

## 2021-10-27 DIAGNOSIS — M1711 Unilateral primary osteoarthritis, right knee: Secondary | ICD-10-CM | POA: Diagnosis not present

## 2021-10-27 DIAGNOSIS — L409 Psoriasis, unspecified: Secondary | ICD-10-CM | POA: Diagnosis not present

## 2021-10-27 DIAGNOSIS — K219 Gastro-esophageal reflux disease without esophagitis: Secondary | ICD-10-CM | POA: Diagnosis not present

## 2021-10-27 DIAGNOSIS — E559 Vitamin D deficiency, unspecified: Secondary | ICD-10-CM | POA: Diagnosis not present

## 2021-10-27 DIAGNOSIS — R7301 Impaired fasting glucose: Secondary | ICD-10-CM | POA: Diagnosis not present

## 2021-10-27 DIAGNOSIS — I482 Chronic atrial fibrillation, unspecified: Secondary | ICD-10-CM | POA: Diagnosis not present

## 2021-10-27 DIAGNOSIS — R32 Unspecified urinary incontinence: Secondary | ICD-10-CM | POA: Diagnosis not present

## 2021-10-30 ENCOUNTER — Ambulatory Visit (INDEPENDENT_AMBULATORY_CARE_PROVIDER_SITE_OTHER): Payer: Medicare PPO

## 2021-10-30 DIAGNOSIS — I495 Sick sinus syndrome: Secondary | ICD-10-CM | POA: Diagnosis not present

## 2021-10-31 ENCOUNTER — Telehealth: Payer: Self-pay

## 2021-10-31 LAB — CUP PACEART REMOTE DEVICE CHECK
Battery Remaining Longevity: 5 mo
Battery Voltage: 2.86 V
Brady Statistic RA Percent Paced: 0 %
Brady Statistic RV Percent Paced: 93.98 %
Date Time Interrogation Session: 20230811110526
Implantable Lead Implant Date: 20150430
Implantable Lead Implant Date: 20150430
Implantable Lead Location: 753859
Implantable Lead Location: 753860
Implantable Lead Model: 5076
Implantable Lead Model: 5076
Implantable Pulse Generator Implant Date: 20150430
Lead Channel Impedance Value: 361 Ohm
Lead Channel Impedance Value: 418 Ohm
Lead Channel Impedance Value: 456 Ohm
Lead Channel Impedance Value: 513 Ohm
Lead Channel Pacing Threshold Amplitude: 0.875 V
Lead Channel Pacing Threshold Amplitude: 1.125 V
Lead Channel Pacing Threshold Pulse Width: 0.4 ms
Lead Channel Pacing Threshold Pulse Width: 0.4 ms
Lead Channel Sensing Intrinsic Amplitude: 1.125 mV
Lead Channel Sensing Intrinsic Amplitude: 1.125 mV
Lead Channel Sensing Intrinsic Amplitude: 16.375 mV
Lead Channel Sensing Intrinsic Amplitude: 16.375 mV
Lead Channel Setting Pacing Amplitude: 2.5 V
Lead Channel Setting Pacing Pulse Width: 0.4 ms
Lead Channel Setting Sensing Sensitivity: 4 mV

## 2021-10-31 NOTE — Telephone Encounter (Signed)
Spoke with patient by phone today reviewing her PPM device check report in [place].   Device function is normal, no concerns with readings; however, did inform patient that her device is in RRT at the 5 month mark.   I have placed her on monthly remote checks to continue to monitor and explained that once patient reaches ERI (which will still give [place] a 3 month window) we will get her scheduled for her gen change.   Reviewed with patient her monthly schedule for remote check ins.  Patient given time to ask questions and she did verbalize understanding of process moving forward.    She is aware if any further questions or concerns to call our office.  Overall patient is doing well without any complaints.

## 2021-11-26 ENCOUNTER — Other Ambulatory Visit: Payer: Self-pay | Admitting: Cardiovascular Disease

## 2021-11-26 DIAGNOSIS — I480 Paroxysmal atrial fibrillation: Secondary | ICD-10-CM

## 2021-11-26 NOTE — Telephone Encounter (Signed)
*  STAT* If patient is at the pharmacy, call can be transferred to refill team.   1. Which medications need to be refilled? (please list name of each medication and dose if known)  metoprolol tartrate (LOPRESSOR) 100 MG tablet ELIQUIS 5 MG TABS tablet digoxin (LANOXIN) 0.125 MG tablet buPROPion (WELLBUTRIN) 150 MG tablet hydrochlorothiazide (MICROZIDE) 12.5 MG capsule atorvastatin (LIPITOR) 20 MG tablet   2. Which pharmacy/location (including street and city if local pharmacy) is medication to be sent to? [facility]   3. Do they need a 30 day or 90 day supply? 90 day

## 2021-11-27 NOTE — Progress Notes (Signed)
Remote pacemaker transmission.   

## 2021-11-28 MED ORDER — HYDROCHLOROTHIAZIDE 12.5 MG PO CAPS: 12.5000 mg | ORAL_CAPSULE | Freq: Every day | ORAL | 0 refills | Status: DC

## 2021-11-28 MED ORDER — ATORVASTATIN CALCIUM 20 MG PO TABS: 10.0000 mg | ORAL_TABLET | Freq: Every day | ORAL | 0 refills | Status: DC

## 2021-11-28 MED ORDER — DIGOXIN 125 MCG PO TABS: 125.0000 ug | ORAL_TABLET | Freq: Every day | ORAL | 0 refills | Status: DC

## 2021-11-28 MED ORDER — METOPROLOL TARTRATE 100 MG PO TABS: ORAL_TABLET | ORAL | 0 refills | Status: DC

## 2021-11-28 MED ORDER — APIXABAN 5 MG PO TABS: 5.0000 mg | ORAL_TABLET | Freq: Two times a day (BID) | ORAL | 1 refills | Status: DC

## 2021-11-28 NOTE — Telephone Encounter (Signed)
Prescription refill request for Eliquis received. Indication: Afib  Last office visit:04/07/21 ([place]) Scr: 0.89 (10/15/21) Age: 88 Weight: 72.1kg  Appropriate dose and refill sent to requested pharmacy.

## 2021-12-01 ENCOUNTER — Ambulatory Visit (INDEPENDENT_AMBULATORY_CARE_PROVIDER_SITE_OTHER): Payer: Medicare PPO

## 2021-12-01 DIAGNOSIS — I495 Sick sinus syndrome: Secondary | ICD-10-CM

## 2021-12-02 ENCOUNTER — Ambulatory Visit: Payer: Medicare PPO | Admitting: Podiatry

## 2021-12-02 ENCOUNTER — Encounter: Payer: Self-pay | Admitting: Podiatry

## 2021-12-02 DIAGNOSIS — M79609 Pain in unspecified limb: Secondary | ICD-10-CM

## 2021-12-02 DIAGNOSIS — L84 Corns and callosities: Secondary | ICD-10-CM | POA: Diagnosis not present

## 2021-12-02 DIAGNOSIS — D689 Coagulation defect, unspecified: Secondary | ICD-10-CM

## 2021-12-02 DIAGNOSIS — B351 Tinea unguium: Secondary | ICD-10-CM

## 2021-12-02 DIAGNOSIS — D688 Other specified coagulation defects: Secondary | ICD-10-CM

## 2021-12-03 LAB — CUP PACEART REMOTE DEVICE CHECK
Battery Remaining Longevity: 5 mo
Battery Voltage: 2.86 V
Brady Statistic RA Percent Paced: 0 %
Brady Statistic RV Percent Paced: 88.9 %
Date Time Interrogation Session: 20230911123757
Implantable Lead Implant Date: 20150430
Implantable Lead Implant Date: 20150430
Implantable Lead Location: 753859
Implantable Lead Location: 753860
Implantable Lead Model: 5076
Implantable Lead Model: 5076
Implantable Pulse Generator Implant Date: 20150430
Lead Channel Impedance Value: 361 Ohm
Lead Channel Impedance Value: 437 Ohm
Lead Channel Impedance Value: 456 Ohm
Lead Channel Impedance Value: 513 Ohm
Lead Channel Pacing Threshold Amplitude: 0.75 V
Lead Channel Pacing Threshold Amplitude: 1.125 V
Lead Channel Pacing Threshold Pulse Width: 0.4 ms
Lead Channel Pacing Threshold Pulse Width: 0.4 ms
Lead Channel Sensing Intrinsic Amplitude: 0.5 mV
Lead Channel Sensing Intrinsic Amplitude: 0.5 mV
Lead Channel Sensing Intrinsic Amplitude: 15.875 mV
Lead Channel Sensing Intrinsic Amplitude: 15.875 mV
Lead Channel Setting Pacing Amplitude: 2.5 V
Lead Channel Setting Pacing Pulse Width: 0.4 ms
Lead Channel Setting Sensing Sensitivity: 4 mV

## 2021-12-06 NOTE — Progress Notes (Signed)
  Subjective:  Patient ID: Denise Pugh, female    DOB: 07-04-1928,  MRN: [id number]  Denise Pugh presents to clinic today for at risk foot care with h/o coagulation defect and callus(es) left lower extremity and painful thick toenails that are difficult to trim. Painful toenails interfere with ambulation. Aggravating factors include wearing enclosed shoe gear. Pain is relieved with periodic professional debridement. Painful calluses are aggravated when weightbearing with and without shoegear. Pain is relieved with periodic professional debridement.  New problem(s): None.   PCP is [name], MD , and last visit was  February 17, 2021.  Allergies  Allergen Reactions   Penicillins Other (See Comments)    Has patient had a PCN reaction causing immediate rash, facial/tongue/throat swelling, SOB or lightheadedness with hypotension: Unknown Has patient had a PCN reaction causing severe rash involving mucus membranes or skin necrosis: Unknown Has patient had a PCN reaction that required hospitalization: Unknown Has patient had a PCN reaction occurring within the last 10 years: Unknown If all of the above answers are "NO", then may proceed with Cephalosporin use.    Tetanus Toxoid Swelling    Review of Systems: Negative except as noted in the HPI.  Objective: No changes noted in today's physical examination. Denise Pugh is a pleasant 88 y.o. female in NAD. AAO x 3.  Vascular Examination: CFT <3 seconds b/l LE. Palpable pedal pulses b/l LE. Lower extremity skin temperature gradient within normal limits. No edema noted b/l LE.  Neurological Examination: Sensation grossly intact b/l with 10 gram monofilament. Vibratory sensation intact b/l.   Dermatological Examination: Pedal integument with normal turgor, texture and tone b/l LE. No open wounds b/l. No interdigital macerations b/l. Toenails right great toe and 2-5 b/l elongated, thickened, discolored with subungual debris. +Tenderness  with dorsal palpation of nailplates.  Anonychia noted L hallux. Nailbed(s) epithelialized.    Hyperkeratotic lesion(s) noted distal aspect L hallux.   Musculoskeletal Examination: Normal muscle strength 5/5 to all lower extremity muscle groups bilaterally. No pain, crepitus or joint limitation noted with ROM b/l LE. No gross bony pedal deformities b/l. Patient ambulates independently without assistive aids. Hammertoe(s) noted to the 2-5 bilaterally.  Assessment/Plan: 1. Pain due to onychomycosis of nail   2. Callus   3. Coagulation defect ([place])     -Consent given for treatment as described below: -Toenails 2-5 bilaterally and R hallux debrided in length and girth without iatrogenic bleeding with sterile nail nipper and dremel.  -Callus(es) distal tip of left great toe pared utilizing sterile scalpel blade without complication or incident. Total number debrided =1. -Patient/POA to call should there be question/concern in the interim.   Return in about 3 months (around 03/03/2022).  Marzetta Board, DPM

## 2021-12-18 NOTE — Progress Notes (Signed)
Remote pacemaker transmission.   

## 2021-12-29 ENCOUNTER — Ambulatory Visit (INDEPENDENT_AMBULATORY_CARE_PROVIDER_SITE_OTHER): Payer: Medicare PPO

## 2021-12-29 DIAGNOSIS — I495 Sick sinus syndrome: Secondary | ICD-10-CM

## 2021-12-30 LAB — CUP PACEART REMOTE DEVICE CHECK
Battery Remaining Longevity: 4 mo
Battery Voltage: 2.85 V
Brady Statistic RA Percent Paced: 0 %
Brady Statistic RV Percent Paced: 91.56 %
Date Time Interrogation Session: 20231009105156
Implantable Lead Implant Date: 20150430
Implantable Lead Implant Date: 20150430
Implantable Lead Location: 753859
Implantable Lead Location: 753860
Implantable Lead Model: 5076
Implantable Lead Model: 5076
Implantable Pulse Generator Implant Date: 20150430
Lead Channel Impedance Value: 342 Ohm
Lead Channel Impedance Value: 418 Ohm
Lead Channel Impedance Value: 456 Ohm
Lead Channel Impedance Value: 513 Ohm
Lead Channel Pacing Threshold Amplitude: 0.875 V
Lead Channel Pacing Threshold Amplitude: 1.125 V
Lead Channel Pacing Threshold Pulse Width: 0.4 ms
Lead Channel Pacing Threshold Pulse Width: 0.4 ms
Lead Channel Sensing Intrinsic Amplitude: 0.5 mV
Lead Channel Sensing Intrinsic Amplitude: 0.5 mV
Lead Channel Sensing Intrinsic Amplitude: 17.125 mV
Lead Channel Sensing Intrinsic Amplitude: 17.125 mV
Lead Channel Setting Pacing Amplitude: 2.5 V
Lead Channel Setting Pacing Pulse Width: 0.4 ms
Lead Channel Setting Sensing Sensitivity: 4 mV

## 2022-01-20 ENCOUNTER — Telehealth: Payer: Self-pay | Admitting: Cardiovascular Disease

## 2022-01-20 NOTE — Telephone Encounter (Signed)
Attempted to reach the patient. The line kept ringing and was unable to leave a message.   Last download on 10/9 showed approximately 4 months left with her battery. Will call the patient back to schedule her with Dr. Sallyanne Kuster in the next few months.

## 2022-01-20 NOTE — Telephone Encounter (Signed)
Patient is calling because she is going to have teeth extracted by a Dr. Mancel Parsons, and she has a Psychologist, forensic.  She said Dr. Mancel Parsons number is [phone]. She said she spoke to one here in the office and they told her to get Dr. Sander Radon number and that we would call him.

## 2022-01-20 NOTE — Telephone Encounter (Signed)
Spoke to patient she stated she needs 3 teeth pulled.Advised dentist will need to fax a clearance to office.She is concerned her pacemaker battery is getting low.She has a remote device check this Monday 11/6 at 9:05 am.Advised Dr.Croitoru is out of office this week.She wanted to know if she needs appointment with Dr.Croitoru.I will send message to him for advice.

## 2022-01-23 NOTE — Telephone Encounter (Signed)
The patient has been made aware that we will call her to set up the pre procedure appointment once the battery gets closer to ERI. She is due for a download on 11/6. She has verbalized her understanding.

## 2022-01-26 ENCOUNTER — Ambulatory Visit (INDEPENDENT_AMBULATORY_CARE_PROVIDER_SITE_OTHER): Payer: Medicare PPO

## 2022-01-26 DIAGNOSIS — I495 Sick sinus syndrome: Secondary | ICD-10-CM

## 2022-01-28 LAB — CUP PACEART REMOTE DEVICE CHECK
Battery Remaining Longevity: 3 mo
Battery Voltage: 2.85 V
Brady Statistic RA Percent Paced: 0 %
Brady Statistic RV Percent Paced: 93.95 %
Date Time Interrogation Session: 20231106223217
Implantable Lead Connection Status: 753985
Implantable Lead Connection Status: 753985
Implantable Lead Implant Date: 20150430
Implantable Lead Implant Date: 20150430
Implantable Lead Location: 753859
Implantable Lead Location: 753860
Implantable Lead Model: 5076
Implantable Lead Model: 5076
Implantable Pulse Generator Implant Date: 20150430
Lead Channel Impedance Value: 361 Ohm
Lead Channel Impedance Value: 418 Ohm
Lead Channel Impedance Value: 456 Ohm
Lead Channel Impedance Value: 494 Ohm
Lead Channel Pacing Threshold Amplitude: 0.875 V
Lead Channel Pacing Threshold Amplitude: 1.125 V
Lead Channel Pacing Threshold Pulse Width: 0.4 ms
Lead Channel Pacing Threshold Pulse Width: 0.4 ms
Lead Channel Sensing Intrinsic Amplitude: 0.5 mV
Lead Channel Sensing Intrinsic Amplitude: 0.5 mV
Lead Channel Sensing Intrinsic Amplitude: 15.75 mV
Lead Channel Sensing Intrinsic Amplitude: 15.75 mV
Lead Channel Setting Pacing Amplitude: 2.5 V
Lead Channel Setting Pacing Pulse Width: 0.4 ms
Lead Channel Setting Sensing Sensitivity: 4 mV
Zone Setting Status: 755011
Zone Setting Status: 755011

## 2022-01-30 NOTE — Addendum Note (Signed)
Addended by: Douglass Rivers D on: 01/30/2022 03:54 PM   Modules accepted: Level of Service

## 2022-01-30 NOTE — Progress Notes (Signed)
Remote pacemaker transmission.   

## 2022-02-03 ENCOUNTER — Ambulatory Visit: Payer: Medicare PPO | Admitting: Cardiovascular Disease

## 2022-02-04 ENCOUNTER — Other Ambulatory Visit: Payer: Self-pay | Admitting: Cardiovascular Disease

## 2022-02-23 ENCOUNTER — Ambulatory Visit (INDEPENDENT_AMBULATORY_CARE_PROVIDER_SITE_OTHER): Payer: Medicare PPO

## 2022-02-23 DIAGNOSIS — I495 Sick sinus syndrome: Secondary | ICD-10-CM

## 2022-02-23 LAB — CUP PACEART REMOTE DEVICE CHECK
Battery Remaining Longevity: 2 mo
Battery Voltage: 2.84 V
Brady Statistic RA Percent Paced: 0 %
Brady Statistic RV Percent Paced: 91.56 %
Date Time Interrogation Session: 20231204085510
Implantable Lead Connection Status: 753985
Implantable Lead Connection Status: 753985
Implantable Lead Implant Date: 20150430
Implantable Lead Implant Date: 20150430
Implantable Lead Location: 753859
Implantable Lead Location: 753860
Implantable Lead Model: 5076
Implantable Lead Model: 5076
Implantable Pulse Generator Implant Date: 20150430
Lead Channel Impedance Value: 361 Ohm
Lead Channel Impedance Value: 437 Ohm
Lead Channel Impedance Value: 475 Ohm
Lead Channel Impedance Value: 513 Ohm
Lead Channel Pacing Threshold Amplitude: 0.875 V
Lead Channel Pacing Threshold Amplitude: 1.125 V
Lead Channel Pacing Threshold Pulse Width: 0.4 ms
Lead Channel Pacing Threshold Pulse Width: 0.4 ms
Lead Channel Sensing Intrinsic Amplitude: 0.375 mV
Lead Channel Sensing Intrinsic Amplitude: 0.375 mV
Lead Channel Sensing Intrinsic Amplitude: 16.75 mV
Lead Channel Sensing Intrinsic Amplitude: 16.75 mV
Lead Channel Setting Pacing Amplitude: 2.5 V
Lead Channel Setting Pacing Pulse Width: 0.4 ms
Lead Channel Setting Sensing Sensitivity: 4 mV
Zone Setting Status: 755011
Zone Setting Status: 755011

## 2022-02-28 ENCOUNTER — Other Ambulatory Visit: Payer: Self-pay | Admitting: Cardiovascular Disease

## 2022-03-04 ENCOUNTER — Ambulatory Visit: Payer: Medicare PPO | Admitting: Podiatry

## 2022-03-04 DIAGNOSIS — Z91199 Patient's noncompliance with other medical treatment and regimen due to unspecified reason: Secondary | ICD-10-CM

## 2022-03-04 NOTE — Progress Notes (Signed)
Remote pacemaker transmission.   

## 2022-03-08 NOTE — Progress Notes (Signed)
1. No-show for appointment     

## 2022-03-21 ENCOUNTER — Other Ambulatory Visit: Payer: Self-pay | Admitting: Cardiovascular Disease

## 2022-03-24 ENCOUNTER — Ambulatory Visit (INDEPENDENT_AMBULATORY_CARE_PROVIDER_SITE_OTHER): Payer: Medicare PPO

## 2022-03-24 DIAGNOSIS — I495 Sick sinus syndrome: Secondary | ICD-10-CM

## 2022-03-24 LAB — CUP PACEART REMOTE DEVICE CHECK
Battery Remaining Longevity: 2 mo
Battery Voltage: 2.83 V
Brady Statistic RA Percent Paced: 0 %
Brady Statistic RV Percent Paced: 95.48 %
Date Time Interrogation Session: 20240102131330
Implantable Lead Connection Status: 753985
Implantable Lead Connection Status: 753985
Implantable Lead Implant Date: 20150430
Implantable Lead Implant Date: 20150430
Implantable Lead Location: 753859
Implantable Lead Location: 753860
Implantable Lead Model: 5076
Implantable Lead Model: 5076
Implantable Pulse Generator Implant Date: 20150430
Lead Channel Impedance Value: 361 Ohm
Lead Channel Impedance Value: 437 Ohm
Lead Channel Impedance Value: 456 Ohm
Lead Channel Impedance Value: 494 Ohm
Lead Channel Pacing Threshold Amplitude: 0.75 V
Lead Channel Pacing Threshold Amplitude: 1.125 V
Lead Channel Pacing Threshold Pulse Width: 0.4 ms
Lead Channel Pacing Threshold Pulse Width: 0.4 ms
Lead Channel Sensing Intrinsic Amplitude: 0.5 mV
Lead Channel Sensing Intrinsic Amplitude: 0.5 mV
Lead Channel Sensing Intrinsic Amplitude: 16.25 mV
Lead Channel Sensing Intrinsic Amplitude: 16.25 mV
Lead Channel Setting Pacing Amplitude: 2.5 V
Lead Channel Setting Pacing Pulse Width: 0.4 ms
Lead Channel Setting Sensing Sensitivity: 4 mV
Zone Setting Status: 755011
Zone Setting Status: 755011

## 2022-04-03 NOTE — Progress Notes (Signed)
Remote pacemaker transmission.   

## 2022-04-06 ENCOUNTER — Telehealth: Payer: Self-pay | Admitting: Cardiovascular Disease

## 2022-04-06 NOTE — Telephone Encounter (Signed)
Spoke to patient-patient thought she had appointment scheduled this week but is unable to locate the details (no appointment scheduled).      Per chart, patient is reaching ERI.  No appointment scheduled currently.    Advised would follow up with Dr. Sallyanne Kuster and return call.

## 2022-04-06 NOTE — Telephone Encounter (Signed)
Patient called to talk with Dr. Sallyanne Kuster or nurse in regards to procedure. Please call to discuss

## 2022-04-08 NOTE — Telephone Encounter (Signed)
Called patient left message on personal voice mail to call back to schedule follow up appointment with Dr.Croitoru.

## 2022-04-08 NOTE — Telephone Encounter (Signed)
Pt is returning call. Transferred to [place], Daniel.

## 2022-04-08 NOTE — Telephone Encounter (Addendum)
Spoke to patient advised Iam going to send message to Dr.Croitoru to ask when I need to schedule your appointment.Advised he does not have any openings until 3/4.Message sent to Dr.Croitoru for advice.

## 2022-04-09 NOTE — Telephone Encounter (Signed)
Spoke to patient appointment scheduled with Dr.Croitoru 05/25/22 at 9:40 am.

## 2022-04-23 ENCOUNTER — Telehealth: Payer: Self-pay | Admitting: Cardiovascular Disease

## 2022-04-23 DIAGNOSIS — E039 Hypothyroidism, unspecified: Secondary | ICD-10-CM | POA: Diagnosis not present

## 2022-04-23 DIAGNOSIS — R7301 Impaired fasting glucose: Secondary | ICD-10-CM | POA: Diagnosis not present

## 2022-04-23 DIAGNOSIS — E782 Mixed hyperlipidemia: Secondary | ICD-10-CM | POA: Diagnosis not present

## 2022-04-23 NOTE — Telephone Encounter (Signed)
Patient stated that [employer] wanted the list of her medications faxed to them. Verified fax number with pateint as [phone] (done).

## 2022-04-23 NOTE — Telephone Encounter (Signed)
Pt states her insurance is requiring a list of her medication be faxed over to them     The [employer] number: [phone]  Phone number: [phone]

## 2022-04-28 NOTE — Progress Notes (Signed)
Remote pacemaker transmission.   

## 2022-05-01 DIAGNOSIS — H04123 Dry eye syndrome of bilateral lacrimal glands: Secondary | ICD-10-CM | POA: Diagnosis not present

## 2022-05-01 DIAGNOSIS — H353124 Nonexudative age-related macular degeneration, left eye, advanced atrophic with subfoveal involvement: Secondary | ICD-10-CM | POA: Diagnosis not present

## 2022-05-19 ENCOUNTER — Ambulatory Visit
Admission: EM | Admit: 2022-05-19 | Discharge: 2022-05-19 | Disposition: A | Discharge status: Home / Self Care | Payer: Medicare HMO | Attending: Nurse Practitioner | Admitting: Nurse Practitioner

## 2022-05-19 DIAGNOSIS — S61411A Laceration without foreign body of right hand, initial encounter: Secondary | ICD-10-CM

## 2022-05-19 MED ORDER — DOXYCYCLINE HYCLATE 100 MG PO CAPS: 100.0000 mg | ORAL_CAPSULE | Freq: Two times a day (BID) | ORAL | 0 refills | Status: AC

## 2022-05-19 NOTE — Discharge Instructions (Addendum)
As we discussed, because of the type of wound that is on your hand, I was unable to perform any type of wound closure today.  We have cleaned the wound, applied a Vaseline gauze and nonadherent gauze dressing to the wound.  Please start on the doxycycline to prevent any infection from settling down on the wound.  Continue dressing changes and cleaning the wound twice daily.  Please follow-up here or with your primary care provider within the next 48 hours for wound recheck and to make sure it is healing appropriately.  If signs or symptoms of infection develop in the meantime such as pain, redness, or hand swelling, please follow-up sooner.

## 2022-05-19 NOTE — ED Triage Notes (Signed)
Pt reports her daughters dog scratched her in the right hand around 14:30 today.    Dog immunizations are up to date.

## 2022-05-19 NOTE — ED Provider Notes (Signed)
[facility]    CSN: [id number] Arrival date & time: 05/19/22  1655      History   Chief Complaint Chief Complaint  Patient presents with   Laceration    HPI Denise Pugh is a 89 y.o. female.   Patient presents today with daughter for laceration to right hand that she sustained a few hours ago.  Reports she was putting her grand dog when she jumped on her and scratched her hand.  No decreased range of motion or numbness/tingling in the hand/fingertips.  No swelling or obvious deformity.  The dog is up-to-date on vaccines.  Patient reports that her hand bled a little bit, however has now stopped.  She is on blood thinners for history of A-fib.  Reports last tetanus shot made her have an allergic reaction.  Denies antibiotic use in the past 90 days.    Past Medical History:  Diagnosis Date   Arrhythmia    HX of SVT. CARDIONET MONITOR 07/15/12 TO 08/13/12   Arthritis    Family history of adverse reaction to anesthesia 1970s   mother   GERD (gastroesophageal reflux disease)    Hyperlipidemia 08/06/11   lexiscan myoview-normal. Due to severe attenuation the study specificity and sensitivty are deminished.   Hypertension 08/25/10   ECHO-EF 60-65%   Hypothyroidism    Irregular heart beat    Shortness of breath    Sleep apnea    SLEEP STUDY-[facility]   Thyroid disease     Patient Active Problem List   Diagnosis Date Noted   Osteoarthritis of left knee 03/05/2021   Osteoarthritis of right knee 03/05/2021   Apical variant hypertrophic cardiomyopathy ([place]) 04/26/2018   Pacemaker 04/26/2018   Other persistent atrial fibrillation (HCC)    Persistent atrial fibrillation (HCC)    Neck pain 05/15/2017   First degree AV block 01/13/2016   Long term current use of anticoagulant 01/13/2016   Anticoagulation adequate 02/12/2015   Hyperlipidemia LDL goal <70 02/28/2014   Tachycardia-bradycardia syndrome ([place]) 07/21/2013   Bradycardia, drug induced  07/20/2013   Symptomatic sinus bradycardia 07/20/2013   S/P cardiac pacemaker procedure: Dr. Sallyanne Kuster: Generator [employer] Advisa MRI  07/20/2013    Class: Hospitalized for   Atrial fibrillation with RVR ([place]) 07/18/2013   Bradycardia, also idioventricular rhythm  07/18/2013   PAF (paroxysmal atrial fibrillation) ([place]) 06/26/2013   Grieving 05/29/2013   CAP (community acquired pneumonia) 04/08/2011   Hypothyroidism (acquired) 04/08/2011   HAMSTRING TENDINITIS 06/15/2008   RHINITIS, CHRONIC 04/30/2008   HIP PAIN, RIGHT 04/30/2008   KNEE PAIN, BILATERAL 04/30/2008   INTERTRIGO, CANDIDAL 01/05/2008   DIARRHEA 01/05/2008   COSTOCHONDRITIS 06/29/2007   HYPERLIPIDEMIA 09/30/2006   FATIGUE 09/30/2006   PARESTHESIA 09/30/2006   GASTRIC POLYP 03/06/2006   HYPOTHYROIDISM 03-06-2006   DEPRESSION, recent death of her husband 06-Mar-2006   Essential hypertension Mar 06, 2006   SUPRAVENTRICULAR TACHYCARDIA 06-Mar-2006   GERD 03/06/2006   MELANOSIS COLI March 06, 2006   OSTEOARTHRITIS Mar 06, 2006   LOW BACK PAIN March 06, 2006   DIVERTICULITIS, HX OF 03/06/06    Past Surgical History:  Procedure Laterality Date   CARDIAC CATHETERIZATION  07/06/01   spade-like configuration   CARDIOVERSION N/A 06/07/2017   Procedure: CARDIOVERSION;  Surgeon: [name], MD;  Location: [facility];  Service: Cardiovascular;  Laterality: N/A;   CARDIOVERSION N/A 04/14/2018   Procedure: CARDIOVERSION;  Surgeon: [name], MD;  Location: [facility];  Service: Cardiovascular;  Laterality: N/A;   DILATION AND CURETTAGE OF UTERUS  EYE SURGERY     KNEE ARTHROSCOPY     PERMANENT PACEMAKER INSERTION N/A 07/20/2013   Procedure: PERMANENT PACEMAKER INSERTION;  Surgeon: [name], MD;  Location: [facility];  Service: Cardiovascular;  Laterality: N/A;    OB History     Gravida      Para      Term      Preterm      AB      Living  1      SAB      IAB      Ectopic      Multiple       Live Births               Home Medications    Prior to Admission medications   Medication Sig Start Date End Date Taking? Authorizing Provider  doxycycline (VIBRAMYCIN) 100 MG capsule Take 1 capsule (100 mg total) by mouth 2 (two) times daily for 7 days. 05/19/22 05/26/22 Yes Eulogio Bear, NP  acetaminophen (TYLENOL) 500 MG tablet Take 500 mg by mouth every 6 (six) hours as needed for moderate pain, fever or headache.     [provider]  amiodarone (PACERONE) 200 MG tablet     [provider]  apixaban (ELIQUIS) 5 MG TABS tablet Take 1 tablet (5 mg total) by mouth 2 (two) times daily. 11/28/21   Croitoru, Mihai, MD  atorvastatin (LIPITOR) 20 MG tablet TAKE 1/2 TABLET EVERY DAY 03/24/22   Troy Sine, MD  buPROPion (WELLBUTRIN XL) 150 MG 24 hr tablet     [provider]  buPROPion (WELLBUTRIN) 75 MG tablet Take 75 mg by mouth 2 (two) times daily. 10/07/20   [provider]  Cholecalciferol (VITAMIN D) 2000 UNITS tablet Take 2,000 Units by mouth daily.    [provider]  diclofenac Sodium (VOLTAREN) 1 % GEL Apply topically as needed. 1% gel as needed for pain 03/15/19   [provider]  digoxin (LANOXIN) 0.125 MG tablet TAKE 1 TABLET EVERY DAY 03/03/22   Troy Sine, MD  DULoxetine (CYMBALTA) 30 MG capsule Take 30 mg by mouth daily. 10/27/21   [provider]  escitalopram (LEXAPRO) 20 MG tablet TAKE 1 TABLET BY MOUTH  DAILY 08/06/20   Croitoru, Mihai, MD  esomeprazole (NEXIUM) 40 MG capsule Take 40 mg by mouth daily.    [provider]  hydrochlorothiazide (MICROZIDE) 12.5 MG capsule TAKE 1 CAPSULE EVERY DAY 03/03/22   Troy Sine, MD  levothyroxine (SYNTHROID) 88 MCG tablet Take 88 mcg by mouth daily. 11/28/21   [provider]  Liniments (DEEP BLUE RELIEF EX) Apply 1 application topically daily as needed (for back, shoulder and knee pain). Patient not taking: Reported on 04/07/2021    [provider]  metoprolol tartrate (LOPRESSOR) 100 MG tablet TAKE 1 TABLET EVERY MORNING AND TAKE 1 TABLET EVERY EVENING 02/04/22   Troy Sine, MD  Multiple Vitamins-Minerals (PRESERVISION AREDS 2 PO) Take 2 tablets by mouth daily.    [provider]  mupirocin ointment (BACTROBAN) 2 % Place 1 application into the nose daily as needed (for dry nose). Patient not taking: Reported on 04/07/2021    [provider]  nystatin (MYCOSTATIN/NYSTOP) powder Apply topically 2 (two) times daily. Pt uses as needed. Patient not taking: Reported on 04/07/2021 09/21/19   [provider]  OVER THE COUNTER MEDICATION Place 1 drop into both ears daily. Organic Ear Oil Pt uses as needed  Patient not taking: Reported on 04/07/2021    [provider]  oxybutynin (DITROPAN) 5 MG tablet Take 5 mg by mouth 2 (two) times daily.  01/21/15   [provider]  sodium chloride (OCEAN) 0.65 % SOLN nasal spray Place 1 spray into both nostrils as needed for congestion. Patient not taking: Reported on 04/07/2021    [provider]  traMADol (ULTRAM) 50 MG tablet Take 100 mg by mouth 2 (two) times daily.  08/09/12   [provider]    Family History Family History  Problem Relation Age of Onset   Diabetes Mother    Hypertension Father    Diabetes Father    Heart attack Maternal Grandmother    Heart attack Maternal Grandfather     Social History Social History   Tobacco Use   Smoking status: Never   Smokeless tobacco: Never  Vaping Use   Vaping Use: Never used  Substance Use Topics   Alcohol use: No    Alcohol/week: 0.0 standard drinks of alcohol   Drug use: No     Allergies   Penicillins and Tetanus toxoid   Review of Systems Review of Systems Per HPI  Physical Exam Triage Vital Signs ED Triage Vitals  Enc Vitals Group     BP 05/19/22 1701 (!) 170/58     Pulse Rate 05/19/22 1701 73     Resp 05/19/22 1701 18     Temp 05/19/22 1707 98.1  F (36.7 C)     Temp Source 05/19/22 1701 Oral     SpO2 05/19/22 1701 93 %     Weight --      Height --      Head Circumference --      Peak Flow --      Pain Score 05/19/22 1705 1     Pain Loc --      Pain Edu? --      Excl. in [place]? --    No data found.  Updated Vital Signs BP (!) 170/58 (BP Location: Left Arm)   Pulse 73   Temp 98.1 F (36.7 C) (Oral)   Resp 18   SpO2 93%   Visual Acuity Right Eye Distance:   Left Eye Distance:   Bilateral Distance:    Right Eye Near:   Left Eye Near:    Bilateral Near:     Physical Exam Vitals and nursing note reviewed.  Constitutional:      General: She is not in acute distress.    Appearance: Normal appearance. She is not toxic-appearing.  HENT:     Head: Normocephalic and atraumatic.  Pulmonary:     Effort: Pulmonary effort is normal. No respiratory distress.  Musculoskeletal:     Right wrist: Normal pulse.     Left wrist: Normal pulse.     Right hand: No tenderness or bony tenderness. Normal range of motion. Normal strength. Normal sensation. Normal capillary refill. Normal pulse.       Hands:     Comments: Skin tear to approximately area marked; area is approximately 4 cm x 3 cm.  Bleeding is controlled.  Area is gaping and wound edges are unable to be well-approximated.   Skin:    General: Skin is warm and dry.     Capillary Refill: Capillary refill takes less than 2 seconds.     Coloration: Skin is not jaundiced or pale.     Findings: Laceration present. No erythema.     Comments: Large skin tear  to posterior right hand and approximately area marked; bleeding is controlled.  Neurological:     Mental Status: She is alert and oriented to person, place, and time.  Psychiatric:        Behavior: Behavior is cooperative.      UC Treatments / Results  Labs (all labs ordered are listed, but only abnormal results are displayed) Labs Reviewed - No data to display  EKG   Radiology No results  found.  Procedures Procedures (including critical care time)  Medications Ordered in UC Medications - No data to display  Initial Impression / Assessment and Plan / UC Course  I have reviewed the triage vital signs and the nursing notes.  Pertinent labs & imaging results that were available during my care of the patient were reviewed by me and considered in my medical decision making (see chart for details).   Patient is well-appearing, afebrile, not tachycardic, not tachypneic, oxygenating well on room air.  Patient is mildly hypertensive today, likely secondary to reason for visit.  1. Laceration of right hand without foreign body, initial encounter Given type of wound, unable to approximate well and therefore not a candidate for wound closure Wound was flushed and cleansed, Vaseline gauze was applied and wound was covered with nonadherent dressing and secured with tape Recommended wound care twice daily at home with dressing changes Start oral doxycycline for infection prophylaxis Signs and symptoms of infection discussed and advised patient to return if symptoms develop Recommended follow-up within 48 hours for wound recheck Tdap deferred given allergy  The patient was given the opportunity to ask questions.  All questions answered to their satisfaction.  The patient is in agreement to this plan.    Final Clinical Impressions(s) / UC Diagnoses   Final diagnoses:  Laceration of right hand without foreign body, initial encounter     Discharge Instructions      As we discussed, because of the type of wound that is on your hand, I was unable to perform any type of wound closure today.  We have cleaned the wound, applied a Vaseline gauze and nonadherent gauze dressing to the wound.  Please start on the doxycycline to prevent any infection from settling down on the wound.  Continue dressing changes and cleaning the wound twice daily.  Please follow-up here or with your primary care  provider within the next 48 hours for wound recheck and to make sure it is healing appropriately.  If signs or symptoms of infection develop in the meantime such as pain, redness, or hand swelling, please follow-up sooner.    ED Prescriptions     Medication Sig Dispense Auth. Provider   doxycycline (VIBRAMYCIN) 100 MG capsule Take 1 capsule (100 mg total) by mouth 2 (two) times daily for 7 days. 14 capsule Eulogio Bear, NP      PDMP not reviewed this encounter.   Eulogio Bear, NP 05/19/22 [phone]

## 2022-05-22 DIAGNOSIS — K219 Gastro-esophageal reflux disease without esophagitis: Secondary | ICD-10-CM | POA: Diagnosis not present

## 2022-05-22 DIAGNOSIS — L409 Psoriasis, unspecified: Secondary | ICD-10-CM | POA: Diagnosis not present

## 2022-05-22 DIAGNOSIS — I482 Chronic atrial fibrillation, unspecified: Secondary | ICD-10-CM | POA: Diagnosis not present

## 2022-05-22 DIAGNOSIS — G4733 Obstructive sleep apnea (adult) (pediatric): Secondary | ICD-10-CM | POA: Diagnosis not present

## 2022-05-22 DIAGNOSIS — M1711 Unilateral primary osteoarthritis, right knee: Secondary | ICD-10-CM | POA: Diagnosis not present

## 2022-05-22 DIAGNOSIS — E559 Vitamin D deficiency, unspecified: Secondary | ICD-10-CM | POA: Diagnosis not present

## 2022-05-22 DIAGNOSIS — E039 Hypothyroidism, unspecified: Secondary | ICD-10-CM | POA: Diagnosis not present

## 2022-05-22 DIAGNOSIS — R32 Unspecified urinary incontinence: Secondary | ICD-10-CM | POA: Diagnosis not present

## 2022-05-22 DIAGNOSIS — H3530 Unspecified macular degeneration: Secondary | ICD-10-CM | POA: Diagnosis not present

## 2022-05-22 DIAGNOSIS — I10 Essential (primary) hypertension: Secondary | ICD-10-CM | POA: Diagnosis not present

## 2022-05-22 DIAGNOSIS — R7303 Prediabetes: Secondary | ICD-10-CM | POA: Diagnosis not present

## 2022-05-22 DIAGNOSIS — E782 Mixed hyperlipidemia: Secondary | ICD-10-CM | POA: Diagnosis not present

## 2022-05-25 ENCOUNTER — Ambulatory Visit: Payer: Medicare HMO | Admitting: Cardiovascular Disease

## 2022-06-04 DIAGNOSIS — S61411D Laceration without foreign body of right hand, subsequent encounter: Secondary | ICD-10-CM | POA: Diagnosis not present

## 2022-06-04 DIAGNOSIS — S41111D Laceration without foreign body of right upper arm, subsequent encounter: Secondary | ICD-10-CM | POA: Diagnosis not present

## 2022-06-04 DIAGNOSIS — W548XXD Other contact with dog, subsequent encounter: Secondary | ICD-10-CM | POA: Diagnosis not present

## 2022-06-29 DIAGNOSIS — K219 Gastro-esophageal reflux disease without esophagitis: Secondary | ICD-10-CM | POA: Diagnosis not present

## 2022-06-29 DIAGNOSIS — Z79899 Other long term (current) drug therapy: Secondary | ICD-10-CM | POA: Diagnosis not present

## 2022-06-29 DIAGNOSIS — B029 Zoster without complications: Secondary | ICD-10-CM | POA: Diagnosis not present

## 2022-07-13 ENCOUNTER — Telehealth: Payer: Self-pay | Admitting: Cardiovascular Disease

## 2022-07-13 NOTE — Telephone Encounter (Signed)
Pt is calling regarding her pacemaker. She is scheduled for an overdue f/u with Dr. Royann Shivers on 5/2, she was due January 2023. She just wants to make sure she is doing all that she needs to do for herself and for her heart. Please advise.

## 2022-07-14 NOTE — Telephone Encounter (Signed)
Called patient left message on personal voice mail continue all medications as prescribed.Keep appointment already scheduled with Dr.Croitoru 5/2 at 8:20 am.

## 2022-07-23 ENCOUNTER — Ambulatory Visit: Payer: Medicare HMO | Admitting: Cardiovascular Disease

## 2022-08-22 ENCOUNTER — Ambulatory Visit
Admission: EM | Admit: 2022-08-22 | Discharge: 2022-08-22 | Disposition: A | Discharge status: Home / Self Care | Payer: Medicare HMO | Attending: Nurse Practitioner | Admitting: Nurse Practitioner

## 2022-08-22 ENCOUNTER — Ambulatory Visit (INDEPENDENT_AMBULATORY_CARE_PROVIDER_SITE_OTHER): Payer: Medicare HMO

## 2022-08-22 DIAGNOSIS — S62112A Displaced fracture of triquetrum [cuneiform] bone, left wrist, initial encounter for closed fracture: Secondary | ICD-10-CM | POA: Diagnosis not present

## 2022-08-22 DIAGNOSIS — M25531 Pain in right wrist: Secondary | ICD-10-CM

## 2022-08-22 DIAGNOSIS — M7989 Other specified soft tissue disorders: Secondary | ICD-10-CM | POA: Diagnosis not present

## 2022-08-22 NOTE — Discharge Instructions (Signed)
Your left wrist is broken.  Please wear the splint until you are able to follow-up with an orthopedic doctor.  You can take Tylenol 500 to 1000 mg every 6 hours as needed for pain.  Keep your wrist elevated when sitting down and apply ice 15 minutes on, 45 minutes off every hour while awake.   The right wrist is not broken.

## 2022-08-22 NOTE — ED Provider Notes (Signed)
[facility]    CSN: [id number] Arrival date & time: 08/22/22  0827      History   Chief Complaint Chief Complaint  Patient presents with   Fall    HPI Denise Pugh is a 89 y.o. female.   Patient presents today for bilateral wrist pain after fall yesterday morning.  Reports she was getting out of bed and fell backwards, landing on her buttock.  She reports she tried to catch herself with her arms and is not having significant wrist pain in both wrists, worse in the left wrist.  She denies numbness or tingling in the fingers of either wrist.  She reports they are both bruised and the left one is swollen and a little bit red.  She denies decreased range of motion to right wrist, however does have difficulty moving the left wrist because of pain.  Has taken Tylenol with minimal improvement.    Past Medical History:  Diagnosis Date   Arrhythmia    HX of SVT. CARDIONET MONITOR 07/15/12 TO 08/13/12   Arthritis    Family history of adverse reaction to anesthesia 1970s   mother   GERD (gastroesophageal reflux disease)    Hyperlipidemia 08/06/11   lexiscan myoview-normal. Due to severe attenuation the study specificity and sensitivty are deminished.   Hypertension 08/25/10   ECHO-EF 60-65%   Hypothyroidism    Irregular heart beat    Shortness of breath    Sleep apnea    SLEEP STUDY- [facility]   Thyroid disease     Patient Active Problem List   Diagnosis Date Noted   Osteoarthritis of left knee 03/05/2021   Osteoarthritis of right knee 03/05/2021   Apical variant hypertrophic cardiomyopathy (HCC) 04/26/2018   Pacemaker 04/26/2018   Other persistent atrial fibrillation (HCC)    Persistent atrial fibrillation (HCC)    Neck pain 05/15/2017   First degree AV block 01/13/2016   Long term current use of anticoagulant 01/13/2016   Anticoagulation adequate 02/12/2015   Hyperlipidemia LDL goal <70 02/28/2014   Tachycardia-bradycardia syndrome (HCC)  07/21/2013   Bradycardia, drug induced 07/20/2013   Symptomatic sinus bradycardia 07/20/2013   S/P cardiac pacemaker procedure: Dr. Royann Shivers: Generator [employer] Advisa MRI  07/20/2013    Class: Hospitalized for   Atrial fibrillation with RVR (HCC) 07/18/2013   Bradycardia, also idioventricular rhythm  07/18/2013   PAF (paroxysmal atrial fibrillation) (HCC) 06/26/2013   Grieving 05/29/2013   CAP (community acquired pneumonia) 04/08/2011   Hypothyroidism (acquired) 04/08/2011   HAMSTRING TENDINITIS 06/15/2008   RHINITIS, CHRONIC 04/30/2008   HIP PAIN, RIGHT 04/30/2008   KNEE PAIN, BILATERAL 04/30/2008   INTERTRIGO, CANDIDAL 01/05/2008   DIARRHEA 01/05/2008   COSTOCHONDRITIS 06/29/2007   HYPERLIPIDEMIA 09/30/2006   FATIGUE 09/30/2006   PARESTHESIA 09/30/2006   GASTRIC POLYP 03/14/2006   HYPOTHYROIDISM 03/14/06   DEPRESSION, recent death of her husband Mar 14, 2006   Essential hypertension 03/14/06   SUPRAVENTRICULAR TACHYCARDIA Mar 14, 2006   GERD 2006-03-14   MELANOSIS COLI 14-Mar-2006   OSTEOARTHRITIS 03-14-2006   LOW BACK PAIN 2006-03-14   DIVERTICULITIS, HX OF 03-14-06    Past Surgical History:  Procedure Laterality Date   CARDIAC CATHETERIZATION  07/06/01   spade-like configuration   CARDIOVERSION N/A 06/07/2017   Procedure: CARDIOVERSION;  Surgeon: Thurmon Fair, MD;  Location: [facility];  Service: Cardiovascular;  Laterality: N/A;   CARDIOVERSION N/A 04/14/2018   Procedure: CARDIOVERSION;  Surgeon: Thurmon Fair, MD;  Location: [facility];  Service: Cardiovascular;  Laterality: N/A;  DILATION AND CURETTAGE OF UTERUS     EYE SURGERY     KNEE ARTHROSCOPY     PERMANENT PACEMAKER INSERTION N/A 07/20/2013   Procedure: PERMANENT PACEMAKER INSERTION;  Surgeon: Thurmon Fair, MD;  Location: [facility];  Service: Cardiovascular;  Laterality: N/A;    OB History     Gravida      Para      Term      Preterm      AB      Living  1      SAB      IAB       Ectopic      Multiple      Live Births               Home Medications    Prior to Admission medications   Medication Sig Start Date End Date Taking? Authorizing Provider  acetaminophen (TYLENOL) 500 MG tablet Take 500 mg by mouth every 6 (six) hours as needed for moderate pain, fever or headache.     [provider]  amiodarone (PACERONE) 200 MG tablet     [provider]  apixaban (ELIQUIS) 5 MG TABS tablet Take 1 tablet (5 mg total) by mouth 2 (two) times daily. 11/28/21   Croitoru, Mihai, MD  atorvastatin (LIPITOR) 20 MG tablet TAKE 1/2 TABLET EVERY DAY 03/24/22   Lennette Bihari, MD  buPROPion (WELLBUTRIN XL) 150 MG 24 hr tablet     [provider]  buPROPion (WELLBUTRIN) 75 MG tablet Take 75 mg by mouth 2 (two) times daily. 10/07/20   [provider]  Cholecalciferol (VITAMIN D) 2000 UNITS tablet Take 2,000 Units by mouth daily.    [provider]  diclofenac Sodium (VOLTAREN) 1 % GEL Apply topically as needed. 1% gel as needed for pain 03/15/19   [provider]  digoxin (LANOXIN) 0.125 MG tablet TAKE 1 TABLET EVERY DAY 03/03/22   Lennette Bihari, MD  DULoxetine (CYMBALTA) 30 MG capsule Take 30 mg by mouth daily. 10/27/21   [provider]  escitalopram (LEXAPRO) 20 MG tablet TAKE 1 TABLET BY MOUTH  DAILY 08/06/20   Croitoru, Mihai, MD  esomeprazole (NEXIUM) 40 MG capsule Take 40 mg by mouth daily.    [provider]  hydrochlorothiazide (MICROZIDE) 12.5 MG capsule TAKE 1 CAPSULE EVERY DAY 03/03/22   Lennette Bihari, MD  levothyroxine (SYNTHROID) 88 MCG tablet Take 88 mcg by mouth daily. 11/28/21   [provider]  Liniments (DEEP BLUE RELIEF EX) Apply 1 application topically daily as needed (for back, shoulder and knee pain). Patient not taking: Reported on 04/07/2021    [provider]  metoprolol tartrate (LOPRESSOR) 100 MG tablet TAKE 1 TABLET EVERY MORNING AND TAKE 1 TABLET EVERY EVENING  02/04/22   Lennette Bihari, MD  Multiple Vitamins-Minerals (PRESERVISION AREDS 2 PO) Take 2 tablets by mouth daily.    [provider]  mupirocin ointment (BACTROBAN) 2 % Place 1 application into the nose daily as needed (for dry nose). Patient not taking: Reported on 04/07/2021    [provider]  nystatin (MYCOSTATIN/NYSTOP) powder Apply topically 2 (two) times daily. Pt uses as needed. Patient not taking: Reported on 04/07/2021 09/21/19   [provider]  OVER THE COUNTER MEDICATION Place 1 drop into both ears daily. Organic Ear Oil Pt uses as needed Patient not taking: Reported on 04/07/2021    [provider]  oxybutynin (DITROPAN) 5 MG tablet Take  5 mg by mouth 2 (two) times daily.  01/21/15   [provider]  sodium chloride (OCEAN) 0.65 % SOLN nasal spray Place 1 spray into both nostrils as needed for congestion. Patient not taking: Reported on 04/07/2021    [provider]  traMADol (ULTRAM) 50 MG tablet Take 100 mg by mouth 2 (two) times daily.  08/09/12   [provider]    Family History Family History  Problem Relation Age of Onset   Diabetes Mother    Hypertension Father    Diabetes Father    Heart attack Maternal Grandmother    Heart attack Maternal Grandfather     Social History Social History   Tobacco Use   Smoking status: Never   Smokeless tobacco: Never  Vaping Use   Vaping Use: Never used  Substance Use Topics   Alcohol use: No    Alcohol/week: 0.0 standard drinks of alcohol   Drug use: No     Allergies   Penicillins and Tetanus toxoid   Review of Systems Review of Systems Per HPI  Physical Exam Triage Vital Signs ED Triage Vitals [08/22/22 0851]  Enc Vitals Group     BP (!) 146/81     Pulse Rate 65     Resp 16     Temp 98.6 F (37 C)     Temp Source Oral     SpO2 92 %     Weight      Height      Head Circumference      Peak Flow      Pain Score 10     Pain Loc      Pain Edu?       Excl. in GC?    No data found.  Updated Vital Signs BP (!) 146/81 (BP Location: Left Arm)   Pulse 65   Temp 98.6 F (37 C) (Oral)   Resp 16   SpO2 92%   Visual Acuity Right Eye Distance:   Left Eye Distance:   Bilateral Distance:    Right Eye Near:   Left Eye Near:    Bilateral Near:     Physical Exam Vitals and nursing note reviewed.  Constitutional:      General: She is not in acute distress.    Appearance: Normal appearance. She is not toxic-appearing.  HENT:     Mouth/Throat:     Mouth: Mucous membranes are moist.     Pharynx: Oropharynx is clear.  Pulmonary:     Effort: Pulmonary effort is normal. No respiratory distress.  Musculoskeletal:     Right wrist: Swelling, tenderness, bony tenderness and snuff box tenderness present. No deformity, effusion or lacerations. Decreased range of motion. Normal pulse.     Left wrist: Swelling, tenderness and bony tenderness present. No snuff box tenderness. Normal range of motion. Normal pulse.     Comments: Inspection: Bilateral wrists are bruised and left wrist is slightly edematous and erythematous.  No obvious deformity appreciated. Palpation: Bilateral wrists are tenderness to palpation diffusely; no warmth;  ROM: Full active range of motion to right wrist; left wrist difficult to perform secondary to pain Strength: Unable to perform secondary to pain Neurovascular: neurovascularly intact in left and right upper extremity   Skin:    General: Skin is warm and dry.     Capillary Refill: Capillary refill takes less than 2 seconds.     Coloration: Skin is not jaundiced or pale.     Findings: No erythema.  Neurological:     Mental Status: She is alert and oriented to person, place, and time.  Psychiatric:        Behavior: Behavior is cooperative.      UC Treatments / Results  Labs (all labs ordered are listed, but only abnormal results are displayed) Labs Reviewed - No data to display  EKG   Radiology DG  Wrist Complete Right  Result Date: 08/22/2022 CLINICAL DATA:  89 year old female with wrist pain after a fall yesterday. EXAM: RIGHT WRIST - COMPLETE 3+ VIEW COMPARISON:  Series the same day.  Contralateral left wrist FINDINGS: Soft tissue swelling. Distal radius and ulna appear intact. No carpal dislocation. Similar radial aspect distal carpal row and 1st CMC joint degeneration, osteoarthritis. No convincing carpal bone fracture fragment, some degenerative dystrophic calcifications suspected about the carpal bones. Metacarpals appear intact. IMPRESSION: Soft tissue swelling and degenerative changes with no acute fracture or dislocation identified about the right wrist. Electronically Signed   By: Odessa Fleming M.D.   On: 08/22/2022 09:22   DG Wrist Complete Left  Result Date: 08/22/2022 CLINICAL DATA:  89 year old female with wrist pain after a fall yesterday. EXAM: LEFT WRIST - COMPLETE 3+ VIEW COMPARISON:  None Available. FINDINGS: 4 mm dorsal carpal bone acute fracture fragment on the lateral view with underlying soft tissue swelling. Distal radius and ulna appear intact. Additional probably degenerative dystrophic calcification along the proximal carpal row. Degenerative joint space loss at the radial aspect of the distal carpal row and 1st CMC joint. Mild periarticular erosions associated, as well as osteophytosis. No carpal dislocation. Proximal metacarpals intact. IMPRESSION: 1. Displaced 4 mm fracture fragment dorsal to the carpal bones, most commonly an acute triquetrum fracture. 2. Degenerative changes. No other acute fracture or dislocation in the left wrist. Electronically Signed   By: Odessa Fleming M.D.   On: 08/22/2022 09:21    Procedures Procedures (including critical care time)  Medications Ordered in UC Medications - No data to display  Initial Impression / Assessment and Plan / UC Course  I have reviewed the triage vital signs and the nursing notes.  Pertinent labs & imaging results that were  available during my care of the patient were reviewed by me and considered in my medical decision making (see chart for details).   Patient is well-appearing, normotensive, afebrile, not tachycardic, not tachypneic, oxygenating well on room air.    1. Closed displaced fracture of triquetrum of left wrist, initial encounter Volar splint applied Recommended ice, elevation, Tylenol as needed for pain Patient unable to take NSAIDs secondary to Eliquis use Recommended follow-up with orthopedic provider and contact information given  2. Acute pain of right wrist Right wrist x-ray is negative for acute bony abnormality Recommended rest, ice, compression, elevation, Tylenol as needed for pain  The patient was given the opportunity to ask questions.  All questions answered to their satisfaction.  The patient is in agreement to this plan.    Final Clinical Impressions(s) / UC Diagnoses   Final diagnoses:  Closed displaced fracture of triquetrum of left wrist, initial encounter  Acute pain of right wrist     Discharge Instructions      Your left wrist is broken.  Please wear the splint until you are able to follow-up with an orthopedic doctor.  You can take Tylenol 500 to 1000 mg every 6 hours as needed for pain.  Keep your wrist elevated when sitting down and apply ice 15 minutes on, 45 minutes off every hour  while awake.   The right wrist is not broken.    ED Prescriptions   None    PDMP not reviewed this encounter.   Valentino Nose, NP 08/22/22 1056

## 2022-08-22 NOTE — ED Triage Notes (Addendum)
Pt states she fell yesterday morning denies LOC states she lost her balance.  Pt C/O bilateral wrist and arm pain worse on the left.  Limited Rom due to pain and injury. Both hands and wrists bruised.  Pt is on Eliquis. States she has been putting ice on her wrists and taking Tylenol at home.

## 2022-08-24 DIAGNOSIS — M25532 Pain in left wrist: Secondary | ICD-10-CM | POA: Diagnosis not present

## 2022-09-03 ENCOUNTER — Other Ambulatory Visit: Payer: Self-pay | Admitting: Cardiovascular Disease

## 2022-09-04 DIAGNOSIS — S62112D Displaced fracture of triquetrum [cuneiform] bone, left wrist, subsequent encounter for fracture with routine healing: Secondary | ICD-10-CM | POA: Diagnosis not present

## 2022-09-04 DIAGNOSIS — S63502D Unspecified sprain of left wrist, subsequent encounter: Secondary | ICD-10-CM | POA: Diagnosis not present

## 2022-10-15 ENCOUNTER — Ambulatory Visit (HOSPITAL_COMMUNITY)
Admission: AD | Admit: 2022-10-15 | Discharge: 2022-10-15 | Disposition: A | Discharge status: Home / Self Care | Payer: Medicare HMO | Source: Ambulatory Visit | Attending: Cardiovascular Disease | Admitting: Cardiovascular Disease

## 2022-10-15 ENCOUNTER — Other Ambulatory Visit: Payer: Self-pay

## 2022-10-15 ENCOUNTER — Encounter: Payer: Self-pay | Admitting: Cardiovascular Disease

## 2022-10-15 ENCOUNTER — Encounter (HOSPITAL_COMMUNITY)
Admission: AD | Disposition: A | Discharge status: Home / Self Care | Payer: Self-pay | Source: Ambulatory Visit | Attending: Cardiovascular Disease

## 2022-10-15 ENCOUNTER — Ambulatory Visit: Payer: Medicare HMO | Attending: Cardiovascular Disease | Admitting: Cardiovascular Disease

## 2022-10-15 VITALS — BP 128/64 | HR 65 | Ht 63.0 in | Wt 152.8 lb

## 2022-10-15 DIAGNOSIS — I422 Other hypertrophic cardiomyopathy: Secondary | ICD-10-CM | POA: Insufficient documentation

## 2022-10-15 DIAGNOSIS — E785 Hyperlipidemia, unspecified: Secondary | ICD-10-CM | POA: Insufficient documentation

## 2022-10-15 DIAGNOSIS — I4891 Unspecified atrial fibrillation: Secondary | ICD-10-CM | POA: Diagnosis not present

## 2022-10-15 DIAGNOSIS — R001 Bradycardia, unspecified: Secondary | ICD-10-CM | POA: Insufficient documentation

## 2022-10-15 DIAGNOSIS — I10 Essential (primary) hypertension: Secondary | ICD-10-CM | POA: Insufficient documentation

## 2022-10-15 DIAGNOSIS — G4733 Obstructive sleep apnea (adult) (pediatric): Secondary | ICD-10-CM | POA: Insufficient documentation

## 2022-10-15 DIAGNOSIS — Z7901 Long term (current) use of anticoagulants: Secondary | ICD-10-CM | POA: Diagnosis not present

## 2022-10-15 DIAGNOSIS — D6859 Other primary thrombophilia: Secondary | ICD-10-CM | POA: Insufficient documentation

## 2022-10-15 DIAGNOSIS — I4821 Permanent atrial fibrillation: Secondary | ICD-10-CM

## 2022-10-15 DIAGNOSIS — I495 Sick sinus syndrome: Secondary | ICD-10-CM | POA: Insufficient documentation

## 2022-10-15 DIAGNOSIS — Z45010 Encounter for checking and testing of cardiac pacemaker pulse generator [battery]: Secondary | ICD-10-CM

## 2022-10-15 DIAGNOSIS — D6869 Other thrombophilia: Secondary | ICD-10-CM

## 2022-10-15 HISTORY — PX: PPM GENERATOR CHANGEOUT: EP1233

## 2022-10-15 LAB — CBC
HCT: 43.5 % (ref 36.0–46.0)
Hemoglobin: 14.5 g/dL (ref 12.0–15.0)
MCH: 30.5 pg (ref 26.0–34.0)
MCHC: 33.3 g/dL (ref 30.0–36.0)
MCV: 91.6 fL (ref 80.0–100.0)
Platelets: 190 10*3/uL (ref 150–400)
RBC: 4.75 MIL/uL (ref 3.87–5.11)
RDW: 14.3 % (ref 11.5–15.5)
WBC: 9.1 10*3/uL (ref 4.0–10.5)
nRBC: 0 % (ref 0.0–0.2)

## 2022-10-15 LAB — BASIC METABOLIC PANEL
Anion gap: 10 (ref 5–15)
BUN: 15 mg/dL (ref 8–23)
CO2: 28 mmol/L (ref 22–32)
Calcium: 8.9 mg/dL (ref 8.9–10.3)
Chloride: 96 mmol/L — ABNORMAL LOW (ref 98–111)
Creatinine, Ser: 0.92 mg/dL (ref 0.44–1.00)
GFR, Estimated: 59 mL/min — ABNORMAL LOW (ref >60)
Glucose, Bld: 139 mg/dL — ABNORMAL HIGH (ref 70–99)
Potassium: 3.3 mmol/L — ABNORMAL LOW (ref 3.5–5.1)
Sodium: 134 mmol/L — ABNORMAL LOW (ref 135–145)

## 2022-10-15 SURGERY — PPM GENERATOR CHANGEOUT

## 2022-10-15 MED ORDER — LIDOCAINE HCL (PF) 1 % IJ SOLN
INTRAMUSCULAR | Status: AC
  Filled 2022-10-15: qty 60

## 2022-10-15 MED ORDER — ACETAMINOPHEN 325 MG PO TABS: 325.0000 mg | ORAL_TABLET | ORAL | Status: DC | PRN

## 2022-10-15 MED ORDER — VANCOMYCIN HCL IN DEXTROSE 1-5 GM/200ML-% IV SOLN
INTRAVENOUS | Status: AC
  Filled 2022-10-15: qty 200

## 2022-10-15 MED ORDER — SODIUM CHLORIDE 0.9 % IV SOLN: INTRAVENOUS | Status: DC

## 2022-10-15 MED ORDER — ONDANSETRON HCL 4 MG/2ML IJ SOLN: 4.0000 mg | Freq: Four times a day (QID) | INTRAMUSCULAR | Status: DC | PRN

## 2022-10-15 MED ORDER — VANCOMYCIN HCL IN DEXTROSE 1-5 GM/200ML-% IV SOLN
1000.0000 mg | INTRAVENOUS | Status: AC
  Administered 2022-10-15: 1000 mg via INTRAVENOUS

## 2022-10-15 MED ORDER — SODIUM CHLORIDE 0.9 % IV SOLN
80.0000 mg | INTRAVENOUS | Status: AC
  Administered 2022-10-15: 80 mg

## 2022-10-15 MED ORDER — POVIDONE-IODINE 10 % EX SWAB
2.0000 | Freq: Once | CUTANEOUS | Status: AC
  Administered 2022-10-15: 2 via TOPICAL

## 2022-10-15 MED ORDER — SODIUM CHLORIDE 0.9 % IV SOLN
INTRAVENOUS | Status: AC
  Filled 2022-10-15: qty 2

## 2022-10-15 MED ORDER — LIDOCAINE HCL (PF) 1 % IJ SOLN
INTRAMUSCULAR | Status: DC | PRN
  Administered 2022-10-15: 60 mL via INTRADERMAL

## 2022-10-15 SURGICAL SUPPLY — 7 items
CABLE SURGICAL S-101-97-12 (CABLE) ×1 IMPLANT
IPG PACE AZUR XT DR MRI W1DR01 (Pacemaker) IMPLANT
PACE AZURE XT DR MRI W1DR01 (Pacemaker) ×1 IMPLANT
PAD DEFIB RADIO PHYSIO CONN (PAD) ×1 IMPLANT
POUCH AIGIS-R ANTIBACT PPM (Mesh General) ×1 IMPLANT
POUCH AIGIS-R ANTIBACT PPM MED (Mesh General) IMPLANT
TRAY PACEMAKER INSERTION (PACKS) ×1 IMPLANT

## 2022-10-15 NOTE — Interval H&P Note (Signed)
History and Physical Interval Note:  10/15/2022 12:57 PM  Denise Pugh  has presented today for surgery, with the diagnosis of afib.  The various methods of treatment have been discussed with the patient and family. After consideration of risks, benefits and other options for treatment, the patient has consented to  Procedure(s): PPM GENERATOR CHANGEOUT (N/A) as a surgical intervention.  The patient's history has been reviewed, patient examined, no change in status, stable for surgery.  I have reviewed the patient's chart and labs.  Questions were answered to the patient's satisfaction.     Rosalba Totty

## 2022-10-15 NOTE — H&P (View-Only) (Signed)
Cardiology office note   Date:  10/15/2022   ID:  Denise Pugh, DOB [birth year], MRN [id number]  PCP:  Benita Stabile, MD  Cardiologist:  Nicki Guadalajara, MD  Electrophysiologist:  None   Chief Complaint: Pacemaker follow-up  History of Present Illness:    Denise Pugh is a 89 y.o. female with a history of tachycardia-bradycardia syndrome for which she received a dual-chamber permanent pacemaker, subsequently reprogrammed VVIR due to persistent atrial fibrillation (over 2 years now).  Additional medical problems include systemic hypertension, hyperlipidemia, obstructive sleep apnea and apical variant hypertrophic cardiomyopathy (which has not been associated with heart failure or ventricular arrhythmia).  She presents today for her first appointment since January 2023.  Several other appointments were canceled or rescheduled.  She has not performed her pacemaker downloads since 03/24/2022.  Interrogation of her device shows that it reached end of service.  Directly reached replacement trigger on 04/11/2022.  Both leads are Medtronic [id number] leads.  The generator is Medtronic advisor device implanted in 2015.  Battery voltage is 2.76 V (RRT 2.83 V).  Thankfully the device is still providing appropriate pacing.  She no longer has rate response on.  The device is programmed VVI at 65 bpm.  She does require 90% ventricular pacing even at this rate.  Thankfully she is not device dependent, with an underlying rhythm of atrial fibrillation with slow ventricular response in the 50 bpm range.  Did not check lead parameters manually today to avoid further battery depletion, but most recent capture threshold was 0.75 V at 0.4 ms.  Sensing remains excellent as does the lead impedance.  There have been no episodes of high ventricular rate.  She continues to live independently, but her daughter points out that she is becoming much more forgetful.  Overall she is doing quite well without complaints of shortness of  breath, chest pain, leg edema, palpitations or syncope.  She did fall when she jumped out of bed once to answer the phone.  She simply lost her balance and fell backwards.  She had a metacarpal phalangeal fracture.  She did not have head injury.  She has not had any serious bleeding issues.  She remains compliant with Eliquis.    Past Medical History:  Diagnosis Date   Arrhythmia    HX of SVT. CARDIONET MONITOR 07/15/12 TO 08/13/12   Arthritis    Family history of adverse reaction to anesthesia 1970s   mother   GERD (gastroesophageal reflux disease)    Hyperlipidemia 08/06/11   lexiscan myoview-normal. Due to severe attenuation the study specificity and sensitivty are deminished.   Hypertension 08/25/10   ECHO-EF 60-65%   Hypothyroidism    Irregular heart beat    Shortness of breath    Sleep apnea    SLEEP STUDY-[facility]   Thyroid disease    Past Surgical History:  Procedure Laterality Date   CARDIAC CATHETERIZATION  07/06/01   spade-like configuration   CARDIOVERSION N/A 06/07/2017   Procedure: CARDIOVERSION;  Surgeon: Thurmon Fair, MD;  Location: [facility];  Service: Cardiovascular;  Laterality: N/A;   CARDIOVERSION N/A 04/14/2018   Procedure: CARDIOVERSION;  Surgeon: Thurmon Fair, MD;  Location: [facility];  Service: Cardiovascular;  Laterality: N/A;   DILATION AND CURETTAGE OF UTERUS     EYE SURGERY     KNEE ARTHROSCOPY     PERMANENT PACEMAKER INSERTION N/A 07/20/2013   Procedure: PERMANENT PACEMAKER INSERTION;  Surgeon: Thurmon Fair, MD;  Location: [facility];  Service: Cardiovascular;  Laterality: N/A;     Current Meds  Medication Sig   amiodarone (PACERONE) 200 MG tablet    apixaban (ELIQUIS) 5 MG TABS tablet Take 1 tablet (5 mg total) by mouth 2 (two) times daily.   atorvastatin (LIPITOR) 20 MG tablet TAKE 1/2 TABLET EVERY DAY   buPROPion (WELLBUTRIN XL) 150 MG 24 hr tablet 150 mg daily.   Cholecalciferol (VITAMIN D) 2000 UNITS tablet  Take 2,000 Units by mouth daily.   diclofenac Sodium (VOLTAREN) 1 % GEL Apply topically as needed. 1% gel as needed for pain   digoxin (LANOXIN) 0.125 MG tablet TAKE 1 TABLET EVERY DAY   escitalopram (LEXAPRO) 20 MG tablet TAKE 1 TABLET BY MOUTH  DAILY   esomeprazole (NEXIUM) 40 MG capsule Take 40 mg by mouth daily.   hydrochlorothiazide (MICROZIDE) 12.5 MG capsule TAKE 1 CAPSULE EVERY DAY   levothyroxine (SYNTHROID) 88 MCG tablet Take 88 mcg by mouth daily.   Liniments (DEEP BLUE RELIEF EX) Apply 1 application  topically daily as needed (for back, shoulder and knee pain).   metoprolol tartrate (LOPRESSOR) 100 MG tablet TAKE (1) TABLET BY MOUTH TWICE DAILY.   Multiple Vitamins-Minerals (PRESERVISION AREDS 2 PO) Take 2 tablets by mouth daily.   oxybutynin (DITROPAN) 5 MG tablet Take 5 mg by mouth 2 (two) times daily.    traMADol (ULTRAM) 50 MG tablet Take 100 mg by mouth 2 (two) times daily.      Allergies:   Penicillins and Tetanus toxoid   Social History   Tobacco Use   Smoking status: Never   Smokeless tobacco: Never  Vaping Use   Vaping status: Never Used  Substance Use Topics   Alcohol use: No    Alcohol/week: 0.0 standard drinks of alcohol   Drug use: No     Family Hx: The patient's family history includes Diabetes in her father and mother; Heart attack in her maternal grandfather and maternal grandmother; Hypertension in her father.  ROS:   Please see the history of present illness.   All other systems are reviewed and are negative.   Prior CV studies:   The following studies were reviewed today:  Comprehensive pacemaker download performed in the office today  Labs/Other Tests and Data Reviewed:    EKG: Was not performed today.  Intracardiac electrogram shows atrial sensed (atrial fibrillation), ventricular paced rhythm.  Recent Labs: No results found for requested labs within last 365 days.   08/07/2020 TSH 4.10, creatinine 0.8, hemoglobin 14.8, hemoglobin A1c  5.9% 08/07/2020 Cholesterol 112, HDL 42, LDL 51  Recent Lipid Panel Lab Results  Component Value Date/Time   CHOL 120 07/19/2013 05:39 AM   TRIG 52 07/19/2013 05:39 AM   HDL 45 07/19/2013 05:39 AM   CHOLHDL 2.7 07/19/2013 05:39 AM   LDLCALC 65 07/19/2013 05:39 AM   08/07/2020  cholesterol 112, HDL 42, LDL 51, triglycerides 101  Wt Readings from Last 3 Encounters:  10/15/22 152 lb 12.8 oz (69.3 kg)  04/07/21 159 lb (72.1 kg)  01/21/21 160 lb (72.6 kg)     Objective:    Vital Signs:  BP 128/64 (BP Location: Left Arm, Patient Position: Sitting, Cuff Size: Normal)   Pulse 65   Ht 5\' 3"  (1.6 m)   Wt 152 lb 12.8 oz (69.3 kg)   SpO2 90%   BMI 27.07 kg/m      General: Alert, oriented x3, no distress, healthy left subclavian pacemaker site Head: no evidence of trauma, PERRL, EOMI, no  exophtalmos or lid lag, no myxedema, no xanthelasma; normal ears, nose and oropharynx Neck: normal jugular venous pulsations and no hepatojugular reflux; brisk carotid pulses without delay and no carotid bruits Chest: clear to auscultation, no signs of consolidation by percussion or palpation, normal fremitus, symmetrical and full respiratory excursions Cardiovascular: normal position and quality of the apical impulse, regular rhythm, normal first and second heart sounds, no murmurs, rubs or gallops Abdomen: no tenderness or distention, no masses by palpation, no abnormal pulsatility or arterial bruits, normal bowel sounds, no hepatosplenomegaly Extremities: no clubbing, cyanosis or edema; 2+ radial, ulnar and brachial pulses bilaterally; 2+ right femoral, posterior tibial and dorsalis pedis pulses; 2+ left femoral, posterior tibial and dorsalis pedis pulses; no subclavian or femoral bruits Neurological: grossly nonfocal Psych: Normal mood and affect     ASSESSMENT & PLAN:    1. Permanent atrial fibrillation (HCC)   2. Essential hypertension   3. Symptomatic sinus bradycardia   4. Pacemaker  battery depletion   5. Acquired thrombophilia (HCC)        AFib: Permanent arrhythmia with controlled ventricular response, appropriately anticoagulated.  CHA2DS2-VASc 4-6 (age 24, gender, HTN +/-cardiomyopathy, +/-prediabetes).  In the past with a difficult time with ventricular rate control and with broad oscillations between RVR and excessive pacing.  We had to use digoxin in addition to beta-blockers.  I think we will stop the digoxin currently since she is pacing 90% of the time. PPM: The device reached RRT about 6 months ago.  Thankfully she is not pacemaker dependent and the device is still providing adequate pacing.  Discussed generator change out and we will schedule that for later today. Apical HCM: She has not had clinical evidence of heart failure or ventricular arrhythmia.  No evidence of LV outflow tract obstruction by echo.  The cardiomyopathy may be associated with diastolic dysfunction. Anticoagulation: She had only 1 fall in the last several months, thankfully without serious bleeding complications and without head injury.  Continue anticoagulation at least for the time being. HTN: Well-controlled HLP: Excellent parameters earlier this year.  This procedure has been fully reviewed with the patient and written informed consent has been obtained.   Patient Instructions     Implantable Device Instructions    Denise Pugh  10/15/2022  You are scheduled for a PPM generator change on Thursday, July 25 with Dr. Rachelle Hora Givanni Staron.  1. Pre procedure Lab testing:  Get lab work at hospital pre-procedure  2. Please arrive at the [facility] (Main Entrance A) at [facility]: [address] [place], [place] [address] at 1:30 PM (This time is 2 hour(s) before your procedure to ensure your preparation). Free valet parking service is available. You will check in at ADMITTING. The support person will be asked to wait in the waiting room.  It is OK to have someone drop you off and  come back when you are ready to be discharged.          Special note: Every effort is made to have your procedure done on time. Please understand that emergencies sometimes delay  scheduled procedures.  3.  No eating or drinking after midnight prior to procedure.     4.  Medication instructions:  On the morning of your procedure     !!IF ANY NEW MEDICATIONS ARE STARTED AFTER TODAY, PLEASE NOTIFY YOUR PROVIDER AS SOON AS POSSIBLE!!  FYI: Medications such as Semaglutide (Ozempic, [place]), Tirzepatide (Mounjaro, Zepbound), Dulaglutide (Trulicity), etc ("GLP1 agonists") AND Canagliflozin (Invokana),  Dapagliflozin Marcelline Deist), Empagliflozin (Jardiance), Ertugliflozin (Steglatro), Bexagliflozin [employer]) or any combination with one of these drugs such as Invokamet (Canagliflozin/Metformin), Synjardy (Empagliflozin/Metformin), etc ("SGLT2 inhibitors") must be held around the time of a procedure. This is not a comprehensive list of all of these drugs. Please review all of your medications and talk to your provider if you take any one of these. If you are not sure, ask your provider.   5.  The night before your procedure and the morning of your procedure scrub your neck/chest with CHG surgical scrub.  See instruction letter.  6. Plan to go home the same day, you will only stay overnight if medically necessary. 7. You MUST have a responsible adult to drive you home. 8. An adult MUST be with you the first 24 hours after you arrive home. 9. Bring a current list of your medications, and the last time and date medication taken. 10. Bring ID and current insurance cards. 11. Please wear clothes that are easy to get on and off and wear slip-on shoes.    You will follow up with the [facility] Device clinic 10-14 days after your procedure.  You will follow up with Dr. Rachelle Hora Janiaya Ryser 91 days after your procedure.  These appointments will be made for you.   * If you have ANY questions after you get home, please  call the office at [phone] or send a MyChart message.  FYI: For your safety, and to allow [place] to monitor your vital signs accurately during the surgery/procedure we request that if you have artificial nails, gel coating, SNS etc. Please have those removed prior to your surgery/procedure. Not having the nail coverings /polish removed may result in cancellation or delay of your surgery/procedure.     - Preparing For Surgery    Before surgery, you can play an important role. Because skin is not sterile, your skin needs to be as free of germs as possible. You can reduce the number of germs on your skin by washing with CHG (chlorahexidine gluconate) Soap before surgery.  CHG is an antiseptic cleaner which kills germs and bonds with the skin to continue killing germs even after washing.  Please do not use if you have an allergy to CHG or antibacterial soaps.  If your skin becomes reddened/irritated stop using the CHG.   Do not shave (including legs and underarms) for at least 48 hours prior to first CHG shower.  It is OK to shave your face.  Please follow these instructions carefully: 1. Shower the night before surgery and the morning of surgery with CHG.  2. If you choose to wash your hair, wash your hair first as usual with your normal shampoo.  3. After you shampoo, rinse your hair and body thoroughly to remove the shampoo.  4. Use CHG as you would any other liquid soap.  You can apply CHG directly to the skin and wash gently with a clean washcloth.  5. Apply the CHG Soap to your body ONLY FROM THE NECK DOWN.  Do not use on open wounds or open sores.  Avoid contact with your eyes, ears, mouth and genitals (private parts).  Wash genitals (private parts) with your normal soap.  6. Wash thoroughly, paying special attention to the area where your surgery will be performed.  7. Thoroughly rinse your body with warm water from the neck down.   8. DO NOT shower/wash with your normal  soap after using and rinsing off the CHG soap.  9. Pat yourself dry with a clean towel. 10. Wear clean pajamas.  11. Place clean sheets on your bed the night of your first shower and do not sleep with pets.  Day of Surgery: Do not apply any deodorants/lotions.  Please wear clean clothes to the hospital/surgery center.    Thurmon Fair, MD  10/15/2022 12:55 PM    [facility]

## 2022-10-15 NOTE — Patient Instructions (Addendum)
Implantable Device Instructions    Denise Pugh  10/15/2022  You are scheduled for a PPM generator change on Thursday, July 25 with Dr. Rachelle Hora Croitoru.  1. Pre procedure Lab testing:  Get lab work at hospital pre-procedure  2. Please arrive at the [facility] (Main Entrance A) at [facility]: [address] [place], [place] [address] at 1:30 PM (This time is 2 hour(s) before your procedure to ensure your preparation). Free valet parking service is available. You will check in at ADMITTING. The support person will be asked to wait in the waiting room.  It is OK to have someone drop you off and come back when you are ready to be discharged.          Special note: Every effort is made to have your procedure done on time. Please understand that emergencies sometimes delay  scheduled procedures.  3.  No eating or drinking after midnight prior to procedure.     4.  Medication instructions:  On the morning of your procedure     !!IF ANY NEW MEDICATIONS ARE STARTED AFTER TODAY, PLEASE NOTIFY YOUR PROVIDER AS SOON AS POSSIBLE!!  FYI: Medications such as Semaglutide (Ozempic, [place]), Tirzepatide (Mounjaro, Zepbound), Dulaglutide (Trulicity), etc ("GLP1 agonists") AND Canagliflozin (Invokana), Dapagliflozin (Farxiga), Empagliflozin (Jardiance), Ertugliflozin (Steglatro), Bexagliflozin [employer]) or any combination with one of these drugs such as Invokamet (Canagliflozin/Metformin), Synjardy (Empagliflozin/Metformin), etc ("SGLT2 inhibitors") must be held around the time of a procedure. This is not a comprehensive list of all of these drugs. Please review all of your medications and talk to your provider if you take any one of these. If you are not sure, ask your provider.   5.  The night before your procedure and the morning of your procedure scrub your neck/chest with CHG surgical scrub.  See instruction letter.  6. Plan to go home the same day, you will only stay overnight if medically  necessary. 7. You MUST have a responsible adult to drive you home. 8. An adult MUST be with you the first 24 hours after you arrive home. 9. Bring a current list of your medications, and the last time and date medication taken. 10. Bring ID and current insurance cards. 11. Please wear clothes that are easy to get on and off and wear slip-on shoes.    You will follow up with the [facility] Device clinic 10-14 days after your procedure.  You will follow up with Dr. Rachelle Hora Croitoru 91 days after your procedure.  These appointments will be made for you.   * If you have ANY questions after you get home, please call the office at [phone] or send a MyChart message.  FYI: For your safety, and to allow [place] to monitor your vital signs accurately during the surgery/procedure we request that if you have artificial nails, gel coating, SNS etc. Please have those removed prior to your surgery/procedure. Not having the nail coverings /polish removed may result in cancellation or delay of your surgery/procedure.    [facility] - Preparing For Surgery    Before surgery, you can play an important role. Because skin is not sterile, your skin needs to be as free of germs as possible. You can reduce the number of germs on your skin by washing with CHG (chlorahexidine gluconate) Soap before surgery.  CHG is an antiseptic cleaner which kills germs and bonds with the skin to continue killing germs even after washing.  Please do not use  if you have an allergy to CHG or antibacterial soaps.  If your skin becomes reddened/irritated stop using the CHG.   Do not shave (including legs and underarms) for at least 48 hours prior to first CHG shower.  It is OK to shave your face.  Please follow these instructions carefully: 1. Shower the night before surgery and the morning of surgery with CHG.  2. If you choose to wash your hair, wash your hair first as usual with your normal shampoo.  3. After you shampoo, rinse your  hair and body thoroughly to remove the shampoo.  4. Use CHG as you would any other liquid soap.  You can apply CHG directly to the skin and wash gently with a clean washcloth.  5. Apply the CHG Soap to your body ONLY FROM THE NECK DOWN.  Do not use on open wounds or open sores.  Avoid contact with your eyes, ears, mouth and genitals (private parts).  Wash genitals (private parts) with your normal soap.  6. Wash thoroughly, paying special attention to the area where your surgery will be performed.  7. Thoroughly rinse your body with warm water from the neck down.   8. DO NOT shower/wash with your normal soap after using and rinsing off the CHG soap.  9. Pat yourself dry with a clean towel. 10. Wear clean pajamas.  11. Place clean sheets on your bed the night of your first shower and do not sleep with pets.  Day of Surgery: Do not apply any deodorants/lotions.  Please wear clean clothes to the hospital/surgery center.

## 2022-10-15 NOTE — Discharge Instructions (Signed)

## 2022-10-15 NOTE — Progress Notes (Signed)
Cardiology office note   Date:  10/15/2022   ID:  Denise Pugh, DOB [birth year], MRN [id number]  PCP:  Benita Stabile, MD  Cardiologist:  Nicki Guadalajara, MD  Electrophysiologist:  None   Chief Complaint: Pacemaker follow-up  History of Present Illness:    Denise Pugh is a 89 y.o. female with a history of tachycardia-bradycardia syndrome for which she received a dual-chamber permanent pacemaker, subsequently reprogrammed VVIR due to persistent atrial fibrillation (over 2 years now).  Additional medical problems include systemic hypertension, hyperlipidemia, obstructive sleep apnea and apical variant hypertrophic cardiomyopathy (which has not been associated with heart failure or ventricular arrhythmia).  She presents today for her first appointment since January 2023.  Several other appointments were canceled or rescheduled.  She has not performed her pacemaker downloads since 03/24/2022.  Interrogation of her device shows that it reached end of service.  Directly reached replacement trigger on 04/11/2022.  Both leads are Medtronic [id number] leads.  The generator is Medtronic advisor device implanted in 2015.  Battery voltage is 2.76 V (RRT 2.83 V).  Thankfully the device is still providing appropriate pacing.  She no longer has rate response on.  The device is programmed VVI at 65 bpm.  She does require 90% ventricular pacing even at this rate.  Thankfully she is not device dependent, with an underlying rhythm of atrial fibrillation with slow ventricular response in the 50 bpm range.  Did not check lead parameters manually today to avoid further battery depletion, but most recent capture threshold was 0.75 V at 0.4 ms.  Sensing remains excellent as does the lead impedance.  There have been no episodes of high ventricular rate.  She continues to live independently, but her daughter points out that she is becoming much more forgetful.  Overall she is doing quite well without complaints of shortness of  breath, chest pain, leg edema, palpitations or syncope.  She did fall when she jumped out of bed once to answer the phone.  She simply lost her balance and fell backwards.  She had a metacarpal phalangeal fracture.  She did not have head injury.  She has not had any serious bleeding issues.  She remains compliant with Eliquis.    Past Medical History:  Diagnosis Date   Arrhythmia    HX of SVT. CARDIONET MONITOR 07/15/12 TO 08/13/12   Arthritis    Family history of adverse reaction to anesthesia 1970s   mother   GERD (gastroesophageal reflux disease)    Hyperlipidemia 08/06/11   lexiscan myoview-normal. Due to severe attenuation the study specificity and sensitivty are deminished.   Hypertension 08/25/10   ECHO-EF 60-65%   Hypothyroidism    Irregular heart beat    Shortness of breath    Sleep apnea    SLEEP STUDY-[facility]   Thyroid disease    Past Surgical History:  Procedure Laterality Date   CARDIAC CATHETERIZATION  07/06/01   spade-like configuration   CARDIOVERSION N/A 06/07/2017   Procedure: CARDIOVERSION;  Surgeon: Thurmon Fair, MD;  Location: [facility];  Service: Cardiovascular;  Laterality: N/A;   CARDIOVERSION N/A 04/14/2018   Procedure: CARDIOVERSION;  Surgeon: Thurmon Fair, MD;  Location: [facility];  Service: Cardiovascular;  Laterality: N/A;   DILATION AND CURETTAGE OF UTERUS     EYE SURGERY     KNEE ARTHROSCOPY     PERMANENT PACEMAKER INSERTION N/A 07/20/2013   Procedure: PERMANENT PACEMAKER INSERTION;  Surgeon: Thurmon Fair, MD;  Location: [facility];  Service: Cardiovascular;  Laterality: N/A;     Current Meds  Medication Sig   amiodarone (PACERONE) 200 MG tablet    apixaban (ELIQUIS) 5 MG TABS tablet Take 1 tablet (5 mg total) by mouth 2 (two) times daily.   atorvastatin (LIPITOR) 20 MG tablet TAKE 1/2 TABLET EVERY DAY   buPROPion (WELLBUTRIN XL) 150 MG 24 hr tablet 150 mg daily.   Cholecalciferol (VITAMIN D) 2000 UNITS tablet  Take 2,000 Units by mouth daily.   diclofenac Sodium (VOLTAREN) 1 % GEL Apply topically as needed. 1% gel as needed for pain   digoxin (LANOXIN) 0.125 MG tablet TAKE 1 TABLET EVERY DAY   escitalopram (LEXAPRO) 20 MG tablet TAKE 1 TABLET BY MOUTH  DAILY   esomeprazole (NEXIUM) 40 MG capsule Take 40 mg by mouth daily.   hydrochlorothiazide (MICROZIDE) 12.5 MG capsule TAKE 1 CAPSULE EVERY DAY   levothyroxine (SYNTHROID) 88 MCG tablet Take 88 mcg by mouth daily.   Liniments (DEEP BLUE RELIEF EX) Apply 1 application  topically daily as needed (for back, shoulder and knee pain).   metoprolol tartrate (LOPRESSOR) 100 MG tablet TAKE (1) TABLET BY MOUTH TWICE DAILY.   Multiple Vitamins-Minerals (PRESERVISION AREDS 2 PO) Take 2 tablets by mouth daily.   oxybutynin (DITROPAN) 5 MG tablet Take 5 mg by mouth 2 (two) times daily.    traMADol (ULTRAM) 50 MG tablet Take 100 mg by mouth 2 (two) times daily.      Allergies:   Penicillins and Tetanus toxoid   Social History   Tobacco Use   Smoking status: Never   Smokeless tobacco: Never  Vaping Use   Vaping status: Never Used  Substance Use Topics   Alcohol use: No    Alcohol/week: 0.0 standard drinks of alcohol   Drug use: No     Family Hx: The patient's family history includes Diabetes in her father and mother; Heart attack in her maternal grandfather and maternal grandmother; Hypertension in her father.  ROS:   Please see the history of present illness.   All other systems are reviewed and are negative.   Prior CV studies:   The following studies were reviewed today:  Comprehensive pacemaker download performed in the office today  Labs/Other Tests and Data Reviewed:    EKG: Was not performed today.  Intracardiac electrogram shows atrial sensed (atrial fibrillation), ventricular paced rhythm.  Recent Labs: No results found for requested labs within last 365 days.   08/07/2020 TSH 4.10, creatinine 0.8, hemoglobin 14.8, hemoglobin A1c  5.9% 08/07/2020 Cholesterol 112, HDL 42, LDL 51  Recent Lipid Panel Lab Results  Component Value Date/Time   CHOL 120 07/19/2013 05:39 AM   TRIG 52 07/19/2013 05:39 AM   HDL 45 07/19/2013 05:39 AM   CHOLHDL 2.7 07/19/2013 05:39 AM   LDLCALC 65 07/19/2013 05:39 AM   08/07/2020  cholesterol 112, HDL 42, LDL 51, triglycerides 101  Wt Readings from Last 3 Encounters:  10/15/22 152 lb 12.8 oz (69.3 kg)  04/07/21 159 lb (72.1 kg)  01/21/21 160 lb (72.6 kg)     Objective:    Vital Signs:  BP 128/64 (BP Location: Left Arm, Patient Position: Sitting, Cuff Size: Normal)   Pulse 65   Ht 5\' 3"  (1.6 m)   Wt 152 lb 12.8 oz (69.3 kg)   SpO2 90%   BMI 27.07 kg/m      General: Alert, oriented x3, no distress, healthy left subclavian pacemaker site Head: no evidence of trauma, PERRL, EOMI, no  exophtalmos or lid lag, no myxedema, no xanthelasma; normal ears, nose and oropharynx Neck: normal jugular venous pulsations and no hepatojugular reflux; brisk carotid pulses without delay and no carotid bruits Chest: clear to auscultation, no signs of consolidation by percussion or palpation, normal fremitus, symmetrical and full respiratory excursions Cardiovascular: normal position and quality of the apical impulse, regular rhythm, normal first and second heart sounds, no murmurs, rubs or gallops Abdomen: no tenderness or distention, no masses by palpation, no abnormal pulsatility or arterial bruits, normal bowel sounds, no hepatosplenomegaly Extremities: no clubbing, cyanosis or edema; 2+ radial, ulnar and brachial pulses bilaterally; 2+ right femoral, posterior tibial and dorsalis pedis pulses; 2+ left femoral, posterior tibial and dorsalis pedis pulses; no subclavian or femoral bruits Neurological: grossly nonfocal Psych: Normal mood and affect     ASSESSMENT & PLAN:    1. Permanent atrial fibrillation (HCC)   2. Essential hypertension   3. Symptomatic sinus bradycardia   4. Pacemaker  battery depletion   5. Acquired thrombophilia (HCC)        AFib: Permanent arrhythmia with controlled ventricular response, appropriately anticoagulated.  CHA2DS2-VASc 4-6 (age 24, gender, HTN +/-cardiomyopathy, +/-prediabetes).  In the past with a difficult time with ventricular rate control and with broad oscillations between RVR and excessive pacing.  We had to use digoxin in addition to beta-blockers.  I think we will stop the digoxin currently since she is pacing 90% of the time. PPM: The device reached RRT about 6 months ago.  Thankfully she is not pacemaker dependent and the device is still providing adequate pacing.  Discussed generator change out and we will schedule that for later today. Apical HCM: She has not had clinical evidence of heart failure or ventricular arrhythmia.  No evidence of LV outflow tract obstruction by echo.  The cardiomyopathy may be associated with diastolic dysfunction. Anticoagulation: She had only 1 fall in the last several months, thankfully without serious bleeding complications and without head injury.  Continue anticoagulation at least for the time being. HTN: Well-controlled HLP: Excellent parameters earlier this year.  This procedure has been fully reviewed with the patient and written informed consent has been obtained.   Patient Instructions     Implantable Device Instructions    LESHIA KOPE  10/15/2022  You are scheduled for a PPM generator change on Thursday, July 25 with Dr. Rachelle Hora Nikki Rusnak.  1. Pre procedure Lab testing:  Get lab work at hospital pre-procedure  2. Please arrive at the [facility] (Main Entrance A) at [facility]: [address] [place], [place] [address] at 1:30 PM (This time is 2 hour(s) before your procedure to ensure your preparation). Free valet parking service is available. You will check in at ADMITTING. The support person will be asked to wait in the waiting room.  It is OK to have someone drop you off and  come back when you are ready to be discharged.          Special note: Every effort is made to have your procedure done on time. Please understand that emergencies sometimes delay  scheduled procedures.  3.  No eating or drinking after midnight prior to procedure.     4.  Medication instructions:  On the morning of your procedure     !!IF ANY NEW MEDICATIONS ARE STARTED AFTER TODAY, PLEASE NOTIFY YOUR PROVIDER AS SOON AS POSSIBLE!!  FYI: Medications such as Semaglutide (Ozempic, [place]), Tirzepatide (Mounjaro, Zepbound), Dulaglutide (Trulicity), etc ("GLP1 agonists") AND Canagliflozin (Invokana),  Dapagliflozin Marcelline Deist), Empagliflozin (Jardiance), Ertugliflozin (Steglatro), Bexagliflozin Arboriculturist) or any combination with one of these drugs such as Invokamet (Canagliflozin/Metformin), Synjardy (Empagliflozin/Metformin), etc ("SGLT2 inhibitors") must be held around the time of a procedure. This is not a comprehensive list of all of these drugs. Please review all of your medications and talk to your provider if you take any one of these. If you are not sure, ask your provider.   5.  The night before your procedure and the morning of your procedure scrub your neck/chest with CHG surgical scrub.  See instruction letter.  6. Plan to go home the same day, you will only stay overnight if medically necessary. 7. You MUST have a responsible adult to drive you home. 8. An adult MUST be with you the first 24 hours after you arrive home. 9. Bring a current list of your medications, and the last time and date medication taken. 10. Bring ID and current insurance cards. 11. Please wear clothes that are easy to get on and off and wear slip-on shoes.    You will follow up with the [facility] Device clinic 10-14 days after your procedure.  You will follow up with Dr. Rachelle Hora Xiao Graul 91 days after your procedure.  These appointments will be made for you.   * If you have ANY questions after you get home, please  call the office at [phone] or send a MyChart message.  FYI: For your safety, and to allow [place] to monitor your vital signs accurately during the surgery/procedure we request that if you have artificial nails, gel coating, SNS etc. Please have those removed prior to your surgery/procedure. Not having the nail coverings /polish removed may result in cancellation or delay of your surgery/procedure.     - Preparing For Surgery    Before surgery, you can play an important role. Because skin is not sterile, your skin needs to be as free of germs as possible. You can reduce the number of germs on your skin by washing with CHG (chlorahexidine gluconate) Soap before surgery.  CHG is an antiseptic cleaner which kills germs and bonds with the skin to continue killing germs even after washing.  Please do not use if you have an allergy to CHG or antibacterial soaps.  If your skin becomes reddened/irritated stop using the CHG.   Do not shave (including legs and underarms) for at least 48 hours prior to first CHG shower.  It is OK to shave your face.  Please follow these instructions carefully: 1. Shower the night before surgery and the morning of surgery with CHG.  2. If you choose to wash your hair, wash your hair first as usual with your normal shampoo.  3. After you shampoo, rinse your hair and body thoroughly to remove the shampoo.  4. Use CHG as you would any other liquid soap.  You can apply CHG directly to the skin and wash gently with a clean washcloth.  5. Apply the CHG Soap to your body ONLY FROM THE NECK DOWN.  Do not use on open wounds or open sores.  Avoid contact with your eyes, ears, mouth and genitals (private parts).  Wash genitals (private parts) with your normal soap.  6. Wash thoroughly, paying special attention to the area where your surgery will be performed.  7. Thoroughly rinse your body with warm water from the neck down.   8. DO NOT shower/wash with your normal  soap after using and rinsing off the CHG soap.  9. Pat yourself dry with a clean towel. 10. Wear clean pajamas.  11. Place clean sheets on your bed the night of your first shower and do not sleep with pets.  Day of Surgery: Do not apply any deodorants/lotions.  Please wear clean clothes to the hospital/surgery center.    Thurmon Fair, MD  10/15/2022 12:55 PM    [facility]

## 2022-10-15 NOTE — Op Note (Signed)
Procedure report  Procedure performed:  Dual chamber permanent pacemaker generator changeout  Aigis pouch  Reason for procedure:  1. Device generator at elective replacement interval  Procedure performed by:  Thurmon Fair, MD  Complications:  None  Estimated blood loss:  <5 mL  Medications administered during procedure:  Vancomycin 1 g intravenously, lidocaine 1% 30 mL locally,Device details:   New Generator Medtronic Azure XT DR model number M5895571, serial number [id number] Right atrial lead (chronic) Medtronic, model number [id number], serial number [id number] (implanted 07/20/2013) Right ventricular lead (chronic)  Medtronic, model number [id number], serial number [id number] (implanted 07/20/2013)  Explanted generator Medtronic ,  model number  [id number], serial number  [id number] (implanted  07/20/2013)  Procedure details:  After the risks and benefits of the procedure were discussed the patient provided informed consent. She was brought to the cardiac catheter lab in the fasting state. The patient was prepped and draped in usual sterile fashion. Local anesthesia with 1% lidocaine was administered to to the left infraclavicular area. A 5-6cm horizontal incision was made parallel with and 2-3 cm caudal to the left clavicle, in the area of an old scar.  Using minimal electrocautery and mostly sharp and blunt dissection the prepectoral pocket was opened carefully to avoid injury to the loops of chronic leads. Extensive dissection was not necessary. The device was explanted. The pocket was carefully inspected for hemostasis and flushed with copious amounts of antibiotic solution.  Despite longstanding persistent atrial fibrillation, a dual chamber device was chosen to preserve MRI conditional status.  The leads were disconnected from the old generator and the new generator was connected to the chronic leads, with appropriate pacing noted. Telemetry testing of the lead parameters showed unchanged  values. The device was placed in an antibiotic pouch (Aigis).  The entire system was then carefully inserted in the pocket with care been taking that the leads and device and the pouch assumed a comfortable position without pressure on the incision. Great care was taken that the leads be located deep to the generator. The pocket was then closed in layers using 2 layers of 2-0 Vicryl and 3-0 Vicryl and cutaneous steristrips after which a sterile dressing was applied.   At the end of the procedure the following lead parameters were encountered:   Right atrial lead sensed P waves 0.6 mV, impedance 437 ohms, threshold not tested due to AFib   Right ventricular lead sensed R waves  18.3 mV, impedance 532 ohms, threshold 1.0 at 0.4 ms pulse width.  Thurmon Fair, MD, [facility] [phone] office [phone] pager

## 2022-10-16 ENCOUNTER — Encounter (HOSPITAL_COMMUNITY): Payer: Self-pay | Admitting: Cardiovascular Disease

## 2022-10-16 MED FILL — Vancomycin HCl IV Soln 1000 MG/200ML (Base Equivalent): INTRAVENOUS | Qty: 200 | Status: AC

## 2022-10-22 ENCOUNTER — Encounter: Payer: Medicare HMO | Admitting: Cardiovascular Disease

## 2022-10-27 ENCOUNTER — Encounter: Payer: Self-pay | Admitting: Cardiovascular Disease

## 2022-10-29 ENCOUNTER — Ambulatory Visit: Payer: Medicare HMO

## 2022-11-02 ENCOUNTER — Other Ambulatory Visit: Payer: Self-pay | Admitting: Cardiovascular Disease

## 2022-11-03 ENCOUNTER — Ambulatory Visit: Payer: Medicare HMO | Attending: Interventional Cardiology

## 2022-11-03 DIAGNOSIS — I4891 Unspecified atrial fibrillation: Secondary | ICD-10-CM | POA: Diagnosis not present

## 2022-11-03 NOTE — Patient Instructions (Addendum)

## 2022-11-04 ENCOUNTER — Telehealth: Payer: Self-pay | Admitting: Cardiovascular Disease

## 2022-11-04 NOTE — Telephone Encounter (Signed)
Pt is requesting a callback regarding her wound check office visit yesterday. She stated it's not looking any better and she'd very concerned about it since its swelling and darker around the area that was cut. Please advise

## 2022-11-04 NOTE — Progress Notes (Signed)
Wound check appointment. Steri-strips removed. Wound without redness or edema. Some healing bruising at incision site. Image uploaded into epic note. Incision edges approximated, wound well healed. Normal device function. Thresholds, sensing, and impedances consistent with implant measurements. Device programmed at 2v@0 .4ms and auto capture programmed on . Histogram distribution appropriate for patient and level of activity. ATAF burden 100% + OAC/known per epic. No ventricular rates noted. Patient educated about wound care, arm mobility, lifting restrictions. ROV in 3 months with implanting physician.

## 2022-11-04 NOTE — Telephone Encounter (Signed)
Returned phone call. Pt tearful and anxious concerning pacer wound site. Telephonic assessment seems to be unchanged from yesterday. Pt inconsolable and not amenable to repeat RN check. Insisting she sees Dr. Royann Shivers. "He put it in me; if he sees it and he's happy with it ill be content." Offered appointment w/ Dr Royann Shivers 8/15 @ 0820. Unable to make it to that appointment. Sent epic chat to Dr. Salena Saner.

## 2022-11-04 NOTE — Telephone Encounter (Signed)
Dr. Royann Shivers contacted patient and re-assured her that site looked appropriate.

## 2022-11-04 NOTE — Telephone Encounter (Signed)
Returned pt call in regards to her wound check.

## 2022-11-30 ENCOUNTER — Other Ambulatory Visit: Payer: Self-pay | Admitting: Cardiovascular Disease

## 2022-11-30 DIAGNOSIS — I480 Paroxysmal atrial fibrillation: Secondary | ICD-10-CM

## 2022-11-30 NOTE — Telephone Encounter (Signed)
Prescription refill request for Eliquis received. Indication:afib Last office visit:7/24 Scr:0.92  7/24 Age: 89 Weight:71.7  kg  Prescription refilled

## 2022-12-01 ENCOUNTER — Other Ambulatory Visit: Payer: Self-pay | Admitting: Cardiovascular Disease

## 2022-12-02 ENCOUNTER — Encounter: Payer: Self-pay | Admitting: Cardiovascular Disease

## 2022-12-02 NOTE — Telephone Encounter (Signed)
Error

## 2022-12-11 DIAGNOSIS — I482 Chronic atrial fibrillation, unspecified: Secondary | ICD-10-CM | POA: Diagnosis not present

## 2022-12-11 DIAGNOSIS — K219 Gastro-esophageal reflux disease without esophagitis: Secondary | ICD-10-CM | POA: Diagnosis not present

## 2022-12-11 DIAGNOSIS — R7303 Prediabetes: Secondary | ICD-10-CM | POA: Diagnosis not present

## 2022-12-11 DIAGNOSIS — E039 Hypothyroidism, unspecified: Secondary | ICD-10-CM | POA: Diagnosis not present

## 2022-12-11 DIAGNOSIS — F332 Major depressive disorder, recurrent severe without psychotic features: Secondary | ICD-10-CM | POA: Diagnosis not present

## 2022-12-11 DIAGNOSIS — G4733 Obstructive sleep apnea (adult) (pediatric): Secondary | ICD-10-CM | POA: Diagnosis not present

## 2022-12-11 DIAGNOSIS — H3530 Unspecified macular degeneration: Secondary | ICD-10-CM | POA: Diagnosis not present

## 2022-12-11 DIAGNOSIS — E782 Mixed hyperlipidemia: Secondary | ICD-10-CM | POA: Diagnosis not present

## 2022-12-11 DIAGNOSIS — Z79899 Other long term (current) drug therapy: Secondary | ICD-10-CM | POA: Diagnosis not present

## 2022-12-11 DIAGNOSIS — Z7901 Long term (current) use of anticoagulants: Secondary | ICD-10-CM | POA: Diagnosis not present

## 2022-12-11 DIAGNOSIS — E559 Vitamin D deficiency, unspecified: Secondary | ICD-10-CM | POA: Diagnosis not present

## 2022-12-11 DIAGNOSIS — I10 Essential (primary) hypertension: Secondary | ICD-10-CM | POA: Diagnosis not present

## 2023-01-14 ENCOUNTER — Telehealth: Payer: Self-pay | Admitting: Cardiovascular Disease

## 2023-01-14 NOTE — Telephone Encounter (Signed)
Pt states that it feels like her pacemaker has turned over on her and sticks out. Please advise

## 2023-01-15 ENCOUNTER — Telehealth: Payer: Self-pay

## 2023-01-15 NOTE — Telephone Encounter (Signed)
Erroneous encounter

## 2023-01-15 NOTE — Telephone Encounter (Signed)
She can touch it and move it and it sticks up on the left side more- she can almost stick her thumb up under it- it is very moveable. She can feel every bit of it (with her hand- when she touches it) It does not move on its own. It has been like this since she got it. No pain or discomfort, no redness or swelling. She is not experiencing any symptoms. She just did not know if this was normal, her last pacemaker was not like this.  Told her that I will send this information to her provider and call with any recommendations. Instructed her not to "mess with it to much" she reports that she does not, its just when she changes her clothes and stuff. She showed it to her friend and her friend said she should get it checked out.

## 2023-01-15 NOTE — Telephone Encounter (Addendum)
Pt reports she does not touch it but just in brushing by it. Manual transmission received. RV threshold 1--4 which is up from .75--4. Some fluctuation in recent lead trend graphs but overall stable compared to long term trends. Next transmission scheduled 10/29. Stressed the importance of not manipulating the device as it would ultimately manipulate the leads. RV pacing 33% MVP off.

## 2023-01-18 NOTE — Telephone Encounter (Signed)
Left message: Dr Royann Shivers replied: Pacemaker is working great.  What she is describing does not sound like a problem.  The pacemaker is likely more mobile, because she has lost weight in the meantime.  Left call back number in case she has any questions.

## 2023-01-18 NOTE — Telephone Encounter (Signed)
Pacemaker is working great.  What she is describing does not sound like a problem.  The pacemaker is likely more mobile, because she has lost weight in the meantime.

## 2023-01-19 ENCOUNTER — Ambulatory Visit (INDEPENDENT_AMBULATORY_CARE_PROVIDER_SITE_OTHER): Payer: Medicare HMO

## 2023-01-19 DIAGNOSIS — I495 Sick sinus syndrome: Secondary | ICD-10-CM | POA: Diagnosis not present

## 2023-01-20 LAB — CUP PACEART REMOTE DEVICE CHECK
Battery Remaining Longevity: 164 mo
Battery Voltage: 3.19 V
Brady Statistic RA Percent Paced: 0 %
Brady Statistic RV Percent Paced: 47.92 %
Date Time Interrogation Session: 20241029012350
Implantable Lead Connection Status: 753985
Implantable Lead Connection Status: 753985
Implantable Lead Implant Date: 20150430
Implantable Lead Implant Date: 20150430
Implantable Lead Location: 753859
Implantable Lead Location: 753860
Implantable Lead Model: 5076
Implantable Lead Model: 5076
Implantable Pulse Generator Implant Date: 20240725
Lead Channel Impedance Value: 361 Ohm
Lead Channel Impedance Value: 437 Ohm
Lead Channel Impedance Value: 456 Ohm
Lead Channel Impedance Value: 532 Ohm
Lead Channel Pacing Threshold Amplitude: 1 V
Lead Channel Pacing Threshold Pulse Width: 0.4 ms
Lead Channel Sensing Intrinsic Amplitude: 0.625 mV
Lead Channel Sensing Intrinsic Amplitude: 0.625 mV
Lead Channel Sensing Intrinsic Amplitude: 16.625 mV
Lead Channel Sensing Intrinsic Amplitude: 16.625 mV
Lead Channel Setting Pacing Amplitude: 2.25 V
Lead Channel Setting Pacing Pulse Width: 0.4 ms
Lead Channel Setting Sensing Sensitivity: 4 mV
Zone Setting Status: 755011
Zone Setting Status: 755011

## 2023-02-09 NOTE — Progress Notes (Signed)
Remote pacemaker transmission.   

## 2023-02-11 ENCOUNTER — Ambulatory Visit: Payer: Medicare HMO | Admitting: Cardiovascular Disease

## 2023-03-02 ENCOUNTER — Other Ambulatory Visit: Payer: Self-pay | Admitting: Cardiovascular Disease

## 2023-04-20 ENCOUNTER — Ambulatory Visit (INDEPENDENT_AMBULATORY_CARE_PROVIDER_SITE_OTHER): Payer: Medicare HMO

## 2023-04-20 DIAGNOSIS — I495 Sick sinus syndrome: Secondary | ICD-10-CM

## 2023-04-20 LAB — CUP PACEART REMOTE DEVICE CHECK
Battery Remaining Longevity: 169 mo
Battery Voltage: 3.16 V
Brady Statistic RA Percent Paced: 0 %
Brady Statistic RV Percent Paced: 24.05 %
Date Time Interrogation Session: 20250128051902
Implantable Lead Connection Status: 753985
Implantable Lead Connection Status: 753985
Implantable Lead Implant Date: 20150430
Implantable Lead Implant Date: 20150430
Implantable Lead Location: 753859
Implantable Lead Location: 753860
Implantable Lead Model: 5076
Implantable Lead Model: 5076
Implantable Pulse Generator Implant Date: 20240725
Lead Channel Impedance Value: 361 Ohm
Lead Channel Impedance Value: 418 Ohm
Lead Channel Impedance Value: 456 Ohm
Lead Channel Impedance Value: 513 Ohm
Lead Channel Pacing Threshold Amplitude: 1 V
Lead Channel Pacing Threshold Pulse Width: 0.4 ms
Lead Channel Sensing Intrinsic Amplitude: 0.625 mV
Lead Channel Sensing Intrinsic Amplitude: 0.625 mV
Lead Channel Sensing Intrinsic Amplitude: 12.375 mV
Lead Channel Sensing Intrinsic Amplitude: 12.375 mV
Lead Channel Setting Pacing Amplitude: 2 V
Lead Channel Setting Pacing Pulse Width: 0.4 ms
Lead Channel Setting Sensing Sensitivity: 4 mV
Zone Setting Status: 755011
Zone Setting Status: 755011

## 2023-04-24 ENCOUNTER — Encounter: Payer: Self-pay | Admitting: Cardiovascular Disease

## 2023-05-19 ENCOUNTER — Other Ambulatory Visit: Payer: Self-pay | Admitting: Cardiovascular Disease

## 2023-05-19 DIAGNOSIS — I480 Paroxysmal atrial fibrillation: Secondary | ICD-10-CM

## 2023-05-19 NOTE — Telephone Encounter (Addendum)
 Eliquis 5mg  refill request received. Patient is 89 years old, weight-71.7kg, Crea-0.92 on 10/15/22, Diagnosis-Afib, and last seen by Dr. Royann Shivers on 10/15/22. Dose is appropriate based on dosing criteria. Will send in refill to requested pharmacy.

## 2023-05-31 NOTE — Progress Notes (Signed)
 Remote pacemaker transmission.

## 2023-06-01 ENCOUNTER — Other Ambulatory Visit: Payer: Self-pay | Admitting: Cardiovascular Disease

## 2023-07-20 ENCOUNTER — Ambulatory Visit (INDEPENDENT_AMBULATORY_CARE_PROVIDER_SITE_OTHER): Payer: Medicare HMO

## 2023-07-20 DIAGNOSIS — I495 Sick sinus syndrome: Secondary | ICD-10-CM | POA: Diagnosis not present

## 2023-07-20 LAB — CUP PACEART REMOTE DEVICE CHECK
Battery Remaining Longevity: 168 mo
Battery Voltage: 3.11 V
Brady Statistic RA Percent Paced: 0 %
Brady Statistic RV Percent Paced: 16.77 %
Date Time Interrogation Session: 20250429001038
Implantable Lead Connection Status: 753985
Implantable Lead Connection Status: 753985
Implantable Lead Implant Date: 20150430
Implantable Lead Implant Date: 20150430
Implantable Lead Location: 753859
Implantable Lead Location: 753860
Implantable Lead Model: 5076
Implantable Lead Model: 5076
Implantable Pulse Generator Implant Date: 20240725
Lead Channel Impedance Value: 361 Ohm
Lead Channel Impedance Value: 456 Ohm
Lead Channel Impedance Value: 456 Ohm
Lead Channel Impedance Value: 532 Ohm
Lead Channel Pacing Threshold Amplitude: 0.75 V
Lead Channel Pacing Threshold Pulse Width: 0.4 ms
Lead Channel Sensing Intrinsic Amplitude: 1 mV
Lead Channel Sensing Intrinsic Amplitude: 1 mV
Lead Channel Sensing Intrinsic Amplitude: 10.5 mV
Lead Channel Sensing Intrinsic Amplitude: 10.5 mV
Lead Channel Setting Pacing Amplitude: 2 V
Lead Channel Setting Pacing Pulse Width: 0.4 ms
Lead Channel Setting Sensing Sensitivity: 4 mV
Zone Setting Status: 755011
Zone Setting Status: 755011

## 2023-07-24 ENCOUNTER — Encounter: Payer: Self-pay | Admitting: Cardiovascular Disease

## 2023-07-26 ENCOUNTER — Telehealth: Payer: Self-pay | Admitting: Emergency Medicine

## 2023-07-26 MED ORDER — METOPROLOL TARTRATE 100 MG PO TABS: 150.0000 mg | ORAL_TABLET | Freq: Two times a day (BID) | ORAL | 3 refills | Status: DC

## 2023-07-26 NOTE — Telephone Encounter (Signed)
Pt returning nurse call. Please advise.

## 2023-07-26 NOTE — Telephone Encounter (Signed)
 Sorry.  Maybe I was looking at an old list.  Please  increase the metoprolol  tartrate to 150 mg twice daily pleat.

## 2023-07-26 NOTE — Telephone Encounter (Signed)
 Left message stating that she needs to increase Metoprolol  Tartrate to 150 mg twice a day, will send in a prescription.   RX sent to [facility].   The patient still needs a f/u appt in July scheduled

## 2023-07-26 NOTE — Telephone Encounter (Signed)
 The heart rate is still too fast despite the current doses of metoprolol  and digoxin . Please increase the metoprolol  succinate to 150 mg daily. Will have to see if your blood pressure allows a higher dose of metoprolol . If you start feeling weaker or dizzy or the BP is under 100/60, please let me know. Plan to continue remote downloads every 3 months.  We are due an office visit in July.   Left the information above over voicemail. Left call back number.

## 2023-07-27 NOTE — Telephone Encounter (Signed)
 Spoke with the patient- She verbalized understanding of medication directions- she spoke with another nurse today.  Made f/u appointment 10/21/23 at 11:00- pacer check. Gave address of new location and floor. She verbalized understanding of all information.

## 2023-08-25 ENCOUNTER — Telehealth: Payer: Self-pay | Admitting: Cardiovascular Disease

## 2023-08-25 NOTE — Telephone Encounter (Signed)
 Returned pt's call- gave her the information about the upcoming appt and new address. She verbalized understanding of all information

## 2023-08-25 NOTE — Telephone Encounter (Signed)
 Patient would like a call back from Dr. Leola Raisin nurse regarding 7/31 appointment. She declined going into detail with me.

## 2023-08-30 ENCOUNTER — Other Ambulatory Visit: Payer: Self-pay | Admitting: Cardiovascular Disease

## 2023-09-03 NOTE — Progress Notes (Signed)
 Remote pacemaker transmission.

## 2023-10-19 ENCOUNTER — Ambulatory Visit (INDEPENDENT_AMBULATORY_CARE_PROVIDER_SITE_OTHER): Payer: Medicare HMO

## 2023-10-19 DIAGNOSIS — I495 Sick sinus syndrome: Secondary | ICD-10-CM

## 2023-10-20 LAB — CUP PACEART REMOTE DEVICE CHECK
Battery Remaining Longevity: 165 mo
Battery Voltage: 3.06 V
Brady Statistic RA Percent Paced: 0 %
Brady Statistic RV Percent Paced: 16.48 %
Date Time Interrogation Session: 20250729023855
Implantable Lead Connection Status: 753985
Implantable Lead Connection Status: 753985
Implantable Lead Implant Date: 20150430
Implantable Lead Implant Date: 20150430
Implantable Lead Location: 753859
Implantable Lead Location: 753860
Implantable Lead Model: 5076
Implantable Lead Model: 5076
Implantable Pulse Generator Implant Date: 20240725
Lead Channel Impedance Value: 380 Ohm
Lead Channel Impedance Value: 437 Ohm
Lead Channel Impedance Value: 475 Ohm
Lead Channel Impedance Value: 532 Ohm
Lead Channel Pacing Threshold Amplitude: 0.875 V
Lead Channel Pacing Threshold Pulse Width: 0.4 ms
Lead Channel Sensing Intrinsic Amplitude: 0.625 mV
Lead Channel Sensing Intrinsic Amplitude: 0.625 mV
Lead Channel Sensing Intrinsic Amplitude: 9 mV
Lead Channel Sensing Intrinsic Amplitude: 9 mV
Lead Channel Setting Pacing Amplitude: 2 V
Lead Channel Setting Pacing Pulse Width: 0.4 ms
Lead Channel Setting Sensing Sensitivity: 4 mV
Zone Setting Status: 755011
Zone Setting Status: 755011

## 2023-10-21 ENCOUNTER — Ambulatory Visit: Attending: Cardiovascular Disease | Admitting: Cardiovascular Disease

## 2023-10-26 ENCOUNTER — Ambulatory Visit: Payer: Self-pay | Admitting: Cardiovascular Disease

## 2023-10-26 NOTE — Telephone Encounter (Signed)
 Left message and call back number- patient missed appt on 7/31 for device check and needs an appointment. Also wanted to go over results.   Latest Remote: Denise Headland, MD     10/26/23 11:36 AM Result Note Remote reviewed.   Presenting rhythm is atrial fibrillation, ventricular sensed. Not pacemaker dependent. Battery status is good. Lead measurements (capture threshold, sensing and impedance) are stable. Heart rate histogram is favorable. Permanent atrial fibrillation with mediocre ventricular rate control.  Average ventricular rate 80-90 bpm, with relatively frequent episodes of RVR.  Ventricular pacing occurs only 16% of the time.   She missed her 10/21/2023 appointment.  Could we please reschedule in the office on a device today?

## 2023-11-16 ENCOUNTER — Other Ambulatory Visit: Payer: Self-pay | Admitting: Cardiovascular Disease

## 2023-11-16 DIAGNOSIS — I480 Paroxysmal atrial fibrillation: Secondary | ICD-10-CM

## 2023-11-16 NOTE — Telephone Encounter (Signed)
 Refill Request.

## 2023-11-16 NOTE — Telephone Encounter (Signed)
 Prescription refill request for Eliquis  received. Indication: AF Last office visit: 10/15/22  Denise Croitoru MD Scr: Age: 89 Weight: 69.3kg   Based on above findings Eliquis  5mg  twice daily is the appropriate dose.  Pt is past due for appt with Dr Francyne.  Message sent to scheduler.  Refill approved x 1.

## 2023-11-19 ENCOUNTER — Telehealth: Payer: Self-pay

## 2023-11-19 DIAGNOSIS — I10 Essential (primary) hypertension: Secondary | ICD-10-CM

## 2023-11-25 ENCOUNTER — Telehealth: Payer: Self-pay

## 2023-11-25 NOTE — Progress Notes (Signed)
 Complex Care Management Note Care Guide Note  11/25/2023 Name: Denise Pugh MRN: [id number] DOB: 10-Apr-1928   Complex Care Management Outreach Attempts: An unsuccessful telephone outreach was attempted today to offer the patient information about available complex care management services.  Follow Up Plan:  No further outreach attempts will be made at this time. We have been unable to contact the patient to offer or enroll patient in complex care management services.  Encounter Outcome:  Patient Refused  Leotis Rase [facility] Guide  Direct Dial: [phone]  Fax [phone]

## 2023-11-30 ENCOUNTER — Telehealth: Payer: Self-pay

## 2023-11-30 MED ORDER — HYDROCHLOROTHIAZIDE 12.5 MG PO CAPS: 12.5000 mg | ORAL_CAPSULE | Freq: Every day | ORAL | 0 refills | Status: DC

## 2023-11-30 NOTE — Telephone Encounter (Signed)
 Pt's medication was sent to pt's pharmacy as requested. Confirmation received.

## 2023-11-30 NOTE — Telephone Encounter (Signed)
*  STAT* If patient is at the pharmacy, call can be transferred to refill team.   1. Which medications need to be refilled? (please list name of each medication and dose if known)   hydrochlorothiazide  (MICROZIDE ) 12.5 MG capsule   2. Would you like to learn more about the convenience, safety, & potential cost savings by using the [facility]?   3. Are you open to using the [facility] (Type [facility]. ).  4. Which pharmacy/location (including street and city if local pharmacy) is medication to be sent to?  [facility] - [place], [place] - [address]   5. Do they need a 30 day or 90 day supply?   30 day  Caller [facility]) stated patient is completely out of this medication.

## 2023-12-22 NOTE — Progress Notes (Signed)
 Remote PPM Transmission

## 2023-12-26 ENCOUNTER — Other Ambulatory Visit: Payer: Self-pay | Admitting: Cardiovascular Disease

## 2023-12-26 DIAGNOSIS — I480 Paroxysmal atrial fibrillation: Secondary | ICD-10-CM

## 2023-12-27 NOTE — Telephone Encounter (Signed)
 Prescription refill request for Eliquis  received. Indication:afib Last office visit:need appt Scr: Age:  Weight:

## 2023-12-28 DIAGNOSIS — E039 Hypothyroidism, unspecified: Secondary | ICD-10-CM | POA: Diagnosis not present

## 2023-12-28 DIAGNOSIS — I10 Essential (primary) hypertension: Secondary | ICD-10-CM | POA: Diagnosis not present

## 2023-12-28 DIAGNOSIS — E559 Vitamin D deficiency, unspecified: Secondary | ICD-10-CM | POA: Diagnosis not present

## 2023-12-28 DIAGNOSIS — F332 Major depressive disorder, recurrent severe without psychotic features: Secondary | ICD-10-CM | POA: Diagnosis not present

## 2023-12-28 DIAGNOSIS — I739 Peripheral vascular disease, unspecified: Secondary | ICD-10-CM | POA: Diagnosis not present

## 2023-12-28 DIAGNOSIS — R7303 Prediabetes: Secondary | ICD-10-CM | POA: Diagnosis not present

## 2023-12-28 DIAGNOSIS — Z23 Encounter for immunization: Secondary | ICD-10-CM | POA: Diagnosis not present

## 2023-12-28 DIAGNOSIS — R32 Unspecified urinary incontinence: Secondary | ICD-10-CM | POA: Diagnosis not present

## 2023-12-28 DIAGNOSIS — K219 Gastro-esophageal reflux disease without esophagitis: Secondary | ICD-10-CM | POA: Diagnosis not present

## 2023-12-28 DIAGNOSIS — G4733 Obstructive sleep apnea (adult) (pediatric): Secondary | ICD-10-CM | POA: Diagnosis not present

## 2023-12-28 DIAGNOSIS — I482 Chronic atrial fibrillation, unspecified: Secondary | ICD-10-CM | POA: Diagnosis not present

## 2023-12-28 DIAGNOSIS — H3530 Unspecified macular degeneration: Secondary | ICD-10-CM | POA: Diagnosis not present

## 2023-12-28 DIAGNOSIS — E782 Mixed hyperlipidemia: Secondary | ICD-10-CM | POA: Diagnosis not present

## 2024-01-09 ENCOUNTER — Other Ambulatory Visit: Payer: Self-pay

## 2024-01-09 ENCOUNTER — Inpatient Hospital Stay (HOSPITAL_COMMUNITY)
Admission: EM | Admit: 2024-01-09 | Discharge: 2024-01-15 | DRG: 308 | Disposition: A | Discharge status: Skilled Nursing Facility | Attending: Internal Medicine | Admitting: Internal Medicine

## 2024-01-09 ENCOUNTER — Emergency Department (HOSPITAL_COMMUNITY)

## 2024-01-09 DIAGNOSIS — Z7901 Long term (current) use of anticoagulants: Secondary | ICD-10-CM

## 2024-01-09 DIAGNOSIS — Z833 Family history of diabetes mellitus: Secondary | ICD-10-CM

## 2024-01-09 DIAGNOSIS — R627 Adult failure to thrive: Secondary | ICD-10-CM | POA: Diagnosis present

## 2024-01-09 DIAGNOSIS — I495 Sick sinus syndrome: Secondary | ICD-10-CM | POA: Diagnosis present

## 2024-01-09 DIAGNOSIS — I480 Paroxysmal atrial fibrillation: Secondary | ICD-10-CM | POA: Diagnosis present

## 2024-01-09 DIAGNOSIS — M1990 Unspecified osteoarthritis, unspecified site: Secondary | ICD-10-CM

## 2024-01-09 DIAGNOSIS — E860 Dehydration: Secondary | ICD-10-CM | POA: Diagnosis present

## 2024-01-09 DIAGNOSIS — E871 Hypo-osmolality and hyponatremia: Secondary | ICD-10-CM | POA: Diagnosis present

## 2024-01-09 DIAGNOSIS — E876 Hypokalemia: Secondary | ICD-10-CM | POA: Diagnosis present

## 2024-01-09 DIAGNOSIS — I10 Essential (primary) hypertension: Secondary | ICD-10-CM | POA: Diagnosis not present

## 2024-01-09 DIAGNOSIS — D72829 Elevated white blood cell count, unspecified: Secondary | ICD-10-CM | POA: Insufficient documentation

## 2024-01-09 DIAGNOSIS — Z8249 Family history of ischemic heart disease and other diseases of the circulatory system: Secondary | ICD-10-CM

## 2024-01-09 DIAGNOSIS — F05 Delirium due to known physiological condition: Secondary | ICD-10-CM | POA: Diagnosis present

## 2024-01-09 DIAGNOSIS — E782 Mixed hyperlipidemia: Secondary | ICD-10-CM | POA: Diagnosis present

## 2024-01-09 DIAGNOSIS — E039 Hypothyroidism, unspecified: Secondary | ICD-10-CM | POA: Diagnosis present

## 2024-01-09 DIAGNOSIS — Z950 Presence of cardiac pacemaker: Secondary | ICD-10-CM

## 2024-01-09 DIAGNOSIS — M1711 Unilateral primary osteoarthritis, right knee: Secondary | ICD-10-CM | POA: Diagnosis present

## 2024-01-09 DIAGNOSIS — R5381 Other malaise: Secondary | ICD-10-CM | POA: Diagnosis present

## 2024-01-09 DIAGNOSIS — K219 Gastro-esophageal reflux disease without esophagitis: Secondary | ICD-10-CM | POA: Diagnosis present

## 2024-01-09 DIAGNOSIS — Z880 Allergy status to penicillin: Secondary | ICD-10-CM

## 2024-01-09 DIAGNOSIS — Z515 Encounter for palliative care: Secondary | ICD-10-CM

## 2024-01-09 DIAGNOSIS — Z79890 Hormone replacement therapy (postmenopausal): Secondary | ICD-10-CM

## 2024-01-09 DIAGNOSIS — I4819 Other persistent atrial fibrillation: Principal | ICD-10-CM | POA: Diagnosis present

## 2024-01-09 DIAGNOSIS — Z887 Allergy status to serum and vaccine status: Secondary | ICD-10-CM

## 2024-01-09 DIAGNOSIS — W1839XA Other fall on same level, initial encounter: Secondary | ICD-10-CM | POA: Diagnosis present

## 2024-01-09 DIAGNOSIS — L03113 Cellulitis of right upper limb: Secondary | ICD-10-CM | POA: Diagnosis present

## 2024-01-09 DIAGNOSIS — Z6830 Body mass index (BMI) 30.0-30.9, adult: Secondary | ICD-10-CM

## 2024-01-09 DIAGNOSIS — Z79899 Other long term (current) drug therapy: Secondary | ICD-10-CM

## 2024-01-09 DIAGNOSIS — I4891 Unspecified atrial fibrillation: Principal | ICD-10-CM

## 2024-01-09 DIAGNOSIS — Z66 Do not resuscitate: Secondary | ICD-10-CM | POA: Diagnosis present

## 2024-01-09 DIAGNOSIS — G9341 Metabolic encephalopathy: Secondary | ICD-10-CM | POA: Diagnosis present

## 2024-01-09 DIAGNOSIS — Y92003 Bedroom of unspecified non-institutional (private) residence as the place of occurrence of the external cause: Secondary | ICD-10-CM

## 2024-01-09 LAB — CBC WITH DIFFERENTIAL/PLATELET
Abs Immature Granulocytes: 0.05 K/uL (ref 0.00–0.07)
Basophils Absolute: 0 K/uL (ref 0.0–0.1)
Basophils Relative: 0 %
Eosinophils Absolute: 0.5 K/uL (ref 0.0–0.5)
Eosinophils Relative: 4 %
HCT: 52.5 % — ABNORMAL HIGH (ref 36.0–46.0)
Hemoglobin: 17.7 g/dL — ABNORMAL HIGH (ref 12.0–15.0)
Immature Granulocytes: 0 %
Lymphocytes Relative: 9 %
Lymphs Abs: 1.2 K/uL (ref 0.7–4.0)
MCH: 30.9 pg (ref 26.0–34.0)
MCHC: 33.7 g/dL (ref 30.0–36.0)
MCV: 91.6 fL (ref 80.0–100.0)
Monocytes Absolute: 1.5 K/uL — ABNORMAL HIGH (ref 0.1–1.0)
Monocytes Relative: 11 %
Neutro Abs: 9.5 K/uL — ABNORMAL HIGH (ref 1.7–7.7)
Neutrophils Relative %: 76 %
Platelets: 179 K/uL (ref 150–400)
RBC: 5.73 MIL/uL — ABNORMAL HIGH (ref 3.87–5.11)
RDW: 14.6 % (ref 11.5–15.5)
WBC: 12.7 K/uL — ABNORMAL HIGH (ref 4.0–10.5)
nRBC: 0 % (ref 0.0–0.2)

## 2024-01-09 LAB — COMPREHENSIVE METABOLIC PANEL WITH GFR
ALT: 18 U/L (ref 0–44)
AST: 30 U/L (ref 15–41)
Albumin: 3.8 g/dL (ref 3.5–5.0)
Alkaline Phosphatase: 146 U/L — ABNORMAL HIGH (ref 38–126)
Anion gap: 13 (ref 5–15)
BUN: 13 mg/dL (ref 8–23)
CO2: 28 mmol/L (ref 22–32)
Calcium: 9.4 mg/dL (ref 8.9–10.3)
Chloride: 90 mmol/L — ABNORMAL LOW (ref 98–111)
Creatinine, Ser: 0.67 mg/dL (ref 0.44–1.00)
GFR, Estimated: >60 mL/min (ref >60)
Glucose, Bld: 114 mg/dL — ABNORMAL HIGH (ref 70–99)
Potassium: 3 mmol/L — ABNORMAL LOW (ref 3.5–5.1)
Sodium: 131 mmol/L — ABNORMAL LOW (ref 135–145)
Total Bilirubin: 3.1 mg/dL — ABNORMAL HIGH (ref 0.0–1.2)
Total Protein: 7.9 g/dL (ref 6.5–8.1)

## 2024-01-09 LAB — PROTIME-INR
INR: 1.3 — ABNORMAL HIGH (ref 0.8–1.2)
Prothrombin Time: 16.6 s — ABNORMAL HIGH (ref 11.4–15.2)

## 2024-01-09 LAB — TYPE AND SCREEN
ABO/RH(D): AB POS
Antibody Screen: NEGATIVE

## 2024-01-09 LAB — DIGOXIN LEVEL: Digoxin Level: <0.6 ng/mL — ABNORMAL LOW (ref 0.8–2.0)

## 2024-01-09 LAB — MAGNESIUM: Magnesium: 2 mg/dL (ref 1.7–2.4)

## 2024-01-09 LAB — CK: Total CK: 79 U/L (ref 38–234)

## 2024-01-09 MED ORDER — VANCOMYCIN HCL IN DEXTROSE 1-5 GM/200ML-% IV SOLN
1000.0000 mg | Freq: Once | INTRAVENOUS | Status: AC
  Administered 2024-01-10: 1000 mg via INTRAVENOUS
  Filled 2024-01-09: qty 200

## 2024-01-09 MED ORDER — DILTIAZEM HCL-DEXTROSE 125-5 MG/125ML-% IV SOLN (PREMIX)
5.0000 mg/h | INTRAVENOUS | Status: DC
  Administered 2024-01-09: 5 mg/h via INTRAVENOUS
  Administered 2024-01-10: 12.5 mg/h via INTRAVENOUS
  Filled 2024-01-09 (×3): qty 125

## 2024-01-09 MED ORDER — DILTIAZEM LOAD VIA INFUSION
20.0000 mg | Freq: Once | INTRAVENOUS | Status: AC
  Administered 2024-01-09: 20 mg via INTRAVENOUS
  Filled 2024-01-09: qty 20

## 2024-01-09 NOTE — H&P (Incomplete)
 History and Physical    Patient: Denise Pugh FMW:[id number] DOB: 1928/08/07 DOA: 01/09/2024 DOS: the patient was seen and examined on 01/10/2024 PCP: Shona Norleen PEDLAR, MD  Patient coming from: Home  Chief Complaint:  Chief Complaint  Patient presents with   Fall   HPI: Denise Pugh is a 89 y.o. female with medical history significant of persistent atrial fibrillation, hypothyroidism, hyperlipidemia, GERD, s/p pacemaker, anxiety who presents to the emergency department via EMS due to fall sustained at home.  Patient did not remember when and how she fell, some of the history was obtained from daughter at bedside, apparently she fell on her knees and presents with swollen right knee. Family checked on her and find her on the bed with legs being supported by a chair, EMS was activated and on arrival of EMS team, she was helped to get up and had difficulty in being able to bear weight due to right knee pain and swelling, she was also noted to be in A-fib with RVR with rates ranging within 150 to 160 BPM.  ED Course:  In the emergency department, she was tachypneic, tachycardic and BP was 178/142.  Temperature 36F and O2 sat was 98% on room air.  Workup in the ED showed WBC 12.7, hemoglobin 17.7, hematocrit 50.5, MCV 94.6, platelets 179.  BMP sodium 131, potassium 3.0, chloride 98, bicarb 28, blood glucose 114, BUN 13, creatinine 0.67, ALP 146, total bilirubin 3.1, digoxin  < 0.6, magnesium 2.0, total CK 79 Right wrist x-ray showed mild soft tissue swelling of the wrist with no acute fracture or dislocation X-ray of pelvis showed no acute displaced fracture or dislocation of the pelvis or hips Right knee x-ray was suspicious for joint effusion and show severe tricompartmental degenerative changes of the knee Chest x-ray showed no acute cardiopulmonary disease. Patient was started on IV Cardizem  drip due to A-fib with RVR.  Vancomycin  was given due to suspicion for right wrist cellulitis.  Potassium  was replenished  Review of Systems: Review of systems as noted in the HPI. All other systems reviewed and are negative.   Past Medical History:  Diagnosis Date   Arrhythmia    HX of SVT. CARDIONET MONITOR 07/15/12 TO 08/13/12   Arthritis    Family history of adverse reaction to anesthesia 1970s   mother   GERD (gastroesophageal reflux disease)    Hyperlipidemia 08/06/11   lexiscan myoview-normal. Due to severe attenuation the study specificity and sensitivty are deminished.   Hypertension 08/25/10   ECHO-EF 60-65%   Hypothyroidism    Irregular heart beat    Shortness of breath    Sleep apnea    SLEEP STUDY-[facility]   Thyroid  disease    Past Surgical History:  Procedure Laterality Date   CARDIAC CATHETERIZATION  07/06/01   spade-like configuration   CARDIOVERSION N/A 06/07/2017   Procedure: CARDIOVERSION;  Surgeon: Francyne Headland, MD;  Location: [facility] ENDOSCOPY;  Service: Cardiovascular;  Laterality: N/A;   CARDIOVERSION N/A 04/14/2018   Procedure: CARDIOVERSION;  Surgeon: Francyne Headland, MD;  Location: [facility] ENDOSCOPY;  Service: Cardiovascular;  Laterality: N/A;   DILATION AND CURETTAGE OF UTERUS     EYE SURGERY     KNEE ARTHROSCOPY     PERMANENT PACEMAKER INSERTION N/A 07/20/2013   Procedure: PERMANENT PACEMAKER INSERTION;  Surgeon: Headland Francyne, MD;  Location: [facility] CATH LAB;  Service: Cardiovascular;  Laterality: N/A;   PPM GENERATOR CHANGEOUT N/A 10/15/2022   Procedure: PPM GENERATOR CHANGEOUT;  Surgeon: Croitoru,  Jerel, MD;  Location: [facility] INVASIVE CV LAB;  Service: Cardiovascular;  Laterality: N/A;    Social History:  reports that she has never smoked. She has never used smokeless tobacco. She reports that she does not drink alcohol and does not use drugs.   Allergies  Allergen Reactions   Penicillins Other (See Comments)    Has patient had a PCN reaction causing immediate rash, facial/tongue/throat swelling, SOB or lightheadedness with hypotension:  Unknown Has patient had a PCN reaction causing severe rash involving mucus membranes or skin necrosis: Unknown Has patient had a PCN reaction that required hospitalization: Unknown Has patient had a PCN reaction occurring within the last 10 years: Unknown If all of the above answers are NO, then may proceed with Cephalosporin use.    Tetanus Toxoid Swelling    Family History  Problem Relation Age of Onset   Diabetes Mother    Hypertension Father    Diabetes Father    Heart attack Maternal Grandmother    Heart attack Maternal Grandfather      Prior to Admission medications   Medication Sig Start Date End Date Taking? Authorizing Provider  acetaminophen  (TYLENOL ) 500 MG tablet Take 500 mg by mouth every 6 (six) hours as needed for moderate pain, fever or headache.    [provider]  apixaban  (ELIQUIS ) 5 MG TABS tablet Take 1 tablet (5 mg total) by mouth 2 (two) times daily. NEEDS CARDIOLOGY APPT FOR REFILLS, CALL OFFICE [phone] 12/27/23   Croitoru, Mihai, MD  atorvastatin  (LIPITOR) 20 MG tablet TAKE 1/2 TABLET BY MOUTH ONCE DAILY. 03/02/23   Burnard Debby LABOR, MD  buPROPion (WELLBUTRIN XL) 150 MG 24 hr tablet 150 mg daily.    [provider]  buPROPion (WELLBUTRIN) 75 MG tablet Take 75 mg by mouth 2 (two) times daily. 10/07/20   [provider]  Cholecalciferol  (VITAMIN D ) 2000 UNITS tablet Take 2,000 Units by mouth daily.    [provider]  diclofenac  Sodium (VOLTAREN ) 1 % GEL Apply topically as needed. 1% gel as needed for pain 03/15/19   [provider]  digoxin  (LANOXIN ) 0.125 MG tablet TAKE ONE TABLET BY MOUTH ONCE DAILY. 12/02/22   Burnard Debby LABOR, MD  DULoxetine (CYMBALTA) 30 MG capsule Take 30 mg by mouth daily. 10/27/21   [provider]  escitalopram  (LEXAPRO ) 20 MG tablet TAKE 1 TABLET BY MOUTH  DAILY 08/06/20   Croitoru, Mihai, MD  esomeprazole (NEXIUM) 40 MG capsule Take 40 mg by mouth daily.    [provider]   hydrochlorothiazide  (MICROZIDE ) 12.5 MG capsule Take 1 capsule (12.5 mg total) by mouth daily. 11/30/23   Croitoru, Mihai, MD  levothyroxine  (SYNTHROID ) 88 MCG tablet Take 88 mcg by mouth daily. 11/28/21   [provider]  Liniments (DEEP BLUE RELIEF EX) Apply 1 application  topically daily as needed (for back, shoulder and knee pain).    [provider]  metoprolol  tartrate (LOPRESSOR ) 100 MG tablet Take 1.5 tablets (150 mg total) by mouth 2 (two) times daily. 07/26/23   Croitoru, Mihai, MD  Multiple Vitamins-Minerals (PRESERVISION AREDS 2 PO) Take 2 tablets by mouth daily.    [provider]  mupirocin ointment (BACTROBAN) 2 % Place 1 application  into the nose daily as needed (for dry nose). Patient not taking: Reported on 10/15/2022    [provider]  nystatin (MYCOSTATIN/NYSTOP) powder Apply topically 2 (two) times daily. Pt uses as needed. Patient not taking: Reported on 10/15/2022 09/21/19   [provider]  OVER THE COUNTER MEDICATION Place 1 drop into both ears daily. Organic Ear Oil Pt uses as needed Patient not taking: Reported on 10/15/2022    [provider]  oxybutynin  (DITROPAN ) 5 MG tablet Take 5 mg by mouth 2 (two) times daily.  01/21/15   [provider]  sodium chloride  (OCEAN) 0.65 % SOLN nasal spray Place 1 spray into both nostrils as needed for congestion. Patient not taking: Reported on 10/15/2022    [provider]  traMADol (ULTRAM) 50 MG tablet Take 100 mg by mouth 2 (two) times daily.  08/09/12   [provider]    Physical Exam: BP (!) 115/48   Pulse 93   Temp 98.9 F (37.2 C) (Axillary)   Resp (!) 21   Ht 5' 3 (1.6 m)   Wt 77.1 kg   SpO2 95%   BMI 30.11 kg/m   General: 89 y.o. year-old female well developed well nourished in no acute distress.  Alert and oriented x3. HEENT: NCAT, EOMI, dry mucous membrane Neck: Supple, trachea medial Cardiovascular: Tachycardia.  Irregular rate and  rhythm with no rubs or gallops.  No thyromegaly or JVD noted. 2/4 pulses in all 4 extremities. Respiratory: Clear to auscultation with no wheezes or rales. Good inspiratory effort. Abdomen: Soft, nontender nondistended with normal bowel sounds x4 quadrants. Muskuloskeletal: Right knee swelling with tenderness to palpation.  No cyanosis, clubbing noted bilaterally Neuro: CN II-XII intact, strength 5/5 x 4, sensation, reflexes intact Skin: Noted swelling of right thumb, DIP of right hand erythema of dorsal surface of hand extending to the fingers and tender to palpation.  Psychiatry: Mood is appropriate for condition and setting          Labs on Admission:  Basic Metabolic Panel: Recent Labs  Lab 01/09/24 2140  NA 131*  K 3.0*  CL 90*  CO2 28  GLUCOSE 114*  BUN 13  CREATININE 0.67  CALCIUM  9.4  MG 2.0   Liver Function Tests: Recent Labs  Lab 01/09/24 2140  AST 30  ALT 18  ALKPHOS 146*  BILITOT 3.1*  PROT 7.9  ALBUMIN 3.8   No results for input(s): LIPASE, AMYLASE in the last 168 hours. No results for input(s): AMMONIA in the last 168 hours. CBC: Recent Labs  Lab 01/09/24 2140  WBC 12.7*  NEUTROABS 9.5*  HGB 17.7*  HCT 52.5*  MCV 91.6  PLT 179   Cardiac Enzymes: Recent Labs  Lab 01/09/24 2140  CKTOTAL 79    BNP (last 3 results) No results for input(s): BNP in the last 8760 hours.  ProBNP (last 3 results) No results for input(s): PROBNP in the last 8760 hours.  CBG: No results for input(s): GLUCAP in the last 168 hours.  Radiological Exams on Admission: DG Wrist Complete Right Result Date: 01/09/2024 EXAM: 3 or more VIEW(S) XRAY OF THE WRIST 01/09/2024 11:18:54 PM COMPARISON: X-rays 08/22/2022. CLINICAL HISTORY: Trauma, swelling. Patient from home alone brought in by [facility]. Complains of falling yesterday and falling on her knees. Right knee notably swollen. EMS states patient was lying in bed when they arrived. Her back was on the bed and her  legs were supported by a chair. Family stated they got her up but patient states she got up on her own. Patient is confused, stated president is Zachary Neighbors. Patient not in pain, just uncomfortable. Patient in Afib in triage. EMS states she is on Eliquis . FINDINGS: BONES AND JOINTS: Degenerative changes of the first [facility] and STT  joints. Chondrocalcinosis of the TFCC. Chronic scapholunate widening suggesting ligamentous injury. No acute fracture. No focal osseous lesion. No joint dislocation. SOFT TISSUES: Mild soft tissue swelling of the wrist. IMPRESSION: 1. No acute fracture or dislocation. Electronically signed by: Norman Gatlin MD 01/09/2024 11:28 PM EDT RP Workstation: [id number]   DG Hand Complete Right Result Date: 01/09/2024 EXAM: 3 OR MORE VIEW(S) XRAY OF THE HAND 01/09/2024 11:18:54 PM COMPARISON: None available. CLINICAL HISTORY: Trauma and swelling. Patient reports falling yesterday, resulting in right knee swelling. EMS found the patient in bed. Patient is confused, reports no pain but discomfort, and is in Afib, on Eliquis . FINDINGS: BONES AND JOINTS: No acute fracture. No focal osseous lesion. No joint dislocation. Severe degenerative changes about the hand most pronounced at the interphalangeal joints. SOFT TISSUES: Soft tissue swelling about the index finger. Vascular calcifications. IMPRESSION: 1. Severe degenerative changes of the hand, most pronounced at the interphalangeal joints. 2. No acute osseous abnormality. Electronically signed by: Norman Gatlin MD 01/09/2024 11:27 PM EDT RP Workstation: [id number]   DG Chest 1 View Result Date: 01/09/2024 EXAM: 1 VIEW(S) XRAY OF THE CHEST 01/09/2024 10:31:00 PM COMPARISON: Chest x-ray 07/21/2016. CLINICAL HISTORY: 809823 Fall 190176. Family stated ; they got her up but pt states she got up on her own. Pt is confused stated president in [place]. Pt not in pain. Just uncomfortable. Pt in Afib in triage. FINDINGS: LUNGS AND PLEURA: Chronic  coarsened interstitial markings. No overt pulmonary edema. No pleural effusion. No pneumothorax. HEART AND MEDIASTINUM: Unchanged cardiomediastinal silhouette. Cardiomegaly. Atherosclerotic plaque. Left chest wall daily pacemaker. BONES AND SOFT TISSUES: No acute osseous abnormality. IMPRESSION: 1. No acute cardiopulmonary disease. Electronically signed by: Morgane Naveau MD 01/09/2024 10:40 PM EDT RP Workstation: [id number]   DG Pelvis 1-2 Views Result Date: 01/09/2024 EXAM: 1 or 2 VIEW(S) XRAY OF THE PELVIS 01/09/2024 10:31:00 PM COMPARISON: None available. CLINICAL HISTORY: 809823 Fall 190176. Pt from home alone brought in my [facility]. Complains of falling yesterday and falling on her knees. R knee notably swollen. EMS states patient was lying in bed when they arrived. Her back was on the bed and her legs were supported by a chair. Family stated ; they got her up but pt states she got up on her own. Pt is confused stated president in [place]. Pt not in pain. Just uncomfortable. Pt in Afib in triage. EMS states she is on Eliquis .; Best images obtainable due to pt condition FINDINGS: BONES AND JOINTS: Left sacroiliac joint sclerosis and partial syndesmosis. No diastasis of the pelvis. No acute displaced fracture of the bones of the pelvis. No acute displaced fracture or dislocation of either hips on frontal view. Degenerative changes in the visualized lower lumbar spine. SOFT TISSUES: The soft tissues are unremarkable. IMPRESSION: 1. No acute displaced fracture or dislocation of the pelvis or hips. 2. Left sacroiliac joint sclerosis and partial syndesmosis. Electronically signed by: Morgane Naveau MD 01/09/2024 10:37 PM EDT RP Workstation: [id number]   DG Knee Complete 4 Views Right Result Date: 01/09/2024 EXAM: 4 OR MORE VIEW(S) XRAY OF THE RIGHT KNEE 01/09/2024 10:31:00 PM COMPARISON: None available. CLINICAL HISTORY: Fall 190176. Pt from home alone brought in my [facility]. Complains of falling yesterday and  falling on her knees. R knee notably swollen. EMS states patient was lying in bed when they arrived. Her back was on the bed and her legs were supported by a chair. Family stated ; they got her up but pt states she got up on her  own. Pt is confused stated president in [place]. Pt not in pain. Just uncomfortable. Pt in Afib in triage. EMS states she is on Eliquis .; Best images obtainable due to pt condition FINDINGS: BONES AND JOINTS: No acute fracture. No focal osseous lesion. No joint dislocation. Question joint effusion with limited evaluation on this incomplete lateral view. Severe tricompartmental degenerative changes of the knee. SOFT TISSUES: The soft tissues are unremarkable. IMPRESSION: 1. Question joint effusion with limited evaluation on this incomplete lateral view. 2. Severe tricompartmental degenerative changes of the knee. Electronically signed by: Morgane Naveau MD 01/09/2024 10:35 PM EDT RP Workstation: [id number]   DG Lumbar Spine Complete Result Date: 01/09/2024 EXAM: 4 VIEW(S) XRAY OF THE LUMBAR SPINE 01/09/2024 10:31:00 PM COMPARISON: MRI of the lumbar spine 06/19/2014. CLINICAL HISTORY: Trauma, fall, and atrial fibrillation. Patient complains of falling yesterday and falling on her knees, with notable right knee swelling. EMS found the patient lying in bed with her back on the bed and legs supported by a chair. Patient is confused, reports no pain but discomfort, and is in atrial fibrillation, reportedly on Eliquis . Images were best obtainable due to patient condition. FINDINGS: LUMBAR SPINE: BONES: No acute fracture. No aggressive appearing osseous lesion. Alignment is normal. DISCS AND DEGENERATIVE CHANGES: Multilevel advanced spondylosis, disc space height loss, and degenerative endplate changes. Multilevel facet arthropathy most advanced in the lower lumbar spine. SOFT TISSUES: No acute abnormality. IMPRESSION: 1. No acute osseous abnormality of the lumbar spine. 2. Multilevel  advanced spondylosis . Electronically signed by: Norman Gatlin MD 01/09/2024 10:34 PM EDT RP Workstation: [id number]   CT Head Wo Contrast Result Date: 01/09/2024 EXAM: CT HEAD WITHOUT CONTRAST 01/09/2024 10:06:07 PM TECHNIQUE: CT of the head was performed without the administration of intravenous contrast. Automated exposure control, iterative reconstruction, and/or weight based adjustment of the mA/kV was utilized to reduce the radiation dose to as low as reasonably achievable. COMPARISON: Comparison with 03/24/216. CLINICAL HISTORY: Facial trauma, blunt. Fall. Patient reports falling yesterday and falling on her knees. Right knee is notably swollen. Patient was found lying in bed with legs supported by a chair. Patient is in atrial fibrillation (Afib) in triage and is on Eliquis . FINDINGS: BRAIN AND VENTRICLES: No acute hemorrhage. No evidence of acute infarct. No hydrocephalus. No extra-axial collection. No mass effect or midline shift. Nonspecific periventricular and subcortical white matter hypoattenuation, likely sequela of chronic microvascular ischemic change. Mild age-related parenchymal volume loss. ORBITS: Bilateral lens replacement. SINUSES: No acute abnormality. SOFT TISSUES AND SKULL: No acute soft tissue abnormality. No skull fracture. IMPRESSION: 1. No acute intracranial abnormality. Electronically signed by: Norman Gatlin MD 01/09/2024 10:09 PM EDT RP Workstation: [id number]    EKG: I independently viewed the EKG done and my findings are as followed: Atrial fibrillation with RVR with incomplete RBBB and LAFB  Assessment/Plan Present on Admission:  Paroxysmal atrial fibrillation with RVR (HCC)  Essential hypertension  Mixed hyperlipidemia  GERD (gastroesophageal reflux disease)  Principal Problem:   Paroxysmal atrial fibrillation with RVR (HCC) Active Problems:   Essential hypertension   GERD (gastroesophageal reflux disease)   Mixed hyperlipidemia   Cellulitis of right  hand   Hypokalemia   Hyponatremia   Leukocytosis  Paroxysmal atrial fibrillation with RVR Patient will be admitted to stepdown unit Continue IV Cardizem  drip Continue telemetry Consider transitioning to home meds when rate becomes controlled  Presumed cellulitis of the right hand He was treated with IV vancomycin  in the ED We shall continue with IV Ancef  Hypokalemia K+  3.0, this was replenished  Hyponatremia Na 131, baseline at 134 Continue IV hydration  Leukocytosis Elevated hemoglobin/hematocrit This is possibly due to reactive process such as hemoconcentration due to dehydration Gentle hydration will be provided  Essential hypertension Continue IV Cardizem  drip and transition to oral meds when patient is weaned off Cardizem  drip  Mixed hyperlipidemia Continue Lipitor  GERD Continue Protonix   DVT prophylaxis: Eliquis   Code Status: Full code  Family Communication: Daughter at bedside (all questions answered to satisfaction)  Consults: None  Severity of Illness: The appropriate patient status for this patient is OBSERVATION. Observation status is judged to be reasonable and necessary in order to provide the required intensity of service to ensure the patient's safety. The patient's presenting symptoms, physical exam findings, and initial radiographic and laboratory data in the context of their medical condition is felt to place them at decreased risk for further clinical deterioration. Furthermore, it is anticipated that the patient will be medically stable for discharge from the hospital within 2 midnights of admission.   Author: Franklin Baumbach, DO 01/10/2024 6:45 AM  For on call review [email or web address].

## 2024-01-09 NOTE — ED Triage Notes (Signed)
 Pt from home alone brought in my [facility]. Complains of falling yesterday and falling on her knees. R knee notably swollen. EMS states patient was lying in bed when they arrived. Her back was on the bed and her legs were supported by a chair. Family stated they got her up but pt states she got up on her own. Pt is confused stated president in [place]. Pt not in pain. Just uncomfortable. Pt in Afib in triage. EMS states she is on Eliquis .

## 2024-01-09 NOTE — ED Provider Notes (Signed)
 [place] [facility] AT [facility] Provider Note   CSN: [id number] Arrival date & time: 01/09/24  2059     Patient presents with: Denise Pugh   Denise Pugh is a 89 y.o. female.    Fall   This patient is a 89 year old female, history of atrial fibrillation on Eliquis  twice a day, history of a pacemaker, history of being on digoxin , history of anxiety on Wellbutrin, she presents to the hospital after having a fall, she does not remember when she fell or how she fell, she was found on the floor by family members.  They called the paramedics and found the patient to be in good spirits but unable to get up because of severe swelling of her right knee, inability to stand and bear her own weight, does not complain of chest pain or shortness of breath, she has some generalized weakness but denies any focal weakness.  Paramedics reported that she was asymptomatic of A-fib but had heart rates between 150 and 160 bpm.  The patient has a history of atrial fibrillation on Eliquis , she also has a history of a generator change out for her pacemaker in July 2024, review of that pacemaker showed that she has a [employer] model.  The last echocardiogram was from 2019 and showed an ejection fraction of 60 to 65%, grade 3 diastolic dysfunction      Prior to Admission medications   Medication Sig Start Date End Date Taking? Authorizing Provider  acetaminophen  (TYLENOL ) 500 MG tablet Take 500 mg by mouth every 6 (six) hours as needed for moderate pain, fever or headache.    [provider]  apixaban  (ELIQUIS ) 5 MG TABS tablet Take 1 tablet (5 mg total) by mouth 2 (two) times daily. NEEDS CARDIOLOGY APPT FOR REFILLS, CALL OFFICE [phone] 12/27/23   Croitoru, Mihai, MD  atorvastatin  (LIPITOR) 20 MG tablet TAKE 1/2 TABLET BY MOUTH ONCE DAILY. 03/02/23   Burnard Debby LABOR, MD  buPROPion (WELLBUTRIN XL) 150 MG 24 hr tablet 150 mg daily.    [provider]  buPROPion  (WELLBUTRIN) 75 MG tablet Take 75 mg by mouth 2 (two) times daily. 10/07/20   [provider]  Cholecalciferol  (VITAMIN D ) 2000 UNITS tablet Take 2,000 Units by mouth daily.    [provider]  diclofenac  Sodium (VOLTAREN ) 1 % GEL Apply topically as needed. 1% gel as needed for pain 03/15/19   [provider]  digoxin  (LANOXIN ) 0.125 MG tablet TAKE ONE TABLET BY MOUTH ONCE DAILY. 12/02/22   Burnard Debby LABOR, MD  DULoxetine (CYMBALTA) 30 MG capsule Take 30 mg by mouth daily. 10/27/21   [provider]  escitalopram  (LEXAPRO ) 20 MG tablet TAKE 1 TABLET BY MOUTH  DAILY 08/06/20   Croitoru, Mihai, MD  esomeprazole (NEXIUM) 40 MG capsule Take 40 mg by mouth daily.    [provider]  hydrochlorothiazide  (MICROZIDE ) 12.5 MG capsule Take 1 capsule (12.5 mg total) by mouth daily. 11/30/23   Croitoru, Mihai, MD  levothyroxine  (SYNTHROID ) 88 MCG tablet Take 88 mcg by mouth daily. 11/28/21   [provider]  Liniments (DEEP BLUE RELIEF EX) Apply 1 application  topically daily as needed (for back, shoulder and knee pain).    [provider]  metoprolol  tartrate (LOPRESSOR ) 100 MG tablet Take 1.5 tablets (150 mg total) by mouth 2 (two) times daily. 07/26/23   Croitoru, Mihai, MD  Multiple Vitamins-Minerals (PRESERVISION AREDS 2 PO) Take 2 tablets by mouth daily.    [provider]  mupirocin ointment (BACTROBAN) 2 % Place 1 application  into the nose daily as needed (for dry nose). Patient not taking: Reported on 10/15/2022    [provider]  nystatin (MYCOSTATIN/NYSTOP) powder Apply topically 2 (two) times daily. Pt uses as needed. Patient not taking: Reported on 10/15/2022 09/21/19   [provider]  OVER THE COUNTER MEDICATION Place 1 drop into both ears daily. Organic Ear Oil Pt uses as needed Patient not taking: Reported on 10/15/2022    [provider]  oxybutynin  (DITROPAN ) 5 MG tablet Take 5 mg by mouth 2 (two) times  daily.  01/21/15   [provider]  sodium chloride  (OCEAN) 0.65 % SOLN nasal spray Place 1 spray into both nostrils as needed for congestion. Patient not taking: Reported on 10/15/2022    [provider]  traMADol (ULTRAM) 50 MG tablet Take 100 mg by mouth 2 (two) times daily.  08/09/12   [provider]    Allergies: Penicillins and Tetanus toxoid    Review of Systems  All other systems reviewed and are negative.   Updated Vital Signs BP (!) 148/75   Pulse (!) 130   Temp 99 F (37.2 C) (Oral)   Resp 17   Ht 1.6 m (5' 3)   Wt 77.1 kg   SpO2 98%   BMI 30.11 kg/m   Physical Exam Vitals and nursing note reviewed.  Constitutional:      General: She is in acute distress.     Appearance: She is well-developed.  HENT:     Head: Normocephalic and atraumatic.     Mouth/Throat:     Pharynx: No oropharyngeal exudate.  Eyes:     General: No scleral icterus.       Right eye: No discharge.        Left eye: No discharge.     Conjunctiva/sclera: Conjunctivae normal.     Pupils: Pupils are equal, round, and reactive to light.  Neck:     Thyroid : No thyromegaly.     Vascular: No JVD.  Cardiovascular:     Rate and Rhythm: Regular rhythm. Tachycardia present.     Heart sounds: Normal heart sounds. No murmur heard.    No friction rub. No gallop.  Pulmonary:     Effort: Pulmonary effort is normal. No respiratory distress.     Breath sounds: Normal breath sounds. No wheezing or rales.  Abdominal:     General: Bowel sounds are normal. There is no distension.     Palpations: Abdomen is soft. There is no mass.     Tenderness: There is no abdominal tenderness.  Musculoskeletal:        General: Swelling, tenderness and signs of injury present.     Cervical back: Normal range of motion and neck supple.     Right lower leg: No edema.     Left lower leg: No edema.     Comments: Inability to straight leg raise on the right with significant swelling of the right  knee and tenderness over the anterior knee.  Decreased range of motion secondary to significant pain.  No tenderness over the hips or the ankles, no upper extremity injuries  Lymphadenopathy:     Cervical: No cervical adenopathy.  Skin:    General: Skin is warm and dry.     Findings: Erythema and rash present.     Comments: Pain and tenderness with range of motion of the hand and the wrist on the right side,  erythema extending from the distal forearm through the fingers  Neurological:     Mental Status: She is alert.     Coordination: Coordination normal.  Psychiatric:        Behavior: Behavior normal.     (all labs ordered are listed, but only abnormal results are displayed) Labs Reviewed  CBC WITH DIFFERENTIAL/PLATELET - Abnormal; Notable for the following components:      Result Value   WBC 12.7 (*)    RBC 5.73 (*)    Hemoglobin 17.7 (*)    HCT 52.5 (*)    Neutro Abs 9.5 (*)    Monocytes Absolute 1.5 (*)    All other components within normal limits  COMPREHENSIVE METABOLIC PANEL WITH GFR - Abnormal; Notable for the following components:   Sodium 131 (*)    Potassium 3.0 (*)    Chloride 90 (*)    Glucose, Bld 114 (*)    Alkaline Phosphatase 146 (*)    Total Bilirubin 3.1 (*)    All other components within normal limits  PROTIME-INR - Abnormal; Notable for the following components:   Prothrombin Time 16.6 (*)    INR 1.3 (*)    All other components within normal limits  DIGOXIN  LEVEL - Abnormal; Notable for the following components:   Digoxin  Level <0.6 (*)    All other components within normal limits  MAGNESIUM  CK  TYPE AND SCREEN    EKG: EKG Interpretation Date/Time:  Sunday January 09 2024 21:12:03 EDT Ventricular Rate:  154 PR Interval:    QRS Duration:  107 QT Interval:  277 QTC Calculation: 444 R Axis:   -3  Text Interpretation: Atrial fibrillation with rapid V-rate Incomplete RBBB and LAFB Consider right ventricular hypertrophy Repolarization  abnormality, prob rate related Since last tracing paced rhythm replaced by afib Confirmed by Cleotilde Rogue ([id number]) on 01/09/2024 9:15:56 PM  Radiology: [facility] Chest 1 View Result Date: 01/09/2024 EXAM: 1 VIEW(S) XRAY OF THE CHEST 01/09/2024 10:31:00 PM COMPARISON: Chest x-ray 07/21/2016. CLINICAL HISTORY: 809823 Fall 190176. Family stated ; they got her up but pt states she got up on her own. Pt is confused stated president in [place]. Pt not in pain. Just uncomfortable. Pt in Afib in triage. FINDINGS: LUNGS AND PLEURA: Chronic coarsened interstitial markings. No overt pulmonary edema. No pleural effusion. No pneumothorax. HEART AND MEDIASTINUM: Unchanged cardiomediastinal silhouette. Cardiomegaly. Atherosclerotic plaque. Left chest wall daily pacemaker. BONES AND SOFT TISSUES: No acute osseous abnormality. IMPRESSION: 1. No acute cardiopulmonary disease. Electronically signed by: Morgane Naveau MD 01/09/2024 10:40 PM EDT RP Workstation: [id number]   DG Pelvis 1-2 Views Result Date: 01/09/2024 EXAM: 1 or 2 VIEW(S) XRAY OF THE PELVIS 01/09/2024 10:31:00 PM COMPARISON: None available. CLINICAL HISTORY: 809823 Fall 190176. Pt from home alone brought in my REMS. Complains of falling yesterday and falling on her knees. R knee notably swollen. EMS states patient was lying in bed when they arrived. Her back was on the bed and her legs were supported by a chair. Family stated ; they got her up but pt states she got up on her own. Pt is confused stated president in [place]. Pt not in pain. Just uncomfortable. Pt in Afib in triage. EMS states she is on Eliquis .; Best images obtainable due to pt condition FINDINGS: BONES AND JOINTS: Left sacroiliac joint sclerosis and partial syndesmosis. No diastasis of the pelvis. No acute displaced fracture of the bones of the pelvis. No acute displaced fracture or dislocation of either hips on  frontal view. Degenerative changes in the visualized lower lumbar spine. SOFT  TISSUES: The soft tissues are unremarkable. IMPRESSION: 1. No acute displaced fracture or dislocation of the pelvis or hips. 2. Left sacroiliac joint sclerosis and partial syndesmosis. Electronically signed by: Morgane Naveau MD 01/09/2024 10:37 PM EDT RP Workstation: [id number]   DG Knee Complete 4 Views Right Result Date: 01/09/2024 EXAM: 4 OR MORE VIEW(S) XRAY OF THE RIGHT KNEE 01/09/2024 10:31:00 PM COMPARISON: None available. CLINICAL HISTORY: Fall 190176. Pt from home alone brought in my REMS. Complains of falling yesterday and falling on her knees. R knee notably swollen. EMS states patient was lying in bed when they arrived. Her back was on the bed and her legs were supported by a chair. Family stated ; they got her up but pt states she got up on her own. Pt is confused stated president in [place]. Pt not in pain. Just uncomfortable. Pt in Afib in triage. EMS states she is on Eliquis .; Best images obtainable due to pt condition FINDINGS: BONES AND JOINTS: No acute fracture. No focal osseous lesion. No joint dislocation. Question joint effusion with limited evaluation on this incomplete lateral view. Severe tricompartmental degenerative changes of the knee. SOFT TISSUES: The soft tissues are unremarkable. IMPRESSION: 1. Question joint effusion with limited evaluation on this incomplete lateral view. 2. Severe tricompartmental degenerative changes of the knee. Electronically signed by: Morgane Naveau MD 01/09/2024 10:35 PM EDT RP Workstation: [id number]   DG Lumbar Spine Complete Result Date: 01/09/2024 EXAM: 4 VIEW(S) XRAY OF THE LUMBAR SPINE 01/09/2024 10:31:00 PM COMPARISON: MRI of the lumbar spine 06/19/2014. CLINICAL HISTORY: Trauma, fall, and atrial fibrillation. Patient complains of falling yesterday and falling on her knees, with notable right knee swelling. EMS found the patient lying in bed with her back on the bed and legs supported by a chair. Patient is confused, reports no pain but  discomfort, and is in atrial fibrillation, reportedly on Eliquis . Images were best obtainable due to patient condition. FINDINGS: LUMBAR SPINE: BONES: No acute fracture. No aggressive appearing osseous lesion. Alignment is normal. DISCS AND DEGENERATIVE CHANGES: Multilevel advanced spondylosis, disc space height loss, and degenerative endplate changes. Multilevel facet arthropathy most advanced in the lower lumbar spine. SOFT TISSUES: No acute abnormality. IMPRESSION: 1. No acute osseous abnormality of the lumbar spine. 2. Multilevel advanced spondylosis . Electronically signed by: Norman Gatlin MD 01/09/2024 10:34 PM EDT RP Workstation: [id number]   CT Head Wo Contrast Result Date: 01/09/2024 EXAM: CT HEAD WITHOUT CONTRAST 01/09/2024 10:06:07 PM TECHNIQUE: CT of the head was performed without the administration of intravenous contrast. Automated exposure control, iterative reconstruction, and/or weight based adjustment of the mA/kV was utilized to reduce the radiation dose to as low as reasonably achievable. COMPARISON: Comparison with 03/24/216. CLINICAL HISTORY: Facial trauma, blunt. Fall. Patient reports falling yesterday and falling on her knees. Right knee is notably swollen. Patient was found lying in bed with legs supported by a chair. Patient is in atrial fibrillation (Afib) in triage and is on Eliquis . FINDINGS: BRAIN AND VENTRICLES: No acute hemorrhage. No evidence of acute infarct. No hydrocephalus. No extra-axial collection. No mass effect or midline shift. Nonspecific periventricular and subcortical white matter hypoattenuation, likely sequela of chronic microvascular ischemic change. Mild age-related parenchymal volume loss. ORBITS: Bilateral lens replacement. SINUSES: No acute abnormality. SOFT TISSUES AND SKULL: No acute soft tissue abnormality. No skull fracture. IMPRESSION: 1. No acute intracranial abnormality. Electronically signed by: Norman Gatlin MD 01/09/2024 10:09 PM EDT RP  Workstation: [id number]     .  Critical Care  Performed by: Cleotilde Rogue, MD Authorized by: Cleotilde Rogue, MD   Critical care provider statement:    Critical care time (minutes):  45   Critical care time was exclusive of:  Separately billable procedures and treating other patients and teaching time   Critical care was necessary to treat or prevent imminent or life-threatening deterioration of the following conditions:  Cardiac failure and sepsis   Critical care was time spent personally by me on the following activities:  Development of treatment plan with patient or surrogate, discussions with consultants, evaluation of patient's response to treatment, examination of patient, obtaining history from patient or surrogate, review of old charts, re-evaluation of patient's condition, pulse oximetry, ordering and review of radiographic studies, ordering and review of laboratory studies and ordering and performing treatments and interventions   I assumed direction of critical care for this patient from another provider in my specialty: no     Care discussed with: admitting provider   Comments:          Medications Ordered in the ED  diltiazem  (CARDIZEM ) 1 mg/mL load via infusion 20 mg (20 mg Intravenous Bolus from Bag 01/09/24 2233)    And  diltiazem  (CARDIZEM ) 125 mg in dextrose  5% 125 mL (1 mg/mL) infusion (5 mg/hr Intravenous New Bag/Given 01/09/24 2241)  vancomycin  (VANCOCIN ) IVPB 1000 mg/200 mL premix (has no administration in time range)                                    Medical Decision Making Amount and/or Complexity of Data Reviewed Labs: ordered. Radiology: ordered.  Risk Prescription drug management.   EKG shows A-fib, patient has had a fall of unknown timeframe or cause, she does not remember, she does not appear to be acutely ill with regards to her mental status however she is very tachycardic and has a swollen knee consistent with a fall that might of injured it.   Suspect trauma.  Check head CT to rule out injury or bleed given that she is on Eliquis , x-ray of the knee the chest and the pelvis to make sure there is no fractures  Co morbidities / Chronic conditions that complicate the patient evaluation  Known atrial fibrillation on Eliquis , heart failure   Additional history obtained:  Additional history obtained from EMR External records from outside source obtained and reviewed including prior echocardiogram and pacemaker checks The daughter now arrives and states that the patient was actually found in her own bed too weak to get out of bed in her own urine tachycardic and slightly confused   Lab Tests:  I Ordered, and personally interpreted labs.  The pertinent results include: Leukocytosis of 12,700, metabolic panel which shows mild hypokalemia, sodium of 131, CK of 79 INR of 1.3 digoxin  is undetectably low   Imaging Studies ordered:  I ordered imaging studies including lumbar spine x-ray of the chest and the pelvis as well as the knee and a CT scan of the head I independently visualized and interpreted imaging which showed Vere arthritis of the knee, no other findings of trauma I agree with the radiologist interpretation   Cardiac Monitoring: / EKG:  The patient was maintained on a cardiac monitor.  I personally viewed and interpreted the cardiac monitored which showed an underlying rhythm of: Atrial fibrillation with rapid ventricular rate   Problem List / ED Course / Critical interventions / Medication management  This patient is critically ill with what appears to be A-fib with RVR, she is not febrile but does have a leukocytosis and a source of infection in her right wrist and hand which is erythematous all the way through the fingers.  I suspect that she has a skin and soft tissue infection I ordered medication including antibiotics, Cardizem  drip Reevaluation of the patient after these medicines showed that the patient  improvement in heart rate I have reviewed the patients home medicines and have made adjustments as needed   Consultations Obtained:  I requested consultation with the hospitalist,  and discussed lab and imaging findings as well as pertinent plan - they recommend: Admission to high level of care   Social Determinants of Health:  Atrial fibrillation with RVR   Test / Admission - Considered:  Admit      Final diagnoses:  None    ED Discharge Orders     None          Cleotilde Rogue, MD 01/09/24 2300

## 2024-01-10 DIAGNOSIS — E876 Hypokalemia: Secondary | ICD-10-CM | POA: Diagnosis present

## 2024-01-10 DIAGNOSIS — R5381 Other malaise: Secondary | ICD-10-CM | POA: Diagnosis present

## 2024-01-10 DIAGNOSIS — E871 Hypo-osmolality and hyponatremia: Secondary | ICD-10-CM | POA: Insufficient documentation

## 2024-01-10 DIAGNOSIS — E860 Dehydration: Secondary | ICD-10-CM | POA: Diagnosis present

## 2024-01-10 DIAGNOSIS — Z887 Allergy status to serum and vaccine status: Secondary | ICD-10-CM | POA: Diagnosis not present

## 2024-01-10 DIAGNOSIS — Z7189 Other specified counseling: Secondary | ICD-10-CM | POA: Diagnosis not present

## 2024-01-10 DIAGNOSIS — E039 Hypothyroidism, unspecified: Secondary | ICD-10-CM | POA: Diagnosis present

## 2024-01-10 DIAGNOSIS — L03113 Cellulitis of right upper limb: Secondary | ICD-10-CM | POA: Insufficient documentation

## 2024-01-10 DIAGNOSIS — R627 Adult failure to thrive: Secondary | ICD-10-CM

## 2024-01-10 DIAGNOSIS — Z79899 Other long term (current) drug therapy: Secondary | ICD-10-CM | POA: Diagnosis not present

## 2024-01-10 DIAGNOSIS — W1839XA Other fall on same level, initial encounter: Secondary | ICD-10-CM | POA: Diagnosis present

## 2024-01-10 DIAGNOSIS — Z66 Do not resuscitate: Secondary | ICD-10-CM

## 2024-01-10 DIAGNOSIS — K219 Gastro-esophageal reflux disease without esophagitis: Secondary | ICD-10-CM | POA: Diagnosis present

## 2024-01-10 DIAGNOSIS — I10 Essential (primary) hypertension: Secondary | ICD-10-CM | POA: Diagnosis present

## 2024-01-10 DIAGNOSIS — Z880 Allergy status to penicillin: Secondary | ICD-10-CM | POA: Diagnosis not present

## 2024-01-10 DIAGNOSIS — I480 Paroxysmal atrial fibrillation: Secondary | ICD-10-CM | POA: Diagnosis not present

## 2024-01-10 DIAGNOSIS — Z6830 Body mass index (BMI) 30.0-30.9, adult: Secondary | ICD-10-CM | POA: Diagnosis not present

## 2024-01-10 DIAGNOSIS — Z8249 Family history of ischemic heart disease and other diseases of the circulatory system: Secondary | ICD-10-CM | POA: Diagnosis not present

## 2024-01-10 DIAGNOSIS — I495 Sick sinus syndrome: Secondary | ICD-10-CM | POA: Diagnosis present

## 2024-01-10 DIAGNOSIS — Z833 Family history of diabetes mellitus: Secondary | ICD-10-CM | POA: Diagnosis not present

## 2024-01-10 DIAGNOSIS — E782 Mixed hyperlipidemia: Secondary | ICD-10-CM | POA: Diagnosis present

## 2024-01-10 DIAGNOSIS — Z515 Encounter for palliative care: Secondary | ICD-10-CM | POA: Diagnosis not present

## 2024-01-10 DIAGNOSIS — I4891 Unspecified atrial fibrillation: Secondary | ICD-10-CM | POA: Diagnosis not present

## 2024-01-10 DIAGNOSIS — G9341 Metabolic encephalopathy: Secondary | ICD-10-CM | POA: Diagnosis present

## 2024-01-10 DIAGNOSIS — Y92003 Bedroom of unspecified non-institutional (private) residence as the place of occurrence of the external cause: Secondary | ICD-10-CM | POA: Diagnosis not present

## 2024-01-10 DIAGNOSIS — Z950 Presence of cardiac pacemaker: Secondary | ICD-10-CM | POA: Diagnosis not present

## 2024-01-10 DIAGNOSIS — I4819 Other persistent atrial fibrillation: Secondary | ICD-10-CM | POA: Diagnosis present

## 2024-01-10 DIAGNOSIS — Z7901 Long term (current) use of anticoagulants: Secondary | ICD-10-CM | POA: Diagnosis not present

## 2024-01-10 DIAGNOSIS — I422 Other hypertrophic cardiomyopathy: Secondary | ICD-10-CM | POA: Diagnosis not present

## 2024-01-10 DIAGNOSIS — D72829 Elevated white blood cell count, unspecified: Secondary | ICD-10-CM | POA: Insufficient documentation

## 2024-01-10 DIAGNOSIS — F05 Delirium due to known physiological condition: Secondary | ICD-10-CM | POA: Diagnosis present

## 2024-01-10 DIAGNOSIS — Z79890 Hormone replacement therapy (postmenopausal): Secondary | ICD-10-CM | POA: Diagnosis not present

## 2024-01-10 LAB — POTASSIUM: Potassium: 3.8 mmol/L (ref 3.5–5.1)

## 2024-01-10 LAB — MRSA NEXT GEN BY PCR, NASAL: MRSA by PCR Next Gen: NOT DETECTED

## 2024-01-10 LAB — MAGNESIUM: Magnesium: 1.9 mg/dL (ref 1.7–2.4)

## 2024-01-10 MED ORDER — DILTIAZEM HCL 30 MG PO TABS
30.0000 mg | ORAL_TABLET | Freq: Three times a day (TID) | ORAL | Status: DC
  Administered 2024-01-10 (×2): 30 mg via ORAL
  Filled 2024-01-10 (×3): qty 1

## 2024-01-10 MED ORDER — APIXABAN 5 MG PO TABS
5.0000 mg | ORAL_TABLET | Freq: Two times a day (BID) | ORAL | Status: DC
  Administered 2024-01-10 – 2024-01-15 (×11): 5 mg via ORAL
  Filled 2024-01-10 (×11): qty 1

## 2024-01-10 MED ORDER — CARMEX CLASSIC LIP BALM EX OINT: TOPICAL_OINTMENT | CUTANEOUS | Status: DC | PRN

## 2024-01-10 MED ORDER — SODIUM CHLORIDE 0.9 % IV SOLN: INTRAVENOUS | Status: AC

## 2024-01-10 MED ORDER — SENNOSIDES 8.8 MG/5ML PO SYRP
5.0000 mL | ORAL_SOLUTION | Freq: Two times a day (BID) | ORAL | Status: DC
Start: 2024-01-10 — End: 2024-01-15
  Administered 2024-01-10 – 2024-01-14 (×8): 5 mL via ORAL
  Filled 2024-01-10 (×13): qty 5

## 2024-01-10 MED ORDER — POTASSIUM CHLORIDE CRYS ER 20 MEQ PO TBCR
40.0000 meq | EXTENDED_RELEASE_TABLET | Freq: Once | ORAL | Status: AC
  Administered 2024-01-10: 40 meq via ORAL
  Filled 2024-01-10: qty 2

## 2024-01-10 MED ORDER — ESCITALOPRAM OXALATE 10 MG PO TABS
20.0000 mg | ORAL_TABLET | Freq: Every day | ORAL | Status: DC
  Administered 2024-01-10 – 2024-01-15 (×6): 20 mg via ORAL
  Filled 2024-01-10 (×6): qty 2

## 2024-01-10 MED ORDER — POLYETHYLENE GLYCOL 3350 17 G PO PACK
17.0000 g | PACK | Freq: Every day | ORAL | Status: DC
  Administered 2024-01-10 – 2024-01-15 (×6): 17 g via ORAL
  Filled 2024-01-10 (×5): qty 1

## 2024-01-10 MED ORDER — DIGOXIN 125 MCG PO TABS
125.0000 ug | ORAL_TABLET | Freq: Every day | ORAL | Status: DC
  Administered 2024-01-10 – 2024-01-15 (×6): 125 ug via ORAL
  Filled 2024-01-10 (×6): qty 1

## 2024-01-10 MED ORDER — METOPROLOL TARTRATE 50 MG PO TABS
150.0000 mg | ORAL_TABLET | Freq: Two times a day (BID) | ORAL | Status: DC
  Administered 2024-01-10 (×2): 150 mg via ORAL
  Filled 2024-01-10 (×2): qty 3

## 2024-01-10 MED ORDER — CHLORHEXIDINE GLUCONATE CLOTH 2 % EX PADS
6.0000 | MEDICATED_PAD | Freq: Every day | CUTANEOUS | Status: DC
  Administered 2024-01-10 – 2024-01-13 (×4): 6 via TOPICAL

## 2024-01-10 MED ORDER — CEFAZOLIN SODIUM-DEXTROSE 2-4 GM/100ML-% IV SOLN
2.0000 g | Freq: Three times a day (TID) | INTRAVENOUS | Status: DC
  Administered 2024-01-10 – 2024-01-13 (×10): 2 g via INTRAVENOUS
  Filled 2024-01-10 (×9): qty 100

## 2024-01-10 MED ORDER — BUPROPION HCL ER (XL) 150 MG PO TB24
150.0000 mg | ORAL_TABLET | Freq: Every day | ORAL | Status: DC
  Administered 2024-01-10 – 2024-01-15 (×6): 150 mg via ORAL
  Filled 2024-01-10 (×5): qty 1

## 2024-01-10 MED ORDER — ORAL CARE MOUTH RINSE: 15.0000 mL | OROMUCOSAL | Status: DC | PRN

## 2024-01-10 MED ORDER — ACETAMINOPHEN 325 MG PO TABS
650.0000 mg | ORAL_TABLET | Freq: Four times a day (QID) | ORAL | Status: DC | PRN
  Administered 2024-01-10: 650 mg via ORAL
  Filled 2024-01-10: qty 2

## 2024-01-10 MED ORDER — NYSTATIN 100000 UNIT/GM EX POWD
Freq: Two times a day (BID) | CUTANEOUS | Status: DC | PRN
  Administered 2024-01-15: 1 via TOPICAL
  Filled 2024-01-10 (×2): qty 15

## 2024-01-10 MED ORDER — MELATONIN 3 MG PO TABS
6.0000 mg | ORAL_TABLET | Freq: Every evening | ORAL | Status: DC | PRN
  Administered 2024-01-11 – 2024-01-14 (×3): 6 mg via ORAL
  Filled 2024-01-10 (×4): qty 2

## 2024-01-10 MED ORDER — PANTOPRAZOLE SODIUM 40 MG PO TBEC
40.0000 mg | DELAYED_RELEASE_TABLET | Freq: Every day | ORAL | Status: DC
  Administered 2024-01-10 – 2024-01-14 (×5): 40 mg via ORAL
  Filled 2024-01-10 (×5): qty 1

## 2024-01-10 MED ORDER — VITAMIN D 25 MCG (1000 UNIT) PO TABS
2000.0000 [IU] | ORAL_TABLET | Freq: Every day | ORAL | Status: DC
  Administered 2024-01-10 – 2024-01-15 (×6): 2000 [IU] via ORAL
  Filled 2024-01-10 (×6): qty 2

## 2024-01-10 MED ORDER — ATORVASTATIN CALCIUM 10 MG PO TABS
10.0000 mg | ORAL_TABLET | Freq: Every day | ORAL | Status: DC
  Administered 2024-01-10 – 2024-01-15 (×6): 10 mg via ORAL
  Filled 2024-01-10 (×6): qty 1

## 2024-01-10 MED ORDER — SODIUM CHLORIDE 0.9 % IV SOLN: INTRAVENOUS | Status: DC

## 2024-01-10 MED ORDER — LEVOTHYROXINE SODIUM 88 MCG PO TABS
88.0000 ug | ORAL_TABLET | Freq: Every day | ORAL | Status: DC
  Administered 2024-01-10 – 2024-01-15 (×5): 88 ug via ORAL
  Filled 2024-01-10 (×6): qty 1

## 2024-01-10 NOTE — Plan of Care (Signed)
  Problem: Pain Managment: Goal: General experience of comfort will improve and/or be controlled Outcome: Progressing

## 2024-01-10 NOTE — Progress Notes (Signed)
  Transition of Care [facility]) Screening Note   Patient Details  Name: Denise Pugh Date of Birth: 06-16-1928   Transition of Care [facility]) CM/SW Contact:    Hoy DELENA Bigness, LCSW Phone Number: 01/10/2024, 1:31 PM    Transition of Care Department [facility]) has reviewed patient and no TOC needs have been identified at this time. We will continue to monitor patient advancement through interdisciplinary progression rounds. If new patient transition needs arise, please place a TOC consult.    01/10/24 1330  TOC Brief Assessment  Insurance and Status Reviewed  Patient has primary care physician Yes  Home environment has been reviewed Home alone  Prior level of function: Independent/modified independent  Prior/Current Home Services No current home services  Social Drivers of Health Review SDOH reviewed no interventions necessary  Readmission risk has been reviewed Yes  Transition of care needs transition of care needs identified, TOC will continue to follow

## 2024-01-10 NOTE — Consult Note (Addendum)
 Palliative Care Consult Note                                  Date: 01/10/2024   Patient Name: Denise Pugh  DOB:January 08, 1929  FMW:[id number]  Age / Sex:89 y.o., female  PCP: Denise Norleen PEDLAR, MD Referring Physician: Willette Adriana LABOR, MD  Reason for Consultation: Establishing goals of care  Past Medical History:  Diagnosis Date   Arrhythmia    HX of SVT. CARDIONET MONITOR 07/15/12 TO 08/13/12   Arthritis    Family history of adverse reaction to anesthesia 1970s   mother   GERD (gastroesophageal reflux disease)    Hyperlipidemia 08/06/11   lexiscan myoview-normal. Due to severe attenuation the study specificity and sensitivty are deminished.   Hypertension 08/25/10   ECHO-EF 60-65%   Hypothyroidism    Irregular heart beat    Shortness of breath    Sleep apnea    SLEEP STUDY-[facility]   Thyroid  disease      Assessment & Plan:   HPI/Patient Profile: 89 y.o. female  with past medical history of p-afib s/p pacemaker, hypothyroidism admitted on 01/09/2024 after a fall at home. Patient unable to recall when and how she fell but was found by family and brought in by EMS. Patient noted to be in A-fib with RVR which may have contributed to her fall. Palliative medicine consulted to discuss goals of care and code status.   Work up in ED showed leukocytosis, hypokalemia. Leukocytosis thought ot be from hemoconcentration due to dehydration. Hypokalemia of 3.0 was replenished. Radiograph of wrist showed soft tissue swelling. Radiograph of pelvis no displaced fracture or dislocation. Radiograph of right knee showed some joint effusion 2/2 to fall and degenerative changes of the knee. CXR and CT head showed no acute abnormalities. Cardizem  drip started for a-fib with RVR.   Met with patient and daughter Denise Pugh) and discussed goals of care and code status. Patient and daughter are both understanding that he patient may no longer  be able to live as independently as she has been and are in the process of setting up home health aides to help with ADLs. Daughter and nephew expressed concern for the patient prior to admission for her continued desire to live independently given her advanced age and perceived cognitive and physical decline. Patient is understanding of her family's concern for her wellbeing given her overall deterioration.   SUMMARY OF RECOMMENDATIONS   DNR - limited Continue current management, treat the treatable Patient expressed desire to pursue rehab  Symptom Management:  Per primary team  Code Status: DNR - Limited (DNR/DNI)  Prognosis:  Unable to determine  Discharge Planning:  To Be Determined   Discussed with: Twana MD, Marvis NP, [place] RN  Subjective:   Reviewed medical records, received report from team, assessed the patient and then meet at the patient's bedside to discuss diagnosis, prognosis, GOC, EOL wishes disposition and options.  Before meeting with the patient/family, I spent time reviewing the chart notes including H&P and TOC notes. I also reviewed vital signs, nursing flowsheets, medication administrations record, labs, and imaging.  I met with patient and daughter Denise Pugh).   We meet to discuss diagnosis prognosis, GOC, EOL wishes, disposition and options. Concept of Palliative Care was introduced as specialized medical care for people and their families living with serious illness.  If focuses on providing relief from the symptoms and stress of  a serious illness.  The goal is to improve quality of life for both the patient and the family. Values and goals of care important to patient and family were attempted to be elicited.  Created space and opportunity for patient  and family to explore thoughts and feelings regarding current medical situation   Natural trajectory and current clinical status were discussed. Questions and concerns addressed. Patient encouraged to call  with questions or concerns.    Patient/Family Understanding of Illness: - Patient has a fair understanding of her illness and expresses that she knows she is cannot continue to live as independently as before - Daughter and nephew were concerned for her living conditions before current hospitalization and expressed worries that the patient would have different expectations and goals for her care given some memory impairment  Life Review: - Has one daughter and a nephew who help with some care, but patient continues to live fairly independently even in her advanced age - Widowed  Patient Values: - Deferred  Baseline Status: - Reports living independently at home, has meals on wheels and some assistance from daughter and nephew - Family reports concerns in her living conditions and some adult protective services concern, which was not explored during this conversation - Patient is hard of hearing  Today's Discussion: - Discussed code status and changed to DNR/DNI, daughter expressed some concern with the patient making that decision citing that she is concerned that the patient will change her mind given mild memory impairment - Reassured daughter that this is the right decision and that the patient was adamant about DNR/DNI status which was confirmed 3 times during the conversation, daughter was in agreement with the code status change - Goals of care for patient and family were in alignment that she would pursue some sort of rehab either at home or at a facility in order to try to return to living with some independence but knowing that going forward she is going to need more assistance with ADLs and already working on getting home aides set up - Patient mentioned no feeding tube going forward given that she had experience where a family member had a feeding tube  Goals: - Get back to baseline or as close as possible to try to continue living at home - Continue current management, okay with  returning to hospital for other interventions if needed  Review of Systems  All other systems reviewed and are negative.   Objective:   Primary Diagnoses: Present on Admission:  Paroxysmal atrial fibrillation with RVR (HCC)  Essential hypertension  Mixed hyperlipidemia  GERD (gastroesophageal reflux disease)   Vital Signs:  BP (!) 112/56   Pulse 64   Temp 98.9 F (37.2 C) (Axillary)   Resp 18   Ht 5' 3 (1.6 m)   Wt 77.1 kg   SpO2 100%   BMI 30.11 kg/m   Physical Exam HENT:     Head: Normocephalic.     Nose: Nose normal.  Eyes:     Extraocular Movements: Extraocular movements intact.  Pulmonary:     Effort: Pulmonary effort is normal.  Neurological:     Mental Status: She is alert. Mental status is at baseline.  Psychiatric:        Mood and Affect: Mood normal.     Palliative Assessment/Data: 60%     Thank you for allowing us  to participate in the care of JAZLENE BARES PMT will continue to support holistically.  Billing based on MDM: High  Problems Addressed: One or more chronic illnesses with severe exacerbation, progression, or side effects of treatment.  Amount and/or Complexity of Data: Category 1:Review of prior external note(s) from each unique source, Review of the result(s) of each unique test, and Assessment requiring an independent historian(s) and Category 3:Discussion of management or test interpretation with external physician/other qualified health care professional/appropriate source (not separately reported)  Risks: Decision not to resuscitate or to de-escalate care because of poor prognosis   Signed by: Fairy FORBES Shan DEVONNA Palliative Medicine Team  Team Phone # [phone] (Nights/Weekends)  01/10/2024, 12:00 PM

## 2024-01-10 NOTE — Hospital Course (Addendum)
 Denise Pugh is a 89 y.o. female with medical history significant of persistent atrial fibrillation, hypothyroidism, hyperlipidemia, GERD, s/p pacemaker, anxiety who presents to the emergency department via EMS due to fall sustained at home.  Patient did not remember when and how she fell, some of the history was obtained from daughter at bedside, apparently she fell on her knees and presents with swollen right knee. Family checked on her and find her on the bed with legs being supported by a chair, EMS was activated and on arrival of EMS team, she was helped to get up and had difficulty in being able to bear weight due to right knee pain and swelling, she was also noted to be in A-fib with RVR with rates ranging within 150 to 160 BPM.   ED Course:  she was tachypneic, tachycardic and BP was 178/142.  Temperature 65F and O2 sat was 98% on room air.  Workup in the ED showed WBC 12.7, hemoglobin 17.7, hematocrit 50.5, MCV 94.6, platelets 179.  BMP sodium 131, potassium 3.0, chloride 98, bicarb 28, blood glucose 114, BUN 13, creatinine 0.67, ALP 146, total bilirubin 3.1, digoxin  < 0.6, magnesium 2.0, total CK 79 Right wrist x-ray showed mild soft tissue swelling of the wrist with no acute fracture or dislocation X-ray of pelvis showed no acute displaced fracture or dislocation of the pelvis or hips Right knee x-ray was suspicious for joint effusion and show severe tricompartmental degenerative changes of the knee Chest x-ray showed no acute cardiopulmonary disease. Patient was started on IV Cardizem  drip due to A-fib with RVR.  Vancomycin  was given due to suspicion for right wrist cellulitis.  Potassium was replenished.   Assessment & Plan:   Principal Problem:   Paroxysmal atrial fibrillation with RVR (HCC) Active Problems:   Essential hypertension   GERD (gastroesophageal reflux disease)   Mixed hyperlipidemia   Cellulitis of right hand   Hypokalemia   Hyponatremia    Leukocytosis   Paroxysmal atrial fibrillation with RVR HR: 63-1 17, currently 105, -IV Cardizem  to p.o., 60 mg every 8 hours -On metoprolol  150 mg p.o. twice daily, -Continue digoxin  at 125 mcg p.o. daily - Continue Eliquis  -Continue telemetry -in the stepdown unit - Echocardiogram: - Cardiology consulted, appreciate close follow-up    Presumed cellulitis of the right hand He was treated with IV vancomycin  in the ED We shall continue with IV Ancef -Mild erythema edema, no open wounds Afebrile, improved leukocytosis   Hypokalemia K+ 3.0 3.8, 4.1,  - Was replaced with magnesium   Hyponatremia Na 131, baseline at 134>>> 125 Continue IV hydration -SIADH workup, adding sodium tablets    Essential hypertension -Continue to titrate Cardizem , on metoprolol , - Pressure improved   Mixed hyperlipidemia- Continue Lipitor   Metabolic encephalopathy  - Transient confusion, hospital sundowning - Trend closely, reorienting -   GERD- Cont PPI

## 2024-01-10 NOTE — Progress Notes (Addendum)
 PROGRESS NOTE    Patient: Denise Pugh                            PCP: Shona Norleen PEDLAR, MD                    DOB: 06-May-1928            DOA: 01/09/2024 FMW:[id number]             DOS: 01/10/2024, 12:14 PM   LOS: 0 days   Date of Service: The patient was seen and examined on 01/10/2024  Subjective:   The patient was seen and examined this morning. A-fib with RVR denies any chest pain still on Cardizem  drip Heart rate has improved -heart rate 59-1 66 Blood pressure 101/59 satting 97% on 2 L of oxygen  Brief Narrative:   Denise Pugh is a 89 y.o. female with medical history significant of persistent atrial fibrillation, hypothyroidism, hyperlipidemia, GERD, s/p pacemaker, anxiety who presents to the emergency department via EMS due to fall sustained at home.  Patient did not remember when and how she fell, some of the history was obtained from daughter at bedside, apparently she fell on her knees and presents with swollen right knee. Family checked on her and find her on the bed with legs being supported by a chair, EMS was activated and on arrival of EMS team, she was helped to get up and had difficulty in being able to bear weight due to right knee pain and swelling, she was also noted to be in A-fib with RVR with rates ranging within 150 to 160 BPM.   ED Course:  she was tachypneic, tachycardic and BP was 178/142.  Temperature 61F and O2 sat was 98% on room air.  Workup in the ED showed WBC 12.7, hemoglobin 17.7, hematocrit 50.5, MCV 94.6, platelets 179.  BMP sodium 131, potassium 3.0, chloride 98, bicarb 28, blood glucose 114, BUN 13, creatinine 0.67, ALP 146, total bilirubin 3.1, digoxin  < 0.6, magnesium 2.0, total CK 79 Right wrist x-ray showed mild soft tissue swelling of the wrist with no acute fracture or dislocation X-ray of pelvis showed no acute displaced fracture or dislocation of the pelvis or hips Right knee x-ray was suspicious for joint effusion and show severe  tricompartmental degenerative changes of the knee Chest x-ray showed no acute cardiopulmonary disease. Patient was started on IV Cardizem  drip due to A-fib with RVR.  Vancomycin  was given due to suspicion for right wrist cellulitis.  Potassium was replenished.   Assessment & Plan:   Principal Problem:   Paroxysmal atrial fibrillation with RVR (HCC) Active Problems:   Essential hypertension   GERD (gastroesophageal reflux disease)   Mixed hyperlipidemia   Cellulitis of right hand   Hypokalemia   Hyponatremia   Leukocytosis   Paroxysmal atrial fibrillation with RVR HR: 1 16-59 Continue IV Cardizem  drip -switching to p.o. Cardizem  30 mg 3 times daily Continue telemetry -in the stepdown unit -Resuming home medication of digoxin , metoprolol , added Cardizem  -Follow-up echo, appreciate cardiology input    Presumed cellulitis of the right hand He was treated with IV vancomycin  in the ED We shall continue with IV Ancef -Mild erythema edema, no open wounds   Hypokalemia K+ 3.0, this was replenished with magnesium   Hyponatremia Na 131, baseline at 134 Continue IV hydration   Leukocytosis Elevated hemoglobin/hematocrit This is possibly due to reactive process such as hemoconcentration  due to dehydration Continue gentle hydration   Essential hypertension Continue IV Cardizem  drip and transition to oral meds when patient is weaned off Cardizem  drip -Blood pressure soft, monitoring closely   Mixed hyperlipidemia Continue Lipitor   GERD Cont PPI       ----------------------------------------------------------------------------------------------------------------------------------------------- Nutritional status:  The patient's BMI is: Body mass index is 30.11 kg/m. I agree with the assessment and plan as outlined  ------------------------------------------------------------------------------------------------------------------------------------------------  DVT  prophylaxis:   apixaban  (ELIQUIS ) tablet 5 mg   Code Status:   Code Status: Limited: Do not attempt resuscitation (DNR) -DNR-LIMITED -Do Not Intubate/DNI   Family Communication: No family member present at bedside-  -Advance care planning has been discussed.   Admission status:   Status is: Observation The patient remains OBS appropriate and will d/c before 2 midnights.   Disposition: From  - home             Planning for discharge in 1-2 days   Procedures:   No admission procedures for hospital encounter.   Antimicrobials:  Anti-infectives (From admission, onward)    Start     Dose/Rate Route Frequency Ordered Stop   01/10/24 0715  ceFAZolin (ANCEF) IVPB 2g/100 mL premix        2 g 200 mL/hr over 30 Minutes Intravenous Every 8 hours 01/10/24 0627 01/17/24 0559   01/09/24 2300  vancomycin  (VANCOCIN ) IVPB 1000 mg/200 mL premix        1,000 mg 200 mL/hr over 60 Minutes Intravenous  Once 01/09/24 2257 01/10/24 0100        Medication:   apixaban   5 mg Oral BID   atorvastatin   10 mg Oral Daily   buPROPion  150 mg Oral Daily   Chlorhexidine  Gluconate Cloth  6 each Topical Daily   cholecalciferol   2,000 Units Oral Daily   digoxin   125 mcg Oral Daily   diltiazem   30 mg Oral Q8H   escitalopram   20 mg Oral Daily   levothyroxine   88 mcg Oral Daily   metoprolol  tartrate  150 mg Oral BID   pantoprazole   40 mg Oral Q1200    acetaminophen , lip balm, nystatin, mouth rinse   Objective:   Vitals:   01/10/24 1130 01/10/24 1145 01/10/24 1200 01/10/24 1201  BP: (!) 129/56  (!) 101/59 (!) 101/59  Pulse: 61 61 61 (!) 59  Resp: 14 (!) 22 16 (!) 21  Temp:    98.2 F (36.8 C)  TempSrc:    Oral  SpO2: 98% 98% 97% 97%  Weight:      Height:        Intake/Output Summary (Last 24 hours) at 01/10/2024 1214 Last data filed at 01/10/2024 1120 Gross per 24 hour  Intake 768.78 ml  Output --  Net 768.78 ml   Filed Weights   01/09/24 2115  Weight: 77.1 kg     Physical  examination:   General:  AAO x 3,  cooperative, no distress;   HEENT:  Normocephalic, PERRL, otherwise with in Normal limits   Neuro:  CNII-XII intact. , normal motor and sensation, reflexes intact   Lungs:   Clear to auscultation BL, Respirations unlabored,  No wheezes / crackles  Cardio:    S1/S2, RRR, No murmure, No Rubs or Gallops   Abdomen:  Soft, non-tender, bowel sounds active all four quadrants, no guarding or peritoneal signs.  Muscular  skeletal:  Limited exam -global generalized weaknesses - in bed, able to move all 4 extremities,   2+ pulses,  symmetric,  No pitting edema  Skin:  Dry, warm to touch, negative for any Rashes,  Wounds: Please see nursing documentation       ------------------------------------------------------------------------------------------------------------------------------------------    LABs:     Latest Ref Rng & Units 01/09/2024    9:40 PM 10/15/2022    1:47 PM 03/30/2018   12:53 PM  CBC  WBC 4.0 - 10.5 K/uL 12.7  9.1  5.4   Hemoglobin 12.0 - 15.0 g/dL 82.2  85.4  85.7   Hematocrit 36.0 - 46.0 % 52.5  43.5  42.2   Platelets 150 - 400 K/uL 179  190  191       Latest Ref Rng & Units 01/09/2024    9:40 PM 10/15/2022    1:47 PM 03/30/2018   12:53 PM  CMP  Glucose 70 - 99 mg/dL 885  860  67   BUN 8 - 23 mg/dL 13  15  13    Creatinine 0.44 - 1.00 mg/dL 9.32  9.07  9.01   Sodium 135 - 145 mmol/L 131  134  134   Potassium 3.5 - 5.1 mmol/L 3.0  3.3  3.8   Chloride 98 - 111 mmol/L 90  96  91   CO2 22 - 32 mmol/L 28  28  27    Calcium  8.9 - 10.3 mg/dL 9.4  8.9  8.8   Total Protein 6.5 - 8.1 g/dL 7.9     Total Bilirubin 0.0 - 1.2 mg/dL 3.1     Alkaline Phos 38 - 126 U/L 146     AST 15 - 41 U/L 30     ALT 0 - 44 U/L 18          Micro Results Recent Results (from the past 240 hours)  MRSA Next Gen by PCR, Nasal     Status: None   Collection Time: 01/10/24  1:09 AM   Specimen: Nasal Mucosa; Nasal Swab  Result Value Ref Range Status   MRSA  by PCR Next Gen NOT DETECTED NOT DETECTED Final    Comment: (NOTE) The GeneXpert MRSA Assay (FDA approved for NASAL specimens only), is one component of a comprehensive MRSA colonization surveillance program. It is not intended to diagnose MRSA infection nor to guide or monitor treatment for MRSA infections. Test performance is not FDA approved in patients less than 69 years old. Performed at [facility], [address], [place], [place] [address]     Radiology Reports DG Wrist Complete Right Result Date: 01/09/2024 EXAM: 3 or more VIEW(S) XRAY OF THE WRIST 01/09/2024 11:18:54 PM COMPARISON: X-rays 08/22/2022. CLINICAL HISTORY: Trauma, swelling. Patient from home alone brought in by [facility]. Complains of falling yesterday and falling on her knees. Right knee notably swollen. EMS states patient was lying in bed when they arrived. Her back was on the bed and her legs were supported by a chair. Family stated they got her up but patient states she got up on her own. Patient is confused, stated president is Zachary Neighbors. Patient not in pain, just uncomfortable. Patient in Afib in triage. EMS states she is on Eliquis . FINDINGS: BONES AND JOINTS: Degenerative changes of the first [facility] and STT joints. Chondrocalcinosis of the TFCC. Chronic scapholunate widening suggesting ligamentous injury. No acute fracture. No focal osseous lesion. No joint dislocation. SOFT TISSUES: Mild soft tissue swelling of the wrist. IMPRESSION: 1. No acute fracture or dislocation. Electronically signed by: Norman Gatlin MD 01/09/2024 11:28 PM EDT RP Workstation: [id number]   DG Hand Complete Right Result Date:  01/09/2024 EXAM: 3 OR MORE VIEW(S) XRAY OF THE HAND 01/09/2024 11:18:54 PM COMPARISON: None available. CLINICAL HISTORY: Trauma and swelling. Patient reports falling yesterday, resulting in right knee swelling. EMS found the patient in bed. Patient is confused, reports no pain but discomfort, and is in Afib, on Eliquis .  FINDINGS: BONES AND JOINTS: No acute fracture. No focal osseous lesion. No joint dislocation. Severe degenerative changes about the hand most pronounced at the interphalangeal joints. SOFT TISSUES: Soft tissue swelling about the index finger. Vascular calcifications. IMPRESSION: 1. Severe degenerative changes of the hand, most pronounced at the interphalangeal joints. 2. No acute osseous abnormality. Electronically signed by: Norman Gatlin MD 01/09/2024 11:27 PM EDT RP Workstation: [id number]   DG Chest 1 View Result Date: 01/09/2024 EXAM: 1 VIEW(S) XRAY OF THE CHEST 01/09/2024 10:31:00 PM COMPARISON: Chest x-ray 07/21/2016. CLINICAL HISTORY: 809823 Fall 190176. Family stated ; they got her up but pt states she got up on her own. Pt is confused stated president in [place]. Pt not in pain. Just uncomfortable. Pt in Afib in triage. FINDINGS: LUNGS AND PLEURA: Chronic coarsened interstitial markings. No overt pulmonary edema. No pleural effusion. No pneumothorax. HEART AND MEDIASTINUM: Unchanged cardiomediastinal silhouette. Cardiomegaly. Atherosclerotic plaque. Left chest wall daily pacemaker. BONES AND SOFT TISSUES: No acute osseous abnormality. IMPRESSION: 1. No acute cardiopulmonary disease. Electronically signed by: Morgane Naveau MD 01/09/2024 10:40 PM EDT RP Workstation: [id number]   DG Pelvis 1-2 Views Result Date: 01/09/2024 EXAM: 1 or 2 VIEW(S) XRAY OF THE PELVIS 01/09/2024 10:31:00 PM COMPARISON: None available. CLINICAL HISTORY: 809823 Fall 190176. Pt from home alone brought in my [facility]. Complains of falling yesterday and falling on her knees. R knee notably swollen. EMS states patient was lying in bed when they arrived. Her back was on the bed and her legs were supported by a chair. Family stated ; they got her up but pt states she got up on her own. Pt is confused stated president in [place]. Pt not in pain. Just uncomfortable. Pt in Afib in triage. EMS states she is on Eliquis .; Best  images obtainable due to pt condition FINDINGS: BONES AND JOINTS: Left sacroiliac joint sclerosis and partial syndesmosis. No diastasis of the pelvis. No acute displaced fracture of the bones of the pelvis. No acute displaced fracture or dislocation of either hips on frontal view. Degenerative changes in the visualized lower lumbar spine. SOFT TISSUES: The soft tissues are unremarkable. IMPRESSION: 1. No acute displaced fracture or dislocation of the pelvis or hips. 2. Left sacroiliac joint sclerosis and partial syndesmosis. Electronically signed by: Morgane Naveau MD 01/09/2024 10:37 PM EDT RP Workstation: [id number]   DG Knee Complete 4 Views Right Result Date: 01/09/2024 EXAM: 4 OR MORE VIEW(S) XRAY OF THE RIGHT KNEE 01/09/2024 10:31:00 PM COMPARISON: None available. CLINICAL HISTORY: Fall 190176. Pt from home alone brought in my [facility]. Complains of falling yesterday and falling on her knees. R knee notably swollen. EMS states patient was lying in bed when they arrived. Her back was on the bed and her legs were supported by a chair. Family stated ; they got her up but pt states she got up on her own. Pt is confused stated president in [place]. Pt not in pain. Just uncomfortable. Pt in Afib in triage. EMS states she is on Eliquis .; Best images obtainable due to pt condition FINDINGS: BONES AND JOINTS: No acute fracture. No focal osseous lesion. No joint dislocation. Question joint effusion with limited evaluation on this incomplete lateral view.  Severe tricompartmental degenerative changes of the knee. SOFT TISSUES: The soft tissues are unremarkable. IMPRESSION: 1. Question joint effusion with limited evaluation on this incomplete lateral view. 2. Severe tricompartmental degenerative changes of the knee. Electronically signed by: Morgane Naveau MD 01/09/2024 10:35 PM EDT RP Workstation: [id number]   DG Lumbar Spine Complete Result Date: 01/09/2024 EXAM: 4 VIEW(S) XRAY OF THE LUMBAR SPINE 01/09/2024  10:31:00 PM COMPARISON: MRI of the lumbar spine 06/19/2014. CLINICAL HISTORY: Trauma, fall, and atrial fibrillation. Patient complains of falling yesterday and falling on her knees, with notable right knee swelling. EMS found the patient lying in bed with her back on the bed and legs supported by a chair. Patient is confused, reports no pain but discomfort, and is in atrial fibrillation, reportedly on Eliquis . Images were best obtainable due to patient condition. FINDINGS: LUMBAR SPINE: BONES: No acute fracture. No aggressive appearing osseous lesion. Alignment is normal. DISCS AND DEGENERATIVE CHANGES: Multilevel advanced spondylosis, disc space height loss, and degenerative endplate changes. Multilevel facet arthropathy most advanced in the lower lumbar spine. SOFT TISSUES: No acute abnormality. IMPRESSION: 1. No acute osseous abnormality of the lumbar spine. 2. Multilevel advanced spondylosis . Electronically signed by: Norman Gatlin MD 01/09/2024 10:34 PM EDT RP Workstation: [id number]   CT Head Wo Contrast Result Date: 01/09/2024 EXAM: CT HEAD WITHOUT CONTRAST 01/09/2024 10:06:07 PM TECHNIQUE: CT of the head was performed without the administration of intravenous contrast. Automated exposure control, iterative reconstruction, and/or weight based adjustment of the mA/kV was utilized to reduce the radiation dose to as low as reasonably achievable. COMPARISON: Comparison with 03/24/216. CLINICAL HISTORY: Facial trauma, blunt. Fall. Patient reports falling yesterday and falling on her knees. Right knee is notably swollen. Patient was found lying in bed with legs supported by a chair. Patient is in atrial fibrillation (Afib) in triage and is on Eliquis . FINDINGS: BRAIN AND VENTRICLES: No acute hemorrhage. No evidence of acute infarct. No hydrocephalus. No extra-axial collection. No mass effect or midline shift. Nonspecific periventricular and subcortical white matter hypoattenuation, likely sequela of chronic  microvascular ischemic change. Mild age-related parenchymal volume loss. ORBITS: Bilateral lens replacement. SINUSES: No acute abnormality. SOFT TISSUES AND SKULL: No acute soft tissue abnormality. No skull fracture. IMPRESSION: 1. No acute intracranial abnormality. Electronically signed by: Norman Gatlin MD 01/09/2024 10:09 PM EDT RP Workstation: [id number]    SIGNED: Adriana DELENA Grams, MD, FHM. FAAFP. [facility] - [facility] Critical care time spent - 55 min.  In seeing, evaluating and examining the patient. Reviewing medical records, labs, drawn plan of care. [facility],  Pager (please use [email or web address] to page/ text) Please use Epic Secure Chat for non-urgent communication (7AM-7PM)  If 7PM-7AM, please contact night-coverage [email or web address], 01/10/2024, 12:14 PM

## 2024-01-10 NOTE — Plan of Care (Signed)
 Patient remains a&o x 2-3 during this shift. No other issues during this shift.    Problem: Clinical Measurements: Goal: Ability to maintain clinical measurements within normal limits will improve Outcome: Progressing Goal: Will remain free from infection Outcome: Progressing Goal: Diagnostic test results will improve Outcome: Progressing Goal: Respiratory complications will improve Outcome: Progressing Goal: Cardiovascular complication will be avoided Outcome: Progressing   Problem: Activity: Goal: Risk for activity intolerance will decrease Outcome: Progressing   Problem: Nutrition: Goal: Adequate nutrition will be maintained Outcome: Progressing   Problem: Coping: Goal: Level of anxiety will decrease Outcome: Progressing   Problem: Elimination: Goal: Will not experience complications related to urinary retention Outcome: Progressing

## 2024-01-11 DIAGNOSIS — I480 Paroxysmal atrial fibrillation: Secondary | ICD-10-CM | POA: Diagnosis not present

## 2024-01-11 DIAGNOSIS — G9341 Metabolic encephalopathy: Secondary | ICD-10-CM

## 2024-01-11 DIAGNOSIS — I4891 Unspecified atrial fibrillation: Secondary | ICD-10-CM | POA: Diagnosis not present

## 2024-01-11 DIAGNOSIS — Z950 Presence of cardiac pacemaker: Secondary | ICD-10-CM

## 2024-01-11 DIAGNOSIS — I422 Other hypertrophic cardiomyopathy: Secondary | ICD-10-CM | POA: Diagnosis not present

## 2024-01-11 LAB — COMPREHENSIVE METABOLIC PANEL WITH GFR
ALT: 9 U/L (ref 0–44)
AST: 25 U/L (ref 15–41)
Albumin: 2.9 g/dL — ABNORMAL LOW (ref 3.5–5.0)
Alkaline Phosphatase: 116 U/L (ref 38–126)
Anion gap: 11 (ref 5–15)
BUN: 22 mg/dL (ref 8–23)
CO2: 23 mmol/L (ref 22–32)
Calcium: 8.3 mg/dL — ABNORMAL LOW (ref 8.9–10.3)
Chloride: 91 mmol/L — ABNORMAL LOW (ref 98–111)
Creatinine, Ser: 0.78 mg/dL (ref 0.44–1.00)
GFR, Estimated: >60 mL/min (ref >60)
Glucose, Bld: 93 mg/dL (ref 70–99)
Potassium: 4.1 mmol/L (ref 3.5–5.1)
Sodium: 125 mmol/L — ABNORMAL LOW (ref 135–145)
Total Bilirubin: 2 mg/dL — ABNORMAL HIGH (ref 0.0–1.2)
Total Protein: 6.2 g/dL — ABNORMAL LOW (ref 6.5–8.1)

## 2024-01-11 LAB — CBC
HCT: 43.3 % (ref 36.0–46.0)
Hemoglobin: 14.6 g/dL (ref 12.0–15.0)
MCH: 30.6 pg (ref 26.0–34.0)
MCHC: 33.7 g/dL (ref 30.0–36.0)
MCV: 90.8 fL (ref 80.0–100.0)
Platelets: 167 K/uL (ref 150–400)
RBC: 4.77 MIL/uL (ref 3.87–5.11)
RDW: 14.2 % (ref 11.5–15.5)
WBC: 9.6 K/uL (ref 4.0–10.5)
nRBC: 0 % (ref 0.0–0.2)

## 2024-01-11 LAB — URINALYSIS, ROUTINE W REFLEX MICROSCOPIC
Bilirubin Urine: NEGATIVE
Glucose, UA: NEGATIVE mg/dL
Hgb urine dipstick: NEGATIVE
Ketones, ur: NEGATIVE mg/dL
Nitrite: NEGATIVE
Protein, ur: NEGATIVE mg/dL
Specific Gravity, Urine: 1.014 (ref 1.005–1.030)
pH: 6 (ref 5.0–8.0)

## 2024-01-11 MED ORDER — SODIUM CHLORIDE 0.9 % IV SOLN: INTRAVENOUS | Status: AC

## 2024-01-11 MED ORDER — METOPROLOL SUCCINATE ER 50 MG PO TB24
200.0000 mg | ORAL_TABLET | Freq: Every day | ORAL | Status: DC
  Administered 2024-01-11: 200 mg via ORAL
  Filled 2024-01-11: qty 4

## 2024-01-11 MED ORDER — DILTIAZEM HCL 60 MG PO TABS
60.0000 mg | ORAL_TABLET | Freq: Three times a day (TID) | ORAL | Status: DC
  Administered 2024-01-11 – 2024-01-12 (×3): 60 mg via ORAL
  Filled 2024-01-11 (×3): qty 1

## 2024-01-11 MED ORDER — METOPROLOL TARTRATE 50 MG PO TABS
150.0000 mg | ORAL_TABLET | Freq: Two times a day (BID) | ORAL | Status: DC
  Administered 2024-01-11 – 2024-01-15 (×8): 150 mg via ORAL
  Filled 2024-01-11 (×9): qty 3

## 2024-01-11 MED ORDER — SODIUM CHLORIDE 1 G PO TABS
1.0000 g | ORAL_TABLET | Freq: Two times a day (BID) | ORAL | Status: DC
  Administered 2024-01-11 – 2024-01-15 (×8): 1 g via ORAL
  Filled 2024-01-11 (×8): qty 1

## 2024-01-11 MED ORDER — HALOPERIDOL LACTATE 5 MG/ML IJ SOLN
2.0000 mg | Freq: Once | INTRAMUSCULAR | Status: DC
Start: 2024-01-11 — End: 2024-01-15

## 2024-01-11 MED ORDER — DILTIAZEM HCL ER COATED BEADS 120 MG PO CP24
120.0000 mg | ORAL_CAPSULE | Freq: Every day | ORAL | Status: DC
  Administered 2024-01-11: 120 mg via ORAL
  Filled 2024-01-11: qty 1

## 2024-01-11 NOTE — Progress Notes (Signed)
 PROGRESS NOTE    Patient: Denise Pugh                            PCP: Shona Norleen PEDLAR, MD                    DOB: [birth year]            DOA: 01/09/2024 FMW:[id number]             DOS: 01/11/2024, 12:13 PM   LOS: 1 day   Date of Service: The patient was seen and examined on 01/11/2024  Subjective:   The patient was seen and examined this morning, awake alert oriented no acute distress, still in A-fib, with a heart rate of 105, RR 22, blood pressure 182/91 satting 88% on room air Issues overnight   Brief Narrative:   DAIJA ROUTSON is a 89 y.o. female with medical history significant of persistent atrial fibrillation, hypothyroidism, hyperlipidemia, GERD, s/p pacemaker, anxiety who presents to the emergency department via EMS due to fall sustained at home.  Patient did not remember when and how she fell, some of the history was obtained from daughter at bedside, apparently she fell on her knees and presents with swollen right knee. Family checked on her and find her on the bed with legs being supported by a chair, EMS was activated and on arrival of EMS team, she was helped to get up and had difficulty in being able to bear weight due to right knee pain and swelling, she was also noted to be in A-fib with RVR with rates ranging within 150 to 160 BPM.   ED Course:  she was tachypneic, tachycardic and BP was 178/142.  Temperature 41F and O2 sat was 98% on room air.  Workup in the ED showed WBC 12.7, hemoglobin 17.7, hematocrit 50.5, MCV 94.6, platelets 179.  BMP sodium 131, potassium 3.0, chloride 98, bicarb 28, blood glucose 114, BUN 13, creatinine 0.67, ALP 146, total bilirubin 3.1, digoxin  < 0.6, magnesium 2.0, total CK 79 Right wrist x-ray showed mild soft tissue swelling of the wrist with no acute fracture or dislocation X-ray of pelvis showed no acute displaced fracture or dislocation of the pelvis or hips Right knee x-ray was suspicious for joint effusion and show severe tricompartmental  degenerative changes of the knee Chest x-ray showed no acute cardiopulmonary disease. Patient was started on IV Cardizem  drip due to A-fib with RVR.  Vancomycin  was given due to suspicion for right wrist cellulitis.  Potassium was replenished.   Assessment & Plan:   Principal Problem:   Paroxysmal atrial fibrillation with RVR (HCC) Active Problems:   Essential hypertension   GERD (gastroesophageal reflux disease)   Mixed hyperlipidemia   Cellulitis of right hand   Hypokalemia   Hyponatremia   Leukocytosis   Paroxysmal atrial fibrillation with RVR HR: 63-1 17, currently 105, -IV Cardizem  to p.o., 60 mg every 8 hours -On metoprolol  150 mg p.o. twice daily, -Continue digoxin  at 125 mcg p.o. daily - Continue Eliquis  -Continue telemetry -in the stepdown unit - Echocardiogram: - Cardiology consulted, appreciate close follow-up    Presumed cellulitis of the right hand He was treated with IV vancomycin  in the ED We shall continue with IV Ancef -Mild erythema edema, no open wounds Afebrile, improved leukocytosis   Hypokalemia K+ 3.0 3.8, 4.1,  - Was replaced with magnesium   Hyponatremia Na 131, baseline at 134>>> 125 Continue IV  hydration -SIADH workup, adding sodium tablets    Essential hypertension -Continue to titrate Cardizem , on metoprolol , - Pressure improved   Mixed hyperlipidemia- Continue Lipitor   Metabolic encephalopathy  - Transient confusion, hospital sundowning - Trend closely, reorienting -   GERD- Cont PPI       ----------------------------------------------------------------------------------------------------------------------------------------------- Nutritional status:  The patient's BMI is: Body mass index is 30.11 kg/m. I agree with the assessment and plan as outlined  ------------------------------------------------------------------------------------------------------------------------------------------------  DVT prophylaxis:    apixaban  (ELIQUIS ) tablet 5 mg   Code Status:   Code Status: Limited: Do not attempt resuscitation (DNR) -DNR-LIMITED -Do Not Intubate/DNI   Family Communication: No family member present at bedside-  -Advance care planning has been discussed.   Admission status:   Status is: Observation The patient remains OBS appropriate and will d/c before 2 midnights.   Disposition: From  - home             Planning for discharge in 1-2 days   Procedures:   No admission procedures for hospital encounter.   Antimicrobials:  Anti-infectives (From admission, onward)    Start     Dose/Rate Route Frequency Ordered Stop   01/10/24 0715  ceFAZolin (ANCEF) IVPB 2g/100 mL premix        2 g 200 mL/hr over 30 Minutes Intravenous Every 8 hours 01/10/24 0627 01/17/24 0559   01/09/24 2300  vancomycin  (VANCOCIN ) IVPB 1000 mg/200 mL premix        1,000 mg 200 mL/hr over 60 Minutes Intravenous  Once 01/09/24 2257 01/10/24 0100        Medication:   apixaban   5 mg Oral BID   atorvastatin   10 mg Oral Daily   buPROPion  150 mg Oral Daily   Chlorhexidine  Gluconate Cloth  6 each Topical Daily   cholecalciferol   2,000 Units Oral Daily   digoxin   125 mcg Oral Daily   diltiazem   60 mg Oral Q8H   escitalopram   20 mg Oral Daily   haloperidol lactate  2 mg Intramuscular Once   levothyroxine   88 mcg Oral Daily   metoprolol  tartrate  150 mg Oral BID   pantoprazole   40 mg Oral Q1200   polyethylene glycol  17 g Oral Daily   sennosides  5 mL Oral BID   sodium chloride   1 g Oral BID WC    acetaminophen , lip balm, melatonin, nystatin, mouth rinse   Objective:   Vitals:   01/11/24 0900 01/11/24 1000 01/11/24 1019 01/11/24 1102  BP: (!) 148/77 (!) 142/91    Pulse:  (!) 117 (!) 105   Resp:      Temp:    98.2 F (36.8 C)  TempSrc:    Oral  SpO2:  (!) 88%    Weight:      Height:        Intake/Output Summary (Last 24 hours) at 01/11/2024 1213 Last data filed at 01/11/2024 0505 Gross per 24 hour   Intake 632.32 ml  Output --  Net 632.32 ml   Filed Weights   01/09/24 2115  Weight: 77.1 kg     Physical examination:       General:  AAO x 3,  cooperative, no distress;   HEENT:  Normocephalic, PERRL, otherwise with in Normal limits   Neuro:  CNII-XII intact. , normal motor and sensation, reflexes intact   Lungs:   Clear to auscultation BL, Respirations unlabored,  No wheezes / crackles  Cardio:    S1/S2, irregularly irregular, no murmure, No  Rubs or Gallops   Abdomen:  Soft, non-tender, bowel sounds active all four quadrants, no guarding or peritoneal signs.  Muscular  skeletal:  Limited exam -global generalized weaknesses - in bed, able to move all 4 extremities,   2+ pulses,  symmetric, No pitting edema  Skin:  Dry, warm to touch, negative for any Rashes,  Wounds: Please see nursing documentation          ------------------------------------------------------------------------------------------------------------------------------------------    LABs:     Latest Ref Rng & Units 01/11/2024    4:42 AM 01/09/2024    9:40 PM 10/15/2022    1:47 PM  CBC  WBC 4.0 - 10.5 K/uL 9.6  12.7  9.1   Hemoglobin 12.0 - 15.0 g/dL 85.3  82.2  85.4   Hematocrit 36.0 - 46.0 % 43.3  52.5  43.5   Platelets 150 - 400 K/uL 167  179  190       Latest Ref Rng & Units 01/11/2024    4:42 AM 01/10/2024    5:24 PM 01/09/2024    9:40 PM  CMP  Glucose 70 - 99 mg/dL 93   885   BUN 8 - 23 mg/dL 22   13   Creatinine 9.55 - 1.00 mg/dL 9.21   9.32   Sodium 864 - 145 mmol/L 125   131   Potassium 3.5 - 5.1 mmol/L 4.1  3.8  3.0   Chloride 98 - 111 mmol/L 91   90   CO2 22 - 32 mmol/L 23   28   Calcium  8.9 - 10.3 mg/dL 8.3   9.4   Total Protein 6.5 - 8.1 g/dL 6.2   7.9   Total Bilirubin 0.0 - 1.2 mg/dL 2.0   3.1   Alkaline Phos 38 - 126 U/L 116   146   AST 15 - 41 U/L 25   30   ALT 0 - 44 U/L 9   18        Micro Results Recent Results (from the past 240 hours)  MRSA Next Gen by  PCR, Nasal     Status: None   Collection Time: 01/10/24  1:09 AM   Specimen: Nasal Mucosa; Nasal Swab  Result Value Ref Range Status   MRSA by PCR Next Gen NOT DETECTED NOT DETECTED Final    Comment: (NOTE) The GeneXpert MRSA Assay (FDA approved for NASAL specimens only), is one component of a comprehensive MRSA colonization surveillance program. It is not intended to diagnose MRSA infection nor to guide or monitor treatment for MRSA infections. Test performance is not FDA approved in patients less than 76 years old. Performed at [facility], [address] [place], [place] [address]     Radiology Reports No results found.   SIGNED: Adriana DELENA Grams, MD, FHM. FAAFP. [facility] - [facility] Critical care time spent - 55 min.  In seeing, evaluating and examining the patient. Reviewing medical records, labs, drawn plan of care. [facility],  Pager (please use [email or web address] to page/ text) Please use Epic Secure Chat for non-urgent communication (7AM-7PM)  If 7PM-7AM, please contact night-coverage [email or web address], 01/11/2024, 12:13 PM

## 2024-01-11 NOTE — Plan of Care (Signed)
  Problem: Clinical Measurements: Goal: Respiratory complications will improve Outcome: Progressing Goal: Cardiovascular complication will be avoided Outcome: Progressing   Problem: Activity: Goal: Risk for activity intolerance will decrease Outcome: Not Progressing   

## 2024-01-11 NOTE — Plan of Care (Signed)
   Problem: Education: Goal: Knowledge of General Education information will improve Description: Including pain rating scale, medication(s)/side effects and non-pharmacologic comfort measures Outcome: Progressing   Problem: Activity: Goal: Risk for activity intolerance will decrease Outcome: Progressing   Problem: Nutrition: Goal: Adequate nutrition will be maintained Outcome: Progressing   Problem: Coping: Goal: Level of anxiety will decrease Outcome: Progressing

## 2024-01-11 NOTE — Progress Notes (Addendum)
 Patient becoming combative and more confused this morning. MD informed. Called daughter and informed about this. Did not pee all night. Bladder scan performed=244ml.Relayed to MD. Order in for bladder scan but pt refusing at this time.

## 2024-01-11 NOTE — Consult Note (Signed)
 CARDIOLOGY CONSULT NOTE    Patient ID: Denise Pugh; [id number]; [birth year]   Admit date: 01/09/2024 Date of Consult: 01/11/2024  Primary Care Provider: Shona Norleen PEDLAR, MD Primary Cardiologist:  Primary Electrophysiologist:     History of Present Illness:   Ms. Ballester is a 89 y/o F known to have tachycardia-bradycardia syndrome s/p dual-chamber PPM, apical variant HCM (with no HF/VT), HTN, OSA presented to the ER s/p fall.  Patient is confused.  AO x 0.  EKG upon arrival showed A-fib with RVR, HR 120s.  Cardiology is consulted for the management of A-fib with RVR.  She has been agitated and combative overnight.  She was also dehydrated as evident on her labs from admission that improved significantly today.  She is currently on IV fluids.  She is also on empiric antibiotics, IV cefazolin.  Past Medical History:  Diagnosis Date   Arrhythmia    HX of SVT. CARDIONET MONITOR 07/15/12 TO 08/13/12   Arthritis    Family history of adverse reaction to anesthesia 1970s   mother   GERD (gastroesophageal reflux disease)    Hyperlipidemia 08/06/11   lexiscan myoview-normal. Due to severe attenuation the study specificity and sensitivty are deminished.   Hypertension 08/25/10   ECHO-EF 60-65%   Hypothyroidism    Irregular heart beat    Shortness of breath    Sleep apnea    SLEEP STUDY-[facility]   Thyroid  disease     Past Surgical History:  Procedure Laterality Date   CARDIAC CATHETERIZATION  07/06/01   spade-like configuration   CARDIOVERSION N/A 06/07/2017   Procedure: CARDIOVERSION;  Surgeon: Francyne Headland, MD;  Location: [facility] ENDOS[facility];  Service: Cardiovascular;  Laterality: N/A;   CARDIOVERSION N/A 04/14/2018   Procedure: CARDIOVERSION;  Surgeon: Francyne Headland, MD;  Location: [facility] ENDOSCOPY;  Service: Cardiovascular;  Laterality: N/A;   DILATION AND CURETTAGE OF UTERUS     EYE SURGERY     KNEE ARTHROSCOPY     PERMANENT PACEMAKER INSERTION N/A 07/20/2013    Procedure: PERMANENT PACEMAKER INSERTION;  Surgeon: Headland Francyne, MD;  Location: [facility] CATH LAB;  Service: Cardiovascular;  Laterality: N/A;   PPM GENERATOR CHANGEOUT N/A 10/15/2022   Procedure: PPM GENERATOR CHANGEOUT;  Surgeon: Francyne Headland, MD;  Location: [facility] INVASIVE CV LAB;  Service: Cardiovascular;  Laterality: N/A;       Inpatient Medications: Scheduled Meds:  apixaban   5 mg Oral BID   atorvastatin   10 mg Oral Daily   buPROPion  150 mg Oral Daily   Chlorhexidine  Gluconate Cloth  6 each Topical Daily   cholecalciferol   2,000 Units Oral Daily   digoxin   125 mcg Oral Daily   diltiazem   120 mg Oral Daily   escitalopram   20 mg Oral Daily   haloperidol lactate  2 mg Intramuscular Once   levothyroxine   88 mcg Oral Daily   metoprolol  succinate  200 mg Oral Daily   pantoprazole   40 mg Oral Q1200   polyethylene glycol  17 g Oral Daily   sennosides  5 mL Oral BID   sodium chloride   1 g Oral BID WC   Continuous Infusions:  sodium chloride  100 mL/hr at 01/11/24 0920    ceFAZolin (ANCEF) IV 2 g (01/11/24 0558)   PRN Meds: acetaminophen , lip balm, melatonin, nystatin, mouth rinse  Allergies:    Allergies  Allergen Reactions   Penicillins Other (See Comments)    Has patient had a PCN reaction causing immediate rash, facial/tongue/throat swelling, SOB or  lightheadedness with hypotension: Unknown Has patient had a PCN reaction causing severe rash involving mucus membranes or skin necrosis: Unknown Has patient had a PCN reaction that required hospitalization: Unknown Has patient had a PCN reaction occurring within the last 10 years: Unknown If all of the above answers are NO, then may proceed with Cephalosporin use.    Tetanus Toxoid Swelling    Social History:   Social History   Socioeconomic History   Marital status: Widowed    Spouse name: Not on file   Number of children: Not on file   Years of education: Not on file   Highest education level: Not on file  Occupational  History   Not on file  Tobacco Use   Smoking status: Never   Smokeless tobacco: Never  Vaping Use   Vaping status: Never Used  Substance and Sexual Activity   Alcohol use: No    Alcohol/week: 0.0 standard drinks of alcohol   Drug use: No   Sexual activity: Not Currently  Other Topics Concern   Not on file  Social History Narrative   Not on file   Social Drivers of Health   Financial Resource Strain: Not on file  Food Insecurity: No Food Insecurity (01/10/2024)   Hunger Vital Sign    Worried About Running Out of Food in the Last Year: Never true    Ran Out of Food in the Last Year: Never true  Transportation Needs: No Transportation Needs (01/10/2024)   PRAPARE - Administrator, Civil Service (Medical): No    Lack of Transportation (Non-Medical): No  Physical Activity: Not on file  Stress: Not on file  Social Connections: Socially Isolated (01/10/2024)   Social Connection and Isolation Panel    Frequency of Communication with Friends and Family: More than three times a week    Frequency of Social Gatherings with Friends and Family: Once a week    Attends Religious Services: Never    Database administrator or Organizations: No    Attends Banker Meetings: Never    Marital Status: Widowed  Intimate Partner Violence: Not At Risk (01/10/2024)   Humiliation, Afraid, Rape, and Kick questionnaire    Fear of Current or Ex-Partner: No    Emotionally Abused: No    Physically Abused: No    Sexually Abused: No    Family History:    Family History  Problem Relation Age of Onset   Diabetes Mother    Hypertension Father    Diabetes Father    Heart attack Maternal Grandmother    Heart attack Maternal Grandfather      ROS:  Please see the history of present illness.  ROS  All other ROS reviewed and negative.     Physical Exam/Data:   Vitals:   01/11/24 0900 01/11/24 1000 01/11/24 1019 01/11/24 1102  BP: (!) 148/77 (!) 142/91    Pulse:  (!) 117  (!) 105   Resp:      Temp:    98.2 F (36.8 C)  TempSrc:    Oral  SpO2:  (!) 88%    Weight:      Height:        Intake/Output Summary (Last 24 hours) at 01/11/2024 1131 Last data filed at 01/11/2024 0505 Gross per 24 hour  Intake 632.32 ml  Output --  Net 632.32 ml   Filed Weights   01/09/24 2115  Weight: 77.1 kg   Body mass index is 30.11 kg/m.  General:  Well nourished, well developed, in no acute distress HEENT: normal Lymph: no adenopathy Neck: no JVD Endocrine:  No thryomegaly Vascular: No carotid bruits; FA pulses 2+ bilaterally without bruits  Cardiac:  normal S1, S2; RRR; no murmur  Lungs:  clear to auscultation bilaterally, no wheezing, rhonchi or rales  Abd: soft, nontender, no hepatomegaly  Ext: no edema Musculoskeletal:  No deformities, BUE and BLE strength normal and equal Skin: warm and dry  Neuro:  CNs 2-12 intact, no focal abnormalities noted Psych: AO x 0  Laboratory Data:  Chemistry Recent Labs  Lab 01/09/24 2140 01/10/24 1724 01/11/24 0442  NA 131*  --  125*  K 3.0* 3.8 4.1  CL 90*  --  91*  CO2 28  --  23  GLUCOSE 114*  --  93  BUN 13  --  22  CREATININE 0.67  --  0.78  CALCIUM  9.4  --  8.3*  GFRNONAA >60  --  >60  ANIONGAP 13  --  11    Recent Labs  Lab 01/09/24 2140 01/11/24 0442  PROT 7.9 6.2*  ALBUMIN 3.8 2.9*  AST 30 25  ALT 18 9  ALKPHOS 146* 116  BILITOT 3.1* 2.0*   Hematology Recent Labs  Lab 01/09/24 2140 01/11/24 0442  WBC 12.7* 9.6  RBC 5.73* 4.77  HGB 17.7* 14.6  HCT 52.5* 43.3  MCV 91.6 90.8  MCH 30.9 30.6  MCHC 33.7 33.7  RDW 14.6 14.2  PLT 179 167   Cardiac EnzymesNo results for input(s): TROPONINI in the last 168 hours. No results for input(s): TROPIPOC in the last 168 hours.  BNPNo results for input(s): BNP, PROBNP in the last 168 hours.  DDimer No results for input(s): DDIMER in the last 168 hours.  Radiology/Studies:    Assessment and Plan:   A-fib with RVR - Permanent -  Multifactorial, in the setting of dehydration, agitation/delirium, SIRS/sepsis - EKG upon arrival to the ER showed A-fib with RVR, HR 120 bpm.  With IV fluids, HR now ranging between 80 and 100s on telemetry, in A-fib. -Continue home medications, p.o. digoxin  0.125 mg once daily, metoprolol  tartrate 150 mg twice daily (switch succinate to tartrate).  Diltiazem  started this admission, switch to short acting diltiazem  60 mg every 8 hours. - Continue Eliquis  5 mg twice daily.  Acute metabolic encephalopathy - Plan per primary team.  AO x 0 now.  Tachycardia-bradycardia syndrome s/p PPM - Follows up with Dr. Francyne.  Apical variant HCM - No HF/ventricular arrhythmia.  No LVOT obstruction by echo.  Monitor.   60 minutes spent in reviewing the prior records, notes, more than 3 labs, discussion/documentation.  For questions or updates, please contact [facility] Please consult [email or web address] for contact info under Cardiology/STEMI.   Signed, Indiana Pechacek Priya Bettymae Yott, MD 01/11/2024 11:31 AM

## 2024-01-12 DIAGNOSIS — I480 Paroxysmal atrial fibrillation: Secondary | ICD-10-CM | POA: Diagnosis not present

## 2024-01-12 DIAGNOSIS — Z515 Encounter for palliative care: Secondary | ICD-10-CM | POA: Diagnosis not present

## 2024-01-12 DIAGNOSIS — I4891 Unspecified atrial fibrillation: Secondary | ICD-10-CM | POA: Diagnosis not present

## 2024-01-12 DIAGNOSIS — Z7189 Other specified counseling: Secondary | ICD-10-CM | POA: Diagnosis not present

## 2024-01-12 DIAGNOSIS — M1990 Unspecified osteoarthritis, unspecified site: Secondary | ICD-10-CM

## 2024-01-12 DIAGNOSIS — Z789 Other specified health status: Secondary | ICD-10-CM

## 2024-01-12 LAB — CBC
HCT: 43.7 % (ref 36.0–46.0)
Hemoglobin: 14.6 g/dL (ref 12.0–15.0)
MCH: 31.1 pg (ref 26.0–34.0)
MCHC: 33.4 g/dL (ref 30.0–36.0)
MCV: 93 fL (ref 80.0–100.0)
Platelets: 182 K/uL (ref 150–400)
RBC: 4.7 MIL/uL (ref 3.87–5.11)
RDW: 14.2 % (ref 11.5–15.5)
WBC: 7.3 K/uL (ref 4.0–10.5)
nRBC: 0 % (ref 0.0–0.2)

## 2024-01-12 LAB — COMPREHENSIVE METABOLIC PANEL WITH GFR
ALT: 7 U/L (ref 0–44)
AST: 32 U/L (ref 15–41)
Albumin: 2.9 g/dL — ABNORMAL LOW (ref 3.5–5.0)
Alkaline Phosphatase: 115 U/L (ref 38–126)
Anion gap: 11 (ref 5–15)
BUN: 20 mg/dL (ref 8–23)
CO2: 24 mmol/L (ref 22–32)
Calcium: 8.4 mg/dL — ABNORMAL LOW (ref 8.9–10.3)
Chloride: 97 mmol/L — ABNORMAL LOW (ref 98–111)
Creatinine, Ser: 0.71 mg/dL (ref 0.44–1.00)
GFR, Estimated: >60 mL/min (ref >60)
Glucose, Bld: 88 mg/dL (ref 70–99)
Potassium: 3.8 mmol/L (ref 3.5–5.1)
Sodium: 131 mmol/L — ABNORMAL LOW (ref 135–145)
Total Bilirubin: 1.2 mg/dL (ref 0.0–1.2)
Total Protein: 6.1 g/dL — ABNORMAL LOW (ref 6.5–8.1)

## 2024-01-12 MED ORDER — ENSURE PLUS HIGH PROTEIN PO LIQD
237.0000 mL | Freq: Two times a day (BID) | ORAL | Status: DC
Start: 2024-01-12 — End: 2024-01-15
  Administered 2024-01-12 – 2024-01-14 (×4): 237 mL via ORAL

## 2024-01-12 MED ORDER — DILTIAZEM HCL ER COATED BEADS 120 MG PO CP24
120.0000 mg | ORAL_CAPSULE | Freq: Every day | ORAL | Status: DC
  Administered 2024-01-12 – 2024-01-15 (×4): 120 mg via ORAL
  Filled 2024-01-12 (×4): qty 1

## 2024-01-12 MED ORDER — SIMETHICONE 40 MG/0.6ML PO SUSP
40.0000 mg | Freq: Four times a day (QID) | ORAL | Status: DC | PRN
  Administered 2024-01-12: 40 mg via ORAL
  Filled 2024-01-12: qty 0.6

## 2024-01-12 MED ORDER — POTASSIUM CHLORIDE CRYS ER 10 MEQ PO TBCR
10.0000 meq | EXTENDED_RELEASE_TABLET | Freq: Once | ORAL | Status: AC
  Administered 2024-01-12: 10 meq via ORAL
  Filled 2024-01-12: qty 1

## 2024-01-12 MED ORDER — MAGNESIUM OXIDE -MG SUPPLEMENT 400 (240 MG) MG PO TABS
200.0000 mg | ORAL_TABLET | Freq: Every day | ORAL | Status: DC
  Administered 2024-01-12 – 2024-01-15 (×4): 200 mg via ORAL
  Filled 2024-01-12 (×4): qty 1

## 2024-01-12 NOTE — Progress Notes (Signed)
 PROGRESS NOTE    Patient: Dickey GORMAN Sharps                            PCP: Shona Norleen PEDLAR, MD                    DOB: 25-Apr-1928            DOA: 01/09/2024 FMW:[id number]             DOS: 01/12/2024, 10:36 AM   LOS: 2 days   Date of Service: The patient was seen and examined on 01/12/2024  Subjective:   The patient was seen and examined.  Awake alert oriented x 2, no acute distress, Heart rate remains stable 60s, blood pressure soft but stable More awake this morning, in  no acute distress   Brief Narrative:   CHARNETTE YOUNKIN is a 89 y.o. female with medical history significant of persistent atrial fibrillation, hypothyroidism, hyperlipidemia, GERD, s/p pacemaker, anxiety who presents to the emergency department via EMS due to fall sustained at home.  Patient did not remember when and how she fell, some of the history was obtained from daughter at bedside, apparently she fell on her knees and presents with swollen right knee. Family checked on her and find her on the bed with legs being supported by a chair, EMS was activated and on arrival of EMS team, she was helped to get up and had difficulty in being able to bear weight due to right knee pain and swelling, she was also noted to be in A-fib with RVR with rates ranging within 150 to 160 BPM.   ED Course:  she was tachypneic, tachycardic and BP was 178/142.  Temperature 22F and O2 sat was 98% on room air.  Workup in the ED showed WBC 12.7, hemoglobin 17.7, hematocrit 50.5, MCV 94.6, platelets 179.  BMP sodium 131, potassium 3.0, chloride 98, bicarb 28, blood glucose 114, BUN 13, creatinine 0.67, ALP 146, total bilirubin 3.1, digoxin  < 0.6, magnesium 2.0, total CK 79 Right wrist x-ray showed mild soft tissue swelling of the wrist with no acute fracture or dislocation X-ray of pelvis showed no acute displaced fracture or dislocation of the pelvis or hips Right knee x-ray was suspicious for joint effusion and show severe tricompartmental  degenerative changes of the knee Chest x-ray showed no acute cardiopulmonary disease. Patient was started on IV Cardizem  drip due to A-fib with RVR.  Vancomycin  was given due to suspicion for right wrist cellulitis.  Potassium was replenished.   Assessment & Plan:   Principal Problem:   Paroxysmal atrial fibrillation with RVR (HCC) Active Problems:   Essential hypertension   GERD (gastroesophageal reflux disease)   Mixed hyperlipidemia   Cellulitis of right hand   Hypokalemia   Hyponatremia   Leukocytosis   Paroxysmal atrial fibrillation with RVR - Heart rate 58-1 17, currently 63, mainly has been in the 60s overnight Blood pressure stable 119/57 currently, satting 94% on 2 L of oxygen  - Cardiology adjusted medications: - Metoprolol  tartarate 150 mg BID (switch from succinate),  - switch short acting diltiazem  to long acting diltiazem  120 mg once daily, -  Digoxin  0.125 mg once daily,  -  Eliquis  5 mg BID.     - 2019 Echocardiogram:Ejection fraction 60-65%, mild LVH grade 3 diastolic dysfunction - Cardiology signed off    Presumed cellulitis of the right hand Improved edema, erythema He was treated with  IV vancomycin  in the ED Was on IV Ancef till 12/2223    Hypokalemia K+ 3.0 3.8, 4.1, 3.8 - Monitoring potassium and magnesium level and replacing accordingly Keeping K > 4, Mag >2    Hyponatremia Na 131, baseline at 134>>> 125 Continue IV hydration -SIADH workup, adding sodium tablets    Essential hypertension -Continue to titrate Cardizem , on metoprolol , - Soft, but stable   Mixed hyperlipidemia- Continue Lipitor   Metabolic encephalopathy  - Transient confusion, hospital sundowning - Trend closely, reorienting -   GERD- Cont PPI   Debility: Generalized weakness with fluctuating confusion Consulting PT  OT,      ----------------------------------------------------------------------------------------------------------------------------------------------- Nutritional status:  The patient's BMI is: Body mass index is 30.11 kg/m. I agree with the assessment and plan as outlined  ------------------------------------------------------------------------------------------------------------------------------------------------  DVT prophylaxis:  Place and maintain sequential compression device Start: 01/11/24 1600 apixaban  (ELIQUIS ) tablet 5 mg   Code Status:   Code Status: Limited: Do not attempt resuscitation (DNR) -DNR-LIMITED -Do Not Intubate/DNI   Family Communication: No family member present at bedside-  -Advance care planning has been discussed.   Admission status:   Status is: Observation The patient remains OBS appropriate and will d/c before 2 midnights.   Disposition: From  - home             Planning for discharge in 1-2 days   Procedures:   No admission procedures for hospital encounter.   Antimicrobials:  Anti-infectives (From admission, onward)    Start     Dose/Rate Route Frequency Ordered Stop   01/10/24 0715  ceFAZolin (ANCEF) IVPB 2g/100 mL premix        2 g 200 mL/hr over 30 Minutes Intravenous Every 8 hours 01/10/24 0627 01/17/24 0559   01/09/24 2300  vancomycin  (VANCOCIN ) IVPB 1000 mg/200 mL premix        1,000 mg 200 mL/hr over 60 Minutes Intravenous  Once 01/09/24 2257 01/10/24 0100        Medication:   apixaban   5 mg Oral BID   atorvastatin   10 mg Oral Daily   buPROPion  150 mg Oral Daily   Chlorhexidine  Gluconate Cloth  6 each Topical Daily   cholecalciferol   2,000 Units Oral Daily   digoxin   125 mcg Oral Daily   diltiazem   120 mg Oral Daily   escitalopram   20 mg Oral Daily   feeding supplement  237 mL Oral BID BM   haloperidol lactate  2 mg Intramuscular Once   levothyroxine   88 mcg Oral Daily   metoprolol  tartrate  150 mg Oral BID    pantoprazole   40 mg Oral Q1200   polyethylene glycol  17 g Oral Daily   sennosides  5 mL Oral BID   sodium chloride   1 g Oral BID WC    acetaminophen , lip balm, melatonin, nystatin, mouth rinse   Objective:   Vitals:   01/12/24 0300 01/12/24 0400 01/12/24 0614 01/12/24 0619  BP: (!) 116/54 (!) 113/49 (!) 118/57   Pulse: 60 (!) 59  63  Resp: 15 15  19   Temp:    (!) 97.4 F (36.3 C)  TempSrc:    Oral  SpO2: 94% 93%  94%  Weight:      Height:        Intake/Output Summary (Last 24 hours) at 01/12/2024 1036 Last data filed at 01/11/2024 1700 Gross per 24 hour  Intake 508.73 ml  Output --  Net 508.73 ml   Filed Weights   01/09/24 2115  Weight: 77.1 kg     Physical examination:      General:  AAO x 2,  cooperative, no distress;   HEENT:  Normocephalic, PERRL, otherwise with in Normal limits   Neuro:  CNII-XII intact. , normal motor and sensation, reflexes intact   Lungs:   Clear to auscultation BL, Respirations unlabored,  No wheezes / crackles  Cardio:    S1/S2, irregularly irregular, no murmure, No Rubs or Gallops   Abdomen:  Soft, non-tender, bowel sounds active all four quadrants, no guarding or peritoneal signs.  Muscular  skeletal:  Limited exam -global generalized weaknesses - in bed, able to move all 4 extremities,   2+ pulses,  symmetric, No pitting edema  Skin:  Dry, warm to touch, negative for any Rashes,  Wounds: Please see nursing documentation      -----------------------------------------------------------------------------------------------------------------------------------------    LABs:     Latest Ref Rng & Units 01/12/2024    4:15 AM 01/11/2024    4:42 AM 01/09/2024    9:40 PM  CBC  WBC 4.0 - 10.5 K/uL 7.3  9.6  12.7   Hemoglobin 12.0 - 15.0 g/dL 85.3  85.3  82.2   Hematocrit 36.0 - 46.0 % 43.7  43.3  52.5   Platelets 150 - 400 K/uL 182  167  179       Latest Ref Rng & Units 01/12/2024    4:15 AM 01/11/2024    4:42 AM  01/10/2024    5:24 PM  CMP  Glucose 70 - 99 mg/dL 88  93    BUN 8 - 23 mg/dL 20  22    Creatinine 9.55 - 1.00 mg/dL 9.28  9.21    Sodium 864 - 145 mmol/L 131  125    Potassium 3.5 - 5.1 mmol/L 3.8  4.1  3.8   Chloride 98 - 111 mmol/L 97  91    CO2 22 - 32 mmol/L 24  23    Calcium  8.9 - 10.3 mg/dL 8.4  8.3    Total Protein 6.5 - 8.1 g/dL 6.1  6.2    Total Bilirubin 0.0 - 1.2 mg/dL 1.2  2.0    Alkaline Phos 38 - 126 U/L 115  116    AST 15 - 41 U/L 32  25    ALT 0 - 44 U/L 7  9         Micro Results Recent Results (from the past 240 hours)  MRSA Next Gen by PCR, Nasal     Status: None   Collection Time: 01/10/24  1:09 AM   Specimen: Nasal Mucosa; Nasal Swab  Result Value Ref Range Status   MRSA by PCR Next Gen NOT DETECTED NOT DETECTED Final    Comment: (NOTE) The GeneXpert MRSA Assay (FDA approved for NASAL specimens only), is one component of a comprehensive MRSA colonization surveillance program. It is not intended to diagnose MRSA infection nor to guide or monitor treatment for MRSA infections. Test performance is not FDA approved in patients less than 13 years old. Performed at [facility], [name] [place], [place] [address]     Radiology Reports No results found.   SIGNED: Adriana DELENA Grams, MD, FHM. FAAFP. [facility] - [facility] Critical care time spent - 55 min.  In seeing, evaluating and examining the patient. Reviewing medical records, labs, drawn plan of care. [facility],  Pager (please use [email or web address] to page/ text) Please use Epic Secure Chat for non-urgent communication (7AM-7PM)  If 7PM-7AM, please  contact night-coverage [email or web address], 01/12/2024, 10:36 AM

## 2024-01-12 NOTE — Progress Notes (Signed)
   Progress Note  Patient Name: Denise Pugh Date of Encounter: 01/12/2024  Primary Cardiologist: Debby Sor, MD (Inactive)  Telemetry reviewed, in V paced rhythm with underlying Afib. HR 60s in the last 24h. Med changes made yesterday. Continue metoprolol  tartarate 150 mg BID (switch from succinate), switch short acting diltiazem  to long acting diltiazem  120 mg once daily, digoxin  0.125 mg once daily, Eliquis  5 mg BID. Digoxin  level sub-therapeutic.  Cardiology will SIGN OFF.  Signed, Diannah SHAUNNA Maywood, MD  01/12/2024, 10:17 AM

## 2024-01-12 NOTE — Plan of Care (Signed)
  Problem: Clinical Measurements: Goal: Respiratory complications will improve Outcome: Progressing Goal: Cardiovascular complication will be avoided Outcome: Progressing   Problem: Coping: Goal: Level of anxiety will decrease Outcome: Progressing   

## 2024-01-12 NOTE — Evaluation (Signed)
 Physical Therapy Evaluation Patient Details Name: Denise Pugh MRN: [id number] DOB: Feb 14, 1929 Today's Date: 01/12/2024  History of Present Illness  Denise Pugh is a 89 y.o. female with medical history significant of persistent atrial fibrillation, hypothyroidism, hyperlipidemia, GERD, s/p pacemaker, anxiety who presents to the emergency department via EMS due to fall sustained at home.  Patient did not remember when and how she fell, some of the history was obtained from daughter at bedside, apparently she fell on her knees and presents with swollen right knee. Family checked on her and find her on the bed with legs being supported by a chair, EMS was activated and on arrival of EMS team, she was helped to get up and had difficulty in being able to bear weight due to right knee pain and swelling, she was also noted to be in A-fib with RVR with rates ranging within 150 to 160 BPM.    Clinical Impression  Pt. Presented with LE/UE pain and weakness along with having difficulty with maintaining balance independently and has a fear of falling. Pt. Required Mod A with bed mobility, and Mod A/Max A w/ RW for transfer and ambulation from bed to chair.  Nursing staff was notified on pt. Status. Patient will benefit from continued skilled physical therapy in hospital and recommended venue below to increase strength, balance, endurance for safe ADLs and gait.       If plan is discharge home, recommend the following: A lot of help with walking and/or transfers;A lot of help with bathing/dressing/bathroom;Assist for transportation;Assistance with cooking/housework;Help with stairs or ramp for entrance   Can travel by private vehicle    No    Equipment Recommendations Rolling walker (2 wheels)  Recommendations for Other Services       Functional Status Assessment Patient has had a recent decline in their functional status and demonstrates the ability to make significant improvements in function in a  reasonable and predictable amount of time.     Precautions / Restrictions Precautions Precautions: Fall Recall of Precautions/Restrictions: Intact Restrictions Weight Bearing Restrictions Per Provider Order: No      Mobility  Bed Mobility Overal bed mobility: Needs Assistance Bed Mobility: Supine to Sit     Supine to sit: Mod assist     General bed mobility comments: Pt. had diffculty with using UE support to get to EOB due to shoulder pain    Transfers Overall transfer level: Needs assistance Equipment used: Rolling walker (2 wheels) Transfers: Sit to/from Stand, Bed to chair/wheelchair/BSC Sit to Stand: Mod assist, Max assist   Step pivot transfers: Mod assist, Max assist            Ambulation/Gait Ambulation/Gait assistance: Max assist, Mod assist Gait Distance (Feet): 7 Feet Assistive device: Rolling walker (2 wheels) Gait Pattern/deviations: Step-through pattern, Decreased step length - right, Decreased step length - left, Antalgic, Decreased stance time - right, Decreased stance time - left, Decreased stride length, Trunk flexed       General Gait Details: Pt. had R. Knee pain during ambulation and had decreased UE support due to pain, to help with UE WB.  Stairs            Wheelchair Mobility     Tilt Bed    Modified Rankin (Stroke Patients Only)       Balance Overall balance assessment: Needs assistance Sitting-balance support: Feet supported, Bilateral upper extremity supported Sitting balance-Leahy Scale: Poor Sitting balance - Comments: Poor. Pt. had diffcutly with maintain stability, due  to wrist and shoulder pain Postural control: Posterior lean, Other (comment) (Pt. attended to slide towards EOB) Standing balance support: During functional activity, Reliant on assistive device for balance, Bilateral upper extremity supported Standing balance-Leahy Scale: Poor Standing balance comment: Poor. Pt.Pt. had diffcutly with maintain  stability, due to wrist and shoulder pain and LE weakness                             Pertinent Vitals/Pain Pain Assessment Pain Assessment: 0-10 Pain Score: 0-No pain Pain Location: Pt. mentioned pain during bed mobility in the R. Knee Pain Descriptors / Indicators: Discomfort, Aching Pain Intervention(s): Monitored during session    Home Living Family/patient expects to be discharged to:: Private residence Living Arrangements: Alone Available Help at Discharge: Family Type of Home: House Home Access: Ramped entrance;Stairs to enter Entrance Stairs-Rails: None Entrance Stairs-Number of Steps: 3 stair in the back of home   Home Layout: One level Home Equipment: Rollator (4 wheels) Additional Comments: Pt. uses rollator for household and community ambulation    Prior Function Prior Level of Function : Needs assist       Physical Assist : Mobility (physical);ADLs (physical) Mobility (physical): Gait;Transfers;Bed mobility;Stairs ADLs (physical): Grooming;Bathing;Dressing;Toileting Mobility Comments: Pt. requires assistance due weakness and pain ADLs Comments: Pt. granddaughter assists with grooming, bathing, dressing, toileting     Extremity/Trunk Assessment        Lower Extremity Assessment Lower Extremity Assessment: Generalized weakness;RLE deficits/detail RLE Deficits / Details: pain in knee RLE Sensation: decreased light touch RLE Coordination: decreased gross motor    Cervical / Trunk Assessment Cervical / Trunk Assessment: Kyphotic  Communication   Communication Communication: No apparent difficulties Factors Affecting Communication: Hearing impaired    Cognition Arousal: Alert Behavior During Therapy: WFL for tasks assessed/performed   PT - Cognitive impairments: No apparent impairments                         Following commands: Intact       Cueing Cueing Techniques: Verbal cues, Tactile cues, Visual cues, Gestural cues      General Comments      Exercises     Assessment/Plan    PT Assessment Patient needs continued PT services  PT Problem List Decreased range of motion;Decreased strength;Decreased activity tolerance;Decreased balance;Decreased mobility;Decreased coordination       PT Treatment Interventions DME instruction;Gait training;Stair training;Functional mobility training;Therapeutic activities;Therapeutic exercise;Balance training    PT Goals (Current goals can be found in the Care Plan section)  Acute Rehab PT Goals Patient Stated Goal: Pt. wants to go to rehab to improve strength to return home PT Goal Formulation: With patient Time For Goal Achievement: 01/20/24 Potential to Achieve Goals: Good    Frequency Min 3X/week     Co-evaluation               AM-PAC PT 6 Clicks Mobility  Outcome Measure Help needed turning from your back to your side while in a flat bed without using bedrails?: A Lot Help needed moving from lying on your back to sitting on the side of a flat bed without using bedrails?: A Lot Help needed moving to and from a bed to a chair (including a wheelchair)?: A Lot Help needed standing up from a chair using your arms (e.g., wheelchair or bedside chair)?: A Lot Help needed to walk in hospital room?: A Lot Help needed climbing 3-5 steps with a railing? :  Total 6 Click Score: 11    End of Session Equipment Utilized During Treatment: Gait belt Activity Tolerance: Patient limited by pain;Patient tolerated treatment well Patient left: in chair;with family/visitor present;with call bell/phone within reach Nurse Communication: Mobility status PT Visit Diagnosis: Unsteadiness on feet (R26.81);Repeated falls (R29.6);Muscle weakness (generalized) (M62.81)    Time: 8541-8471 PT Time Calculation (min) (ACUTE ONLY): 30 min   Charges:   PT Evaluation $PT Eval Moderate Complexity: 1 Mod PT Treatments $Therapeutic Activity: 23-37 mins PT General Charges $$ ACUTE  PT VISIT: 1 Visit         Abiel Antrim, SPT

## 2024-01-12 NOTE — Progress Notes (Signed)
 ? ?  Inpatient Rehab Admissions Coordinator : ? ?Per therapy recommendations patient was screened for CIR candidacy by Ottie Glazier RN MSN. Patient does not appear to demonstrate the medical neccesity for a Hospital Rehabilitation /CIR admit. I will not place a Rehab Consult. Recommend other Rehab Venues to be pursued. Please contact me with any questions. ? ?Ottie Glazier RN MSN ?Admissions Coordinator ?[phone]  ?

## 2024-01-12 NOTE — Plan of Care (Signed)
  Problem: Acute Rehab PT Goals(only PT should resolve) Goal: Pt will Roll Supine to Side Outcome: Progressing Flowsheets (Taken 01/12/2024 1553) Pt will Roll Supine to Side: with min assist Goal: Pt Will Go Supine/Side To Sit Outcome: Progressing Flowsheets (Taken 01/12/2024 1553) Pt will go Supine/Side to Sit: with minimal assist Goal: Patient Will Perform Sitting Balance Outcome: Progressing Flowsheets (Taken 01/12/2024 1553) Patient will perform sitting balance: with minimal assist Goal: Patient Will Transfer Sit To/From Stand Outcome: Progressing Flowsheets (Taken 01/12/2024 1553) Patient will transfer sit to/from stand:  with moderate assist  with minimal assist Goal: Pt Will Transfer Bed To Chair/Chair To Bed Outcome: Progressing Flowsheets (Taken 01/12/2024 1553) Pt will Transfer Bed to Chair/Chair to Bed:  with min assist  with mod assist Goal: Pt Will Perform Standing Balance Or Pre-Gait Outcome: Progressing Flowsheets (Taken 01/12/2024 1553) Pt will perform standing balance or pre-gait: with moderate assist Goal: Pt Will Ambulate Outcome: Progressing Flowsheets (Taken 01/12/2024 1553) Pt will Ambulate:  15 feet  with moderate assist Note: With RW and gait belt  [employer], SPT

## 2024-01-12 NOTE — Progress Notes (Signed)
 Daily Progress Note   Date: 01/12/2024   Patient Name: Denise Pugh  DOB: [birth year]  MRN: [id number]  Age / Sex: 89 y.o., female  Attending Physician: Willette Adriana LABOR, MD Primary Care Physician: Shona Norleen PEDLAR, MD Admit Date: 01/09/2024 Length of Stay: 2 days  Reason for Follow-up: Establishing goals of care  Past Medical History:  Diagnosis Date   Arrhythmia    HX of SVT. CARDIONET MONITOR 07/15/12 TO 08/13/12   Arthritis    Family history of adverse reaction to anesthesia 1970s   mother   GERD (gastroesophageal reflux disease)    Hyperlipidemia 08/06/11   lexiscan myoview-normal. Due to severe attenuation the study specificity and sensitivty are deminished.   Hypertension 08/25/10   ECHO-EF 60-65%   Hypothyroidism    Irregular heart beat    Shortness of breath    Sleep apnea    SLEEP STUDY-[facility]   Thyroid  disease     Subjective:   Subjective: Chart Reviewed. Updates received. Patient Assessed. Created space and opportunity for patient  and family to explore thoughts and feelings regarding current medical situation.  Today's Discussion: Today before meeting with the patient/family, I reviewed the chart notes including nursing note from yesterday, cardiology note from yesterday, hospitalist note from yesterday, nursing note from today, cardiology note from today, hospitalist note from today. I also reviewed vital signs, nursing flowsheets, medication administrations record, labs, and imaging. Labs reviewed include CBC which shows normalization of white blood cell count in the past 48 hours in the setting of leukocytosis at 12.7 on admission.  CMP shows persistent hyponatremia, though improved at 131 in the setting of admitted hyponatremia.  Potassium has normalized to 3.8 today in the setting of admission hypokalemia at 3.0 with cardiac arrhythmia.  Albumin is quite low at 2.9 in the setting of weakness and failure to thrive.  Today saw the patient at  bedside, her nephew was present.  She is having pain in her lower back, likely because of remaining in the bed.  She denies nausea, vomiting, chest pain, dyspnea.  She notes she has a good appetite and ate a good breakfast this morning.  We reviewed previously collected decisions including DNR/DNI, no feeding tubes.  Goal is to treat the treatable, attempted to recover from acute illness, discharge to rehab for strengthening.  Family is trying to think of appropriate ways to help her better thrive in the outpatient, they are concerned she cannot continue to live alone without assistance.  I shared because goals are clear, palliative medicine would back off.  However we are available for any new needs. I provided emotional and general support through therapeutic listening, empathy, sharing of stories, and other techniques. I answered all questions and addressed all concerns to the best of my ability.  Review of Systems  Constitutional:  Negative for appetite change.  Respiratory:  Negative for chest tightness and shortness of breath.   Cardiovascular:  Negative for chest pain.  Gastrointestinal:  Negative for abdominal pain, nausea and vomiting.  Neurological:  Positive for weakness.    Objective:   Primary Diagnoses: Present on Admission:  Paroxysmal atrial fibrillation with RVR (HCC)  Essential hypertension  Mixed hyperlipidemia  GERD (gastroesophageal reflux disease)   Vital Signs:  BP (!) 118/57   Pulse 63   Temp (!) 97.4 F (36.3 C) (Oral)   Resp 19   Ht 5' 3 (1.6 m)   Wt 77.1 kg   SpO2 94%   BMI 30.11  kg/m   Physical Exam Vitals and nursing note reviewed.  Constitutional:      General: She is not in acute distress.    Appearance: She is ill-appearing.  HENT:     Head: Normocephalic and atraumatic.  Cardiovascular:     Rate and Rhythm: Normal rate and regular rhythm.  Pulmonary:     Effort: Pulmonary effort is normal. No respiratory distress.     Breath sounds: No  wheezing or rhonchi.  Abdominal:     General: Abdomen is flat. Bowel sounds are normal. There is no distension.     Palpations: Abdomen is soft.     Tenderness: There is no abdominal tenderness.  Skin:    General: Skin is warm and dry.  Neurological:     General: No focal deficit present.     Mental Status: She is alert.  Psychiatric:        Mood and Affect: Mood normal.        Behavior: Behavior normal.     Palliative Assessment/Data: 60%   Existing Vynca/ACP Documentation: None  Assessment & Plan:   HPI/Patient Profile:  89 y.o. female  with past medical history of p-afib s/p pacemaker, hypothyroidism admitted on 01/09/2024 after a fall at home. Patient unable to recall when and how she fell but was found by family and brought in by EMS. Patient noted to be in A-fib with RVR which may have contributed to her fall. Palliative medicine consulted to discuss goals of care and code status.   SUMMARY OF RECOMMENDATIONS   DNR-Limited (DNR/DNI) Continue to treat the treatable Open to SNF/rehab at discharge Palliative medicine will back off Please notify us  of any significant clinical change or new palliative needs  Symptom Management:  Per primary team Palliative medicine is available to assist as needed  Code Status: DNR - Limited (DNR/DNI)  Prognosis: Unable to determine  Discharge Planning: SNF/rehab  Discussed with: Patient, medical team, nursing team  Thank you for allowing us  to participate in the care of Denise Pugh PMT will continue to support holistically.  Billing based on MDM: Moderate  Detailed review of medical records (labs, imaging, vital signs), medically appropriate exam, discussed with treatment team, counseling and education to patient, family, & staff, documenting clinical information, medication management, coordination of care  Camellia Kays, NP Palliative Medicine Team  Team Phone # [phone] (Nights/Weekends)  11/19/2020, 8:17 AM

## 2024-01-13 DIAGNOSIS — I480 Paroxysmal atrial fibrillation: Secondary | ICD-10-CM | POA: Diagnosis not present

## 2024-01-13 LAB — COMPREHENSIVE METABOLIC PANEL WITH GFR
ALT: 18 U/L (ref 0–44)
AST: 85 U/L — ABNORMAL HIGH (ref 15–41)
Albumin: 2.9 g/dL — ABNORMAL LOW (ref 3.5–5.0)
Alkaline Phosphatase: 149 U/L — ABNORMAL HIGH (ref 38–126)
Anion gap: 9 (ref 5–15)
BUN: 19 mg/dL (ref 8–23)
CO2: 25 mmol/L (ref 22–32)
Calcium: 8.3 mg/dL — ABNORMAL LOW (ref 8.9–10.3)
Chloride: 99 mmol/L (ref 98–111)
Creatinine, Ser: 0.73 mg/dL (ref 0.44–1.00)
GFR, Estimated: >60 mL/min (ref >60)
Glucose, Bld: 93 mg/dL (ref 70–99)
Potassium: 4 mmol/L (ref 3.5–5.1)
Sodium: 133 mmol/L — ABNORMAL LOW (ref 135–145)
Total Bilirubin: 1 mg/dL (ref 0.0–1.2)
Total Protein: 5.9 g/dL — ABNORMAL LOW (ref 6.5–8.1)

## 2024-01-13 LAB — CBC
HCT: 42.7 % (ref 36.0–46.0)
Hemoglobin: 14.6 g/dL (ref 12.0–15.0)
MCH: 31.5 pg (ref 26.0–34.0)
MCHC: 34.2 g/dL (ref 30.0–36.0)
MCV: 92.2 fL (ref 80.0–100.0)
Platelets: 183 K/uL (ref 150–400)
RBC: 4.63 MIL/uL (ref 3.87–5.11)
RDW: 14.2 % (ref 11.5–15.5)
WBC: 6.1 K/uL (ref 4.0–10.5)
nRBC: 0 % (ref 0.0–0.2)

## 2024-01-13 MED ORDER — DIGOXIN 125 MCG PO TABS: 125.0000 ug | ORAL_TABLET | Freq: Every day | ORAL | 2 refills | Status: DC

## 2024-01-13 MED ORDER — CEFADROXIL 500 MG PO CAPS: 500.0000 mg | ORAL_CAPSULE | Freq: Two times a day (BID) | ORAL | 0 refills | Status: DC

## 2024-01-13 MED ORDER — DILTIAZEM HCL ER COATED BEADS 120 MG PO CP24: 120.0000 mg | ORAL_CAPSULE | Freq: Every day | ORAL | 1 refills | Status: DC

## 2024-01-13 MED ORDER — CEFADROXIL 500 MG PO CAPS
500.0000 mg | ORAL_CAPSULE | Freq: Two times a day (BID) | ORAL | Status: DC
  Administered 2024-01-13 – 2024-01-15 (×5): 500 mg via ORAL
  Filled 2024-01-13 (×5): qty 1

## 2024-01-13 MED ORDER — MELATONIN 3 MG PO TABS: 6.0000 mg | ORAL_TABLET | Freq: Every evening | ORAL | 0 refills | Status: AC | PRN

## 2024-01-13 MED ORDER — ESCITALOPRAM OXALATE 20 MG PO TABS: 20.0000 mg | ORAL_TABLET | Freq: Every day | ORAL | 3 refills | Status: AC

## 2024-01-13 MED ORDER — LEVOTHYROXINE SODIUM 88 MCG PO TABS: 88.0000 ug | ORAL_TABLET | Freq: Every day | ORAL | 0 refills | Status: DC

## 2024-01-13 MED ORDER — SENNOSIDES 8.8 MG/5ML PO SYRP: 5.0000 mL | ORAL_SOLUTION | Freq: Two times a day (BID) | ORAL | 0 refills | Status: DC

## 2024-01-13 NOTE — Plan of Care (Signed)
   Problem: Education: Goal: Knowledge of General Education information will improve Description: Including pain rating scale, medication(s)/side effects and non-pharmacologic comfort measures Outcome: Progressing   Problem: Safety: Goal: Ability to remain free from injury will improve Outcome: Progressing   Problem: Skin Integrity: Goal: Risk for impaired skin integrity will decrease Outcome: Progressing

## 2024-01-13 NOTE — Care Management Important Message (Signed)
 Important Message  Patient Details  Name: Denise Pugh MRN: [id number] Date of Birth: 08-27-1928   Important Message Given:  N/A - LOS <3 / Initial given by admissions     Denise Pugh [name] 01/13/2024, 11:20 AM

## 2024-01-13 NOTE — Evaluation (Signed)
 Occupational Therapy Evaluation Patient Details Name: Denise Pugh MRN: [id number] DOB: 12/12/1928 Today's Date: 01/13/2024   History of Present Illness   Denise Pugh is a 89 y.o. female with medical history significant of persistent atrial fibrillation, hypothyroidism, hyperlipidemia, GERD, s/p pacemaker, anxiety who presents to the emergency department via EMS due to fall sustained at home.  Patient did not remember when and how she fell, some of the history was obtained from daughter at bedside, apparently she fell on her knees and presents with swollen right knee. Family checked on her and find her on the bed with legs being supported by a chair, EMS was activated and on arrival of EMS team, she was helped to get up and had difficulty in being able to bear weight due to right knee pain and swelling, she was also noted to be in A-fib with RVR with rates ranging within 150 to 160 BPM. (per DO)     Clinical Impressions Pt agreeable to OT evaluation. Nephew present in the room during evaluation and providing some information on pt's baseline. Pt reports independence with ADL's but nephew and the chart indicate need for assistance. Pt uses the rollator at baseline. Today pt required mod  A for sit to stand from chair with initial posterior lean. Pt's balance improved with cuing and she was able to take a couple steps forwards and backwards. Pt was able to doff sock but required assist to don, indicating need for mod to max A for LB ADL tasks. Pt R shoulder was slightly limited in A/ROM with increased pain near end range. Pt left in the chair with chair alarm set and call bell within reach. Pt will benefit from continued OT in the hospital to increase strength, balance, and endurance for safe ADL's.        If plan is discharge home, recommend the following:   A lot of help with bathing/dressing/bathroom;A little help with walking and/or transfers;Assistance with cooking/housework;Assist for  transportation;Help with stairs or ramp for entrance;Direct supervision/assist for medications management     Functional Status Assessment   Patient has had a recent decline in their functional status and demonstrates the ability to make significant improvements in function in a reasonable and predictable amount of time.     Equipment Recommendations   None recommended by OT            Precautions/Restrictions   Precautions Precautions: Fall Recall of Precautions/Restrictions: Intact Restrictions Weight Bearing Restrictions Per Provider Order: No     Mobility Bed Mobility               General bed mobility comments: Pt seated in the chair to start the session.    Transfers Overall transfer level: Needs assistance Equipment used: Rolling walker (2 wheels) Transfers: Sit to/from Stand Sit to Stand: Mod assist           General transfer comment: Sit to stand with mod A using RW followed by lateral steps, marching in place, and a few steps forward and backwards.      Balance Overall balance assessment: Needs assistance Sitting-balance support: Feet supported, No upper extremity supported Sitting balance-Leahy Scale: Fair Sitting balance - Comments: fair to good in the chair   Standing balance support: During functional activity, Reliant on assistive device for balance, Bilateral upper extremity supported Standing balance-Leahy Scale: Poor Standing balance comment: poor to fair with RW; posterior lean initially that improved with cuing.  ADL either performed or assessed with clinical judgement   ADL Overall ADL's : Needs assistance/impaired     Grooming: Set up;Sitting   Upper Body Bathing: Set up;Sitting   Lower Body Bathing: Sitting/lateral leans;Moderate assistance;Maximal assistance   Upper Body Dressing : Set up;Sitting   Lower Body Dressing: Sitting/lateral leans;Moderate assistance;Maximal  assistance Lower Body Dressing Details (indicate cue type and reason): Able to doff sock but required assist to don while seated in the recliner. Toilet Transfer: Moderate assistance;Minimal assistance;Stand-pivot;Rolling walker (2 wheels) Toilet Transfer Details (indicate cue type and reason): Simulated via Sit to stand from chair followed by a few steps forward and backward. Toileting- Clothing Manipulation and Hygiene: Sitting/lateral lean;Moderate assistance;Maximal assistance       Functional mobility during ADLs: Minimal assistance;Moderate assistance;Rolling walker (2 wheels) General ADL Comments: Able to ambualte ~5 feet total forward and backward with the RW.     Vision Baseline Vision/History: 6 Macular Degeneration Ability to See in Adequate Light: 3 Highly impaired Patient Visual Report: No change from baseline Vision Assessment?:  (Impaired at baseline)     Perception Perception: Not tested       Praxis Praxis: Not tested       Pertinent Vitals/Pain Pain Assessment Pain Assessment: Faces Faces Pain Scale: Hurts a little bit Pain Location: R knee pain Pain Descriptors / Indicators: Discomfort Pain Intervention(s): Monitored during session, Repositioned     Extremity/Trunk Assessment Upper Extremity Assessment Upper Extremity Assessment: Generalized weakness;RUE deficits/detail RUE Deficits / Details: 3-/5 shoulder flexion with reports of pain. Full P/ROm with slight pain at end range for shoulder flexion. 4/5 grossly otherwise.   Lower Extremity Assessment Lower Extremity Assessment: Defer to PT evaluation   Cervical / Trunk Assessment Cervical / Trunk Assessment: Kyphotic   Communication Communication Communication: Impaired Factors Affecting Communication: Reduced clarity of speech;Other (comment) (Jumbled speech sometimes.)   Cognition Arousal: Alert Behavior During Therapy: WFL for tasks assessed/performed Cognition: Cognition impaired    Orientation impairments: Place, Time         OT - Cognition Comments: Pt confused but able to fllow commands.                 Following commands: Intact       Cueing  General Comments   Cueing Techniques: Verbal cues;Tactile cues;Visual cues;Gestural cues                 Home Living   Living Arrangements: Alone Available Help at Discharge: Family Type of Home: House Home Access: Ramped entrance;Stairs to enter Entrance Stairs-Number of Steps: 3 stair in the back of home Entrance Stairs-Rails: None Home Layout: One level     Bathroom Shower/Tub: Chief Strategy Officer: Standard     Home Equipment: Rollator (4 wheels)          Prior Functioning/Environment Prior Level of Function : Needs assist;Patient poor historian/Family not available       Physical Assist : ADLs (physical)     Mobility Comments: Pt reports ambulation with Rollator. ADLs Comments: Pt. granddaughter assists with grooming, bathing, dressing, toileting, per chart. Today pt reported independence.    OT Problem List: Decreased strength;Decreased range of motion;Decreased activity tolerance;Impaired balance (sitting and/or standing);Decreased cognition   OT Treatment/Interventions: Self-care/ADL training;Therapeutic exercise;Therapeutic activities;Patient/family education;Balance training;DME and/or AE instruction;Cognitive remediation/compensation      OT Goals(Current goals can be found in the care plan section)   Acute Rehab OT Goals Patient Stated Goal: Improve function. OT Goal Formulation: With patient/family Time For  Goal Achievement: 01/27/24 Potential to Achieve Goals: Good   OT Frequency:  Min 2X/week                                   End of Session Equipment Utilized During Treatment: Gait belt;Rolling walker (2 wheels)  Activity Tolerance: Patient tolerated treatment well Patient left: in chair;with call bell/phone within reach;with  chair alarm set;with family/visitor present  OT Visit Diagnosis: Unsteadiness on feet (R26.81);Other abnormalities of gait and mobility (R26.89);Muscle weakness (generalized) (M62.81);History of falling (Z91.81);Other symptoms and signs involving cognitive function                Time: 8870-8851 OT Time Calculation (min): 19 min Charges:  OT General Charges $OT Visit: 1 Visit OT Evaluation $OT Eval Low Complexity: 1 Low  Jovonni Borquez OT, MOT  Jayson Person 01/13/2024, 1:23 PM

## 2024-01-13 NOTE — Plan of Care (Signed)
  Problem: Acute Rehab OT Goals (only OT should resolve) Goal: Pt. Will Perform Grooming Flowsheets (Taken 01/13/2024 1325) Pt Will Perform Grooming:  standing  with contact guard assist Goal: Pt. Will Perform Upper Body Dressing Flowsheets (Taken 01/13/2024 1325) Pt Will Perform Upper Body Dressing: with modified independence Goal: Pt. Will Perform Lower Body Dressing Flowsheets (Taken 01/13/2024 1325) Pt Will Perform Lower Body Dressing:  with contact guard assist  sitting/lateral leans Goal: Pt. Will Transfer To Toilet Flowsheets (Taken 01/13/2024 1325) Pt Will Transfer to Toilet:  with supervision  stand pivot transfer Goal: Pt. Will Perform Toileting-Clothing Manipulation Flowsheets (Taken 01/13/2024 1325) Pt Will Perform Toileting - Clothing Manipulation and hygiene:  sitting/lateral leans  with contact guard assist Goal: Pt/Caregiver Will Perform Home Exercise Program Flowsheets (Taken 01/13/2024 1325) Pt/caregiver will Perform Home Exercise Program:  Increased ROM  Increased strength  Right Upper extremity  Left upper extremity  Independently  Caly Pellum OT, MOT

## 2024-01-13 NOTE — TOC Initial Note (Signed)
 Transition of Care [facility]) - Initial/Assessment Note    Patient Details  Name: Denise Pugh MRN: [id number] Date of Birth: 1928/05/14  Transition of Care [facility]) CM/SW Contact:    Sharlyne Stabs, RN Phone Number: 01/13/2024, 10:55 AM  Clinical Narrative:        CM at the bedside to discuss PT eval with patient. She did not qualify for CIR. She states she can not go home alone. She is agreeable to SNF, CM spoke with her daughter, she also agrees she need to get stronger before coming home.  We dicussed her insurance and limited offers. FL2 completed and sent to [facility] they are contracted with [facility]. IPCM following.            Expected Discharge Plan: Skilled Nursing Facility Barriers to Discharge: SNF Pending bed offer   Patient Goals and CMS Choice Patient states their goals for this hospitalization and ongoing recovery are:: Agreeable to SNF CMS Medicare.gov Compare Post Acute Care list provided to:: Patient Choice offered to / list presented to : Patient [place] ownership interest in [facility].provided to:: Patient   Expected Discharge Plan and Services      Living arrangements for the past 2 months: Single Family Home Expected Discharge Date: 01/13/24                    Prior Living Arrangements/Services Living arrangements for the past 2 months: Single Family Home Lives with:: Self Patient language and need for interpreter reviewed:: Yes Do you feel safe going back to the place where you live?: Yes      Need for Family Participation in Patient Care: Yes (Comment) Care giver support system in place?: Yes (comment)   Criminal Activity/Legal Involvement Pertinent to Current Situation/Hospitalization: No - Comment as needed  Activities of Daily Living   ADL Screening (condition at time of admission) Independently performs ADLs?: Yes (appropriate for developmental age) Is the patient deaf or have difficulty hearing?: Yes Does the  patient have difficulty seeing, even when wearing glasses/contacts?: No Does the patient have difficulty concentrating, remembering, or making decisions?: Yes  Permission Sought/Granted            Permission granted to share info w Relationship: Daughter     Emotional Assessment     Affect (typically observed): Accepting Orientation: : Oriented to Self Alcohol / Substance Use: Not Applicable Psych Involvement: No (comment)  Admission diagnosis:  Dehydration [E86.0] Arthritis [M19.90] Atrial fibrillation with rapid ventricular response (HCC) [I48.91] Cellulitis of right hand [L03.113] Paroxysmal atrial fibrillation with RVR (HCC) [I48.0] Patient Active Problem List   Diagnosis Date Noted   Cellulitis of right hand 01/10/2024   Hypokalemia 01/10/2024   Hyponatremia 01/10/2024   Leukocytosis 01/10/2024   Dehydration 01/10/2024   Palliative care by specialist 01/10/2024   DNR (do not resuscitate) 01/10/2024   Failure to thrive in adult 01/10/2024   Paroxysmal atrial fibrillation with RVR (HCC) 01/09/2024   Osteoarthritis of left knee 03/05/2021   Osteoarthritis of right knee 03/05/2021   Apical variant hypertrophic cardiomyopathy (HCC) 04/26/2018   Pacemaker 04/26/2018   Other persistent atrial fibrillation (HCC)    Persistent atrial fibrillation (HCC)    Neck pain 05/15/2017   First degree AV block 01/13/2016   Long term current use of anticoagulant 01/13/2016   Anticoagulation adequate 02/12/2015   Mixed hyperlipidemia 02/28/2014   Tachycardia-bradycardia syndrome (HCC) 07/21/2013   Bradycardia, drug induced 07/20/2013   Symptomatic sinus bradycardia 07/20/2013  S/P cardiac pacemaker procedure: Dr. Francyne: Generator [employer] Advisa MRI  07/20/2013    Class: Hospitalized for   Atrial fibrillation with RVR (HCC) 07/18/2013   Bradycardia, also idioventricular rhythm  07/18/2013   PAF (paroxysmal atrial fibrillation) (HCC) 06/26/2013   Grieving 05/29/2013   CAP  (community acquired pneumonia) 04/08/2011   Hypothyroidism (acquired) 04/08/2011   Enthesopathy 06/15/2008   RHINITIS, CHRONIC 04/30/2008   HIP PAIN, RIGHT 04/30/2008   KNEE PAIN, BILATERAL 04/30/2008   INTERTRIGO, CANDIDAL 01/05/2008   DIARRHEA 01/05/2008   COSTOCHONDRITIS 06/29/2007   HYPERLIPIDEMIA 09/30/2006   FATIGUE 09/30/2006   PARESTHESIA 09/30/2006   GASTRIC POLYP 03-08-06   Hypothyroidism 08-Mar-2006   DEPRESSION, recent death of her husband 03-08-06   Essential hypertension Mar 08, 2006   SUPRAVENTRICULAR TACHYCARDIA 2006/03/08   GERD (gastroesophageal reflux disease) Mar 08, 2006   MELANOSIS COLI March 08, 2006   Osteoarthritis 08-Mar-2006   LOW BACK PAIN March 08, 2006   DIVERTICULITIS, HX OF 2006/03/08   PCP:  Shona Norleen PEDLAR, MD Pharmacy:   [facility] [place], [place] - [address] [place] [address] Phone: [phone] Fax: [phone]     Social Drivers of Health (SDOH) Social History: SDOH Screenings   Food Insecurity: No Food Insecurity (01/10/2024)  Housing: Low Risk  (01/10/2024)  Transportation Needs: No Transportation Needs (01/10/2024)  Utilities: Not At Risk (01/10/2024)  Social Connections: Socially Isolated (01/10/2024)  Tobacco Use: Low Risk  (10/15/2022)   SDOH Interventions: Social Connections Interventions: Inpatient TOC, Intervention Not Indicated  Readmission Risk Interventions     No data to display

## 2024-01-13 NOTE — TOC Progression Note (Signed)
 Transition of Care [facility]) - Progression Note    Patient Details  Name: KHADIJA THIER MRN: [id number] Date of Birth: 05-31-1928  [facility] CM/SW Contact  Sharlyne Stabs, RN Phone Number: 01/13/2024, 1:21 PM  Clinical Narrative:   CM at the bedside to discuss bed offers with Patient, Daughter and granddaughter. Patient is eating lunch and agreeable to SNF. They want [facility]. [name] starting [employer], MD updated.    Expected Discharge Plan: Skilled Nursing Facility Barriers to Discharge: Insurance Authorization     Expected Discharge Plan and Services       Living arrangements for the past 2 months: Single Family Home Expected Discharge Date: 01/13/24                   Social Drivers of Health (SDOH) Interventions SDOH Screenings   Food Insecurity: No Food Insecurity (01/10/2024)  Housing: Low Risk  (01/10/2024)  Transportation Needs: No Transportation Needs (01/10/2024)  Utilities: Not At Risk (01/10/2024)  Social Connections: Socially Isolated (01/10/2024)  Tobacco Use: Low Risk  (10/15/2022)    Readmission Risk Interventions     No data to display

## 2024-01-13 NOTE — [place] FL2 (Signed)
 [place]  MEDICAID FL2 LEVEL OF CARE FORM     IDENTIFICATION  Patient Name: Denise Pugh Birthdate: 1928/07/07 Sex: female Admission Date (Current Location): 01/09/2024  [facility] and [place] Number:  [employer] and Address:  [facility],  [address] [place] [address]      Provider Number: [id number]  Attending Physician Name and Address:  Willette Adriana LABOR, MD  Relative Name and Phone Number:  EZRA GUNNER (Daughter)  [phone]    Current Level of Care: Hospital Recommended Level of Care: Skilled Nursing Facility Prior Approval Number:    Date Approved/Denied:   PASRR Number: [id number]  Discharge Plan: SNF    Current Diagnoses: Patient Active Problem List   Diagnosis Date Noted   Cellulitis of right hand 01/10/2024   Hypokalemia 01/10/2024   Hyponatremia 01/10/2024   Leukocytosis 01/10/2024   Dehydration 01/10/2024   Palliative care by specialist 01/10/2024   DNR (do not resuscitate) 01/10/2024   Failure to thrive in adult 01/10/2024   Paroxysmal atrial fibrillation with RVR (HCC) 01/09/2024   Osteoarthritis of left knee 03/05/2021   Osteoarthritis of right knee 03/05/2021   Apical variant hypertrophic cardiomyopathy (HCC) 04/26/2018   Pacemaker 04/26/2018   Other persistent atrial fibrillation (HCC)    Persistent atrial fibrillation (HCC)    Neck pain 05/15/2017   First degree AV block 01/13/2016   Long term current use of anticoagulant 01/13/2016   Anticoagulation adequate 02/12/2015   Mixed hyperlipidemia 02/28/2014   Tachycardia-bradycardia syndrome (HCC) 07/21/2013   Bradycardia, drug induced 07/20/2013   Symptomatic sinus bradycardia 07/20/2013   S/P cardiac pacemaker procedure: Dr. Francyne: Generator [employer] Advisa MRI  07/20/2013   Atrial fibrillation with RVR (HCC) 07/18/2013   Bradycardia, also idioventricular rhythm  07/18/2013   PAF (paroxysmal atrial fibrillation) (HCC) 06/26/2013   Grieving 05/29/2013    CAP (community acquired pneumonia) 04/08/2011   Hypothyroidism (acquired) 04/08/2011   Enthesopathy 06/15/2008   RHINITIS, CHRONIC 04/30/2008   HIP PAIN, RIGHT 04/30/2008   KNEE PAIN, BILATERAL 04/30/2008   INTERTRIGO, CANDIDAL 01/05/2008   DIARRHEA 01/05/2008   COSTOCHONDRITIS 06/29/2007   HYPERLIPIDEMIA 09/30/2006   FATIGUE 09/30/2006   PARESTHESIA 09/30/2006   GASTRIC POLYP Mar 08, 2006   Hypothyroidism March 08, 2006   DEPRESSION, recent death of her husband March 08, 2006   Essential hypertension March 08, 2006   SUPRAVENTRICULAR TACHYCARDIA 03/08/2006   GERD (gastroesophageal reflux disease) 03/08/2006   MELANOSIS COLI 03-08-2006   Osteoarthritis 03-08-2006   LOW BACK PAIN March 08, 2006   DIVERTICULITIS, HX OF March 08, 2006    Orientation RESPIRATION BLADDER Height & Weight     Self  Normal Continent Weight: 77.1 kg Height:  5' 3 (160 cm)  BEHAVIORAL SYMPTOMS/MOOD NEUROLOGICAL BOWEL NUTRITION STATUS      Continent Diet (See DC Summary)  AMBULATORY STATUS COMMUNICATION OF NEEDS Skin   Extensive Assist Verbally Bruising                       Personal Care Assistance Level of Assistance  Bathing, Dressing Bathing Assistance: Maximum assistance   Dressing Assistance: Limited assistance     Functional Limitations Info  Sight, Hearing, Speech Sight Info: Impaired Hearing Info: Impaired Speech Info: Adequate    SPECIAL CARE FACTORS FREQUENCY  PT (By licensed PT)     PT Frequency: 5 times  a week              Contractures Contractures Info: Not present    Additional Factors Info  Code Status Code Status Info: DNR- Limited  Current Medications (01/13/2024):  This is the current hospital active medication list Current Facility-Administered Medications  Medication Dose Route Frequency Provider Last Rate Last Admin   acetaminophen  (TYLENOL ) tablet 650 mg  650 mg Oral Q6H PRN Adefeso, Oladapo, DO   650 mg at 01/10/24 0228   apixaban  (ELIQUIS ) tablet 5  mg  5 mg Oral BID Adefeso, Oladapo, DO   5 mg at 01/13/24 9157   atorvastatin  (LIPITOR) tablet 10 mg  10 mg Oral Daily Adefeso, Oladapo, DO   10 mg at 01/13/24 0842   buPROPion (WELLBUTRIN XL) 24 hr tablet 150 mg  150 mg Oral Daily Shahmehdi, Seyed A, MD   150 mg at 01/12/24 1052   ceFAZolin (ANCEF) IVPB 2g/100 mL premix  2 g Intravenous Q8H Adefeso, Oladapo, DO 200 mL/hr at 01/13/24 0604 2 g at 01/13/24 0604   Chlorhexidine  Gluconate Cloth 2 % PADS 6 each  6 each Topical Daily Adefeso, Oladapo, DO   6 each at 01/12/24 1054   cholecalciferol  (VITAMIN D3) 25 MCG (1000 UNIT) tablet 2,000 Units  2,000 Units Oral Daily Shahmehdi, Seyed A, MD   2,000 Units at 01/13/24 9157   digoxin  (LANOXIN ) tablet 125 mcg  125 mcg Oral Daily Shahmehdi, Adriana A, MD   125 mcg at 01/13/24 0842   diltiazem  (CARDIZEM  CD) 24 hr capsule 120 mg  120 mg Oral Daily Mallipeddi, Vishnu P, MD   120 mg at 01/13/24 0843   escitalopram  (LEXAPRO ) tablet 20 mg  20 mg Oral Daily Shahmehdi, Seyed A, MD   20 mg at 01/13/24 0842   feeding supplement (ENSURE PLUS HIGH PROTEIN) liquid 237 mL  237 mL Oral BID BM Shahmehdi, Seyed A, MD   237 mL at 01/12/24 1054   haloperidol lactate (HALDOL) injection 2 mg  2 mg Intramuscular Once Adefeso, Oladapo, DO       levothyroxine  (SYNTHROID ) tablet 88 mcg  88 mcg Oral Daily Shahmehdi, Seyed A, MD   88 mcg at 01/13/24 0604   lip balm (CARMEX) ointment   Topical PRN Adefeso, Oladapo, DO       magnesium oxide (MAG-OX) tablet 200 mg  200 mg Oral Daily Shahmehdi, Seyed A, MD   200 mg at 01/13/24 0843   melatonin tablet 6 mg  6 mg Oral QHS PRN Adefeso, Oladapo, DO   6 mg at 01/12/24 2054   metoprolol  tartrate (LOPRESSOR ) tablet 150 mg  150 mg Oral BID Mallipeddi, Vishnu P, MD   150 mg at 01/13/24 0843   nystatin (MYCOSTATIN/NYSTOP) topical powder   Topical BID PRN Shahmehdi, Seyed A, MD       Oral care mouth rinse  15 mL Mouth Rinse PRN Adefeso, Oladapo, DO       pantoprazole  (PROTONIX ) EC tablet 40 mg  40  mg Oral Q1200 Adefeso, Oladapo, DO   40 mg at 01/12/24 1133   polyethylene glycol (MIRALAX / GLYCOLAX) packet 17 g  17 g Oral Daily Shahmehdi, Seyed A, MD   17 g at 01/13/24 0844   sennosides (SENOKOT) 8.8 MG/5ML syrup 5 mL  5 mL Oral BID Shahmehdi, Seyed A, MD   5 mL at 01/13/24 0847   simethicone (MYLICON) 40 MG/0.6ML suspension 40 mg  40 mg Oral QID PRN Shahmehdi, Seyed A, MD   40 mg at 01/12/24 2054   sodium chloride  tablet 1 g  1 g Oral BID WC Shahmehdi, Seyed A, MD   1 g at 01/12/24 1644     Discharge Medications: Please see discharge  summary for a list of discharge medications.  Relevant Imaging Results:  Relevant Lab Results:   Additional Information SS# [id number]  Sharlyne Stabs, RN

## 2024-01-13 NOTE — Discharge Summary (Signed)
 Physician Discharge Summary   Patient: Denise Pugh MRN: [id number] DOB: May 06, 1928  Admit date:     01/09/2024  Discharge date: 01/14/24  Discharge Physician: Adriana DELENA Grams   PCP: Shona Norleen PEDLAR, MD   Recommendations at discharge:   Follow with PCP in 1 week Follow-up with the palliative care team Continue follow-up with cardiology medications after to change Aggressive PT OT, fall precautions  Discharge Diagnoses: Principal Problem:   Paroxysmal atrial fibrillation with RVR (HCC) Active Problems:   Essential hypertension   GERD (gastroesophageal reflux disease)   Mixed hyperlipidemia   Cellulitis of right hand   Hypokalemia   Hyponatremia   Leukocytosis   Dehydration   Palliative care by specialist   DNR (do not resuscitate)   Failure to thrive in adult  Resolved Problems:   * No resolved hospital problems. *  Hospital Course: Denise Pugh is a 89 y.o. female with medical history significant of persistent atrial fibrillation, hypothyroidism, hyperlipidemia, GERD, s/p pacemaker, anxiety who presents to the emergency department via EMS due to fall sustained at home.  Patient did not remember when and how she fell, some of the history was obtained from daughter at bedside, apparently she fell on her knees and presents with swollen right knee. Family checked on her and find her on the bed with legs being supported by a chair, EMS was activated and on arrival of EMS team, she was helped to get up and had difficulty in being able to bear weight due to right knee pain and swelling, she was also noted to be in A-fib with RVR with rates ranging within 150 to 160 BPM.   ED Course:  she was tachypneic, tachycardic and BP was 178/142.  Temperature 24F and O2 sat was 98% on room air.  Workup in the ED showed WBC 12.7, hemoglobin 17.7, hematocrit 50.5, MCV 94.6, platelets 179.  BMP sodium 131, potassium 3.0, chloride 98, bicarb 28, blood glucose 114, BUN 13, creatinine 0.67, ALP 146,  total bilirubin 3.1, digoxin  < 0.6, magnesium 2.0, total CK 79 Right wrist x-ray showed mild soft tissue swelling of the wrist with no acute fracture or dislocation X-ray of pelvis showed no acute displaced fracture or dislocation of the pelvis or hips Right knee x-ray was suspicious for joint effusion and show severe tricompartmental degenerative changes of the knee Chest x-ray showed no acute cardiopulmonary disease. Patient was started on IV Cardizem  drip due to A-fib with RVR.  Vancomycin  was given due to suspicion for right wrist cellulitis.  Potassium was replenished.   Paroxysmal atrial fibrillation with RVR - Heart rate 58-1 17, currently 63, mainly has been in the 60s overnight Blood pressure stable 119/57 currently, satting 94% on 2 L of oxygen  - Cardiology adjusted medications: - Metoprolol  tartarate 150 mg BID (switch from succinate),  - switch short acting diltiazem  to long acting diltiazem  120 mg once daily, -  Digoxin  0.125 mg once daily,  -  Eliquis  5 mg BID.     - 2019 Echocardiogram:Ejection fraction 60-65%, mild LVH grade 3 diastolic dysfunction - Cardiology signed off    Presumed cellulitis of the right hand Improved edema, erythema He was treated with IV vancomycin  in the ED Was on IV Ancef till 12/2223    Hypokalemia K+ 3.0 3.8, 4.1, 3.8 - Monitoring potassium and magnesium level and replacing accordingly Keeping K > 4, Mag >2    Hyponatremia Na 131, baseline at 134>>> 125 Continue IV hydration -SIADH workup, adding sodium  tablets    Essential hypertension -Continue to titrate Cardizem , on metoprolol , - Soft, but stable   Mixed hyperlipidemia- Continue Lipitor   Metabolic encephalopathy  - Transient confusion, hospital sundowning - Trend closely, reorienting -   GERD- Cont PPI   Debility: Generalized weakness with fluctuating confusion Consulting PT OT,   Consultants: Cardiology Procedures performed: Echocardiogram Disposition:  SNF Diet recommendation:  Discharge Diet Orders (From admission, onward)     Start     Ordered   01/13/24 0000  Diet - low sodium heart healthy        01/13/24 0935           Cardiac and Carb modified diet DISCHARGE MEDICATION: Allergies as of 01/14/2024       Reactions   Penicillins Other (See Comments)   Has patient had a PCN reaction causing immediate rash, facial/tongue/throat swelling, SOB or lightheadedness with hypotension: Unknown Has patient had a PCN reaction causing severe rash involving mucus membranes or skin necrosis: Unknown Has patient had a PCN reaction that required hospitalization: Unknown Has patient had a PCN reaction occurring within the last 10 years: Unknown If all of the above answers are NO, then may proceed with Cephalosporin use.   Tetanus Toxoid Swelling        Medication List     STOP taking these medications    DEEP BLUE RELIEF EX   diclofenac  Sodium 1 % Gel Commonly known as: VOLTAREN    DULoxetine 30 MG capsule Commonly known as: CYMBALTA   hydrochlorothiazide  12.5 MG capsule Commonly known as: MICROZIDE    mupirocin ointment 2 % Commonly known as: BACTROBAN   sodium chloride  0.65 % Soln nasal spray Commonly known as: OCEAN       TAKE these medications    acetaminophen  500 MG tablet Commonly known as: TYLENOL  Take 500 mg by mouth every 6 (six) hours as needed for moderate pain, fever or headache.   apixaban  5 MG Tabs tablet Commonly known as: Eliquis  Take 1 tablet (5 mg total) by mouth 2 (two) times daily. NEEDS CARDIOLOGY APPT FOR REFILLS, CALL OFFICE [phone]   atorvastatin  20 MG tablet Commonly known as: LIPITOR TAKE 1/2 TABLET BY MOUTH ONCE DAILY.   buPROPion 75 MG tablet Commonly known as: WELLBUTRIN Take 150 mg by mouth daily.   cefadroxil 500 MG capsule Commonly known as: DURICEF Take 1 capsule (500 mg total) by mouth 2 (two) times daily.   digoxin  0.125 MG tablet Commonly known as: LANOXIN  Take  1 tablet (125 mcg total) by mouth daily.   diltiazem  120 MG 24 hr capsule Commonly known as: CARDIZEM  CD Take 1 capsule (120 mg total) by mouth daily.   escitalopram  20 MG tablet Commonly known as: LEXAPRO  Take 1 tablet (20 mg total) by mouth daily.   esomeprazole 40 MG capsule Commonly known as: NEXIUM Take 40 mg by mouth daily.   levothyroxine  88 MCG tablet Commonly known as: SYNTHROID  Take 1 tablet (88 mcg total) by mouth daily.   melatonin 3 MG Tabs tablet Take 2 tablets (6 mg total) by mouth at bedtime as needed.   metoprolol  tartrate 100 MG tablet Commonly known as: LOPRESSOR  Take 1.5 tablets (150 mg total) by mouth 2 (two) times daily.   nystatin powder Commonly known as: MYCOSTATIN/NYSTOP Apply topically 2 (two) times daily. Pt uses as needed.   OVER THE COUNTER MEDICATION Place 1 drop into both ears daily. Organic Ear Oil Pt uses as needed   oxybutynin  5 MG tablet Commonly known as: DITROPAN   Take 5 mg by mouth 2 (two) times daily.   PRESERVISION AREDS 2 PO Take 2 tablets by mouth daily.   sennosides 8.8 MG/5ML syrup Commonly known as: SENOKOT Take 5 mLs by mouth 2 (two) times daily.   Vitamin D  50 MCG (2000 UT) tablet Take 2,000 Units by mouth daily.        Contact information for after-discharge care     Destination     [facility] .   Service: Skilled Nursing Contact information: [address] [facility] [place]  [address] [phone]                    Discharge Exam: Fredricka Weights   01/09/24 2115  Weight: 77.1 kg     General:  AAO x 3,  cooperative, no distress;   HEENT:  Normocephalic, PERRL, otherwise with in Normal limits   Neuro:  CNII-XII intact. , normal motor and sensation, reflexes intact   Lungs:   Clear to auscultation BL, Respirations unlabored,  No wheezes / crackles  Cardio:    S1/S2, irregularly irregular, No murmure, No Rubs or Gallops   Abdomen:  Soft, non-tender, bowel sounds active all four  quadrants, no guarding or peritoneal signs.  Muscular  skeletal:  Limited exam -global generalized weaknesses - in bed, able to move all 4 extremities,   2+ pulses,  symmetric, No pitting edema  Skin:  Dry, warm to touch, negative for any Rashes,  Wounds: Please see nursing documentation       Condition at discharge: good  The results of significant diagnostics from this hospitalization (including imaging, microbiology, ancillary and laboratory) are listed below for reference.   Imaging Studies: DG Wrist Complete Right Result Date: 01/09/2024 EXAM: 3 or more VIEW(S) XRAY OF THE WRIST 01/09/2024 11:18:54 PM COMPARISON: X-rays 08/22/2022. CLINICAL HISTORY: Trauma, swelling. Patient from home alone brought in by [facility]. Complains of falling yesterday and falling on her knees. Right knee notably swollen. EMS states patient was lying in bed when they arrived. Her back was on the bed and her legs were supported by a chair. Family stated they got her up but patient states she got up on her own. Patient is confused, stated president is Zachary Neighbors. Patient not in pain, just uncomfortable. Patient in Afib in triage. EMS states she is on Eliquis . FINDINGS: BONES AND JOINTS: Degenerative changes of the first [facility] and STT joints. Chondrocalcinosis of the TFCC. Chronic scapholunate widening suggesting ligamentous injury. No acute fracture. No focal osseous lesion. No joint dislocation. SOFT TISSUES: Mild soft tissue swelling of the wrist. IMPRESSION: 1. No acute fracture or dislocation. Electronically signed by: Norman Gatlin MD 01/09/2024 11:28 PM EDT RP Workstation: [id number]   DG Hand Complete Right Result Date: 01/09/2024 EXAM: 3 OR MORE VIEW(S) XRAY OF THE HAND 01/09/2024 11:18:54 PM COMPARISON: None available. CLINICAL HISTORY: Trauma and swelling. Patient reports falling yesterday, resulting in right knee swelling. EMS found the patient in bed. Patient is confused, reports no pain but discomfort, and  is in Afib, on Eliquis . FINDINGS: BONES AND JOINTS: No acute fracture. No focal osseous lesion. No joint dislocation. Severe degenerative changes about the hand most pronounced at the interphalangeal joints. SOFT TISSUES: Soft tissue swelling about the index finger. Vascular calcifications. IMPRESSION: 1. Severe degenerative changes of the hand, most pronounced at the interphalangeal joints. 2. No acute osseous abnormality. Electronically signed by: Norman Gatlin MD 01/09/2024 11:27 PM EDT RP Workstation: [id number]   DG Chest 1 View Result Date: 01/09/2024  EXAM: 1 VIEW(S) XRAY OF THE CHEST 01/09/2024 10:31:00 PM COMPARISON: Chest x-ray 07/21/2016. CLINICAL HISTORY: 809823 Fall 190176. Family stated ; they got her up but pt states she got up on her own. Pt is confused stated president in [place]. Pt not in pain. Just uncomfortable. Pt in Afib in triage. FINDINGS: LUNGS AND PLEURA: Chronic coarsened interstitial markings. No overt pulmonary edema. No pleural effusion. No pneumothorax. HEART AND MEDIASTINUM: Unchanged cardiomediastinal silhouette. Cardiomegaly. Atherosclerotic plaque. Left chest wall daily pacemaker. BONES AND SOFT TISSUES: No acute osseous abnormality. IMPRESSION: 1. No acute cardiopulmonary disease. Electronically signed by: Morgane Naveau MD 01/09/2024 10:40 PM EDT RP Workstation: [id number]   DG Pelvis 1-2 Views Result Date: 01/09/2024 EXAM: 1 or 2 VIEW(S) XRAY OF THE PELVIS 01/09/2024 10:31:00 PM COMPARISON: None available. CLINICAL HISTORY: 809823 Fall 190176. Pt from home alone brought in my [facility]. Complains of falling yesterday and falling on her knees. R knee notably swollen. EMS states patient was lying in bed when they arrived. Her back was on the bed and her legs were supported by a chair. Family stated ; they got her up but pt states she got up on her own. Pt is confused stated president in [place]. Pt not in pain. Just uncomfortable. Pt in Afib in triage. EMS states she  is on Eliquis .; Best images obtainable due to pt condition FINDINGS: BONES AND JOINTS: Left sacroiliac joint sclerosis and partial syndesmosis. No diastasis of the pelvis. No acute displaced fracture of the bones of the pelvis. No acute displaced fracture or dislocation of either hips on frontal view. Degenerative changes in the visualized lower lumbar spine. SOFT TISSUES: The soft tissues are unremarkable. IMPRESSION: 1. No acute displaced fracture or dislocation of the pelvis or hips. 2. Left sacroiliac joint sclerosis and partial syndesmosis. Electronically signed by: Morgane Naveau MD 01/09/2024 10:37 PM EDT RP Workstation: [id number]   DG Knee Complete 4 Views Right Result Date: 01/09/2024 EXAM: 4 OR MORE VIEW(S) XRAY OF THE RIGHT KNEE 01/09/2024 10:31:00 PM COMPARISON: None available. CLINICAL HISTORY: Fall 190176. Pt from home alone brought in my [facility]. Complains of falling yesterday and falling on her knees. R knee notably swollen. EMS states patient was lying in bed when they arrived. Her back was on the bed and her legs were supported by a chair. Family stated ; they got her up but pt states she got up on her own. Pt is confused stated president in [place]. Pt not in pain. Just uncomfortable. Pt in Afib in triage. EMS states she is on Eliquis .; Best images obtainable due to pt condition FINDINGS: BONES AND JOINTS: No acute fracture. No focal osseous lesion. No joint dislocation. Question joint effusion with limited evaluation on this incomplete lateral view. Severe tricompartmental degenerative changes of the knee. SOFT TISSUES: The soft tissues are unremarkable. IMPRESSION: 1. Question joint effusion with limited evaluation on this incomplete lateral view. 2. Severe tricompartmental degenerative changes of the knee. Electronically signed by: Morgane Naveau MD 01/09/2024 10:35 PM EDT RP Workstation: [id number]   DG Lumbar Spine Complete Result Date: 01/09/2024 EXAM: 4 VIEW(S) XRAY OF THE LUMBAR  SPINE 01/09/2024 10:31:00 PM COMPARISON: MRI of the lumbar spine 06/19/2014. CLINICAL HISTORY: Trauma, fall, and atrial fibrillation. Patient complains of falling yesterday and falling on her knees, with notable right knee swelling. EMS found the patient lying in bed with her back on the bed and legs supported by a chair. Patient is confused, reports no pain but discomfort, and is in atrial  fibrillation, reportedly on Eliquis . Images were best obtainable due to patient condition. FINDINGS: LUMBAR SPINE: BONES: No acute fracture. No aggressive appearing osseous lesion. Alignment is normal. DISCS AND DEGENERATIVE CHANGES: Multilevel advanced spondylosis, disc space height loss, and degenerative endplate changes. Multilevel facet arthropathy most advanced in the lower lumbar spine. SOFT TISSUES: No acute abnormality. IMPRESSION: 1. No acute osseous abnormality of the lumbar spine. 2. Multilevel advanced spondylosis . Electronically signed by: Norman Gatlin MD 01/09/2024 10:34 PM EDT RP Workstation: [id number]   CT Head Wo Contrast Result Date: 01/09/2024 EXAM: CT HEAD WITHOUT CONTRAST 01/09/2024 10:06:07 PM TECHNIQUE: CT of the head was performed without the administration of intravenous contrast. Automated exposure control, iterative reconstruction, and/or weight based adjustment of the mA/kV was utilized to reduce the radiation dose to as low as reasonably achievable. COMPARISON: Comparison with 03/24/216. CLINICAL HISTORY: Facial trauma, blunt. Fall. Patient reports falling yesterday and falling on her knees. Right knee is notably swollen. Patient was found lying in bed with legs supported by a chair. Patient is in atrial fibrillation (Afib) in triage and is on Eliquis . FINDINGS: BRAIN AND VENTRICLES: No acute hemorrhage. No evidence of acute infarct. No hydrocephalus. No extra-axial collection. No mass effect or midline shift. Nonspecific periventricular and subcortical white matter hypoattenuation, likely  sequela of chronic microvascular ischemic change. Mild age-related parenchymal volume loss. ORBITS: Bilateral lens replacement. SINUSES: No acute abnormality. SOFT TISSUES AND SKULL: No acute soft tissue abnormality. No skull fracture. IMPRESSION: 1. No acute intracranial abnormality. Electronically signed by: Norman Gatlin MD 01/09/2024 10:09 PM EDT RP Workstation: [id number]    Microbiology: Results for orders placed or performed during the hospital encounter of 01/09/24  MRSA Next Gen by PCR, Nasal     Status: None   Collection Time: 01/10/24  1:09 AM   Specimen: Nasal Mucosa; Nasal Swab  Result Value Ref Range Status   MRSA by PCR Next Gen NOT DETECTED NOT DETECTED Final    Comment: (NOTE) The GeneXpert MRSA Assay (FDA approved for NASAL specimens only), is one component of a comprehensive MRSA colonization surveillance program. It is not intended to diagnose MRSA infection nor to guide or monitor treatment for MRSA infections. Test performance is not FDA approved in patients less than 56 years old. Performed at [facility], [address], [place], [place] [address]     Labs: CBC: Recent Labs  Lab 01/09/24 2140 01/11/24 0442 01/12/24 0415 01/13/24 0411 01/14/24 0445  WBC 12.7* 9.6 7.3 6.1 5.7  NEUTROABS 9.5*  --   --   --   --   HGB 17.7* 14.6 14.6 14.6 14.8  HCT 52.5* 43.3 43.7 42.7 45.1  MCV 91.6 90.8 93.0 92.2 93.6  PLT 179 167 182 183 214   Basic Metabolic Panel: Recent Labs  Lab 01/09/24 2140 01/10/24 1724 01/11/24 0442 01/12/24 0415 01/13/24 0411  NA 131*  --  125* 131* 133*  K 3.0* 3.8 4.1 3.8 4.0  CL 90*  --  91* 97* 99  CO2 28  --  23 24 25   GLUCOSE 114*  --  93 88 93  BUN 13  --  22 20 19   CREATININE 0.67  --  0.78 0.71 0.73  CALCIUM  9.4  --  8.3* 8.4* 8.3*  MG 2.0 1.9  --   --   --    Liver Function Tests: Recent Labs  Lab 01/09/24 2140 01/11/24 0442 01/12/24 0415 01/13/24 0411  AST 30 25 32 85*  ALT 18 9 7  18  ALKPHOS 146* 116 115 149*   BILITOT 3.1* 2.0* 1.2 1.0  PROT 7.9 6.2* 6.1* 5.9*  ALBUMIN 3.8 2.9* 2.9* 2.9*   CBG: No results for input(s): GLUCAP in the last 168 hours.  Discharge time spent: greater than 30 minutes.  Signed: Adriana DELENA Grams, MD [facility] 01/14/2024

## 2024-01-13 NOTE — Progress Notes (Signed)
 Mobility Specialist Progress Note:    01/13/24 1400  Mobility  Activity Pivoted/transferred from chair to bed  Level of Assistance Moderate assist, patient does 50-74% (+2)  Assistive Device None  Distance Ambulated (ft) 3 ft  Range of Motion/Exercises Active;All extremities  Activity Response Tolerated well  Mobility Referral Yes  Mobility visit 1 Mobility  Mobility Specialist Start Time (ACUTE ONLY) 1400  Mobility Specialist Stop Time (ACUTE ONLY) 1420  Mobility Specialist Time Calculation (min) (ACUTE ONLY) 20 min   Pt received in bed, agreeable to mobility. Required ModA+2 to stand and transfer with no AD. Tolerated well, NT in room. All needs met.  Mosetta Ferdinand Mobility Specialist Please contact via Special educational needs teacher or  Rehab office at [phone]

## 2024-01-13 NOTE — Progress Notes (Signed)
 PROGRESS NOTE    Patient: Denise Pugh                            PCP: Shona Norleen PEDLAR, MD                    DOB: [birth year]            DOA: 01/09/2024 FMW:[id number]             DOS: 01/13/2024, 9:56 AM   LOS: 3 days   Date of Service: The patient was seen and examined on 01/13/2024  Subjective:   The patient was seen and examined this morning, stable no acute distress, heart rate 63-70, currently at 70 Blood pressure stable at 126/74, satting 92% on room air No issues overnight Complaining of generalized weaknesses   Brief Narrative:   GEORGEANNA RADZIEWICZ is a 89 y.o. female with medical history significant of persistent atrial fibrillation, hypothyroidism, hyperlipidemia, GERD, s/p pacemaker, anxiety who presents to the emergency department via EMS due to fall sustained at home.  Patient did not remember when and how she fell, some of the history was obtained from daughter at bedside, apparently she fell on her knees and presents with swollen right knee. Family checked on her and find her on the bed with legs being supported by a chair, EMS was activated and on arrival of EMS team, she was helped to get up and had difficulty in being able to bear weight due to right knee pain and swelling, she was also noted to be in A-fib with RVR with rates ranging within 150 to 160 BPM.   ED Course:  she was tachypneic, tachycardic and BP was 178/142.  Temperature 80F and O2 sat was 98% on room air.  Workup in the ED showed WBC 12.7, hemoglobin 17.7, hematocrit 50.5, MCV 94.6, platelets 179.  BMP sodium 131, potassium 3.0, chloride 98, bicarb 28, blood glucose 114, BUN 13, creatinine 0.67, ALP 146, total bilirubin 3.1, digoxin  < 0.6, magnesium 2.0, total CK 79 Right wrist x-ray showed mild soft tissue swelling of the wrist with no acute fracture or dislocation X-ray of pelvis showed no acute displaced fracture or dislocation of the pelvis or hips Right knee x-ray was suspicious for joint effusion and show  severe tricompartmental degenerative changes of the knee Chest x-ray showed no acute cardiopulmonary disease. Patient was started on IV Cardizem  drip due to A-fib with RVR.  Vancomycin  was given due to suspicion for right wrist cellulitis.  Potassium was replenished.   Assessment & Plan:   Principal Problem:   Paroxysmal atrial fibrillation with RVR (HCC) Active Problems:   Essential hypertension   GERD (gastroesophageal reflux disease)   Mixed hyperlipidemia   Cellulitis of right hand   Hypokalemia   Hyponatremia   Leukocytosis   Paroxysmal atrial fibrillation with RVR - Heart rate much improved, 63-70, currently at 70, blood pressure 126/74, satting 92% on room air   - Cardiology adjusted medications: - Metoprolol  tartarate 150 mg BID (switch from succinate),  - switch short acting diltiazem  to long acting diltiazem  120 mg once daily, -  Digoxin  0.125 mg once daily,  -  Eliquis  5 mg BID.   No further changes  - 2019 Echocardiogram:Ejection fraction 60-65%, mild LVH grade 3 diastolic dysfunction - Cardiology signed off    Presumed cellulitis of the right hand Improved edema, erythema He was treated with IV vancomycin  in the ED  Was on IV Ancef t    Hypokalemia K+ 3.0 3.8, 4.1, 3.8, 4.0 - Monitoring potassium and magnesium level and replacing accordingly Keeping K > 4, Mag >2    Hyponatremia Na 131, baseline at 134>>> 125, 133      Essential hypertension -Continue to titrate Cardizem , on metoprolol , - Stable   Mixed hyperlipidemia- Continue Lipitor   Metabolic encephalopathy  - Transient confusion, hospital sundowning - Trend closely, reorienting -   GERD- Cont PPI   Debility: Generalized weakness with fluctuating confusion Consulting PT OT >>> recommending SNF placement TOC consulted pending approval and  arrangement      ----------------------------------------------------------------------------------------------------------------------------------------------- Nutritional status:  The patient's BMI is: Body mass index is 30.11 kg/m. I agree with the assessment and plan as outlined  ------------------------------------------------------------------------------------------------------------------------------------------------  DVT prophylaxis:  Place and maintain sequential compression device Start: 01/11/24 1600 apixaban  (ELIQUIS ) tablet 5 mg   Code Status:   Code Status: Limited: Do not attempt resuscitation (DNR) -DNR-LIMITED -Do Not Intubate/DNI   Family Communication: No family member present at bedside-  -Advance care planning has been discussed.   Admission status:   Status is: Observation The patient remains OBS appropriate and will d/c before 2 midnights.   Disposition: From  - home             Planning for discharge in 1-2 days   Procedures:   No admission procedures for hospital encounter.   Antimicrobials:  Anti-infectives (From admission, onward)    Start     Dose/Rate Route Frequency Ordered Stop   01/10/24 0715  ceFAZolin (ANCEF) IVPB 2g/100 mL premix        2 g 200 mL/hr over 30 Minutes Intravenous Every 8 hours 01/10/24 0627 01/17/24 0559   01/09/24 2300  vancomycin  (VANCOCIN ) IVPB 1000 mg/200 mL premix        1,000 mg 200 mL/hr over 60 Minutes Intravenous  Once 01/09/24 2257 01/10/24 0100        Medication:   apixaban   5 mg Oral BID   atorvastatin   10 mg Oral Daily   buPROPion  150 mg Oral Daily   Chlorhexidine  Gluconate Cloth  6 each Topical Daily   cholecalciferol   2,000 Units Oral Daily   digoxin   125 mcg Oral Daily   diltiazem   120 mg Oral Daily   escitalopram   20 mg Oral Daily   feeding supplement  237 mL Oral BID BM   haloperidol lactate  2 mg Intramuscular Once   levothyroxine   88 mcg Oral Daily   magnesium oxide  200 mg Oral  Daily   metoprolol  tartrate  150 mg Oral BID   pantoprazole   40 mg Oral Q1200   polyethylene glycol  17 g Oral Daily   sennosides  5 mL Oral BID   sodium chloride   1 g Oral BID WC    acetaminophen , lip balm, melatonin, nystatin, mouth rinse, simethicone   Objective:   Vitals:   01/12/24 1600 01/12/24 2052 01/13/24 0021 01/13/24 0423  BP: (!) 152/69 125/60 (!) 120/58 126/74  Pulse: 65 63 66 70  Resp: 18 18 18 18   Temp: 98 F (36.7 C) 97.9 F (36.6 C) 98 F (36.7 C) 98 F (36.7 C)  TempSrc: Oral Oral Oral   SpO2: 97% 91% 94% 92%  Weight:      Height:        Intake/Output Summary (Last 24 hours) at 01/13/2024 0956 Last data filed at 01/13/2024 0556 Gross per 24 hour  Intake 240 ml  Output 300 ml  Net -60 ml   Filed Weights   01/09/24 2115  Weight: 77.1 kg     Physical examination:         General:  AAO x 2,  cooperative, no distress;   HEENT:  Normocephalic, PERRL, otherwise with in Normal limits   Neuro:  CNII-XII intact. , normal motor and sensation, reflexes intact   Lungs:   Clear to auscultation BL, Respirations unlabored,  No wheezes / crackles  Cardio:    S1/S2, RRR, No murmure, No Rubs or Gallops   Abdomen:  Soft, non-tender, bowel sounds active all four quadrants, no guarding or peritoneal signs.  Muscular  skeletal:  Limited exam -sever global generalized weaknesses - in bed, able to move all 4 extremities,   2+ pulses,  symmetric, No pitting edema  Skin:  Dry, warm to touch, negative for any Rashes,  Wounds: Please see nursing documentation      -----------------------------------------------------------------------------------------------------------------------------------------    LABs:     Latest Ref Rng & Units 01/13/2024    4:11 AM 01/12/2024    4:15 AM 01/11/2024    4:42 AM  CBC  WBC 4.0 - 10.5 K/uL 6.1  7.3  9.6   Hemoglobin 12.0 - 15.0 g/dL 85.3  85.3  85.3   Hematocrit 36.0 - 46.0 % 42.7  43.7  43.3   Platelets 150 - 400  K/uL 183  182  167       Latest Ref Rng & Units 01/13/2024    4:11 AM 01/12/2024    4:15 AM 01/11/2024    4:42 AM  CMP  Glucose 70 - 99 mg/dL 93  88  93   BUN 8 - 23 mg/dL 19  20  22    Creatinine 0.44 - 1.00 mg/dL 9.26  9.28  9.21   Sodium 135 - 145 mmol/L 133  131  125   Potassium 3.5 - 5.1 mmol/L 4.0  3.8  4.1   Chloride 98 - 111 mmol/L 99  97  91   CO2 22 - 32 mmol/L 25  24  23    Calcium  8.9 - 10.3 mg/dL 8.3  8.4  8.3   Total Protein 6.5 - 8.1 g/dL 5.9  6.1  6.2   Total Bilirubin 0.0 - 1.2 mg/dL 1.0  1.2  2.0   Alkaline Phos 38 - 126 U/L 149  115  116   AST 15 - 41 U/L 85  32  25   ALT 0 - 44 U/L 18  7  9         Micro Results Recent Results (from the past 240 hours)  MRSA Next Gen by PCR, Nasal     Status: None   Collection Time: 01/10/24  1:09 AM   Specimen: Nasal Mucosa; Nasal Swab  Result Value Ref Range Status   MRSA by PCR Next Gen NOT DETECTED NOT DETECTED Final    Comment: (NOTE) The GeneXpert MRSA Assay (FDA approved for NASAL specimens only), is one component of a comprehensive MRSA colonization surveillance program. It is not intended to diagnose MRSA infection nor to guide or monitor treatment for MRSA infections. Test performance is not FDA approved in patients less than 4 years old. Performed at [facility], [address] [place], [place] [address]     Radiology Reports No results found.   SIGNED: Adriana DELENA Grams, MD, FHM. FAAFP. Jolynn Pack - [facility] Critical care time spent - 55 min.  In seeing, evaluating and examining the patient.  Reviewing medical records, labs, drawn plan of care. [facility],  Pager (please use [email or web address] to page/ text) Please use Epic Secure Chat for non-urgent communication (7AM-7PM)  If 7PM-7AM, please contact night-coverage [email or web address], 01/13/2024, 9:56 AM

## 2024-01-14 DIAGNOSIS — I480 Paroxysmal atrial fibrillation: Secondary | ICD-10-CM | POA: Diagnosis not present

## 2024-01-14 LAB — CBC
HCT: 45.1 % (ref 36.0–46.0)
Hemoglobin: 14.8 g/dL (ref 12.0–15.0)
MCH: 30.7 pg (ref 26.0–34.0)
MCHC: 32.8 g/dL (ref 30.0–36.0)
MCV: 93.6 fL (ref 80.0–100.0)
Platelets: 214 K/uL (ref 150–400)
RBC: 4.82 MIL/uL (ref 3.87–5.11)
RDW: 14.3 % (ref 11.5–15.5)
WBC: 5.7 K/uL (ref 4.0–10.5)
nRBC: 0 % (ref 0.0–0.2)

## 2024-01-14 NOTE — TOC Transition Note (Signed)
 Transition of Care [facility]) - Discharge Note   Patient Details  Name: Denise Pugh MRN: [id number] Date of Birth: 09-25-1928  Transition of Care [facility]) CM/SW Contact:  Sharlyne Stabs, RN Phone Number: 01/14/2024, 11:10 AM   Clinical Narrative:   [name] received for [name].  [name] provided a room number, RN calling report, DC summary sent in the hub. IPCM calling to schedule EMS. [name]( Daughter) updated.    Final next level of care: Skilled Nursing Facility Barriers to Discharge: Barriers Resolved   Patient Goals and CMS Choice Patient states their goals for this hospitalization and ongoing recovery are:: Agreeable to SNF CMS Medicare.gov Compare Post Acute Care list provided to:: Patient Choice offered to / list presented to : [name] ownership interest in [facility].provided to:: Patient    Discharge Placement                Patient to be transferred to facility by: EMS Name of family member notified: [name] Patient and family notified of of transfer: 01/14/24  Discharge Plan and Services Additional resources added to the After Visit Summary for       Social Drivers of Health (SDOH) Interventions SDOH Screenings   Food Insecurity: No Food Insecurity (01/10/2024)  Housing: Low Risk  (01/10/2024)  Transportation Needs: No Transportation Needs (01/10/2024)  Utilities: Not At Risk (01/10/2024)  Social Connections: Socially Isolated (01/10/2024)  Tobacco Use: Low Risk  (10/15/2022)

## 2024-01-14 NOTE — Care Management Important Message (Signed)
 Important Message  Patient Details  Name: Denise Pugh MRN: [id number] Date of Birth: 12-30-1928   Important Message Given:  Yes - Medicare IM     Khylon Davies L Leanette Eutsler 01/14/2024, 11:17 AM

## 2024-01-14 NOTE — Plan of Care (Signed)

## 2024-01-15 LAB — CBC
HCT: 43.9 % (ref 36.0–46.0)
Hemoglobin: 14.4 g/dL (ref 12.0–15.0)
MCH: 30.8 pg (ref 26.0–34.0)
MCHC: 32.8 g/dL (ref 30.0–36.0)
MCV: 94 fL (ref 80.0–100.0)
Platelets: 221 K/uL (ref 150–400)
RBC: 4.67 MIL/uL (ref 3.87–5.11)
RDW: 14.3 % (ref 11.5–15.5)
WBC: 6.7 K/uL (ref 4.0–10.5)
nRBC: 0 % (ref 0.0–0.2)

## 2024-01-15 NOTE — Progress Notes (Signed)
 Today, patient was seen and examined at bedside.  Denies interval complaints.  Vitals reviewed and patient examined.  No interval changes.  Appears to be stable for discharge to skilled nursing facility..  Please refer to the discharge instructions made on 01/14/2024 for details.

## 2024-01-15 NOTE — Discharge Planning (Signed)
-----------------------------------------------------------  [facility]--------------------------------------------------- D(Data) A(Action) R(response)     Data: Patient planned for DC yesterday back to [facility]. Still IP as of rounds this AM.    Action: Followed up with Primary Nurse Brittany LPN which stated pt declined EMS transport yesterday around lunch, then returned around 11PM which was too late for to return to [facility].    Response: Reached out to [employer] about patient. Laverne working on getting DC Summary updated by provider and medical necessity.     ALEC Lim, RN [facility]

## 2024-01-15 NOTE — Plan of Care (Signed)

## 2024-01-18 ENCOUNTER — Ambulatory Visit: Payer: Medicare HMO

## 2024-02-27 ENCOUNTER — Other Ambulatory Visit: Payer: Self-pay

## 2024-02-27 ENCOUNTER — Emergency Department (HOSPITAL_COMMUNITY)
Admission: EM | Admit: 2024-02-27 | Discharge: 2024-02-27 | Disposition: A | Discharge status: Skilled Nursing Facility | Attending: Emergency Medicine | Admitting: Emergency Medicine

## 2024-02-27 ENCOUNTER — Emergency Department (HOSPITAL_COMMUNITY)

## 2024-02-27 ENCOUNTER — Encounter (HOSPITAL_COMMUNITY): Payer: Self-pay

## 2024-02-27 DIAGNOSIS — R404 Transient alteration of awareness: Secondary | ICD-10-CM

## 2024-02-27 DIAGNOSIS — E876 Hypokalemia: Secondary | ICD-10-CM

## 2024-02-27 LAB — CBC WITH DIFFERENTIAL/PLATELET
Abs Immature Granulocytes: 0.03 K/uL (ref 0.00–0.07)
Basophils Absolute: 0 K/uL (ref 0.0–0.1)
Basophils Relative: 1 %
Eosinophils Absolute: 0.2 K/uL (ref 0.0–0.5)
Eosinophils Relative: 2 %
HCT: 42.6 % (ref 36.0–46.0)
Hemoglobin: 14.3 g/dL (ref 12.0–15.0)
Immature Granulocytes: 0 %
Lymphocytes Relative: 24 %
Lymphs Abs: 2 K/uL (ref 0.7–4.0)
MCH: 30.8 pg (ref 26.0–34.0)
MCHC: 33.6 g/dL (ref 30.0–36.0)
MCV: 91.6 fL (ref 80.0–100.0)
Monocytes Absolute: 1.1 K/uL — ABNORMAL HIGH (ref 0.1–1.0)
Monocytes Relative: 13 %
Neutro Abs: 5.1 K/uL (ref 1.7–7.7)
Neutrophils Relative %: 60 %
Platelets: 209 K/uL (ref 150–400)
RBC: 4.65 MIL/uL (ref 3.87–5.11)
RDW: 14.4 % (ref 11.5–15.5)
WBC: 8.3 K/uL (ref 4.0–10.5)
nRBC: 0 % (ref 0.0–0.2)

## 2024-02-27 LAB — URINALYSIS, ROUTINE W REFLEX MICROSCOPIC
Bacteria, UA: NONE SEEN
Bilirubin Urine: NEGATIVE
Glucose, UA: NEGATIVE mg/dL
Hgb urine dipstick: NEGATIVE
Ketones, ur: NEGATIVE mg/dL
Nitrite: NEGATIVE
Protein, ur: NEGATIVE mg/dL
Specific Gravity, Urine: 1.009 (ref 1.005–1.030)
pH: 7 (ref 5.0–8.0)

## 2024-02-27 LAB — COMPREHENSIVE METABOLIC PANEL WITH GFR
ALT: 12 U/L (ref 0–44)
AST: 25 U/L (ref 15–41)
Albumin: 3.3 g/dL — ABNORMAL LOW (ref 3.5–5.0)
Alkaline Phosphatase: 122 U/L (ref 38–126)
Anion gap: 5 (ref 5–15)
BUN: <5 mg/dL — ABNORMAL LOW (ref 8–23)
CO2: 35 mmol/L — ABNORMAL HIGH (ref 22–32)
Calcium: 8.6 mg/dL — ABNORMAL LOW (ref 8.9–10.3)
Chloride: 91 mmol/L — ABNORMAL LOW (ref 98–111)
Creatinine, Ser: 0.58 mg/dL (ref 0.44–1.00)
GFR, Estimated: >60 mL/min (ref >60)
Glucose, Bld: 84 mg/dL (ref 70–99)
Potassium: 2.7 mmol/L — CL (ref 3.5–5.1)
Sodium: 131 mmol/L — ABNORMAL LOW (ref 135–145)
Total Bilirubin: 1.2 mg/dL (ref 0.0–1.2)
Total Protein: 6.2 g/dL — ABNORMAL LOW (ref 6.5–8.1)

## 2024-02-27 LAB — TROPONIN T, HIGH SENSITIVITY: Troponin T High Sensitivity: 38 ng/L — ABNORMAL HIGH (ref 0–19)

## 2024-02-27 LAB — CBG MONITORING, ED: Glucose-Capillary: 90 mg/dL (ref 70–99)

## 2024-02-27 LAB — DIGOXIN LEVEL: Digoxin Level: 1.8 ng/mL (ref 0.8–2.0)

## 2024-02-27 MED ORDER — POTASSIUM CHLORIDE CRYS ER 10 MEQ PO TBCR: 10.0000 meq | EXTENDED_RELEASE_TABLET | Freq: Every day | ORAL | 0 refills | Status: DC

## 2024-02-27 MED ORDER — POTASSIUM CHLORIDE CRYS ER 20 MEQ PO TBCR
40.0000 meq | EXTENDED_RELEASE_TABLET | Freq: Once | ORAL | Status: AC
  Administered 2024-02-27: 40 meq via ORAL
  Filled 2024-02-27: qty 2

## 2024-02-27 MED ORDER — POTASSIUM CHLORIDE 10 MEQ/100ML IV SOLN
10.0000 meq | Freq: Once | INTRAVENOUS | Status: AC
  Administered 2024-02-27: 10 meq via INTRAVENOUS
  Filled 2024-02-27: qty 100

## 2024-02-27 NOTE — ED Provider Notes (Signed)
 Denise Pugh Provider Note   CSN: [id number] Arrival date & time: 02/27/24  0830     Patient presents with: Altered Mental Status   Denise Pugh is a 89 y.o. female.    Altered Mental Status  89 year old female presenting by EMS for altered mental status.  Patient currently is at a [facility] assisted living facility.  Staff states that upon waking this morning she seemed to be a little bit altered.  They report that the patient told them that there was something in there her urine.  When staff went to look they did not see anything in her urine.  Patient was diagnosed with a UTI and has taken 3 doses so far.  Patient is able to answer who she is and where she is at but unable to remember what year it is.  Patient denies any pain anywhere.  She denies any dysuria or hematuria.  She reports her last bowel movement was last night.  Reports that she has been eating well. After speaking with daughter she reports that her mom can be combative at times.  And that she is not a morning person and can be sometimes confused and combative if woken up early in the morning.    Prior to Admission medications   Medication Sig Start Date End Date Taking? Authorizing Provider  potassium chloride  (KLOR-CON  M) 10 MEQ tablet Take 1 tablet (10 mEq total) by mouth daily. 02/27/24  Yes Rosaline Almarie MATSU, PA-C  acetaminophen  (TYLENOL ) 500 MG tablet Take 500 mg by mouth every 6 (six) hours as needed for moderate pain, fever or headache.    [provider]  apixaban  (ELIQUIS ) 5 MG TABS tablet Take 1 tablet (5 mg total) by mouth 2 (two) times daily. NEEDS CARDIOLOGY APPT FOR REFILLS, CALL OFFICE [phone] 12/27/23   Croitoru, Mihai, MD  atorvastatin  (LIPITOR) 20 MG tablet TAKE 1/2 TABLET BY MOUTH ONCE DAILY. 03/02/23   Burnard Debby LABOR, MD  buPROPion  (WELLBUTRIN ) 75 MG tablet Take 150 mg by mouth daily. 10/07/20   [provider]  cefadroxil  (DURICEF)  500 MG capsule Take 1 capsule (500 mg total) by mouth 2 (two) times daily. 01/13/24   Shahmehdi, Adriana LABOR, MD  Cholecalciferol  (VITAMIN D ) 2000 UNITS tablet Take 2,000 Units by mouth daily.    [provider]  digoxin  (LANOXIN ) 0.125 MG tablet Take 1 tablet (125 mcg total) by mouth daily. 01/13/24   Shahmehdi, Adriana LABOR, MD  diltiazem  (CARDIZEM  CD) 120 MG 24 hr capsule Take 1 capsule (120 mg total) by mouth daily. 01/14/24 02/13/24  Willette Adriana LABOR, MD  escitalopram  (LEXAPRO ) 20 MG tablet Take 1 tablet (20 mg total) by mouth daily. 01/13/24   Shahmehdi, Seyed A, MD  esomeprazole (NEXIUM) 40 MG capsule Take 40 mg by mouth daily.    [provider]  levothyroxine  (SYNTHROID ) 88 MCG tablet Take 1 tablet (88 mcg total) by mouth daily. 01/13/24   Shahmehdi, Adriana LABOR, MD  melatonin 3 MG TABS tablet Take 2 tablets (6 mg total) by mouth at bedtime as needed. 01/13/24   Shahmehdi, Adriana LABOR, MD  metoprolol  tartrate (LOPRESSOR ) 100 MG tablet Take 1.5 tablets (150 mg total) by mouth 2 (two) times daily. 07/26/23   Croitoru, Mihai, MD  Multiple Vitamins-Minerals (PRESERVISION AREDS 2 PO) Take 2 tablets by mouth daily.    [provider]  nystatin  (MYCOSTATIN /NYSTOP ) powder Apply topically 2 (two) times daily. Pt uses as needed. Patient not taking: Reported  on 10/15/2022 09/21/19   [provider]  OVER THE COUNTER MEDICATION Place 1 drop into both ears daily. Organic Ear Oil Pt uses as needed Patient not taking: Reported on 10/15/2022    [provider]  oxybutynin  (DITROPAN ) 5 MG tablet Take 5 mg by mouth 2 (two) times daily.  01/21/15   [provider]  sennosides (SENOKOT) 8.8 MG/5ML syrup Take 5 mLs by mouth 2 (two) times daily. 01/13/24   Willette Adriana LABOR, MD    Allergies: Penicillins and Tetanus toxoid    Review of Systems  Updated Vital Signs BP (!) 170/69   Pulse 88   Temp 98 F (36.7 C) (Oral)   Resp (!) 30   Ht 5' 3 (1.6 m)   SpO2 96%   BMI  30.11 kg/m   Physical Exam  (all labs ordered are listed, but only abnormal results are displayed) Labs Reviewed  URINALYSIS, ROUTINE W REFLEX MICROSCOPIC - Abnormal; Notable for the following components:      Result Value   Color, Urine AMBER (*)    Leukocytes,Ua TRACE (*)    All other components within normal limits  COMPREHENSIVE METABOLIC PANEL WITH GFR - Abnormal; Notable for the following components:   Sodium 131 (*)    Potassium 2.7 (*)    Chloride 91 (*)    CO2 35 (*)    BUN <5 (*)    Calcium  8.6 (*)    Total Protein 6.2 (*)    Albumin 3.3 (*)    All other components within normal limits  CBC WITH DIFFERENTIAL/PLATELET - Abnormal; Notable for the following components:   Monocytes Absolute 1.1 (*)    All other components within normal limits  TROPONIN T, HIGH SENSITIVITY - Abnormal; Notable for the following components:   Troponin T High Sensitivity 38 (*)    All other components within normal limits  DIGOXIN  LEVEL  CBG MONITORING, ED    EKG: EKG Interpretation Date/Time:  Sunday February 27 2024 12:09:54 EST Ventricular Rate:  76 PR Interval:  44 QRS Duration:  173 QT Interval:  464 QTC Calculation: 522 R Axis:   -81  Text Interpretation: Sinus rhythm Ventricular tachycardia, unsustained Short PR interval LVH with IVCD, LAD and secondary repol abnrm Inferior infarct, acute (RCA) Anterolateral Q waves, probably due to LVH Lateral leads are also involved Prolonged QT interval Probable RV involvement, suggest recording right precordial leads very abnormal QRS - wide with paced rhythm Confirmed by Cleotilde Rogue ([id number]) on 02/27/2024 2:32:23 PM  Radiology: DG Chest Portable 1 View Result Date: 02/27/2024 CLINICAL DATA:  AMS. EXAM: PORTABLE CHEST 1 VIEW COMPARISON:  01/09/2024. FINDINGS: The heart size and mediastinal contours are unchanged. Aortic atherosclerosis. Chronic interstitial coarsening. No acute airspace consolidation, sizeable pleural effusion, or  pneumothorax. No acute osseous abnormality. IMPRESSION: No acute cardiopulmonary findings. Electronically Signed   By: Harrietta Sherry M.D.   On: 02/27/2024 10:14     Procedures   Medications Ordered in the ED  potassium chloride  10 mEq in 100 mL IVPB (0 mEq Intravenous Stopped 02/27/24 1203)  potassium chloride  SA (KLOR-CON  M) CR tablet 40 mEq (40 mEq Oral Given 02/27/24 1213)                                    Medical Decision Making Amount and/or Complexity of Data Reviewed Labs: ordered. Radiology: ordered. ECG/medicine tests: ordered.  Risk Prescription drug management.  Impression: 89 year old female presenting with altered mental status.  Differentials include UTI, stroke, dementia, sepsis  Additional History: Previous office visits were reviewed.  Labs: CMP was ordered and found to have a potassium of 2.7.  CBC was ordered and found to be normal urinalysis showed no abnormalities.  Digoxin  level was within normal limits.  Imaging: Chest x-ray was ordered and showed no abnormalities.  ED Course/Meds: While here her potassium was found to be 2.7 patient was then given a 10 mEq in the 100 mL bag.  After this she was given 40 mEq of potassium orally.  To continue to treat the hypokalemia patient was prescribed outpatient oral potassium.   It was noted at times that patient would have a junctional paced rhythm cardiology was consulted.  Attending talk to cardiology refer to note for further information on that.  Discussed with her daughter benefits of her coming into the hospital for observation and going home to [place].  Daughter states that she would prefer her to be discharged back to [place].      Final diagnoses:  Hypokalemia  Transient alteration of awareness    ED Discharge Orders          Ordered    potassium chloride  (KLOR-CON  M) 10 MEQ tablet  Daily        02/27/24 1150               Rosaline Almarie MATSU, [place] 02/27/24 1435    Cleotilde Rogue, MD 02/27/24 1525

## 2024-02-27 NOTE — ED Notes (Signed)
 Pt appears to have multiple bruises to upper arms, severely chapped lips and cut on the bridge of her nose.

## 2024-02-27 NOTE — ED Triage Notes (Addendum)
 Patient BIB [facility] for complaint of alter mental status. Due to UTI. Patient is on an antibiotic has only taken three so far. Facility reported patient informed them there was something in in her urine this morning, when facility staff did not note anything in urine. Some confusion noted during triage, bruising to upper left arm.

## 2024-02-27 NOTE — ED Provider Notes (Signed)
 Details of refer to I have evaluated the patient at the bedside, this is a patient that is familiar to me from her prior visit about a month ago where she was admitted with atrial fibrillation and cellulitis of the hand.  The patient presents today with some possible altered mental status although her daughter has arrived and states that she is completely at her baseline at this time and is not altered at all.  The patient has no chest pain, no shortness of breath and her workup has yielded very little other than some hypokalemia.  During the time that she was in the department she did have some transient changes on her EKG that are consistent with a wide bundle branch paced rhythm changes with some ST abnormality in her leads especially in the inferior and anterior leads.  I discussed these findings with Dr. Peter Jordan of the cardiology service who is seen in the EKG and agrees that the patient is not a candidate for aggressive management or catheterization.  He recommended that the patient could be admitted locally for further medical evaluation but that there would be no medication changes because she is already on Eliquis .  I discussed this with the patient and her family member who makes the decisions for her at the bedside and they requested the patient be discharged back to [facility] and not be admitted to the hospital.  I think this is a completely reasonable thing to do and although the patient appears chronically ill her vital signs have been unremarkable and she does not appear to be in distress and continues to deny chest pain.  They are aware of the indications for return.   Denise Rogue, MD 02/27/24 1430

## 2024-02-27 NOTE — ED Notes (Signed)
 DNR paper at bedside.

## 2024-02-27 NOTE — ED Notes (Signed)
 Pt O2 keeps dipping into the 80's, applied [place] at 2L and O2 status appears to be improving and staying now in the 90's

## 2024-02-27 NOTE — ED Notes (Signed)
 [facility] called for pt transport back to [place] [facility]

## 2024-02-27 NOTE — Discharge Instructions (Signed)
 Prescription for potassium was sent to pharmacy.  Begin taking that tomorrow for low potassium levels.  Follow-up with cardiologist for any changes such as chest pain.  If symptoms worsen return to the ER.

## 2024-02-27 NOTE — ED Notes (Signed)
 Report called to Nursing home.

## 2024-02-27 NOTE — ED Notes (Signed)
 ED Provider at bedside.

## 2024-02-29 ENCOUNTER — Telehealth: Payer: Self-pay | Admitting: Cardiovascular Disease

## 2024-02-29 NOTE — Telephone Encounter (Signed)
 Yes, I think unless there are new problems we will continue with remote monitoring and virtual visits only

## 2024-02-29 NOTE — Telephone Encounter (Signed)
 Caller Kandra) stated patient does not transfer well and it takes 3 people to transfer the patient.  Caller wants to know if they can send in patient's pacer information instead of patient having an in office visit.  Caller stated can ask for [place] or Jodie.

## 2024-03-02 ENCOUNTER — Ambulatory Visit: Attending: Cardiovascular Disease | Admitting: Cardiovascular Disease

## 2024-03-02 ENCOUNTER — Ambulatory Visit

## 2024-03-02 DIAGNOSIS — I480 Paroxysmal atrial fibrillation: Secondary | ICD-10-CM

## 2024-03-02 NOTE — Telephone Encounter (Signed)
 Vina called back in for update. Requesting a call back and something be faxed over about virtual visits. Please advise.   Fax: [phone]

## 2024-03-02 NOTE — Telephone Encounter (Signed)
 Denise Pugh, Device CMA has reached out to the patient's facility to have her transmit.

## 2024-03-03 LAB — CUP PACEART REMOTE DEVICE CHECK
Battery Remaining Longevity: 160 mo
Battery Voltage: 3.04 V
Brady Statistic RA Percent Paced: 0 %
Brady Statistic RV Percent Paced: 35.31 %
Date Time Interrogation Session: 20251211085358
Implantable Lead Connection Status: 753985
Implantable Lead Connection Status: 753985
Implantable Lead Implant Date: 20150430
Implantable Lead Implant Date: 20150430
Implantable Lead Location: 753859
Implantable Lead Location: 753860
Implantable Lead Model: 5076
Implantable Lead Model: 5076
Implantable Pulse Generator Implant Date: 20240725
Lead Channel Impedance Value: 342 Ohm
Lead Channel Impedance Value: 418 Ohm
Lead Channel Impedance Value: 437 Ohm
Lead Channel Impedance Value: 513 Ohm
Lead Channel Pacing Threshold Amplitude: 0.875 V
Lead Channel Pacing Threshold Pulse Width: 0.4 ms
Lead Channel Sensing Intrinsic Amplitude: 0.5 mV
Lead Channel Sensing Intrinsic Amplitude: 0.5 mV
Lead Channel Sensing Intrinsic Amplitude: 10.375 mV
Lead Channel Sensing Intrinsic Amplitude: 10.375 mV
Lead Channel Setting Pacing Amplitude: 2 V
Lead Channel Setting Pacing Pulse Width: 0.4 ms
Lead Channel Setting Sensing Sensitivity: 4 mV
Zone Setting Status: 755011
Zone Setting Status: 755011

## 2024-03-09 NOTE — Progress Notes (Signed)
 Remote PPM Transmission

## 2024-03-18 ENCOUNTER — Ambulatory Visit: Payer: Self-pay | Admitting: Cardiovascular Disease

## 2024-03-22 ENCOUNTER — Observation Stay (HOSPITAL_COMMUNITY)
Admission: EM | Admit: 2024-03-22 | Discharge: 2024-03-31 | Disposition: A | Discharge status: Skilled Nursing Facility | Attending: Family Medicine | Admitting: Family Medicine

## 2024-03-22 ENCOUNTER — Encounter (HOSPITAL_COMMUNITY): Payer: Self-pay

## 2024-03-22 ENCOUNTER — Other Ambulatory Visit: Payer: Self-pay

## 2024-03-22 ENCOUNTER — Emergency Department (HOSPITAL_COMMUNITY)

## 2024-03-22 DIAGNOSIS — R2681 Unsteadiness on feet: Secondary | ICD-10-CM | POA: Diagnosis not present

## 2024-03-22 DIAGNOSIS — M6281 Muscle weakness (generalized): Secondary | ICD-10-CM | POA: Insufficient documentation

## 2024-03-22 DIAGNOSIS — I4819 Other persistent atrial fibrillation: Secondary | ICD-10-CM | POA: Diagnosis present

## 2024-03-22 DIAGNOSIS — R7989 Other specified abnormal findings of blood chemistry: Secondary | ICD-10-CM | POA: Diagnosis not present

## 2024-03-22 DIAGNOSIS — Z950 Presence of cardiac pacemaker: Secondary | ICD-10-CM | POA: Diagnosis present

## 2024-03-22 DIAGNOSIS — I10 Essential (primary) hypertension: Secondary | ICD-10-CM | POA: Diagnosis present

## 2024-03-22 DIAGNOSIS — K219 Gastro-esophageal reflux disease without esophagitis: Secondary | ICD-10-CM | POA: Diagnosis present

## 2024-03-22 DIAGNOSIS — I495 Sick sinus syndrome: Secondary | ICD-10-CM | POA: Diagnosis present

## 2024-03-22 DIAGNOSIS — Z6830 Body mass index (BMI) 30.0-30.9, adult: Secondary | ICD-10-CM | POA: Insufficient documentation

## 2024-03-22 DIAGNOSIS — E66811 Obesity, class 1: Secondary | ICD-10-CM | POA: Diagnosis not present

## 2024-03-22 DIAGNOSIS — E039 Hypothyroidism, unspecified: Secondary | ICD-10-CM | POA: Diagnosis not present

## 2024-03-22 DIAGNOSIS — R2689 Other abnormalities of gait and mobility: Secondary | ICD-10-CM | POA: Diagnosis not present

## 2024-03-22 DIAGNOSIS — J90 Pleural effusion, not elsewhere classified: Secondary | ICD-10-CM | POA: Insufficient documentation

## 2024-03-22 DIAGNOSIS — Z79899 Other long term (current) drug therapy: Secondary | ICD-10-CM | POA: Insufficient documentation

## 2024-03-22 DIAGNOSIS — Z7901 Long term (current) use of anticoagulants: Secondary | ICD-10-CM | POA: Insufficient documentation

## 2024-03-22 DIAGNOSIS — R55 Syncope and collapse: Secondary | ICD-10-CM | POA: Diagnosis not present

## 2024-03-22 DIAGNOSIS — E876 Hypokalemia: Secondary | ICD-10-CM | POA: Diagnosis not present

## 2024-03-22 DIAGNOSIS — F418 Other specified anxiety disorders: Secondary | ICD-10-CM | POA: Insufficient documentation

## 2024-03-22 DIAGNOSIS — G8929 Other chronic pain: Secondary | ICD-10-CM | POA: Insufficient documentation

## 2024-03-22 HISTORY — DX: Presence of cardiac pacemaker: Z95.0

## 2024-03-22 LAB — COMPREHENSIVE METABOLIC PANEL WITH GFR
ALT: 13 U/L (ref 0–44)
AST: 26 U/L (ref 15–41)
Albumin: 3 g/dL — ABNORMAL LOW (ref 3.5–5.0)
Alkaline Phosphatase: 124 U/L (ref 38–126)
Anion gap: 16 — ABNORMAL HIGH (ref 5–15)
BUN: 10 mg/dL (ref 8–23)
CO2: 24 mmol/L (ref 22–32)
Calcium: 8.5 mg/dL — ABNORMAL LOW (ref 8.9–10.3)
Chloride: 94 mmol/L — ABNORMAL LOW (ref 98–111)
Creatinine, Ser: 0.61 mg/dL (ref 0.44–1.00)
GFR, Estimated: >60 mL/min
Glucose, Bld: 136 mg/dL — ABNORMAL HIGH (ref 70–99)
Potassium: 2.3 mmol/L — CL (ref 3.5–5.1)
Sodium: 133 mmol/L — ABNORMAL LOW (ref 135–145)
Total Bilirubin: 1.5 mg/dL — ABNORMAL HIGH (ref 0.0–1.2)
Total Protein: 6.8 g/dL (ref 6.5–8.1)

## 2024-03-22 LAB — CBC WITH DIFFERENTIAL/PLATELET
Abs Immature Granulocytes: 0.05 K/uL (ref 0.00–0.07)
Basophils Absolute: 0 K/uL (ref 0.0–0.1)
Basophils Relative: 0 %
Eosinophils Absolute: 0 K/uL (ref 0.0–0.5)
Eosinophils Relative: 0 %
HCT: 42.1 % (ref 36.0–46.0)
Hemoglobin: 14.5 g/dL (ref 12.0–15.0)
Immature Granulocytes: 1 %
Lymphocytes Relative: 25 %
Lymphs Abs: 2.4 K/uL (ref 0.7–4.0)
MCH: 30.6 pg (ref 26.0–34.0)
MCHC: 34.4 g/dL (ref 30.0–36.0)
MCV: 88.8 fL (ref 80.0–100.0)
Monocytes Absolute: 1.3 K/uL — ABNORMAL HIGH (ref 0.1–1.0)
Monocytes Relative: 13 %
Neutro Abs: 6 K/uL (ref 1.7–7.7)
Neutrophils Relative %: 61 %
Platelets: 202 K/uL (ref 150–400)
RBC: 4.74 MIL/uL (ref 3.87–5.11)
RDW: 15.3 % (ref 11.5–15.5)
WBC: 9.9 K/uL (ref 4.0–10.5)
nRBC: 0 % (ref 0.0–0.2)

## 2024-03-22 LAB — TROPONIN T, HIGH SENSITIVITY
Troponin T High Sensitivity: 60 ng/L — ABNORMAL HIGH (ref 0–19)
Troponin T High Sensitivity: 58 ng/L — ABNORMAL HIGH (ref 0–19)

## 2024-03-22 LAB — MAGNESIUM: Magnesium: 1.8 mg/dL (ref 1.7–2.4)

## 2024-03-22 MED ORDER — POTASSIUM CHLORIDE 10 MEQ/100ML IV SOLN
10.0000 meq | INTRAVENOUS | Status: AC
  Administered 2024-03-22 (×2): 10 meq via INTRAVENOUS
  Filled 2024-03-22 (×2): qty 100

## 2024-03-22 MED ORDER — APIXABAN 5 MG PO TABS
5.0000 mg | ORAL_TABLET | Freq: Two times a day (BID) | ORAL | Status: DC
  Administered 2024-03-22 – 2024-03-31 (×18): 5 mg via ORAL
  Filled 2024-03-22 (×18): qty 1

## 2024-03-22 MED ORDER — LACTATED RINGERS IV BOLUS
1000.0000 mL | Freq: Once | INTRAVENOUS | Status: AC
  Administered 2024-03-22: 1000 mL via INTRAVENOUS

## 2024-03-22 MED ORDER — SODIUM CHLORIDE 0.9 % IV SOLN: Freq: Once | INTRAVENOUS | Status: AC

## 2024-03-22 MED ORDER — POTASSIUM CHLORIDE CRYS ER 20 MEQ PO TBCR
40.0000 meq | EXTENDED_RELEASE_TABLET | Freq: Once | ORAL | Status: AC
  Administered 2024-03-22: 40 meq via ORAL
  Filled 2024-03-22: qty 2

## 2024-03-22 MED ORDER — POTASSIUM CHLORIDE 10 MEQ/100ML IV SOLN
10.0000 meq | INTRAVENOUS | Status: AC
  Administered 2024-03-22 – 2024-03-23 (×2): 10 meq via INTRAVENOUS
  Filled 2024-03-22 (×2): qty 100

## 2024-03-22 NOTE — H&P (Signed)
 " History and Physical  Denise Pugh FMW:[id number] DOB: November 30, 1928 DOA: 03/22/2024  Referring physician: Cecily Eva  PCP: Shona Norleen PEDLAR, MD  Outpatient Specialists: Cardiology. Patient coming from: SNF.  Chief Complaint: Syncope  HPI: Denise Pugh is a 89 y.o. female with medical history significant for persistent atrial fibrillation on Eliquis  and digoxin , hypothyroidism, chronic anxiety/depression, GERD, sick sinus syndrome status post pacemaker placement, chronic anxiety, who presented to the ER after a witnessed syncopal episode by family at SNF.  Family reports, the patient was unresponsive for about a minute.  Reportedly, she initially felt nauseous prior to losing consciousness.  EMS was activated.  Upon EMS arrival, the patient's blood pressure was elevated and she was bradycardic.  No reported chest pain or palpitations.  In the ER, BP 161/63, with rate of 73.  The patient has a pacemaker in place, pending interrogation.  Lab studies were notable for hypokalemia with potassium of 2.3.  High-sensitivity troponin 60, 58.  Chest x-ray showed small bilateral pleural effusions.  The patient received IV fluid bolus LR x 1 L, p.o. KCl 40 mg x 1.  TRH, hospitalist service, was asked to admit for syncope.  ED Course: Temperature 98.  BP 126/100, pulse 62, respiratory rate 22, O2 saturation 91% on room air.  Review of Systems: Review of systems as noted in the HPI. All other systems reviewed and are negative.   Past Medical History:  Diagnosis Date   Arrhythmia    HX of SVT. CARDIONET MONITOR 07/15/12 TO 08/13/12   Arthritis    Family history of adverse reaction to anesthesia 1970s   mother   GERD (gastroesophageal reflux disease)    Hyperlipidemia 08/06/11   lexiscan myoview-normal. Due to severe attenuation the study specificity and sensitivty are deminished.   Hypertension 08/25/10   ECHO-EF 60-65%   Hypothyroidism    Irregular heart beat    Shortness of breath    Sleep  apnea    SLEEP STUDY-[facility]   Thyroid  disease    Past Surgical History:  Procedure Laterality Date   CARDIAC CATHETERIZATION  07/06/01   spade-like configuration   CARDIOVERSION N/A 06/07/2017   Procedure: CARDIOVERSION;  Surgeon: Francyne Headland, MD;  Location: [facility];  Service: Cardiovascular;  Laterality: N/A;   CARDIOVERSION N/A 04/14/2018   Procedure: CARDIOVERSION;  Surgeon: Francyne Headland, MD;  Location: [facility];  Service: Cardiovascular;  Laterality: N/A;   DILATION AND CURETTAGE OF UTERUS     EYE SURGERY     KNEE ARTHROSCOPY     PERMANENT PACEMAKER INSERTION N/A 07/20/2013   Procedure: PERMANENT PACEMAKER INSERTION;  Surgeon: Headland Francyne, MD;  Location: [facility] CATH LAB;  Service: Cardiovascular;  Laterality: N/A;   PPM GENERATOR CHANGEOUT N/A 10/15/2022   Procedure: PPM GENERATOR CHANGEOUT;  Surgeon: Francyne Headland, MD;  Location: [facility] INVASIVE CV LAB;  Service: Cardiovascular;  Laterality: N/A;    Social History:  reports that she has never smoked. She has never used smokeless tobacco. She reports that she does not drink alcohol and does not use drugs.   Allergies[1]  Family History  Problem Relation Age of Onset   Diabetes Mother    Hypertension Father    Diabetes Father    Heart attack Maternal Grandmother    Heart attack Maternal Grandfather       Prior to Admission medications  Medication Sig Start Date End Date Taking? Authorizing Provider  acetaminophen  (TYLENOL ) 500 MG tablet Take 500 mg by mouth every 6 (six)  hours as needed for moderate pain, fever or headache.   Yes [provider]  apixaban  (ELIQUIS ) 5 MG TABS tablet Take 1 tablet (5 mg total) by mouth 2 (two) times daily. NEEDS CARDIOLOGY APPT FOR REFILLS, CALL OFFICE [phone] 12/27/23  Yes Croitoru, Mihai, MD  bacitracin (QC BACITRACIN) 500 UNIT/GM ointment Apply 1 Application topically 2 (two) times daily. Apply to face, nose, chin, scalp topically two times a  day for scratching. 02/25/24  Yes [provider]  buPROPion  (WELLBUTRIN ) 75 MG tablet Take 150 mg by mouth every morning. 10/07/20  Yes [provider]  Cholecalciferol  (VITAMIN D ) 2000 UNITS tablet Take 2,000 Units by mouth every morning.   Yes [provider]  diclofenac  Sodium (VOLTAREN ) 1 % GEL Apply 2 g topically 2 (two) times daily. Apply to right knee and shoulder topically every morning and at bedtime for pain. 01/18/24  Yes [provider]  digoxin  (LANOXIN ) 0.125 MG tablet Take 1 tablet (125 mcg total) by mouth daily. 01/13/24  Yes Shahmehdi, Adriana LABOR, MD  diltiazem  (CARDIZEM  CD) 120 MG 24 hr capsule Take 1 capsule (120 mg total) by mouth daily. 01/14/24 03/22/25 Yes Shahmehdi, Adriana LABOR, MD  escitalopram  (LEXAPRO ) 20 MG tablet Take 1 tablet (20 mg total) by mouth daily. 01/13/24  Yes Shahmehdi, Seyed A, MD  esomeprazole (NEXIUM) 40 MG capsule Take 40 mg by mouth daily.   Yes [provider]  levothyroxine  (SYNTHROID ) 100 MCG tablet Take 100 mcg by mouth daily before breakfast.   Yes [provider]  melatonin 3 MG TABS tablet Take 2 tablets (6 mg total) by mouth at bedtime as needed. Patient taking differently: Take 6 mg by mouth at bedtime. 01/13/24  Yes Shahmehdi, Adriana LABOR, MD  Multiple Vitamins-Minerals (OPTIC-VITES PO) Take 2 tablets by mouth every morning.   Yes [provider]  nystatin  (MYCOSTATIN /NYSTOP ) powder Apply 1 Application topically 2 (two) times daily as needed (comfort). 09/21/19  Yes [provider]  oxybutynin  (DITROPAN ) 5 MG tablet Take 5 mg by mouth 2 (two) times daily.  01/21/15  Yes [provider]  potassium chloride  (KLOR-CON  M) 10 MEQ tablet Take 1 tablet (10 mEq total) by mouth daily. 02/27/24  Yes Rosaline Almarie MATSU, PA-C  senna (SENOKOT) 8.6 MG tablet Take 1 tablet by mouth 2 (two) times daily. 01/16/24  Yes [provider]  traMADol (ULTRAM) 50 MG tablet Take 50 mg by mouth 2  (two) times daily. 0700 and 1900   Yes [provider]  traMADol (ULTRAM) 50 MG tablet Take 50 mg by mouth every 6 (six) hours as needed for moderate pain (pain score 4-6) or severe pain (pain score 7-10).   Yes [provider]  atorvastatin  (LIPITOR) 20 MG tablet TAKE 1/2 TABLET BY MOUTH ONCE DAILY. Patient not taking: Reported on 03/22/2024 03/02/23   Burnard Debby LABOR, MD  metoprolol  tartrate (LOPRESSOR ) 100 MG tablet Take 1.5 tablets (150 mg total) by mouth 2 (two) times daily. Patient not taking: Reported on 03/22/2024 07/26/23   Croitoru, Jerel, MD    Physical Exam: BP (!) 157/56   Pulse 60   Temp 98 F (36.7 C) (Oral)   Resp 20   Ht 5' 3 (1.6 m)   Wt 77.1 kg   SpO2 95%   BMI 30.11 kg/m   General: 89 y.o. year-old female well developed well nourished in no acute distress.  Alert and verbal. Cardiovascular: Irregular rate and rhythm with no rubs or gallops.  No thyromegaly or  JVD noted.  Trace lower extremity edema bilaterally. Respiratory: Clear to auscultation with no wheezes or rales. Good inspiratory effort. Abdomen: Soft nontender nondistended with normal bowel sounds x4 quadrants. Muskuloskeletal: No cyanosis or clubbing noted bilaterally Neuro: CN II-XII intact, strength, sensation, reflexes Skin: No ulcerative lesions noted or rashes Psychiatry: Judgement and Mood are appropriate for condition and setting          Labs on Admission:  Basic Metabolic Panel: Recent Labs  Lab 03/22/24 1656  NA 133*  K 2.3*  CL 94*  CO2 24  GLUCOSE 136*  BUN 10  CREATININE 0.61  CALCIUM  8.5*  MG 1.8   Liver Function Tests: Recent Labs  Lab 03/22/24 1656  AST 26  ALT 13  ALKPHOS 124  BILITOT 1.5*  PROT 6.8  ALBUMIN 3.0*   No results for input(s): LIPASE, AMYLASE in the last 168 hours. No results for input(s): AMMONIA in the last 168 hours. CBC: Recent Labs  Lab 03/22/24 1656  WBC 9.9  NEUTROABS 6.0  HGB 14.5  HCT 42.1  MCV 88.8  PLT 202    Cardiac Enzymes: No results for input(s): CKTOTAL, CKMB, CKMBINDEX, TROPONINI in the last 168 hours.  BNP (last 3 results) No results for input(s): BNP in the last 8760 hours.  ProBNP (last 3 results) No results for input(s): PROBNP in the last 8760 hours.  CBG: No results for input(s): GLUCAP in the last 168 hours.  Radiological Exams on Admission: DG Chest Portable 1 View Result Date: 03/22/2024 EXAM: 1 VIEW(S) XRAY OF THE CHEST 03/22/2024 04:49:00 PM COMPARISON: None available. CLINICAL HISTORY: Syncope FINDINGS: LINES, TUBES AND DEVICES: Left pectoral face of the device. LUNGS AND PLEURA: Small bilateral pleural effusions with associated basilar atelectasis. Pneumonia is not excluded. Stable clinically. No pneumothorax. HEART AND MEDIASTINUM: Left pectoral pacemaker device. No acute abnormality of the cardiac and mediastinal silhouettes. BONES AND SOFT TISSUES: No acute osseous abnormality. IMPRESSION: 1. Small bilateral pleural effusions with associated basilar atelectasis. Pneumonia not excluded. Electronically signed by: Vanetta Chou MD 03/22/2024 05:47 PM EST RP Workstation: [id number]    EKG: I independently viewed the EKG done and my findings are as followed: AV disassociation rate of 64.  QTc 493.  Assessment/Plan Present on Admission:  Syncope  Principal Problem:   Syncope  Syncope unclear etiology The patient has a pacemaker in place, pending interrogation. Monitor on telemetry Follow orthostatic vital signs. Follow transthoracic echocardiogram Replete electrolytes Fall precautions  Persistent atrial fibrillation Currently rate controlled Resume home Eliquis  Resume home Cardizem  Hold digoxin  and follow digoxin  level  Hypokalemia Serum potassium 2.3 Repleted intravenously and orally Check magnesium  level Repeat BMP in the morning.  Elevated troponin, suspect demand ischemia in the setting of syncope High-sensitivity troponin peaked at  60 and downtrended. No reported anginal symptoms. Continue to monitor on telemetry.  Hypothyroidism Resume home levothyroxine   Chronic anxiety/depression Resume home regimen  Obesity BMI 30 Recommend weight loss outpatient regular physical activity and healthy dieting.   Time: 75 minutes.   DVT prophylaxis: Home Eliquis  twice daily.  Code Status: DNR/DNI.  Family Communication: None at bedside.  Disposition Plan: Admitted to telemetry unit.  Consults called: None.  Admission status: Observation status.   Status is: Observation    Terry LOISE Hurst MD [facility] Pager [phone]  If 7PM-7AM, please contact night-coverage [email or web address] Password [email or web address]  03/22/2024, 10:28 PM      [1]  Allergies Allergen Reactions   Penicillins Other (See Comments)    Has patient had a  PCN reaction causing immediate rash, facial/tongue/throat swelling, SOB or lightheadedness with hypotension: Unknown Has patient had a PCN reaction causing severe rash involving mucus membranes or skin necrosis: Unknown Has patient had a PCN reaction that required hospitalization: Unknown Has patient had a PCN reaction occurring within the last 10 years: Unknown If all of the above answers are NO, then may proceed with Cephalosporin use.    Tetanus Toxoid Swelling   "

## 2024-03-22 NOTE — ED Triage Notes (Signed)
 Pt arrived via [facility] from [facility] c/o syncopal episode that was witnessed by Pt's family. [facility] report Pts BP has been elevated with bradycardia.

## 2024-03-22 NOTE — ED Provider Notes (Signed)
 " [place] EMERGENCY DEPARTMENT AT [facility] Provider Note   CSN: [id number] Arrival date & time: 03/22/24  1610     Patient presents with: Loss of Consciousness   Denise Pugh is a 89 y.o. female.  Patient with past history significant for hyperlipidemia, GERD, and failure to thrive presents to the emergency department with report of loss of consciousness.  Patient was at her facility, [facility], when she had a witnessed syncopal episode by family.  They called her for physical episode of A-fib that she became somewhat unresponsive briefly.  This resolved quickly after several seconds.  Second ambulance and states that she initially felt nauseous but has not had vomiting and nausea resolved.   Loss of Consciousness      Prior to Admission medications  Medication Sig Start Date End Date Taking? Authorizing Provider  acetaminophen  (TYLENOL ) 500 MG tablet Take 500 mg by mouth every 6 (six) hours as needed for moderate pain, fever or headache.   Yes [provider]  apixaban  (ELIQUIS ) 5 MG TABS tablet Take 1 tablet (5 mg total) by mouth 2 (two) times daily. NEEDS CARDIOLOGY APPT FOR REFILLS, CALL OFFICE [phone] 12/27/23  Yes Croitoru, Mihai, MD  bacitracin (QC BACITRACIN) 500 UNIT/GM ointment Apply 1 Application topically 2 (two) times daily. Apply to face, nose, chin, scalp topically two times a day for scratching. 02/25/24  Yes [provider]  buPROPion  (WELLBUTRIN ) 75 MG tablet Take 150 mg by mouth every morning. 10/07/20  Yes [provider]  Cholecalciferol  (VITAMIN D ) 2000 UNITS tablet Take 2,000 Units by mouth every morning.   Yes [provider]  diclofenac  Sodium (VOLTAREN ) 1 % GEL Apply 2 g topically 2 (two) times daily. Apply to right knee and shoulder topically every morning and at bedtime for pain. 01/18/24  Yes [provider]  digoxin  (LANOXIN ) 0.125 MG tablet Take 1 tablet (125 mcg total) by  mouth daily. 01/13/24  Yes Shahmehdi, Adriana LABOR, MD  diltiazem  (CARDIZEM  CD) 120 MG 24 hr capsule Take 1 capsule (120 mg total) by mouth daily. 01/14/24 03/22/25 Yes Shahmehdi, Adriana LABOR, MD  escitalopram  (LEXAPRO ) 20 MG tablet Take 1 tablet (20 mg total) by mouth daily. 01/13/24  Yes Shahmehdi, Seyed A, MD  esomeprazole (NEXIUM) 40 MG capsule Take 40 mg by mouth daily.   Yes [provider]  levothyroxine  (SYNTHROID ) 100 MCG tablet Take 100 mcg by mouth daily before breakfast.   Yes [provider]  melatonin 3 MG TABS tablet Take 2 tablets (6 mg total) by mouth at bedtime as needed. Patient taking differently: Take 6 mg by mouth at bedtime. 01/13/24  Yes Shahmehdi, Adriana LABOR, MD  Multiple Vitamins-Minerals (OPTIC-VITES PO) Take 2 tablets by mouth every morning.   Yes [provider]  nystatin  (MYCOSTATIN /NYSTOP ) powder Apply 1 Application topically 2 (two) times daily as needed (comfort). 09/21/19  Yes [provider]  oxybutynin  (DITROPAN ) 5 MG tablet Take 5 mg by mouth 2 (two) times daily.  01/21/15  Yes [provider]  potassium chloride  (KLOR-CON  M) 10 MEQ tablet Take 1 tablet (10 mEq total) by mouth daily. 02/27/24  Yes Rosaline Almarie MATSU, PA-C  senna (SENOKOT) 8.6 MG tablet Take 1 tablet by mouth 2 (two) times daily. 01/16/24  Yes [provider]  traMADol (ULTRAM) 50 MG tablet Take 50 mg by mouth 2 (two) times daily. 0700 and 1900   Yes [provider]  traMADol (ULTRAM) 50 MG tablet Take 50  mg by mouth every 6 (six) hours as needed for moderate pain (pain score 4-6) or severe pain (pain score 7-10).   Yes [provider]  atorvastatin  (LIPITOR) 20 MG tablet TAKE 1/2 TABLET BY MOUTH ONCE DAILY. Patient not taking: Reported on 03/22/2024 03/02/23   Burnard Debby LABOR, MD  metoprolol  tartrate (LOPRESSOR ) 100 MG tablet Take 1.5 tablets (150 mg total) by mouth 2 (two) times daily. Patient not taking: Reported on 03/22/2024 07/26/23    Croitoru, Jerel, MD    Allergies: Penicillins and Tetanus toxoid    Review of Systems  Cardiovascular:  Positive for syncope.  Neurological:  Positive for syncope.  All other systems reviewed and are negative.   Updated Vital Signs BP (!) 126/100   Pulse 62   Temp 98 F (36.7 C) (Oral)   Resp (!) 22   Ht 5' 3 (1.6 m)   Wt 77.1 kg   SpO2 91%   BMI 30.11 kg/m   Physical Exam Vitals and nursing note reviewed.  Constitutional:      General: She is not in acute distress.    Appearance: She is well-developed.  HENT:     Head: Normocephalic and atraumatic.  Eyes:     Conjunctiva/sclera: Conjunctivae normal.  Cardiovascular:     Rate and Rhythm: Normal rate and regular rhythm.     Heart sounds: No murmur heard. Pulmonary:     Effort: Pulmonary effort is normal. No respiratory distress.     Breath sounds: Normal breath sounds.  Abdominal:     Palpations: Abdomen is soft.     Tenderness: There is no abdominal tenderness.  Musculoskeletal:        General: No swelling.     Cervical back: Neck supple.  Skin:    General: Skin is warm and dry.     Capillary Refill: Capillary refill takes less than 2 seconds.  Neurological:     Mental Status: She is alert.  Psychiatric:        Mood and Affect: Mood normal.     (all labs ordered are listed, but only abnormal results are displayed) Labs Reviewed  CBC WITH DIFFERENTIAL/PLATELET - Abnormal; Notable for the following components:      Result Value   Monocytes Absolute 1.3 (*)    All other components within normal limits  COMPREHENSIVE METABOLIC PANEL WITH GFR - Abnormal; Notable for the following components:   Sodium 133 (*)    Potassium 2.3 (*)    Chloride 94 (*)    Glucose, Bld 136 (*)    Calcium  8.5 (*)    Albumin 3.0 (*)    Total Bilirubin 1.5 (*)    Anion gap 16 (*)    All other components within normal limits  BASIC METABOLIC PANEL WITH GFR - Abnormal; Notable for the following components:   Sodium 133 (*)     Potassium 3.1 (*)    Chloride 94 (*)    Glucose, Bld 101 (*)    Calcium  8.2 (*)    All other components within normal limits  TROPONIN T, HIGH SENSITIVITY - Abnormal; Notable for the following components:   Troponin T High Sensitivity 60 (*)    All other components within normal limits  TROPONIN T, HIGH SENSITIVITY - Abnormal; Notable for the following components:   Troponin T High Sensitivity 58 (*)    All other components within normal limits  MAGNESIUM   URINALYSIS, ROUTINE W REFLEX MICROSCOPIC    EKG: None  Radiology: DG Chest Portable 1  View Result Date: 03/22/2024 EXAM: 1 VIEW(S) XRAY OF THE CHEST 03/22/2024 04:49:00 PM COMPARISON: None available. CLINICAL HISTORY: Syncope FINDINGS: LINES, TUBES AND DEVICES: Left pectoral face of the device. LUNGS AND PLEURA: Small bilateral pleural effusions with associated basilar atelectasis. Pneumonia is not excluded. Stable clinically. No pneumothorax. HEART AND MEDIASTINUM: Left pectoral pacemaker device. No acute abnormality of the cardiac and mediastinal silhouettes. BONES AND SOFT TISSUES: No acute osseous abnormality. IMPRESSION: 1. Small bilateral pleural effusions with associated basilar atelectasis. Pneumonia not excluded. Electronically signed by: Vanetta Chou MD 03/22/2024 05:47 PM EST RP Workstation: [id number]     Procedures   Medications Ordered in the ED  apixaban  (ELIQUIS ) tablet 5 mg (5 mg Oral Given 03/22/24 2247)  potassium chloride  10 mEq in 100 mL IVPB (10 mEq Intravenous New Bag/Given 03/22/24 2324)  potassium chloride  SA (KLOR-CON  M) CR tablet 40 mEq (40 mEq Oral Given 03/22/24 1825)  potassium chloride  10 mEq in 100 mL IVPB (0 mEq Intravenous Stopped 03/22/24 2142)  lactated ringers bolus 1,000 mL (0 mLs Intravenous Stopped 03/22/24 2147)  0.9 %  sodium chloride  infusion ( Intravenous New Bag/Given 03/22/24 2323)                                    Medical Decision Making Amount and/or Complexity of Data  Reviewed Labs: ordered. Radiology: ordered.  Risk Prescription drug management. Decision regarding hospitalization.   This patient presents to the ED for concern of syncope, this involves an extensive number of treatment options, and is a complaint that carries with it a high risk of complications and morbidity.  The differential diagnosis includes vasovagal syncope, stress reaction, ACS, dehydration, UTI   Co morbidities that complicate the patient evaluation  GERD, failure to thrive, hyperlipidemia   Additional history obtained:  Additional history obtained from chart review   Lab Tests:  I Ordered, and personally interpreted labs.  The pertinent results include: CBC unremarkable, CMP with severe hypokalemia and slight anion gap likely hydration related, troponin elevated at 60 with repeat at 58, UA pending   Imaging Studies ordered:  I ordered imaging studies including chest x-ray I independently visualized and interpreted imaging which showed Small bilateral pleural effusions with associated basilar atelectasis. Pneumonia not excluded. I agree with the radiologist interpretation   Cardiac Monitoring: / EKG:  The patient was maintained on a cardiac monitor.  I personally viewed and interpreted the cardiac monitored which showed an underlying rhythm of: magne   Consultations Obtained:  I requested consultation with the hospitalist,  and discussed lab and imaging findings as well as pertinent plan - they recommend: Spoke with Dr. Shona, hospitalist, who will be admitting patient.   Problem List / ED Course / Critical interventions / Medication management  Patient with past history significant for HLD, GERD, and failure to thrive presents to the ED with concerns of syncope. Reportedly had witnessed syncopal event with family while at her facility. She does not recall and specific symptoms leading up to her collapse but states there were a lot of people in my room. She  denies any chest pain or shortness of breath. No recent fever, cough, chills, congestion, sore throat, or body aches. Exam is largely reassuring without any new abnormal cardiac or pulmonary findings. Abdomen is soft and nontender. Workup reveals considerably hypokalemia at 2.3. No obvious GI loss, unclear if this is dehydration vs malnutrition vs other. Slight anion gap present.  Potassium repleted orally and through IV. Troponin elevated but down trending. UA pending. Chest xray with small bilateral pleural effusions present. Otherwise unremarkable. This appears to be a high risk syncopal event and I believe patient will benefit from overnight observation and continued potassium repletion. Hospitalist consulted. Spoke with Dr. Shona, hospitalist, who will be admitting patient. I ordered medication including fluids, potassium for hydration, hypokalemia  Reevaluation of the patient after these medicines showed that the patient improved I have reviewed the patients home medicines and have made adjustments as needed   Social Determinants of Health:  SNF resident   Test / Admission - Considered:  Admission required.   Final diagnoses:  Syncope, unspecified syncope type  Hypokalemia  Elevated troponin    ED Discharge Orders     None          Denise Legrand LABOR, PA-C 03/23/24 0023  "

## 2024-03-23 ENCOUNTER — Encounter (HOSPITAL_COMMUNITY): Payer: Self-pay | Admitting: Internal Medicine

## 2024-03-23 ENCOUNTER — Observation Stay (HOSPITAL_COMMUNITY)

## 2024-03-23 ENCOUNTER — Other Ambulatory Visit: Payer: Self-pay

## 2024-03-23 ENCOUNTER — Other Ambulatory Visit (HOSPITAL_COMMUNITY): Payer: Self-pay | Admitting: *Deleted

## 2024-03-23 DIAGNOSIS — R55 Syncope and collapse: Secondary | ICD-10-CM | POA: Diagnosis not present

## 2024-03-23 LAB — CBC
HCT: 38.9 % (ref 36.0–46.0)
Hemoglobin: 13.3 g/dL (ref 12.0–15.0)
MCH: 30.3 pg (ref 26.0–34.0)
MCHC: 34.2 g/dL (ref 30.0–36.0)
MCV: 88.6 fL (ref 80.0–100.0)
Platelets: 192 K/uL (ref 150–400)
RBC: 4.39 MIL/uL (ref 3.87–5.11)
RDW: 15.4 % (ref 11.5–15.5)
WBC: 8.6 K/uL (ref 4.0–10.5)
nRBC: 0 % (ref 0.0–0.2)

## 2024-03-23 LAB — BASIC METABOLIC PANEL WITH GFR
Anion gap: 9 (ref 5–15)
Anion gap: 8 (ref 5–15)
BUN: 9 mg/dL (ref 8–23)
BUN: 9 mg/dL (ref 8–23)
CO2: 29 mmol/L (ref 22–32)
CO2: 30 mmol/L (ref 22–32)
Calcium: 8.2 mg/dL — ABNORMAL LOW (ref 8.9–10.3)
Calcium: 8.1 mg/dL — ABNORMAL LOW (ref 8.9–10.3)
Chloride: 94 mmol/L — ABNORMAL LOW (ref 98–111)
Chloride: 96 mmol/L — ABNORMAL LOW (ref 98–111)
Creatinine, Ser: 0.49 mg/dL (ref 0.44–1.00)
Creatinine, Ser: 0.49 mg/dL (ref 0.44–1.00)
GFR, Estimated: >60 mL/min
GFR, Estimated: >60 mL/min
Glucose, Bld: 101 mg/dL — ABNORMAL HIGH (ref 70–99)
Glucose, Bld: 86 mg/dL (ref 70–99)
Potassium: 3.1 mmol/L — ABNORMAL LOW (ref 3.5–5.1)
Potassium: 3.2 mmol/L — ABNORMAL LOW (ref 3.5–5.1)
Sodium: 133 mmol/L — ABNORMAL LOW (ref 135–145)
Sodium: 133 mmol/L — ABNORMAL LOW (ref 135–145)

## 2024-03-23 LAB — PHOSPHORUS: Phosphorus: 2.4 mg/dL — ABNORMAL LOW (ref 2.5–4.6)

## 2024-03-23 LAB — DIGOXIN LEVEL: Digoxin Level: 1.6 ng/mL (ref 0.8–2.0)

## 2024-03-23 MED ORDER — POLYETHYLENE GLYCOL 3350 17 G PO PACK: 17.0000 g | PACK | Freq: Every day | ORAL | Status: DC | PRN

## 2024-03-23 MED ORDER — POTASSIUM PHOSPHATES 15 MMOLE/5ML IV SOLN
15.0000 mmol | Freq: Once | INTRAVENOUS | Status: AC
  Administered 2024-03-23: 15 mmol via INTRAVENOUS
  Filled 2024-03-23: qty 5

## 2024-03-23 MED ORDER — BACITRACIN 500 UNIT/GM EX OINT
1.0000 | TOPICAL_OINTMENT | Freq: Two times a day (BID) | CUTANEOUS | Status: DC
  Administered 2024-03-23 – 2024-03-31 (×17): 1 via TOPICAL
  Filled 2024-03-23 (×20): qty 0.9

## 2024-03-23 MED ORDER — POTASSIUM CHLORIDE 20 MEQ PO PACK
20.0000 meq | PACK | Freq: Three times a day (TID) | ORAL | Status: AC
  Administered 2024-03-23 (×3): 20 meq via ORAL
  Filled 2024-03-23 (×3): qty 1

## 2024-03-23 MED ORDER — DILTIAZEM HCL ER COATED BEADS 120 MG PO CP24
120.0000 mg | ORAL_CAPSULE | Freq: Every day | ORAL | Status: DC
  Administered 2024-03-23 – 2024-03-31 (×9): 120 mg via ORAL
  Filled 2024-03-23 (×9): qty 1

## 2024-03-23 MED ORDER — POTASSIUM CHLORIDE CRYS ER 20 MEQ PO TBCR: 40.0000 meq | EXTENDED_RELEASE_TABLET | Freq: Once | ORAL | Status: DC

## 2024-03-23 MED ORDER — PROCHLORPERAZINE EDISYLATE 10 MG/2ML IJ SOLN: 5.0000 mg | Freq: Four times a day (QID) | INTRAMUSCULAR | Status: DC | PRN

## 2024-03-23 MED ORDER — VITAMIN D 25 MCG (1000 UNIT) PO TABS
2000.0000 [IU] | ORAL_TABLET | Freq: Every morning | ORAL | Status: DC
  Administered 2024-03-23 – 2024-03-31 (×9): 2000 [IU] via ORAL
  Filled 2024-03-23 (×9): qty 2

## 2024-03-23 MED ORDER — BUPROPION HCL 75 MG PO TABS
150.0000 mg | ORAL_TABLET | Freq: Every morning | ORAL | Status: DC
  Administered 2024-03-23 – 2024-03-31 (×9): 150 mg via ORAL
  Filled 2024-03-23 (×11): qty 2

## 2024-03-23 MED ORDER — TRAMADOL HCL 50 MG PO TABS: 50.0000 mg | ORAL_TABLET | Freq: Four times a day (QID) | ORAL | Status: DC | PRN

## 2024-03-23 MED ORDER — DIGOXIN 125 MCG PO TABS: 125.0000 ug | ORAL_TABLET | Freq: Every day | ORAL | Status: DC

## 2024-03-23 MED ORDER — MELATONIN 5 MG PO TABS: 5.0000 mg | ORAL_TABLET | Freq: Every evening | ORAL | Status: DC | PRN

## 2024-03-23 MED ORDER — ESCITALOPRAM OXALATE 10 MG PO TABS
20.0000 mg | ORAL_TABLET | Freq: Every day | ORAL | Status: DC
  Administered 2024-03-23 – 2024-03-31 (×9): 20 mg via ORAL
  Filled 2024-03-23 (×9): qty 2

## 2024-03-23 MED ORDER — LEVOTHYROXINE SODIUM 100 MCG PO TABS
100.0000 ug | ORAL_TABLET | Freq: Every day | ORAL | Status: DC
  Administered 2024-03-23 – 2024-03-31 (×9): 100 ug via ORAL
  Filled 2024-03-23 (×9): qty 1

## 2024-03-23 MED ORDER — PANTOPRAZOLE SODIUM 40 MG PO TBEC
40.0000 mg | DELAYED_RELEASE_TABLET | Freq: Every day | ORAL | Status: DC
  Administered 2024-03-23 – 2024-03-31 (×9): 40 mg via ORAL
  Filled 2024-03-23 (×9): qty 1

## 2024-03-23 MED ORDER — ACETAMINOPHEN 500 MG PO TABS: 500.0000 mg | ORAL_TABLET | Freq: Four times a day (QID) | ORAL | Status: DC | PRN

## 2024-03-23 NOTE — [place] FL2 (Signed)
 " [place]  MEDICAID FL2 LEVEL OF CARE FORM     IDENTIFICATION  Patient Name: Denise Pugh Birthdate: 06-01-1928 Sex: female Admission Date (Current Location): 03/22/2024  [facility] and [place] Number:  [employer] and Address:  [facility],  [address], [place] [address]      Provider Number: [id number]  Attending Physician Name and Address:  Bryn Bernardino NOVAK, MD  Relative Name and Phone Number:  EZRA GUNNER  Daughter,  [phone]    Current Level of Care: Hospital Recommended Level of Care: Skilled Nursing Facility Prior Approval Number:    Date Approved/Denied:   PASRR Number: [id number]  Discharge Plan: SNF    Current Diagnoses: Patient Active Problem List   Diagnosis Date Noted   Syncope 03/22/2024   Cellulitis of right hand 01/10/2024   Hypokalemia 01/10/2024   Hyponatremia 01/10/2024   Leukocytosis 01/10/2024   Dehydration 01/10/2024   Palliative care by specialist 01/10/2024   DNR (do not resuscitate) 01/10/2024   Failure to thrive in adult 01/10/2024   Paroxysmal atrial fibrillation with RVR (HCC) 01/09/2024   Osteoarthritis of left knee 03/05/2021   Osteoarthritis of right knee 03/05/2021   Apical variant hypertrophic cardiomyopathy (HCC) 04/26/2018   Pacemaker 04/26/2018   Other persistent atrial fibrillation (HCC)    Persistent atrial fibrillation (HCC)    Neck pain 05/15/2017   First degree AV block 01/13/2016   Long term current use of anticoagulant 01/13/2016   Anticoagulation adequate 02/12/2015   Mixed hyperlipidemia 02/28/2014   Tachycardia-bradycardia syndrome (HCC) 07/21/2013   Bradycardia, drug induced 07/20/2013   Symptomatic sinus bradycardia 07/20/2013   S/P cardiac pacemaker procedure: Dr. Francyne: Generator [employer] Advisa MRI  07/20/2013   Atrial fibrillation with RVR (HCC) 07/18/2013   Bradycardia, also idioventricular rhythm  07/18/2013   PAF (paroxysmal atrial fibrillation) (HCC) 06/26/2013    Grieving 05/29/2013   CAP (community acquired pneumonia) 04/08/2011   Hypothyroidism (acquired) 04/08/2011   Enthesopathy 06/15/2008   RHINITIS, CHRONIC 04/30/2008   HIP PAIN, RIGHT 04/30/2008   KNEE PAIN, BILATERAL 04/30/2008   INTERTRIGO, CANDIDAL 01/05/2008   DIARRHEA 01/05/2008   COSTOCHONDRITIS 06/29/2007   HYPERLIPIDEMIA 09/30/2006   FATIGUE 09/30/2006   PARESTHESIA 09/30/2006   GASTRIC POLYP 17-Mar-2006   Hypothyroidism 03/17/06   DEPRESSION, recent death of her husband 17-Mar-2006   Essential hypertension 03/17/06   SUPRAVENTRICULAR TACHYCARDIA March 17, 2006   GERD (gastroesophageal reflux disease) Mar 17, 2006   MELANOSIS COLI 2006/03/17   Osteoarthritis 2006/03/17   LOW BACK PAIN 17-Mar-2006   DIVERTICULITIS, HX OF 2006/03/17    Orientation RESPIRATION BLADDER Height & Weight     Self  Normal Incontinent Weight: 77.1 kg Height:  5' 3 (160 cm)  BEHAVIORAL SYMPTOMS/MOOD NEUROLOGICAL BOWEL NUTRITION STATUS      Continent Diet (See DC summary)  AMBULATORY STATUS COMMUNICATION OF NEEDS Skin   Extensive Assist Verbally Bruising                       Personal Care Assistance Level of Assistance  Bathing, Feeding, Dressing Bathing Assistance: Maximum assistance Feeding assistance: Limited assistance Dressing Assistance: Maximum assistance     Functional Limitations Info  Sight, Hearing, Speech Sight Info: Impaired Hearing Info: Impaired Speech Info: Adequate    SPECIAL CARE FACTORS FREQUENCY                       Contractures Contractures Info: Not present    Additional Factors Info  Code Status, Allergies Code Status  Info: DNR- Limited Allergies Info: Penicillins, tetanus           Current Medications (03/23/2024):  This is the current hospital active medication list Current Facility-Administered Medications  Medication Dose Route Frequency Provider Last Rate Last Admin   acetaminophen  (TYLENOL ) tablet 500 mg  500 mg Oral Q6H PRN Hall,  Carole N, DO       apixaban  (ELIQUIS ) tablet 5 mg  5 mg Oral BID Hall, Carole N, DO   5 mg at 03/23/24 1058   bacitracin ointment 1 Application  1 Application Topical BID Shona Laurence N, DO   1 Application at 03/23/24 1058   buPROPion  (WELLBUTRIN ) tablet 150 mg  150 mg Oral q morning Shona Laurence SAILOR, DO   150 mg at 03/23/24 1058   cholecalciferol  (VITAMIN D3) 25 MCG (1000 UNIT) tablet 2,000 Units  2,000 Units Oral q morning Shona Laurence SAILOR, DO   2,000 Units at 03/23/24 1057   diltiazem  (CARDIZEM  CD) 24 hr capsule 120 mg  120 mg Oral Daily Shona Laurence N, DO   120 mg at 03/23/24 1057   escitalopram  (LEXAPRO ) tablet 20 mg  20 mg Oral Daily Shona Laurence N, DO   20 mg at 03/23/24 1058   levothyroxine  (SYNTHROID ) tablet 100 mcg  100 mcg Oral QAC breakfast Shona Laurence SAILOR, DO   100 mcg at 03/23/24 9488   melatonin tablet 5 mg  5 mg Oral QHS PRN Shona Laurence SAILOR, DO       pantoprazole  (PROTONIX ) EC tablet 40 mg  40 mg Oral Daily Shona Laurence N, DO   40 mg at 03/23/24 1058   polyethylene glycol (MIRALAX  / GLYCOLAX ) packet 17 g  17 g Oral Daily PRN Shona Laurence SAILOR, DO       potassium chloride  (KLOR-CON ) packet 20 mEq  20 mEq Oral TID Shona Laurence N, DO   20 mEq at 03/23/24 1058   potassium PHOSPHATE 15 mmol in dextrose  5 % 250 mL infusion  15 mmol Intravenous Once Shona Laurence SAILOR, DO 43 mL/hr at 03/23/24 0909 15 mmol at 03/23/24 0909   prochlorperazine (COMPAZINE) injection 5 mg  5 mg Intravenous Q6H PRN Shona Laurence SAILOR, DO         Discharge Medications: Please see discharge summary for a list of discharge medications.  Relevant Imaging Results:  Relevant Lab Results:   Additional Information SS# [id number]  Sharlyne Stabs, RN     "

## 2024-03-23 NOTE — Plan of Care (Signed)
 " Problem: Education: Goal: Knowledge of General Education information will improve Description: Including pain rating scale, medication(s)/side effects and non-pharmacologic comfort measures 03/23/2024 1926 by Olene Corean CROME, RN Outcome: Progressing 03/23/2024 1920 by Olene Corean CROME, RN Outcome: Progressing   Problem: Health Behavior/Discharge Planning: Goal: Ability to manage health-related needs will improve 03/23/2024 1926 by Olene Corean CROME, RN Outcome: Progressing 03/23/2024 1920 by Olene Corean CROME, RN Outcome: Progressing   Problem: Clinical Measurements: Goal: Ability to maintain clinical measurements within normal limits will improve 03/23/2024 1926 by Olene Corean CROME, RN Outcome: Progressing 03/23/2024 1920 by Olene Corean CROME, RN Outcome: Progressing Goal: Will remain free from infection 03/23/2024 1926 by Olene Corean CROME, RN Outcome: Progressing 03/23/2024 1920 by Olene Corean CROME, RN Outcome: Progressing Goal: Diagnostic test results will improve 03/23/2024 1926 by Olene Corean CROME, RN Outcome: Progressing 03/23/2024 1920 by Olene Corean CROME, RN Outcome: Progressing Goal: Respiratory complications will improve 03/23/2024 1926 by Olene Corean CROME, RN Outcome: Progressing 03/23/2024 1920 by Olene Corean CROME, RN Outcome: Progressing Goal: Cardiovascular complication will be avoided 03/23/2024 1926 by Olene Corean CROME, RN Outcome: Progressing 03/23/2024 1920 by Olene Corean CROME, RN Outcome: Progressing   Problem: Activity: Goal: Risk for activity intolerance will decrease 03/23/2024 1926 by Olene Corean CROME, RN Outcome: Progressing 03/23/2024 1920 by Olene Corean CROME, RN Outcome: Progressing   Problem: Nutrition: Goal: Adequate nutrition will be maintained 03/23/2024 1926 by Olene Corean CROME, RN Outcome: Progressing 03/23/2024 1920 by Olene Corean CROME, RN Outcome:  Progressing   Problem: Coping: Goal: Level of anxiety will decrease 03/23/2024 1926 by Olene Corean CROME, RN Outcome: Progressing 03/23/2024 1920 by Olene Corean CROME, RN Outcome: Progressing   Problem: Elimination: Goal: Will not experience complications related to bowel motility 03/23/2024 1926 by Olene Corean CROME, RN Outcome: Progressing 03/23/2024 1920 by Olene Corean CROME, RN Outcome: Progressing Goal: Will not experience complications related to urinary retention 03/23/2024 1926 by Olene Corean CROME, RN Outcome: Progressing 03/23/2024 1920 by Olene Corean CROME, RN Outcome: Progressing   Problem: Pain Managment: Goal: General experience of comfort will improve and/or be controlled 03/23/2024 1926 by Olene Corean CROME, RN Outcome: Progressing 03/23/2024 1920 by Olene Corean CROME, RN Outcome: Progressing   Problem: Safety: Goal: Ability to remain free from injury will improve 03/23/2024 1926 by Olene Corean CROME, RN Outcome: Progressing 03/23/2024 1920 by Olene Corean CROME, RN Outcome: Progressing   Problem: Skin Integrity: Goal: Risk for impaired skin integrity will decrease 03/23/2024 1926 by Olene Corean CROME, RN Outcome: Progressing 03/23/2024 1920 by Olene Corean CROME, RN Outcome: Progressing   Problem: Education: Goal: Knowledge of General Education information will improve Description: Including pain rating scale, medication(s)/side effects and non-pharmacologic comfort measures 03/23/2024 1926 by Olene Corean CROME, RN Outcome: Progressing 03/23/2024 1920 by Olene Corean CROME, RN Outcome: Progressing   Problem: Health Behavior/Discharge Planning: Goal: Ability to manage health-related needs will improve 03/23/2024 1926 by Olene Corean CROME, RN Outcome: Progressing 03/23/2024 1920 by Olene Corean CROME, RN Outcome: Progressing   Problem: Clinical Measurements: Goal: Ability to maintain clinical  measurements within normal limits will improve 03/23/2024 1926 by Olene Corean CROME, RN Outcome: Progressing 03/23/2024 1920 by Olene Corean CROME, RN Outcome: Progressing Goal: Will remain free from infection 03/23/2024 1926 by Olene Corean CROME, RN Outcome: Progressing 03/23/2024 1920 by Olene Corean CROME, RN Outcome: Progressing Goal: Diagnostic test results will improve 03/23/2024 1926 by Olene Corean CROME, RN Outcome: Progressing 03/23/2024 1920 by Olene Corean CROME, RN Outcome: Progressing Goal: Respiratory complications will  improve 03/23/2024 1926 by Olene Corean CROME, RN Outcome: Progressing 03/23/2024 1920 by Olene Corean CROME, RN Outcome: Progressing Goal: Cardiovascular complication will be avoided 03/23/2024 1926 by Olene Corean CROME, RN Outcome: Progressing 03/23/2024 1920 by Olene Corean CROME, RN Outcome: Progressing   Problem: Activity: Goal: Risk for activity intolerance will decrease 03/23/2024 1926 by Olene Corean CROME, RN Outcome: Progressing 03/23/2024 1920 by Olene Corean CROME, RN Outcome: Progressing   Problem: Nutrition: Goal: Adequate nutrition will be maintained 03/23/2024 1926 by Olene Corean CROME, RN Outcome: Progressing 03/23/2024 1920 by Olene Corean CROME, RN Outcome: Progressing   Problem: Coping: Goal: Level of anxiety will decrease 03/23/2024 1926 by Olene Corean CROME, RN Outcome: Progressing 03/23/2024 1920 by Olene Corean CROME, RN Outcome: Progressing   Problem: Elimination: Goal: Will not experience complications related to bowel motility 03/23/2024 1926 by Olene Corean CROME, RN Outcome: Progressing 03/23/2024 1920 by Olene Corean CROME, RN Outcome: Progressing Goal: Will not experience complications related to urinary retention 03/23/2024 1926 by Olene Corean CROME, RN Outcome: Progressing 03/23/2024 1920 by Olene Corean CROME, RN Outcome: Progressing   Problem: Pain  Managment: Goal: General experience of comfort will improve and/or be controlled 03/23/2024 1926 by Olene Corean CROME, RN Outcome: Progressing 03/23/2024 1920 by Olene Corean CROME, RN Outcome: Progressing   Problem: Safety: Goal: Ability to remain free from injury will improve 03/23/2024 1926 by Olene Corean CROME, RN Outcome: Progressing 03/23/2024 1920 by Olene Corean CROME, RN Outcome: Progressing   Problem: Skin Integrity: Goal: Risk for impaired skin integrity will decrease 03/23/2024 1926 by Olene Corean CROME, RN Outcome: Progressing 03/23/2024 1920 by Olene Corean CROME, RN Outcome: Progressing   Problem: Education: Goal: Knowledge of disease or condition will improve 03/23/2024 1926 by Olene Corean CROME, RN Outcome: Progressing 03/23/2024 1920 by Olene Corean CROME, RN Outcome: Progressing Goal: Knowledge of the prescribed therapeutic regimen will improve 03/23/2024 1926 by Olene Corean CROME, RN Outcome: Progressing 03/23/2024 1920 by Olene Corean CROME, RN Outcome: Progressing Goal: Individualized Educational Video(s) 03/23/2024 1926 by Olene Corean CROME, RN Outcome: Progressing 03/23/2024 1920 by Olene Corean CROME, RN Outcome: Progressing   Problem: Activity: Goal: Ability to tolerate increased activity will improve 03/23/2024 1926 by Olene Corean CROME, RN Outcome: Progressing 03/23/2024 1920 by Olene Corean CROME, RN Outcome: Progressing Goal: Will verbalize the importance of balancing activity with adequate rest periods 03/23/2024 1926 by Olene Corean CROME, RN Outcome: Progressing 03/23/2024 1920 by Olene Corean CROME, RN Outcome: Progressing   Problem: Respiratory: Goal: Ability to maintain a clear airway will improve 03/23/2024 1926 by Olene Corean CROME, RN Outcome: Progressing 03/23/2024 1920 by Olene Corean CROME, RN Outcome: Progressing Goal: Levels of oxygenation will improve 03/23/2024 1926 by  Olene Corean CROME, RN Outcome: Progressing 03/23/2024 1920 by Olene Corean CROME, RN Outcome: Progressing Goal: Ability to maintain adequate ventilation will improve 03/23/2024 1926 by Olene Corean CROME, RN Outcome: Progressing 03/23/2024 1920 by Olene Corean CROME, RN Outcome: Progressing   "

## 2024-03-23 NOTE — Plan of Care (Signed)
" °  Problem: Acute Rehab PT Goals(only PT should resolve) Goal: Pt Will Go Supine/Side To Sit Outcome: Progressing Flowsheets (Taken 03/23/2024 1226) Pt will go Supine/Side to Sit: with minimal assist Goal: Patient Will Transfer Sit To/From Stand Outcome: Progressing Flowsheets (Taken 03/23/2024 1226) Patient will transfer sit to/from stand: with moderate assist Goal: Pt Will Transfer Bed To Chair/Chair To Bed Outcome: Progressing Flowsheets (Taken 03/23/2024 1226) Pt will Transfer Bed to Chair/Chair to Bed: with mod assist Goal: Pt Will Ambulate Outcome: Progressing Flowsheets (Taken 03/23/2024 1226) Pt will Ambulate:  10 feet  with moderate assist  with rolling walker    12:27 PM, 03/23/2024 Rosaria Settler, PT, DPT [place] with [facility]  "

## 2024-03-23 NOTE — Evaluation (Signed)
 Physical Therapy Evaluation Patient Details Name: Denise Pugh MRN: [id number] DOB: 07/23/1928 Today's Date: 03/23/2024  History of Present Illness  Denise Pugh is a 89 y.o. female with medical history significant for persistent atrial fibrillation on Eliquis  and digoxin , hypothyroidism, chronic anxiety/depression, GERD, sick sinus syndrome status post pacemaker placement, chronic anxiety, who presented to the ER after a witnessed syncopal episode by family at SNF.  Family reports, the patient was unresponsive for about a minute.  Reportedly, she initially felt nauseous prior to losing consciousness.  EMS was activated.  Upon EMS arrival, the patient's blood pressure was elevated and she was bradycardic.  No reported chest pain or palpitations.   Clinical Impression  Patient agreeable to PT evaluation. Pt is only oriented to self during session with no family present. Patient is unable to provide history of current mobility status or assist level required for ADLs while she's been at [facility]. This date, patient requires moderate assist for bed mobility, and max assist for transfer with RW. Pt greatly limited due to reported increased knee pain bilaterally when she stands and is unable to clear feet from floor for steps this date. Pt returns to bed with mod assist. Max assist required for boosting in bed with nursing staff assist. Bed alarm set and call button in reach. Orthostatics taken in session were 155/70 in supine, 181/64 in sitting, and 143/70 in standing with no reports of symptoms throughout. Patient will benefit from continued skilled physical therapy acutely and in recommended venue in order to address current deficits and improve overall function.        If plan is discharge home, recommend the following: A lot of help with walking and/or transfers;A lot of help with bathing/dressing/bathroom;Assist for transportation;Assistance with cooking/housework;Supervision due to cognitive  status;Help with stairs or ramp for entrance   Can travel by private vehicle        Equipment Recommendations None recommended by PT  Recommendations for Other Services       Functional Status Assessment Patient has had a recent decline in their functional status and demonstrates the ability to make significant improvements in function in a reasonable and predictable amount of time.     Precautions / Restrictions Precautions Precautions: Fall Recall of Precautions/Restrictions: Impaired Restrictions Weight Bearing Restrictions Per Provider Order: No      Mobility  Bed Mobility Overal bed mobility: Needs Assistance Bed Mobility: Supine to Sit, Sit to Supine     Supine to sit: Mod assist Sit to supine: Mod assist, Max assist   General bed mobility comments: moderate assist needed for LE and trunk management with supine to sit and with scooting bottom to EOB, pt could initiate transfer with cueing but required assist to complete, mod A with sit to supine, max assist x2 for boosting up in bed, pt demo slow labored movement throughout    Transfers Overall transfer level: Needs assistance Equipment used: Rolling walker (2 wheels) Transfers: Sit to/from Stand Sit to Stand: Max assist           General transfer comment: max assist with STS from bed due to inc knee pain bilaterally with pt yelling out due to intensity, pt able to stnad for prolonged duration for BP reading but unable to clear feet from floor for transfer or ambulation    Ambulation/Gait               General Gait Details: Unable to safely perform this date. pt unable to clear feet from  floor due to inc knee pain bilaterally  Stairs            Wheelchair Mobility     Tilt Bed    Modified Rankin (Stroke Patients Only)       Balance Overall balance assessment: Needs assistance Sitting-balance support: No upper extremity supported, Feet supported Sitting balance-Leahy Scale:  Good Sitting balance - Comments: Seated EOB   Standing balance support: Bilateral upper extremity supported, During functional activity, Reliant on assistive device for balance Standing balance-Leahy Scale: Poor Standing balance comment: w/ RW                             Pertinent Vitals/Pain Pain Assessment Pain Assessment: Faces Faces Pain Scale: Hurts whole lot Pain Location: both knees with WB activities Pain Descriptors / Indicators: Discomfort, Moaning, Grimacing Pain Intervention(s): Limited activity within patient's tolerance, Monitored during session, Repositioned    Home Living Family/patient expects to be discharged to:: Private residence Living Arrangements: Alone Available Help at Discharge: Family Type of Home: House Home Access: Ramped entrance;Stairs to enter Entrance Stairs-Rails: None Entrance Stairs-Number of Steps: 3 stair in the back of home   Home Layout: One level Home Equipment: Rollator (4 wheels) Additional Comments: The above is taken from previous admission 2 months ago. Pt arrives from [facility]. pt oriented to self only this date and confused during session.    Prior Function Prior Level of Function : Needs assist;Patient poor historian/Family not available       Physical Assist : ADLs (physical)   ADLs (physical): Grooming;Bathing;Dressing;Toileting Mobility Comments: From last admission, pt reports ambulation with Rollator. This date, pt unable to confirm ambulatory status since she's been at ALF, unsure if she's been able to walk or with what assist. ADLs Comments: Per last admission, Pt. granddaughter assists with grooming, bathing, dressing, toileting, per chart. Today pt reported independence. pt reports at ALF I guess when asked if she receives help with ADLs     Extremity/Trunk Assessment   Upper Extremity Assessment Upper Extremity Assessment: Defer to OT evaluation    Lower Extremity Assessment Lower  Extremity Assessment: Generalized weakness (pt generally weak throughout. unable to formally assess due to increased pain in both knees with weight bearing, pt screams in pain during stand)    Cervical / Trunk Assessment Cervical / Trunk Assessment: Kyphotic  Communication   Communication Communication: No apparent difficulties;Other (comment) (pt HOH) Factors Affecting Communication: Other (comment)    Cognition Arousal: Alert Behavior During Therapy: WFL for tasks assessed/performed   PT - Cognitive impairments: No family/caregiver present to determine baseline                       PT - Cognition Comments: Pt oriented to self only. Disoriented to place, or situation Following commands: Intact       Cueing Cueing Techniques: Verbal cues, Tactile cues, Visual cues, Gestural cues     General Comments      Exercises     Assessment/Plan    PT Assessment Patient needs continued PT services;All further PT needs can be met in the next venue of care  PT Problem List Decreased strength;Decreased activity tolerance;Decreased balance;Decreased mobility;Pain       PT Treatment Interventions DME instruction;Balance training;Gait training;Functional mobility training;Therapeutic activities;Therapeutic exercise;Patient/family education;Stair training    PT Goals (Current goals can be found in the Care Plan section)  Acute Rehab PT Goals Patient Stated Goal: return home  after rehab stay PT Goal Formulation: With patient Time For Goal Achievement: 04/06/24 Potential to Achieve Goals: Good    Frequency Min 3X/week     Co-evaluation               AM-PAC PT 6 Clicks Mobility  Outcome Measure Help needed turning from your back to your side while in a flat bed without using bedrails?: A Little Help needed moving from lying on your back to sitting on the side of a flat bed without using bedrails?: A Lot Help needed moving to and from a bed to a chair (including a  wheelchair)?: A Lot Help needed standing up from a chair using your arms (e.g., wheelchair or bedside chair)?: A Lot Help needed to walk in hospital room?: A Lot Help needed climbing 3-5 steps with a railing? : A Lot 6 Click Score: 13    End of Session Equipment Utilized During Treatment: Gait belt Activity Tolerance: Patient limited by pain Patient left: in bed;with call bell/phone within reach;with bed alarm set Nurse Communication: Mobility status PT Visit Diagnosis: Difficulty in walking, not elsewhere classified (R26.2);Other abnormalities of gait and mobility (R26.89);Unsteadiness on feet (R26.81);Muscle weakness (generalized) (M62.81);History of falling (Z91.81);Pain Pain - Right/Left:  (both) Pain - part of body: Knee    Time: 8991-8964 PT Time Calculation (min) (ACUTE ONLY): 27 min   Charges:   PT Evaluation $PT Eval Moderate Complexity: 1 Mod   PT General Charges $$ ACUTE PT VISIT: 1 Visit         12:25 PM, 03/23/2024 Tanessa Tidd Powell-Butler, PT, DPT [place] with [facility]

## 2024-03-23 NOTE — Progress Notes (Signed)
*  PRELIMINARY RESULTS* Echocardiogram 2D Echocardiogram has been performed.  Teresa Aida PARAS 03/23/2024, 10:45 AM

## 2024-03-23 NOTE — TOC Progression Note (Addendum)
 Transition of Care [facility]) - Progression Note    Patient Details  Name: Denise Pugh MRN: [id number] Date of Birth: 1928-05-05  Transition of Care [facility]) CM/SW Contact  Sharlyne Stabs, RN Phone Number: 03/23/2024, 2:45 PM  Clinical Narrative:   Patient confused, staying on OBS status, CM called Cy daughter to discuss status, she is understanding. Signed and printed.  After discussion on SNF VS [facility]. Cy states her mother does not like it at [facility] and she feels she need more care. At this time she can not take her home. She want IPCM to send out for bed offers. Patient went to [facility] last admission. She prefers [facility] if possible. FL2 complete and sent out.  CM explained to daughter that her insurance is out of network for most facilities, offers will be limited. IPCM following.    Social Drivers of Health (SDOH) Interventions SDOH Screenings   Food Insecurity: No Food Insecurity (03/23/2024)  Housing: Low Risk (03/23/2024)  Transportation Needs: No Transportation Needs (03/23/2024)  Utilities: Not At Risk (03/23/2024)  Social Connections: Unknown (03/23/2024)  Recent Concern: Social Connections - Socially Isolated (01/10/2024)  Tobacco Use: Low Risk (03/23/2024)

## 2024-03-23 NOTE — TOC Progression Note (Addendum)
" °  Transition of Care [facility]) - Inpatient Brief Assessment   Patient Details  Name: Denise Pugh MRN: [id number] Date of Birth: [birth year]  Transition of Care [facility]) CM/SW Contact:    Sharlyne Stabs, RN Phone Number: 03/23/2024, 10:18 AM   Clinical Narrative: Patient admitted in OBS from [facility]F. Possibly ready for discharge later today after walking with PT.  CM called Vina at [facility]. They can not admit today. The plan was for patient to discharge home with daughter on Friday. However daughter may want her to come back a few days since she needed an hospital admission. Team updated patient usually refuses PT. IPCM following MD aware to discuss discharge with daughter, she may take her home.    Addendum; PT is recommending SNF, IPCM will call [facility] in the morning for an assessment.    Transition of Care Asessment: Insurance and Status: Insurance coverage has been reviewed Patient has primary care physician: Yes Home environment has been reviewed: [facility]F Prior level of function:: need assistance Prior/Current Home Services: No current home services Social Drivers of Health Review: SDOH reviewed no interventions necessary Readmission risk has been reviewed: Yes Transition of care needs: no transition of care needs at this time        Expected Discharge Plan and Services     [facility] VS Home   Social Drivers of Health (SDOH) Interventions SDOH Screenings   Food Insecurity: No Food Insecurity (03/23/2024)  Housing: Low Risk (03/23/2024)  Transportation Needs: No Transportation Needs (03/23/2024)  Utilities: Not At Risk (03/23/2024)  Social Connections: Unknown (03/23/2024)  Recent Concern: Social Connections - Socially Isolated (01/10/2024)  Tobacco Use: Low Risk (03/23/2024)   "

## 2024-03-23 NOTE — Progress Notes (Signed)
 [facility] PROGRESS NOTE  Denise Pugh (DOB: 07/28/1928) FMW:[id number] PCP: Denise Norleen PEDLAR, MD Outpatient Specialists: Cardiology, Dr. Francyne  Brief Narrative: Denise Pugh is a 89 year old female with persistent atrial fibrillation on apixaban  and digoxin , sick sinus syndrome s/p dual-chamber pacemaker (generator changed 09/2022), hypothyroidism, chronic anxiety/depression, GERD, admitted 03/22/2024 following witnessed syncopal episode at [facility]. Patient felt nauseous then became unresponsive for approximately one minute. EMS found elevated BP and bradycardia.   In ED, hypertensive (161/63) with normal HR (73). Labs notable for severe hypokalemia (K 2.3), mildly elevated high-sensitivity troponin (60 ? 58), and small bilateral pleural effusions on CXR. Received 1L LR bolus and potassium repletion. Pacemaker interrogation completed, functioning appropriately with appropriate lead parameters and pacing/sensing thresholds.   She was admitted for syncope evaluation with echocardiogram pending and PT/OT evaluations.   Subjective: Pt in chair, has no recollection of exact events surrounding admission. No recurrent fainting spells or lightheadedness.   Objective: BP 137/87 (BP Location: Right Arm)   Pulse 70   Temp 98.4 F (36.9 C) (Oral)   Resp 16   Ht 5' 3 (1.6 m)   Wt 77.1 kg   SpO2 98%   BMI 30.11 kg/m   Gen: No distress, elderly female Pulm: Clear, nonlabored  CV: Regular, rate 60-70's, no MRG, no pitting edema GI: Soft, NT, ND, +BS  Neuro: Alert and mostly oriented. No new focal deficits. Ext: Warm, no deformities. Skin: No rashes, lesions or ulcers on visualized skin   Pacemaker interpretation:  Dual-chamber pacemaker functioning appropriately with normal battery status, excellent lead parameters, and appropriate pacing/sensing thresholds. The device is pacing the ventricle 35% of the time, indicating moderate pacing dependency. No atrial pacing (0%) is consistent with  persistent atrial fibrillation. The pacemaker does not appear to be implicated in the syncopal event - all parameters are within normal limits with adequate safety margins. The patient's underlying rhythm shows 65% intrinsic ventricular activity, suggesting the pacemaker is providing backup support for bradycardia episodes related to sick sinus syndrome.  Assessment & Plan: Syncope - etiology unclear, likely multifactorial - Witnessed episode with prodromal nausea followed by ~1 minute loss of consciousness. Differential includes, severe hypokalemia-induced dysrhythmia (despite pacemaker's normal functioning), orthostatic hypotension, vasovagal.  - Patient has moderate pacing dependency (35% RV paced) but adequate intrinsic rhythm 65% of time. Continue telemetry monitoring.  - Check orthostatic vital signs.  - Transthoracic echocardiogram to assess cardiac function and rule out structural abnormalities.  - Fall precautions.  - Consider tilt table testing if recurrent episodes and workup unrevealing.  Severe hypokalemia: Improving from 2.3 on admission, now 3.2 after IV and PO repletion. Likely contributing factor to syncope via arrhythmia risk.  - Continue aggressive potassium repletion with goal K >4.0. Repeat BMP.. Mg 1.8.  - Patient on home KCl 10 mEq daily - may need dose increase. Monitor for rebound hyperkalemia given renal function.  Persistent atrial fibrillation: Rate controlled.  - Continue apixaban  5 mg BID for stroke prophylaxis (CHA?DS?-VASc score high given age 54, female).  - Continue diltiazem  120 mg daily for rate control.  - Digoxin  level 1.6 ng/mL (upper therapeutic range) - hold digoxin  given level and concern for contribution to bradycardia/syncope. Recheck digoxin  level in 2-3 days. May restart at lower dose (0.125 mg every other day) if needed for rate control, though diltiazem  alone may be sufficient. Monitor on telemetry. Needs f/u with Dr. JAYSON.   Mildly elevated troponin:  High-sensitivity troponin 60?58, likely demand ischemia in setting of syncope/hypotension rather than  ACS. No anginal symptoms. Trending down appropriately. Continue telemetry monitoring.  - No indication for antiplatelet therapy or heparin  given on apixaban .  - Pt no longer taking statin. Not inappropriate in this 89yo.   Small bilateral pleural effusions: Possibly related to hypoalbuminemia (albumin 3.0). No respiratory distress currently, SpO2 95% on RA.  - Echo as above.   Hyponatremia: Mild- Na 133 (stable, chronic).    Hypophosphatemia:  - Supplement  Hypothyroidism  - Continue levothyroxine  100 mcg daily. Will check TSH for thoroughness.   Chronic anxiety/depression  - Continue escitalopram  20 mg daily and bupropion  150 mg daily   GERD  - Continue PPI  Chronic pain:  - Continue tramadol 50 mg q6h prn. Monitor for sedation/fall risk.  Obesity: BMI 30.   DNR/DNI: POA, noted.   Denise KATHEE Come, MD [facility] [email or web address] 03/23/2024, 5:18 PM

## 2024-03-23 NOTE — Plan of Care (Signed)
  Problem: Education: Goal: Knowledge of General Education information will improve Description: Including pain rating scale, medication(s)/side effects and non-pharmacologic comfort measures Outcome: Progressing   Problem: Health Behavior/Discharge Planning: Goal: Ability to manage health-related needs will improve Outcome: Progressing   Problem: Clinical Measurements: Goal: Ability to maintain clinical measurements within normal limits will improve Outcome: Progressing Goal: Will remain free from infection Outcome: Progressing Goal: Diagnostic test results will improve Outcome: Progressing Goal: Respiratory complications will improve Outcome: Progressing Goal: Cardiovascular complication will be avoided Outcome: Progressing   Problem: Activity: Goal: Risk for activity intolerance will decrease Outcome: Progressing   Problem: Nutrition: Goal: Adequate nutrition will be maintained Outcome: Progressing   Problem: Coping: Goal: Level of anxiety will decrease Outcome: Progressing   Problem: Elimination: Goal: Will not experience complications related to bowel motility Outcome: Progressing Goal: Will not experience complications related to urinary retention Outcome: Progressing   Problem: Pain Managment: Goal: General experience of comfort will improve and/or be controlled Outcome: Progressing   Problem: Safety: Goal: Ability to remain free from injury will improve Outcome: Progressing   Problem: Skin Integrity: Goal: Risk for impaired skin integrity will decrease Outcome: Progressing   Problem: Education: Goal: Knowledge of General Education information will improve Description: Including pain rating scale, medication(s)/side effects and non-pharmacologic comfort measures Outcome: Progressing   Problem: Health Behavior/Discharge Planning: Goal: Ability to manage health-related needs will improve Outcome: Progressing   Problem: Clinical Measurements: Goal:  Ability to maintain clinical measurements within normal limits will improve Outcome: Progressing Goal: Will remain free from infection Outcome: Progressing Goal: Diagnostic test results will improve Outcome: Progressing Goal: Respiratory complications will improve Outcome: Progressing Goal: Cardiovascular complication will be avoided Outcome: Progressing   Problem: Activity: Goal: Risk for activity intolerance will decrease Outcome: Progressing   Problem: Nutrition: Goal: Adequate nutrition will be maintained Outcome: Progressing   Problem: Coping: Goal: Level of anxiety will decrease Outcome: Progressing   Problem: Elimination: Goal: Will not experience complications related to bowel motility Outcome: Progressing Goal: Will not experience complications related to urinary retention Outcome: Progressing   Problem: Pain Managment: Goal: General experience of comfort will improve and/or be controlled Outcome: Progressing   Problem: Safety: Goal: Ability to remain free from injury will improve Outcome: Progressing   Problem: Skin Integrity: Goal: Risk for impaired skin integrity will decrease Outcome: Progressing   Problem: Education: Goal: Knowledge of disease or condition will improve Outcome: Progressing Goal: Knowledge of the prescribed therapeutic regimen will improve Outcome: Progressing Goal: Individualized Educational Video(s) Outcome: Progressing   Problem: Activity: Goal: Ability to tolerate increased activity will improve Outcome: Progressing Goal: Will verbalize the importance of balancing activity with adequate rest periods Outcome: Progressing   Problem: Respiratory: Goal: Ability to maintain a clear airway will improve Outcome: Progressing Goal: Levels of oxygenation will improve Outcome: Progressing Goal: Ability to maintain adequate ventilation will improve Outcome: Progressing

## 2024-03-23 NOTE — Care Management Obs Status (Signed)
 MEDICARE OBSERVATION STATUS NOTIFICATION   Patient Details  Name: Denise Pugh MRN: [id number] Date of Birth: 1929-03-04   Medicare Observation Status Notification Given:  Yes    Sharlyne Stabs, RN 03/23/2024, 2:45 PM

## 2024-03-24 DIAGNOSIS — R55 Syncope and collapse: Secondary | ICD-10-CM | POA: Diagnosis not present

## 2024-03-24 LAB — BASIC METABOLIC PANEL WITH GFR
Anion gap: 8 (ref 5–15)
BUN: 7 mg/dL — ABNORMAL LOW (ref 8–23)
CO2: 29 mmol/L (ref 22–32)
Calcium: 8.2 mg/dL — ABNORMAL LOW (ref 8.9–10.3)
Chloride: 96 mmol/L — ABNORMAL LOW (ref 98–111)
Creatinine, Ser: 0.49 mg/dL (ref 0.44–1.00)
GFR, Estimated: >60 mL/min
Glucose, Bld: 68 mg/dL — ABNORMAL LOW (ref 70–99)
Potassium: 3.3 mmol/L — ABNORMAL LOW (ref 3.5–5.1)
Sodium: 133 mmol/L — ABNORMAL LOW (ref 135–145)

## 2024-03-24 LAB — ECHOCARDIOGRAM COMPLETE
AR max vel: 1.2 cm2
AV Area VTI: 1.13 cm2
AV Area mean vel: 1.19 cm2
AV Mean grad: 9 mmHg
AV Peak grad: 18.9 mmHg
Ao pk vel: 2.18 m/s
Area-P 1/2: 4.6 cm2
Height: 63 in
MV M vel: 5.34 m/s
MV Peak grad: 114.1 mmHg
P 1/2 time: 518 ms
S' Lateral: 2.9 cm
Weight: 2719.59 [oz_av]

## 2024-03-24 LAB — TSH: TSH: 2.98 u[IU]/mL (ref 0.350–4.500)

## 2024-03-24 LAB — MAGNESIUM: Magnesium: 1.9 mg/dL (ref 1.7–2.4)

## 2024-03-24 MED ORDER — POTASSIUM CHLORIDE CRYS ER 20 MEQ PO TBCR
40.0000 meq | EXTENDED_RELEASE_TABLET | Freq: Two times a day (BID) | ORAL | Status: AC
  Administered 2024-03-24 (×2): 40 meq via ORAL
  Filled 2024-03-24 (×2): qty 2

## 2024-03-24 NOTE — Evaluation (Signed)
 Occupational Therapy Evaluation Patient Details Name: Denise Pugh MRN: [id number] DOB: April 13, 1928 Today's Date: 03/24/2024   History of Present Illness   Denise Pugh is a 89 y.o. female with medical history significant for persistent atrial fibrillation on Eliquis  and digoxin , hypothyroidism, chronic anxiety/depression, GERD, sick sinus syndrome status post pacemaker placement, chronic anxiety, who presented to the ER after a witnessed syncopal episode by family at SNF.  Family reports, the patient was unresponsive for about a minute.  Reportedly, she initially felt nauseous prior to losing consciousness.  EMS was activated.  Upon EMS arrival, the patient's blood pressure was elevated and she was bradycardic.  No reported chest pain or palpitations. (per DO)     Clinical Impressions Pt agreeable to OT evaluation. Pt not oriented by able to follow commands. Pt required mod A for bed mobility and max to total assist for EOB to chair transfer with RW. B UE are generally weak at this time. Max to total assist for lower body ADL's based on observation and clinical judgement today. Pt left in the chair with call bell within reach. Pt will benefit from continued OT in the hospital to increase strength, balance, and endurance for safe ADL's.        If plan is discharge home, recommend the following:   A lot of help with walking and/or transfers;A lot of help with bathing/dressing/bathroom;Assistance with cooking/housework;Assist for transportation;Help with stairs or ramp for entrance;Direct supervision/assist for medications management     Functional Status Assessment   Patient has had a recent decline in their functional status and demonstrates the ability to make significant improvements in function in a reasonable and predictable amount of time.     Equipment Recommendations   None recommended by OT             Precautions/Restrictions   Precautions Precautions:  Fall Recall of Precautions/Restrictions: Impaired Restrictions Weight Bearing Restrictions Per Provider Order: No     Mobility Bed Mobility Overal bed mobility: Needs Assistance Bed Mobility: Supine to Sit     Supine to sit: Mod assist     General bed mobility comments: Trunk control assist; labored movement with extended time.    Transfers Overall transfer level: Needs assistance Equipment used: Rolling walker (2 wheels) Transfers: Sit to/from Stand, Bed to chair/wheelchair/BSC Sit to Stand: Max assist Stand pivot transfers: Max assist, Total assist         General transfer comment: x2 reps of STS with RW; max to total for pivot to chair.      Balance Overall balance assessment: Needs assistance Sitting-balance support: No upper extremity supported, Feet supported Sitting balance-Leahy Scale: Fair Sitting balance - Comments: fair to good seated at EOB   Standing balance support: Bilateral upper extremity supported, During functional activity, Reliant on assistive device for balance Standing balance-Leahy Scale: Poor Standing balance comment: w/ RW                           ADL either performed or assessed with clinical judgement   ADL Overall ADL's : Needs assistance/impaired Eating/Feeding: Set up;Sitting;Modified independent   Grooming: Set up;Sitting;Contact guard assist   Upper Body Bathing: Contact guard assist;Sitting   Lower Body Bathing: Maximal assistance;Total assistance;Sitting/lateral leans;Bed level   Upper Body Dressing : Contact guard assist;Sitting   Lower Body Dressing: Maximal assistance;Total assistance;Sitting/lateral leans;Bed level   Toilet Transfer: Maximal assistance;Total assistance;Rolling walker (2 wheels);Stand-pivot Statistician Details (indicate cue type and reason):  EOB to chair with RW Toileting- Clothing Manipulation and Hygiene: Maximal assistance;Total assistance;Bed level               Vision  Baseline Vision/History: 1 Wears glasses Ability to See in Adequate Light: 0 Adequate Patient Visual Report: No change from baseline Vision Assessment?: No apparent visual deficits     Perception Perception: Not tested       Praxis Praxis: Not tested       Pertinent Vitals/Pain Pain Assessment Pain Assessment: Faces Faces Pain Scale: Hurts whole lot Pain Location: both knees with transfer Pain Descriptors / Indicators: Discomfort, Moaning, Grimacing Pain Intervention(s): Limited activity within patient's tolerance, Monitored during session, Repositioned     Extremity/Trunk Assessment Upper Extremity Assessment Upper Extremity Assessment: Generalized weakness   Lower Extremity Assessment Lower Extremity Assessment: Defer to PT evaluation   Cervical / Trunk Assessment Cervical / Trunk Assessment: Kyphotic   Communication Communication Communication: Impaired Factors Affecting Communication: Hearing impaired   Cognition Arousal: Alert Behavior During Therapy: WFL for tasks assessed/performed Cognition: No family/caregiver present to determine baseline                               Following commands: Intact       Cueing  General Comments   Cueing Techniques: Verbal cues;Tactile cues;Visual cues;Gestural cues                 Home Living Family/patient expects to be discharged to:: Assisted living                                 Additional Comments: Chart indicates pt is from ALF. Pt is a poor historian.      Prior Functioning/Environment Prior Level of Function : Needs assist;Patient poor historian/Family not available       Physical Assist : ADLs (physical)   ADLs (physical): Grooming;Bathing;Dressing;Toileting Mobility Comments: Ambulation with rollator per chart ADLs Comments: ADL assist (per chart)    OT Problem List: Decreased strength;Decreased activity tolerance;Impaired balance (sitting and/or standing);Decreased  cognition;Decreased knowledge of use of DME or AE;Pain   OT Treatment/Interventions: Self-care/ADL training;Therapeutic exercise;DME and/or AE instruction;Therapeutic activities;Cognitive remediation/compensation;Patient/family education;Balance training      OT Goals(Current goals can be found in the care plan section)   Acute Rehab OT Goals Patient Stated Goal: Improve strength. OT Goal Formulation: With patient Time For Goal Achievement: 04/07/24 Potential to Achieve Goals: Good   OT Frequency:  Min 2X/week    AM-PAC OT 6 Clicks Daily Activity     Outcome Measure Help from another person eating meals?: A Little Help from another person taking care of personal grooming?: A Little Help from another person toileting, which includes using toliet, bedpan, or urinal?: A Lot Help from another person bathing (including washing, rinsing, drying)?: A Lot Help from another person to put on and taking off regular upper body clothing?: A Little Help from another person to put on and taking off regular lower body clothing?: A Lot 6 Click Score: 15   End of Session Equipment Utilized During Treatment: Rolling walker (2 wheels);Gait belt Nurse Communication: Mobility status  Activity Tolerance: Patient tolerated treatment well Patient left: in chair;with call bell/phone within reach;with chair alarm set  OT Visit Diagnosis: Unsteadiness on feet (R26.81);Other abnormalities of gait and mobility (R26.89);Muscle weakness (generalized) (M62.81);Other symptoms and signs involving cognitive function  Time: 9074-9057 OT Time Calculation (min): 17 min Charges:  OT General Charges $OT Visit: 1 Visit OT Evaluation $OT Eval Low Complexity: 1 Low  Lynita Groseclose OT, MOT  Jayson Person 03/24/2024, 11:09 AM

## 2024-03-24 NOTE — Progress Notes (Signed)
 [facility] PROGRESS NOTE  CRYSTALMARIE YASIN (DOB: 1928-05-16) FMW:[id number] PCP: Shona Norleen PEDLAR, MD Outpatient Specialists: Cardiology, Dr. Francyne  Brief Narrative: KELSEY DURFLINGER is a 89 year old female with persistent atrial fibrillation on apixaban  and digoxin , sick sinus syndrome s/p dual-chamber pacemaker (generator changed 09/2022), hypothyroidism, chronic anxiety/depression, GERD, admitted 03/22/2024 following witnessed syncopal episode at [facility]. Patient felt nauseous then became unresponsive for approximately one minute. EMS found elevated BP and bradycardia.   In ED, hypertensive (161/63) with normal HR (73). Labs notable for severe hypokalemia (K 2.3), mildly elevated high-sensitivity troponin (60 ? 58), and small bilateral pleural effusions on CXR. Received 1L LR bolus and potassium repletion.    She was admitted for syncope evaluation with echocardiogram pending and PT/OT evaluations. Her level of debility will require discharge to SNF rehabilitation which is being pursued.   Subjective: Pt reports no pain or dyspnea, no chest discomfort. Continues to require significant physical assistance to get up and will need rehabilitation.   Objective: BP (!) 171/62 (BP Location: Right Arm)   Pulse 67   Temp 98.3 F (36.8 C)   Resp 19   Ht 5' 3 (1.6 m)   Wt 77.1 kg   SpO2 97%   BMI 30.11 kg/m   Gen: No distress, elderly female Pulm: Clear, nonlabored  CV: Regular, rate 60-70's, no MRG, no pitting edema GI: Soft, NT, ND, +BS  Neuro: Alert and mostly oriented. No new focal deficits. Ext: Warm, no deformities. Skin: No rashes, lesions or ulcers on visualized skin   Pacemaker interpretation:  Dual-chamber pacemaker functioning appropriately with normal battery status, excellent lead parameters, and appropriate pacing/sensing thresholds. The device is pacing the ventricle 35% of the time, indicating moderate pacing dependency. No atrial pacing (0%) is consistent with persistent  atrial fibrillation. The pacemaker does not appear to be implicated in the syncopal event - all parameters are within normal limits with adequate safety margins. The patient's underlying rhythm shows 65% intrinsic ventricular activity, suggesting the pacemaker is providing backup support for bradycardia episodes related to sick sinus syndrome.  Echo 03/24/2023 (compared to 2019): Significant interval progression of valvular disease compared to her 2019 echo, with moderate-to-severe aortic regurgitation (worsened from moderate), moderate tricuspid regurgitation (worsened from mild), and moderately elevated pulmonary artery systolic pressure at 57 mmHg (worsened from 38 mmHg). The echo also shows severe biatrial enlargement, moderate left ventricular hypertrophy, and septal flattening consistent with right ventricular pressure and volume overload.   Assessment & Plan: Syncope: Witnessed episode with prodromal nausea followed by ~1 minute loss of consciousness. Differential includes, severe hypokalemia-induced dysrhythmia (despite pacemaker's normal functioning), orthostatic hypotension, vasovagal, and related to valvular heart disease (mod-severe AR) and pulmonary HTN.   - Patient has moderate pacing dependency (35% RV paced) but adequate intrinsic rhythm 65% of time. Continue telemetry monitoring.  - Echocardiogram shows significant progression of valvular heart disease. Will discuss with cardiology.  - Fall precautions.    Severe hypokalemia: Improving from 2.3 on admission, now 3.2 after IV and PO repletion. Likely contributing factor to syncope via arrhythmia risk.  - Continue aggressive potassium repletion again today with 40mEq BID, with goal K >4.0. Repeat BMP. Mg 1.8.  - Patient on home KCl 10 mEq daily, plan to discharge on higher dose.   Persistent atrial fibrillation: Rate controlled.  - Continue apixaban  5 mg BID for stroke prophylaxis (CHA?DS?-VASc score high given age 72, female).  -  Continue diltiazem  120 mg daily for rate control.  - Digoxin  level 1.6 ng/mL (  upper therapeutic range) - hold digoxin  given level and concern for contribution to bradycardia/syncope. Recheck digoxin  level in 2-3 days. May restart at lower dose (0.125 mg every other day) if needed for rate control, though diltiazem  alone may be sufficient. Monitor on telemetry. Needs f/u with Dr. Francyne  Mildly elevated troponin: High-sensitivity troponin 60?58, likely demand ischemia in setting of syncope/hypotension rather than ACS. No anginal symptoms. Trending down appropriately. Continue telemetry monitoring.  - No indication for antiplatelet therapy or heparin  given on apixaban .  - Pt no longer taking statin. Not inappropriate in this 89yo.   Small bilateral pleural effusions: Possibly related to hypoalbuminemia (albumin 3.0). No respiratory distress currently, SpO2 95% on RA.  - Echo shows elevated PA pressure with normal RV function and functional TR due to RA dilatation. Also mod-severe AI with holodiastolic flow reversal in descending aorta. Will need to maintain euvolemia.   Hyponatremia: Mild- Na 133 (stable, chronic).    Hypophosphatemia:  - Supplemented  Hypothyroidism  - Continue levothyroxine  100 mcg daily. Will check TSH for thoroughness.   Chronic anxiety/depression  - Continue escitalopram  20 mg daily and bupropion  150 mg daily   GERD  - Continue PPI  Chronic pain:  - Continue tramadol 50 mg q6h prn. Monitor for sedation/fall risk.  Obesity: BMI 30.   DNR/DNI: POA, noted.   Bernardino KATHEE Come, MD [facility] [email or web address] 03/24/2024, 4:15 PM

## 2024-03-24 NOTE — Progress Notes (Signed)
 Mobility Specialist Progress Note:    03/24/24 1050  Mobility  Activity Pivoted/transferred to/from [facility]  Level of Assistance Maximum assist, patient does 25-49% (+2)  Assistive Device Front wheel walker  Distance Ambulated (ft) 4 ft  Range of Motion/Exercises Active;All extremities  Activity Response Tolerated well  Mobility Referral Yes  Mobility visit 1 Mobility  Mobility Specialist Start Time (ACUTE ONLY) 1050  Mobility Specialist Stop Time (ACUTE ONLY) 1110  Mobility Specialist Time Calculation (min) (ACUTE ONLY) 20 min   Pt received in chair, NT requesting assistance with stand and pivot to [facility].  Required MaxA+2 to stand and pivot with RW. Tolerated well, c/o bilateral knee pain. Returned  to chair, nurse and NT in room. All needs met.  Malary Aylesworth Mobility Specialist Please contact via Special Educational Needs Teacher or  Rehab office at [phone]

## 2024-03-24 NOTE — TOC Progression Note (Signed)
 Transition of Care [employer]) - Progression Note    Patient Details  Name: CAYLEIGH PAULL MRN: [id number] Date of Birth: 11-12-1928  [facility] CM/SW Contact  Lucie Lunger, [place] Phone Number: 03/24/2024, 3:57 PM  Clinical Narrative:    CSW spoke with pts daughter to review bed offers. She prefers [facility]. CSW reached out to [facility] in admissions, she is going to confirm they have a bed for pt at this time. Donald will also start insurance auth for SNF. TOC to follow.   Expected Discharge Plan: Skilled Nursing Facility Barriers to Discharge: English As A Second Language Teacher, Continued Medical Work up               Expected Discharge Plan and Services In-house Referral: Clinical Social Work Discharge Planning Services: [employer] Consult Post Acute Care Choice: Skilled Nursing Facility Living arrangements for the past 2 months: Assisted Living Facility                                       Social Drivers of Health (SDOH) Interventions SDOH Screenings   Food Insecurity: No Food Insecurity (03/23/2024)  Housing: Low Risk (03/23/2024)  Transportation Needs: No Transportation Needs (03/23/2024)  Utilities: Not At Risk (03/23/2024)  Social Connections: Unknown (03/23/2024)  Recent Concern: Social Connections - Socially Isolated (01/10/2024)  Tobacco Use: Low Risk (03/23/2024)    Readmission Risk Interventions     No data to display

## 2024-03-24 NOTE — Plan of Care (Signed)
" °  Problem: Acute Rehab OT Goals (only OT should resolve) Goal: Pt. Will Perform Grooming Flowsheets (Taken 03/24/2024 1112) Pt Will Perform Grooming:  with modified independence  sitting Goal: Pt. Will Perform Upper Body Dressing Flowsheets (Taken 03/24/2024 1112) Pt Will Perform Upper Body Dressing:  with modified independence  sitting Goal: Pt. Will Perform Lower Body Dressing Flowsheets (Taken 03/24/2024 1112) Pt Will Perform Lower Body Dressing:  with min assist  with adaptive equipment  sitting/lateral leans Goal: Pt. Will Transfer To Toilet Flowsheets (Taken 03/24/2024 1112) Pt Will Transfer to Toilet:  with min assist  stand pivot transfer Goal: Pt. Will Perform Toileting-Clothing Manipulation Flowsheets (Taken 03/24/2024 1112) Pt Will Perform Toileting - Clothing Manipulation and hygiene:  with min assist  bed level  sitting/lateral leans Goal: Pt/Caregiver Will Perform Home Exercise Program Flowsheets (Taken 03/24/2024 1112) Pt/caregiver will Perform Home Exercise Program:  Increased strength  Both right and left upper extremity  Independently  Adelaida Reindel OT, MOT  "

## 2024-03-25 DIAGNOSIS — R55 Syncope and collapse: Secondary | ICD-10-CM | POA: Diagnosis not present

## 2024-03-25 LAB — BASIC METABOLIC PANEL WITH GFR
Anion gap: 9 (ref 5–15)
BUN: 7 mg/dL — ABNORMAL LOW (ref 8–23)
CO2: 28 mmol/L (ref 22–32)
Calcium: 8.1 mg/dL — ABNORMAL LOW (ref 8.9–10.3)
Chloride: 99 mmol/L (ref 98–111)
Creatinine, Ser: 0.49 mg/dL (ref 0.44–1.00)
GFR, Estimated: >60 mL/min
Glucose, Bld: 59 mg/dL — ABNORMAL LOW (ref 70–99)
Potassium: 3.4 mmol/L — ABNORMAL LOW (ref 3.5–5.1)
Sodium: 135 mmol/L (ref 135–145)

## 2024-03-25 MED ORDER — POTASSIUM CHLORIDE CRYS ER 20 MEQ PO TBCR
40.0000 meq | EXTENDED_RELEASE_TABLET | Freq: Two times a day (BID) | ORAL | Status: AC
  Administered 2024-03-25 (×2): 40 meq via ORAL
  Filled 2024-03-25 (×2): qty 2

## 2024-03-25 MED ORDER — MAGNESIUM SULFATE IN D5W 1-5 GM/100ML-% IV SOLN
1.0000 g | Freq: Once | INTRAVENOUS | Status: AC
  Administered 2024-03-25: 1 g via INTRAVENOUS
  Filled 2024-03-25: qty 100

## 2024-03-25 NOTE — Plan of Care (Signed)
   Problem: Education: Goal: Knowledge of General Education information will improve Description Including pain rating scale, medication(s)/side effects and non-pharmacologic comfort measures Outcome: Progressing   Problem: Health Behavior/Discharge Planning: Goal: Ability to manage health-related needs will improve Outcome: Progressing

## 2024-03-25 NOTE — Progress Notes (Signed)
 [facility] PROGRESS NOTE  CHANNEL PAPANDREA (DOB: Mar 01, 1929) FMW:[id number] PCP: Shona Norleen PEDLAR, MD Outpatient Specialists: Cardiology, Dr. Francyne  Brief Narrative: Denise Pugh is a 89 year old female with persistent atrial fibrillation on apixaban  and digoxin , sick sinus syndrome s/p dual-chamber pacemaker (generator changed 09/2022), hypothyroidism, chronic anxiety/depression, GERD, admitted 03/22/2024 following witnessed syncopal episode at [facility]. Patient felt nauseous then became unresponsive for approximately one minute. EMS found elevated BP and bradycardia.   In ED, hypertensive (161/63) with normal HR (73). Labs notable for severe hypokalemia (K 2.3), mildly elevated high-sensitivity troponin (60 ? 58), and small bilateral pleural effusions on CXR. Received 1L LR bolus and potassium repletion.    She was admitted for syncope evaluation with echocardiogram pending and Denise Pugh/OT evaluations. Her level of debility will require discharge to SNF rehabilitation which is being pursued. Echocardiogram revealed significant progression of valvular heart disease and pulmonary HTN for which cardiology recommended outpatient follow up.   Subjective: Denise Pugh has no complaints this morning. Still quite weak diffusely.   Objective: BP (!) 173/62   Pulse 70   Temp 98 F (36.7 C) (Oral)   Resp 18   Ht 5' 3 (1.6 m)   Wt 77.1 kg   SpO2 95%   BMI 30.11 kg/m   Gen: Chronically ill-appearing but in no distress Pulm: Clear, nonlabored  CV: II/VI soft holosystolic murmur, no gallop, no edema GI: Soft, NT, ND, +BS  Neuro: Alert and interactive. No new focal deficits. Ext: Warm, no deformities Skin: No new rashes, lesions or ulcers on visualized skin   Updated pacemaker interrogation is pending.  Echo 03/24/2023 (compared to 2019): Significant interval progression of valvular disease compared to her 2019 echo, with moderate-to-severe aortic regurgitation (worsened from moderate), moderate tricuspid  regurgitation (worsened from mild), and moderately elevated pulmonary artery systolic pressure at 57 mmHg (worsened from 38 mmHg). The echo also shows severe biatrial enlargement, moderate left ventricular hypertrophy, and septal flattening consistent with right ventricular pressure and volume overload.  Assessment & Plan: Syncope: Witnessed episode with prodromal nausea followed by ~1 minute loss of consciousness. Differential includes, severe hypokalemia-induced dysrhythmia (despite pacemaker's normal functioning), orthostatic hypotension, vasovagal, and related to valvular heart disease (mod-severe AR) and pulmonary HTN.   - Patient has moderate pacing dependency (35% RV paced) but adequate intrinsic rhythm 65% of time. Continue telemetry monitoring.  - Echocardiogram shows significant progression of valvular heart disease. Discussed and reviewed with cardiology, Dr. Mallipeddi, who recommends outpatient cardiology follow up to discuss next steps.  - Fall precautions.    Severe hypokalemia: Improving from 2.3 on admission, now 3.2 after IV and PO repletion. Likely contributing factor to syncope via arrhythmia risk.  - Continue aggressive potassium repletion again today with 40mEq BID. K coming up slowly but quite refractory. Goal K >4.0. Repeat BMP. Mg 1.9, give 1g. - Patient on home KCl 10 mEq daily, plan to discharge on higher dose.   Persistent atrial fibrillation: Rate controlled.  - Continue apixaban  5 mg BID for stroke prophylaxis (CHA?DS?-VASc score high given age 71, female).  - Continue diltiazem  120 mg daily for rate control.  - Digoxin  level 1.6 ng/mL (upper therapeutic range) - hold digoxin  given level and concern for contribution to bradycardia/syncope. Recheck digoxin  level in AM. May restart at lower dose (0.125 mg every other day) if needed for rate control, though diltiazem  alone may be sufficient. Monitor on telemetry. Needs f/u with Dr. Francyne  Mildly elevated troponin:  High-sensitivity troponin 60?58, likely demand ischemia in setting  of syncope/hypotension rather than ACS. No anginal symptoms. Trending down appropriately. Continue telemetry monitoring.  - No indication for antiplatelet therapy or heparin  given on apixaban .  - Denise Pugh no longer taking statin. Not inappropriate in this 89yo.   Small bilateral pleural effusions: Possibly related to hypoalbuminemia (albumin 3.0). No respiratory distress currently, SpO2 95% on RA.  - Echo shows elevated PA pressure with normal RV function and functional TR due to RA dilatation. Also mod-severe AI with holodiastolic flow reversal in descending aorta. Will need to maintain euvolemia.   Hyponatremia: Mild- Na 133 (stable, chronic).    Hypophosphatemia:  - Supplemented  Hypothyroidism  - Continue levothyroxine  100 mcg daily. TSH wnl.   Chronic anxiety/depression  - Continue escitalopram  20 mg daily and bupropion  150 mg daily   GERD  - Continue PPI  Chronic pain:  - Continue tramadol  50 mg q6h prn. Monitor for sedation/fall risk.  Obesity: BMI 30.   DNR/DNI: POA, noted.   Bernardino KATHEE Come, MD [facility] [email or web address] 03/25/2024, 1:13 PM

## 2024-03-25 NOTE — Plan of Care (Signed)
   Problem: Clinical Measurements: Goal: Will remain free from infection Outcome: Progressing Goal: Respiratory complications will improve Outcome: Progressing

## 2024-03-26 DIAGNOSIS — R55 Syncope and collapse: Secondary | ICD-10-CM | POA: Diagnosis not present

## 2024-03-26 LAB — BASIC METABOLIC PANEL WITH GFR
Anion gap: 10 (ref 5–15)
BUN: 6 mg/dL — ABNORMAL LOW (ref 8–23)
CO2: 27 mmol/L (ref 22–32)
Calcium: 8.3 mg/dL — ABNORMAL LOW (ref 8.9–10.3)
Chloride: 96 mmol/L — ABNORMAL LOW (ref 98–111)
Creatinine, Ser: 0.51 mg/dL (ref 0.44–1.00)
GFR, Estimated: >60 mL/min
Glucose, Bld: 57 mg/dL — ABNORMAL LOW (ref 70–99)
Potassium: 3.8 mmol/L (ref 3.5–5.1)
Sodium: 134 mmol/L — ABNORMAL LOW (ref 135–145)

## 2024-03-26 LAB — GLUCOSE, CAPILLARY: Glucose-Capillary: 99 mg/dL (ref 70–99)

## 2024-03-26 LAB — MAGNESIUM: Magnesium: 2.2 mg/dL (ref 1.7–2.4)

## 2024-03-26 LAB — DIGOXIN LEVEL: Digoxin Level: 1.3 ng/mL (ref 0.8–2.0)

## 2024-03-26 MED ORDER — POTASSIUM CHLORIDE CRYS ER 20 MEQ PO TBCR
20.0000 meq | EXTENDED_RELEASE_TABLET | Freq: Once | ORAL | Status: AC
  Administered 2024-03-26: 20 meq via ORAL
  Filled 2024-03-26: qty 1

## 2024-03-26 NOTE — Plan of Care (Signed)
   Problem: Education: Goal: Knowledge of General Education information will improve Description Including pain rating scale, medication(s)/side effects and non-pharmacologic comfort measures Outcome: Progressing   Problem: Health Behavior/Discharge Planning: Goal: Ability to manage health-related needs will improve Outcome: Progressing

## 2024-03-26 NOTE — Plan of Care (Signed)

## 2024-03-26 NOTE — Progress Notes (Signed)
 [facility] PROGRESS NOTE  Denise Pugh (DOB: 12/02/1928) FMW:[id number] PCP: Shona Norleen PEDLAR, MD Outpatient Specialists: Cardiology, Dr. Francyne  Brief Narrative: Denise Pugh is a 89 year old female with persistent atrial fibrillation on apixaban  and digoxin , sick sinus syndrome s/p dual-chamber pacemaker (generator changed 09/2022), hypothyroidism, chronic anxiety/depression, GERD, admitted 03/22/2024 following witnessed syncopal episode at [facility]. Patient felt nauseous then became unresponsive for approximately one minute. EMS found elevated BP and bradycardia.   In ED, hypertensive (161/63) with normal HR (73). Labs notable for severe hypokalemia (K 2.3), mildly elevated high-sensitivity troponin (60 ? 58), and small bilateral pleural effusions on CXR. Received 1L LR bolus and potassium repletion.    She was admitted for syncope evaluation with echocardiogram pending and PT/OT evaluations. Her level of debility will require discharge to SNF rehabilitation which is being pursued. Echocardiogram revealed significant progression of valvular heart disease and pulmonary HTN for which cardiology recommended outpatient follow up.   Subjective: Doesn't like the food, amenable to going to [facility], has been there before. No other complaints.   Objective: BP (!) 161/51 (BP Location: Right Arm)   Pulse 66   Temp 98.5 F (36.9 C) (Oral)   Resp 18   Ht 5' 3 (1.6 m)   Wt 77.1 kg   SpO2 91%   BMI 30.11 kg/m   Gen: Chronically ill-appearing but in no distress Pulm: Clear, nonlabored  CV: II/VI soft holosystolic murmur, no gallop, no edema GI: Soft, NT, ND, +BS  Neuro: Alert and interactive. No new focal deficits. Ext: Warm, no deformities Skin: No new rashes, lesions or ulcers on visualized skin   Updated pacemaker interrogation is pending.  Echo 03/24/2023 (compared to 2019): Significant interval progression of valvular disease compared to her 2019 echo, with moderate-to-severe aortic  regurgitation (worsened from moderate), moderate tricuspid regurgitation (worsened from mild), and moderately elevated pulmonary artery systolic pressure at 57 mmHg (worsened from 38 mmHg). The echo also shows severe biatrial enlargement, moderate left ventricular hypertrophy, and septal flattening consistent with right ventricular pressure and volume overload.  Assessment & Plan: Syncope: Witnessed episode with prodromal nausea followed by ~1 minute loss of consciousness. Differential includes, severe hypokalemia-induced dysrhythmia (despite pacemaker's normal functioning), orthostatic hypotension, vasovagal, and related to valvular heart disease (mod-severe AR) and pulmonary HTN.   - Patient has moderate pacing dependency (35% RV paced) but adequate intrinsic rhythm 65% of time. Continue telemetry monitoring.  - Echocardiogram shows significant progression of valvular heart disease. Discussed and reviewed with cardiology, Dr. Mallipeddi, who recommends outpatient cardiology follow up to discuss next steps.  - Fall precautions.    Severe hypokalemia: Improving from 2.3 on admission, normalized 1/4 - Repeat supplementation, recheck AM BMP.   - Patient on home KCl 10 mEq daily, plan to discharge on higher dose.   Persistent atrial fibrillation: Rate controlled.  - Continue apixaban  5 mg BID for stroke prophylaxis (CHA?DS?-VASc score high given age 79, female).  - Continue diltiazem  120 mg daily for rate control.  - Digoxin  level 1.6 ng/mL (upper therapeutic range) - held digoxin  > recheck 1/4 level is 1.3. Will consult with cardiology 1/5. Holding still given level and concern for contribution to bradycardia/syncope. May restart at lower dose (0.125 mg every other day) if needed for rate control, though diltiazem  alone may be sufficient. Monitor on telemetry. Needs f/u with Dr. Francyne  Mildly elevated troponin: High-sensitivity troponin 60?58, likely demand ischemia in setting of  syncope/hypotension rather than ACS. No anginal symptoms. Trending down appropriately. Continue  telemetry monitoring.  - No indication for antiplatelet therapy or heparin  given on apixaban .  - Pt no longer taking statin. Not inappropriate in this 89yo.   Small bilateral pleural effusions: Possibly related to hypoalbuminemia (albumin 3.0). No respiratory distress currently, SpO2 95% on RA.  - Echo shows elevated PA pressure with normal RV function and functional TR due to RA dilatation. Also mod-severe AI with holodiastolic flow reversal in descending aorta. Will need to maintain euvolemia.   Hyponatremia: Mild, chronic, stable.  Hypophosphatemia:  - Supplemented  Hypothyroidism  - Continue levothyroxine  100 mcg daily. TSH wnl.   Chronic anxiety/depression  - Continue escitalopram  20 mg daily and bupropion  150 mg daily   GERD  - Continue PPI  Chronic pain:  - Continue tramadol  50 mg q6h prn. Monitor for sedation/fall risk.  Obesity: BMI 30.   DNR/DNI: POA, noted.   Denise KATHEE Come, MD [facility] [email or web address] 03/26/2024, 7:17 PM

## 2024-03-26 NOTE — Plan of Care (Signed)
   Problem: Activity: Goal: Risk for activity intolerance will decrease Outcome: Progressing   Problem: Coping: Goal: Level of anxiety will decrease Outcome: Progressing

## 2024-03-27 DIAGNOSIS — R55 Syncope and collapse: Secondary | ICD-10-CM | POA: Diagnosis not present

## 2024-03-27 LAB — BASIC METABOLIC PANEL WITH GFR
Anion gap: 14 (ref 5–15)
BUN: 6 mg/dL — ABNORMAL LOW (ref 8–23)
CO2: 23 mmol/L (ref 22–32)
Calcium: 8.4 mg/dL — ABNORMAL LOW (ref 8.9–10.3)
Chloride: 98 mmol/L (ref 98–111)
Creatinine, Ser: 0.58 mg/dL (ref 0.44–1.00)
GFR, Estimated: >60 mL/min
Glucose, Bld: 77 mg/dL (ref 70–99)
Potassium: 3.5 mmol/L (ref 3.5–5.1)
Sodium: 134 mmol/L — ABNORMAL LOW (ref 135–145)

## 2024-03-27 MED ORDER — POTASSIUM CHLORIDE CRYS ER 20 MEQ PO TBCR: 40.0000 meq | EXTENDED_RELEASE_TABLET | Freq: Every day | ORAL | Status: DC

## 2024-03-27 MED ORDER — TRAMADOL HCL 50 MG PO TABS: 50.0000 mg | ORAL_TABLET | Freq: Two times a day (BID) | ORAL | 0 refills | Status: AC

## 2024-03-27 MED ORDER — DIGOXIN 125 MCG PO TABS: 0.0625 mg | ORAL_TABLET | Freq: Every day | ORAL | Status: DC

## 2024-03-27 NOTE — Plan of Care (Signed)
" °  Problem: Education: Goal: Knowledge of General Education information will improve Description: Including pain rating scale, medication(s)/side effects and non-pharmacologic comfort measures Outcome: Progressing   Problem: Health Behavior/Discharge Planning: Goal: Ability to manage health-related needs will improve Outcome: Progressing   Problem: Clinical Measurements: Goal: Ability to maintain clinical measurements within normal limits will improve Outcome: Progressing Goal: Will remain free from infection Outcome: Progressing Goal: Diagnostic test results will improve Outcome: Progressing Goal: Respiratory complications will improve Outcome: Progressing Goal: Cardiovascular complication will be avoided Outcome: Progressing   Problem: Activity: Goal: Risk for activity intolerance will decrease Outcome: Progressing   Problem: Nutrition: Goal: Adequate nutrition will be maintained Outcome: Progressing   Problem: Coping: Goal: Level of anxiety will decrease Outcome: Progressing   Problem: Elimination: Goal: Will not experience complications related to bowel motility Outcome: Progressing Goal: Will not experience complications related to urinary retention Outcome: Progressing   Problem: Pain Managment: Goal: General experience of comfort will improve and/or be controlled Outcome: Progressing   Problem: Safety: Goal: Ability to remain free from injury will improve Outcome: Progressing   Problem: Skin Integrity: Goal: Risk for impaired skin integrity will decrease Outcome: Progressing   Problem: Education: Goal: Knowledge of General Education information will improve Description: Including pain rating scale, medication(s)/side effects and non-pharmacologic comfort measures Outcome: Progressing   Problem: Health Behavior/Discharge Planning: Goal: Ability to manage health-related needs will improve Outcome: Progressing   Problem: Clinical Measurements: Goal:  Ability to maintain clinical measurements within normal limits will improve Outcome: Progressing Goal: Will remain free from infection Outcome: Progressing Goal: Diagnostic test results will improve Outcome: Progressing Goal: Respiratory complications will improve Outcome: Progressing Goal: Cardiovascular complication will be avoided Outcome: Progressing   Problem: Activity: Goal: Risk for activity intolerance will decrease Outcome: Progressing   Problem: Nutrition: Goal: Adequate nutrition will be maintained Outcome: Progressing   Problem: Coping: Goal: Level of anxiety will decrease Outcome: Progressing   Problem: Elimination: Goal: Will not experience complications related to bowel motility Outcome: Progressing Goal: Will not experience complications related to urinary retention Outcome: Progressing   Problem: Pain Managment: Goal: General experience of comfort will improve and/or be controlled Outcome: Progressing   Problem: Education: Goal: Knowledge of disease or condition will improve Outcome: Progressing Goal: Knowledge of the prescribed therapeutic regimen will improve Outcome: Progressing Goal: Individualized Educational Video(s) Outcome: Progressing   Problem: Activity: Goal: Ability to tolerate increased activity will improve Outcome: Progressing Goal: Will verbalize the importance of balancing activity with adequate rest periods Outcome: Progressing   Problem: Respiratory: Goal: Ability to maintain a clear airway will improve Outcome: Progressing Goal: Levels of oxygenation will improve Outcome: Progressing Goal: Ability to maintain adequate ventilation will improve Outcome: Progressing   "

## 2024-03-27 NOTE — TOC Progression Note (Signed)
 Transition of Care [facility]) - Progression Note    Patient Details  Name: Denise Pugh MRN: [id number] Date of Birth: 05-10-1928  Transition of Care [facility]) CM/SW Contact  Lucie Lunger, [place] Phone Number: 03/27/2024, 12:52 PM  Clinical Narrative:    CSW updated by Donald with [facility] that they are no longer able to offer bed for pt at this time. Donald states that [facility] does not have any female beds at this time. CSW sent SNF referral out further at this time. TOC to follow.   Expected Discharge Plan: Skilled Nursing Facility Barriers to Discharge: English As A Second Language Teacher, Continued Medical Work up               Expected Discharge Plan and Services In-house Referral: Clinical Social Work Discharge Planning Services: CM Consult Post Acute Care Choice: Skilled Nursing Facility Living arrangements for the past 2 months: Assisted Living Facility Expected Discharge Date: 03/27/24                                     Social Drivers of Health (SDOH) Interventions SDOH Screenings   Food Insecurity: No Food Insecurity (03/23/2024)  Housing: Low Risk (03/23/2024)  Transportation Needs: No Transportation Needs (03/23/2024)  Utilities: Not At Risk (03/23/2024)  Social Connections: Unknown (03/23/2024)  Recent Concern: Social Connections - Socially Isolated (01/10/2024)  Tobacco Use: Low Risk (03/23/2024)    Readmission Risk Interventions     No data to display

## 2024-03-27 NOTE — Discharge Summary (Signed)
 " Physician Discharge Summary   Patient: Denise Pugh MRN: [id number] DOB: 1928/09/02  Admit date:     03/22/2024  Discharge date: 03/27/2024  Discharge Physician: Bernardino KATHEE Come   PCP: Shona Norleen PEDLAR, MD   Recommendations at discharge:  Monitor BMP, note increased daily supplementation of potassium from 10mEq daily to 40mEq daily  Follow up with cardiology, follow up arranged 1/19 with Lorette Kapur, PA-C at 1:45pm. Note progression of valvular heart disease, deferred management to outpatient setting. Note digoxin  level 1.6, decreased dose from 0.125mg  daily > 0.0625 daily.  Repeat pacemaker interrogation recommended  Discharge Diagnoses: Principal Problem:   Syncope  Hospital Course: Denise Pugh is a 89 year old female with persistent atrial fibrillation on apixaban  and digoxin , sick sinus syndrome s/p dual-chamber pacemaker (generator changed 09/2022), hypothyroidism, chronic anxiety/depression, GERD, admitted 03/22/2024 following witnessed syncopal episode at [facility]. Patient felt nauseous then became unresponsive for approximately one minute. EMS found elevated BP and bradycardia.    In ED, hypertensive (161/63) with normal HR (73). Labs notable for severe hypokalemia (K 2.3), mildly elevated high-sensitivity troponin (60 ? 58), and small bilateral pleural effusions on CXR. Received 1L LR bolus and potassium repletion.     She was admitted for syncope evaluation with echocardiogram pending and PT/OT evaluations. Her level of debility will require discharge to SNF rehabilitation which is being pursued. Echocardiogram revealed significant progression of valvular heart disease and pulmonary HTN for which cardiology recommended outpatient follow up.   Echo 03/24/2023 (compared to 2019): Significant interval progression of valvular disease compared to her 2019 echo, with moderate-to-severe aortic regurgitation (worsened from moderate), moderate tricuspid regurgitation (worsened from mild), and  moderately elevated pulmonary artery systolic pressure at 57 mmHg (worsened from 38 mmHg). The echo also shows severe biatrial enlargement, moderate left ventricular hypertrophy, and septal flattening consistent with right ventricular pressure and volume overload.   Assessment and Plan: Syncope: Witnessed episode with prodromal nausea followed by ~1 minute loss of consciousness. Differential includes, severe hypokalemia-induced dysrhythmia (despite pacemaker's normal functioning), orthostatic hypotension, vasovagal, and related to valvular heart disease (mod-severe AR) and pulmonary HTN.   - Patient has moderate pacing dependency (35% RV paced) but adequate intrinsic rhythm 65% of time.  - Echocardiogram shows significant progression of valvular heart disease. Discussed and reviewed with cardiology, Dr. Mallipeddi, who recommends outpatient cardiology follow up to discuss next steps.  - Fall precautions.     Severe hypokalemia: Improving from 2.3 on admission, normalized 1/4 with significant replacement. Based on trend and supplementation during hospitalization, will augment home supp from 10mEq daily to 40mEq daily.  - Recheck BMP later this week to modify supplementation needs. Avoid hypokalemia.    Persistent atrial fibrillation: Rate controlled.  - Continue apixaban  5 mg BID for stroke prophylaxis (CHA?DS?-VASc score high given age 44, female).  - Continue diltiazem  120 mg daily for rate control.  - Digoxin  level 1.6 ng/mL (upper therapeutic range) - held digoxin  > recheck 1/4 level is 1.3. Decrease dose to 0.0625mg  with concern for contribution to bradycardia/syncope. Needs f/u with Dr. Francyne   Mildly elevated troponin: High-sensitivity troponin 60?58, likely demand ischemia in setting of syncope/hypotension rather than ACS. No anginal symptoms. Trending down appropriately. Continue telemetry monitoring.  - No indication for antiplatelet therapy or heparin  given on apixaban .  - Pt no longer  taking statin. Not inappropriate in this 89yo.    Small bilateral pleural effusions: Possibly related to hypoalbuminemia (albumin 3.0). No respiratory distress currently, SpO2 95% on RA.  - Echo  shows elevated PA pressure with normal RV function and functional TR due to RA dilatation. Also mod-severe AI with holodiastolic flow reversal in descending aorta. Will need to maintain euvolemia.    Hyponatremia: Mild, chronic, stable.   Hypophosphatemia:  - Supplemented   Hypothyroidism  - Continue levothyroxine  100 mcg daily. TSH wnl.    Chronic anxiety/depression  - Continue escitalopram  20 mg daily and bupropion  150 mg daily    GERD  - Continue PPI   Chronic pain:  - Continue tramadol  50 mg BID per PTA routine. Monitor for sedation/fall risk.   Obesity: BMI 30.11.    DNR/DNI: POA, noted.   Consultants: Cardiology Procedures performed: Echo  Disposition: Skilled nursing facility Diet recommendation:  Cardiac diet DISCHARGE MEDICATION: Allergies as of 03/27/2024       Reactions   Penicillins Other (See Comments)   Has patient had a PCN reaction causing immediate rash, facial/tongue/throat swelling, SOB or lightheadedness with hypotension: Unknown Has patient had a PCN reaction causing severe rash involving mucus membranes or skin necrosis: Unknown Has patient had a PCN reaction that required hospitalization: Unknown Has patient had a PCN reaction occurring within the last 10 years: Unknown If all of the above answers are NO, then may proceed with Cephalosporin use.   Tetanus Toxoid Swelling        Medication List     STOP taking these medications    atorvastatin  20 MG tablet Commonly known as: LIPITOR   metoprolol  tartrate 100 MG tablet Commonly known as: LOPRESSOR        TAKE these medications    acetaminophen  500 MG tablet Commonly known as: TYLENOL  Take 500 mg by mouth every 6 (six) hours as needed for moderate pain, fever or headache.   apixaban  5 MG  Tabs tablet Commonly known as: Eliquis  Take 1 tablet (5 mg total) by mouth 2 (two) times daily. NEEDS CARDIOLOGY APPT FOR REFILLS, CALL OFFICE [phone]   buPROPion  75 MG tablet Commonly known as: WELLBUTRIN  Take 150 mg by mouth every morning.   digoxin  0.125 MG tablet Commonly known as: LANOXIN  Take 0.5 tablets (0.0625 mg total) by mouth daily. What changed: how much to take   diltiazem  120 MG 24 hr capsule Commonly known as: CARDIZEM  CD Take 1 capsule (120 mg total) by mouth daily.   escitalopram  20 MG tablet Commonly known as: LEXAPRO  Take 1 tablet (20 mg total) by mouth daily.   esomeprazole 40 MG capsule Commonly known as: NEXIUM Take 40 mg by mouth daily.   levothyroxine  100 MCG tablet Commonly known as: SYNTHROID  Take 100 mcg by mouth daily before breakfast.   melatonin 3 MG Tabs tablet Take 2 tablets (6 mg total) by mouth at bedtime as needed. What changed: when to take this   nystatin  powder Commonly known as: MYCOSTATIN /NYSTOP  Apply 1 Application topically 2 (two) times daily as needed (comfort).   OPTIC-VITES PO Take 2 tablets by mouth every morning.   oxybutynin  5 MG tablet Commonly known as: DITROPAN  Take 5 mg by mouth 2 (two) times daily.   potassium chloride  SA 20 MEQ tablet Commonly known as: KLOR-CON  M Take 2 tablets (40 mEq total) by mouth daily. What changed:  medication strength how much to take   QC Bacitracin  500 UNIT/GM ointment Generic drug: bacitracin  Apply 1 Application topically 2 (two) times daily. Apply to face, nose, chin, scalp topically two times a day for scratching.   senna 8.6 MG tablet Commonly known as: SENOKOT Take 1 tablet by mouth 2 (two)  times daily.   traMADol  50 MG tablet Commonly known as: ULTRAM  Take 1 tablet (50 mg total) by mouth 2 (two) times daily. 0700 and 1900 What changed: Another medication with the same name was removed. Continue taking this medication, and follow the directions you see here.    Vitamin D  50 MCG (2000 UT) tablet Take 2,000 Units by mouth every morning.   Voltaren  1 % Gel Generic drug: diclofenac  Sodium Apply 2 g topically 2 (two) times daily. Apply to right knee and shoulder topically every morning and at bedtime for pain.        Follow-up Information     Shona Norleen PEDLAR, MD Follow up.   Specialty: Internal Medicine Contact information: [address] [place] [facility] [address] [phone]         Croitoru, Jerel, MD Follow up.   Specialty: Cardiology               Discharge Exam: Filed Weights   03/22/24 1615  Weight: 77.1 kg  BP (!) 157/70 (BP Location: Right Arm)   Pulse 79   Temp 97.9 F (36.6 C) (Axillary)   Resp 20   Ht 5' 3 (1.6 m)   Wt 77.1 kg   SpO2 92%   BMI 30.11 kg/m   Gen: Chronically ill-appearing but in no distress Pulm: Clear, nonlabored  CV: II/VI soft holosystolic murmur, no gallop, no edema GI: Soft, NT, ND, +BS  Neuro: Alert and interactive. No new focal deficits.  Condition at discharge: stable  The results of significant diagnostics from this hospitalization (including imaging, microbiology, ancillary and laboratory) are listed below for reference.   Imaging Studies: ECHOCARDIOGRAM COMPLETE Result Date: 03/24/2024    ECHOCARDIOGRAM REPORT   Patient Name:   Denise Pugh Date of Exam: 03/23/2024 Medical Rec #:  [id number]     Height:       63.0 in Accession #:    [id number]    Weight:       170.0 lb Date of Birth:  [birth year]     BSA:          1.804 m Patient Age:    89 years      BP:           161/59 mmHg Patient Gender: F             HR:           74 bpm. Exam Location:  [facility] Procedure: 2D Echo, Cardiac Doppler and Color Doppler (Both Spectral and Color            Flow Doppler were utilized during procedure). Indications:    Syncope R55  History:        Patient has prior history of Echocardiogram examinations, most                 recent 06/18/2017. Pacemaker, Arrythmias:Atrial Fibrillation,                  Signs/Symptoms:Syncope; Risk Factors:Dyslipidemia and                 Hypertension.  Sonographer:    Aida Pizza RCS Referring Phys: [id number] CAROLE N HALL IMPRESSIONS  1. Left ventricular ejection fraction, by estimation, is 60 to 65%. The left ventricle has normal function. The left ventricle has no regional wall motion abnormalities. There is moderate left ventricular hypertrophy of the septal segment. Diastolic function could not be evaluated due to paced rhythm. There is the interventricular  septum is flattened in systole and diastole, consistent with right ventricular pressure and volume overload.  2. Right ventricular systolic function is normal. The right ventricular size is normal. There is moderately elevated pulmonary artery systolic pressure. The estimated right ventricular systolic pressure is 56.7 mmHg.  3. Left atrial size was severely dilated.  4. Right atrial size was severely dilated.  5. The mitral valve is degenerative. Mild mitral valve regurgitation. No evidence of mitral stenosis.  6. The tricuspid valve is abnormal. Tricuspid valve regurgitation is moderate, functional from severe right atrial enlargement.  7. The aortic valve is tricuspid. Aortic valve regurgitation is moderate to severe. No evidence of holodiastolic flow reversal noted in the descending aorta. Mild aortic valve stenosis. Aortic regurgitation PHT measures 518 msec. Aortic valve area, by VTI measures 1.13 cm. Aortic valve mean gradient measures 9.0 mmHg. Aortic valve Vmax measures 2.17 m/s.  8. The inferior vena cava is dilated in size with >50% respiratory variability, suggesting right atrial pressure of 8 mmHg. Comparison(s): No prior Echocardiogram. FINDINGS  Left Ventricle: Left ventricular ejection fraction, by estimation, is 60 to 65%. The left ventricle has normal function. The left ventricle has no regional wall motion abnormalities. Strain was performed and the global longitudinal strain is indeterminate. The left  ventricular internal cavity size was normal in size. There is moderate left ventricular hypertrophy of the septal segment. The interventricular septum is flattened in systole and diastole, consistent with right ventricular pressure and volume overload. Left ventricular diastolic function could not be evaluated due to paced rhythm. Diastolic function could not be evaluated due to paced rhythm. Normal left ventricular filling pressure. Right Ventricle: The right ventricular size is normal. No increase in right ventricular wall thickness. Right ventricular systolic function is normal. There is moderately elevated pulmonary artery systolic pressure. The tricuspid regurgitant velocity is 3.49 m/s, and with an assumed right atrial pressure of 8 mmHg, the estimated right ventricular systolic pressure is 56.7 mmHg. Left Atrium: Left atrial size was severely dilated. Right Atrium: Right atrial size was severely dilated. Pericardium: There is no evidence of pericardial effusion. Mitral Valve: The mitral valve is degenerative in appearance. Mild mitral valve regurgitation. No evidence of mitral valve stenosis. Tricuspid Valve: The tricuspid valve is abnormal. Tricuspid valve regurgitation is moderate . No evidence of tricuspid stenosis. Aortic Valve: The aortic valve is tricuspid. Aortic valve regurgitation is moderate to severe. Aortic regurgitation PHT measures 518 msec. Mild aortic stenosis is present. Aortic valve mean gradient measures 9.0 mmHg. Aortic valve peak gradient measures 18.9 mmHg. Aortic valve area, by VTI measures 1.13 cm. Pulmonic Valve: The pulmonic valve was normal in structure. Pulmonic valve regurgitation is trivial. No evidence of pulmonic stenosis. Aorta: The aortic root is normal in size and structure. Venous: The inferior vena cava is dilated in size with greater than 50% respiratory variability, suggesting right atrial pressure of 8 mmHg. IAS/Shunts: No atrial level shunt detected by color flow  Doppler. Additional Comments: 3D was performed not requiring image post processing on an independent workstation and was indeterminate. A device lead is visualized.  LEFT VENTRICLE PLAX 2D LVIDd:         4.40 cm LVIDs:         2.90 cm LV PW:         1.10 cm LV IVS:        1.30 cm LVOT diam:     1.60 cm LV SV:         44 LV SV Index:  25 LVOT Area:     2.01 cm  RIGHT VENTRICLE TAPSE (M-mode): 1.6 cm LEFT ATRIUM             Index        RIGHT ATRIUM           Index LA diam:        4.85 cm 2.69 cm/m   RA Area:     31.50 cm LA Vol (A2C):   98.4 ml 54.53 ml/m  RA Volume:   114.00 ml 63.18 ml/m LA Vol (A4C):   84.2 ml 46.66 ml/m LA Biplane Vol: 92.5 ml 51.26 ml/m  AORTIC VALVE AV Area (Vmax):    1.20 cm AV Area (Vmean):   1.19 cm AV Area (VTI):     1.13 cm AV Vmax:           217.50 cm/s AV Vmean:          138.000 cm/s AV VTI:            0.390 m AV Peak Grad:      18.9 mmHg AV Mean Grad:      9.0 mmHg LVOT Vmax:         130.00 cm/s LVOT Vmean:        81.800 cm/s LVOT VTI:          0.220 m LVOT/AV VTI ratio: 0.56 AI PHT:            518 msec  AORTA Ao Root diam: 3.10 cm MITRAL VALVE                TRICUSPID VALVE MV Area (PHT): 4.60 cm     TR Peak grad:   48.7 mmHg MV Decel Time: 165 msec     TR Vmax:        349.00 cm/s MR Peak grad: 114.1 mmHg MR Mean grad: 79.0 mmHg     SHUNTS MR Vmax:      534.00 cm/s   Systemic VTI:  0.22 m MR Vmean:     417.0 cm/s    Systemic Diam: 1.60 cm MV E velocity: 152.00 cm/s MV A velocity: 49.80 cm/s MV E/A ratio:  3.05 Vishnu Priya Mallipeddi Electronically signed by Diannah Late Mallipeddi Signature Date/Time: 03/24/2024/3:13:01 PM    Final    DG Chest Portable 1 View Result Date: 03/22/2024 EXAM: 1 VIEW(S) XRAY OF THE CHEST 03/22/2024 04:49:00 PM COMPARISON: None available. CLINICAL HISTORY: Syncope FINDINGS: LINES, TUBES AND DEVICES: Left pectoral face of the device. LUNGS AND PLEURA: Small bilateral pleural effusions with associated basilar atelectasis. Pneumonia is not  excluded. Stable clinically. No pneumothorax. HEART AND MEDIASTINUM: Left pectoral pacemaker device. No acute abnormality of the cardiac and mediastinal silhouettes. BONES AND SOFT TISSUES: No acute osseous abnormality. IMPRESSION: 1. Small bilateral pleural effusions with associated basilar atelectasis. Pneumonia not excluded. Electronically signed by: Vanetta Chou MD 03/22/2024 05:47 PM EST RP Workstation: [id number]   CUP PACEART REMOTE DEVICE CHECK Result Date: 03/03/2024 PPM Scheduled remote reviewed. Normal device function.  Presenting rhythm:  AF/VP Known AF, Eliquis  per PA report 344 fast AV c/w AF with rapid ventricular response, longest duration , 23sec, HR's 140-200.  Recent improvement in rates per trends Next remote transmission per protocol. LA, CVRS  DG Chest Portable 1 View Result Date: 02/27/2024 CLINICAL DATA:  AMS. EXAM: PORTABLE CHEST 1 VIEW COMPARISON:  01/09/2024. FINDINGS: The heart size and mediastinal contours are unchanged. Aortic atherosclerosis. Chronic interstitial coarsening. No acute airspace consolidation, sizeable pleural effusion, or  pneumothorax. No acute osseous abnormality. IMPRESSION: No acute cardiopulmonary findings. Electronically Signed   By: Harrietta Sherry M.D.   On: 02/27/2024 10:14    Microbiology: Results for orders placed or performed during the hospital encounter of 01/09/24  MRSA Next Gen by PCR, Nasal     Status: None   Collection Time: 01/10/24  1:09 AM   Specimen: Nasal Mucosa; Nasal Swab  Result Value Ref Range Status   MRSA by PCR Next Gen NOT DETECTED NOT DETECTED Final    Comment: (NOTE) The GeneXpert MRSA Assay (FDA approved for NASAL specimens only), is one component of a comprehensive MRSA colonization surveillance program. It is not intended to diagnose MRSA infection nor to guide or monitor treatment for MRSA infections. Test performance is not FDA approved in patients less than 26 years old. Performed at [facility], [address] [place], [place] [address]     Labs: CBC: Recent Labs  Lab 03/22/24 1656 03/23/24 0104  WBC 9.9 8.6  NEUTROABS 6.0  --   HGB 14.5 13.3  HCT 42.1 38.9  MCV 88.8 88.6  PLT 202 192   Basic Metabolic Panel: Recent Labs  Lab 03/22/24 1656 03/22/24 2200 03/23/24 0104 03/24/24 0432 03/25/24 0414 03/26/24 0451 03/27/24 0545  NA 133*   < > 133* 133* 135 134* 134*  K 2.3*   < > 3.2* 3.3* 3.4* 3.8 3.5  CL 94*   < > 96* 96* 99 96* 98  CO2 24   < > 30 29 28 27 23   GLUCOSE 136*   < > 86 68* 59* 57* 77  BUN 10   < > 9 7* 7* 6* 6*  CREATININE 0.61   < > 0.49 0.49 0.49 0.51 0.58  CALCIUM  8.5*   < > 8.1* 8.2* 8.1* 8.3* 8.4*  MG 1.8  --   --  1.9  --  2.2  --   PHOS  --   --  2.4*  --   --   --   --    < > = values in this interval not displayed.   Liver Function Tests: Recent Labs  Lab 03/22/24 1656  AST 26  ALT 13  ALKPHOS 124  BILITOT 1.5*  PROT 6.8  ALBUMIN 3.0*   CBG: Recent Labs  Lab 03/26/24 2155  GLUCAP 99    Discharge time spent: greater than 30 minutes.  Signed: Bernardino KATHEE Come, MD [facility] 03/27/2024 "

## 2024-03-28 NOTE — TOC Progression Note (Signed)
 Transition of Care [facility]) - Progression Note    Patient Details  Name: Denise Pugh MRN: [id number] Date of Birth: Jun 27, 1928  [facility]) CM/SW Contact  Lucie Lunger, [place] Phone Number: 03/28/2024, 11:10 AM  Clinical Narrative:    CSW reached out to [place] with [facility] to see if pts chart can be reviewed again for possible placement as there are no other SNF options at this time. Donald states that the denial decision is firm at this time. CSW sent pts referral out further at this time. TOC to follow.   Expected Discharge Plan: Skilled Nursing Facility Barriers to Discharge: English As A Second Language Teacher, Continued Medical Work up               Expected Discharge Plan and Services In-house Referral: Clinical Social Work Discharge Planning Services: CM Consult Post Acute Care Choice: Skilled Nursing Facility Living arrangements for the past 2 months: Assisted Living Facility Expected Discharge Date: 03/27/24                                     Social Drivers of Health (SDOH) Interventions SDOH Screenings   Food Insecurity: No Food Insecurity (03/23/2024)  Housing: Low Risk (03/23/2024)  Transportation Needs: No Transportation Needs (03/23/2024)  Utilities: Not At Risk (03/23/2024)  Social Connections: Unknown (03/23/2024)  Recent Concern: Social Connections - Socially Isolated (01/10/2024)  Tobacco Use: Low Risk (03/23/2024)    Readmission Risk Interventions     No data to display

## 2024-03-28 NOTE — TOC Progression Note (Signed)
 Transition of Care [facility]) - Progression Note    Patient Details  Name: Denise Pugh MRN: [id number] Date of Birth: 04-Jul-1928  Transition of Care [facility]) CM/SW Contact  Lucie Lunger, [place] Phone Number: 03/28/2024, 4:07 PM  Clinical Narrative:    CSW notes that [facility] offered SNF bed for pt. CSW spoke with pts daughter to provide update, she accepts bed at this time. CSW left VM for admissions and sent secure message in [facility] to request they begin auth for pt as she is medically stable. TOC to follow.   Expected Discharge Plan: Skilled Nursing Facility Barriers to Discharge: No SNF bed               Expected Discharge Plan and Services In-house Referral: Clinical Social Work Discharge Planning Services: CM Consult Post Acute Care Choice: Skilled Nursing Facility Living arrangements for the past 2 months: Assisted Living Facility Expected Discharge Date: 03/27/24                                     Social Drivers of Health (SDOH) Interventions SDOH Screenings   Food Insecurity: No Food Insecurity (03/23/2024)  Housing: Low Risk (03/23/2024)  Transportation Needs: No Transportation Needs (03/23/2024)  Utilities: Not At Risk (03/23/2024)  Social Connections: Unknown (03/23/2024)  Recent Concern: Social Connections - Socially Isolated (01/10/2024)  Tobacco Use: Low Risk (03/23/2024)    Readmission Risk Interventions     No data to display

## 2024-03-28 NOTE — Plan of Care (Signed)
" °  Problem: Education: Goal: Knowledge of General Education information will improve Description: Including pain rating scale, medication(s)/side effects and non-pharmacologic comfort measures Outcome: Progressing   Problem: Health Behavior/Discharge Planning: Goal: Ability to manage health-related needs will improve Outcome: Progressing   Problem: Clinical Measurements: Goal: Ability to maintain clinical measurements within normal limits will improve Outcome: Progressing Goal: Will remain free from infection Outcome: Progressing Goal: Diagnostic test results will improve Outcome: Progressing Goal: Respiratory complications will improve Outcome: Progressing Goal: Cardiovascular complication will be avoided Outcome: Progressing   Problem: Activity: Goal: Risk for activity intolerance will decrease Outcome: Progressing   Problem: Nutrition: Goal: Adequate nutrition will be maintained Outcome: Progressing   Problem: Coping: Goal: Level of anxiety will decrease Outcome: Progressing   Problem: Pain Managment: Goal: General experience of comfort will improve and/or be controlled Outcome: Progressing   Problem: Safety: Goal: Ability to remain free from injury will improve Outcome: Progressing   Problem: Education: Goal: Knowledge of General Education information will improve Description: Including pain rating scale, medication(s)/side effects and non-pharmacologic comfort measures Outcome: Progressing   Problem: Pain Managment: Goal: General experience of comfort will improve and/or be controlled Outcome: Progressing   Problem: Pain Managment: Goal: General experience of comfort will improve and/or be controlled Outcome: Progressing   "

## 2024-03-28 NOTE — Progress Notes (Signed)
 Patient seen on rounds this morning, no complaints or changes since discharge yesterday. CSW still working diligently on SNF placement. The patient continues to be medically stable for discharge currently.   Bernardino Come, MD 03/28/2024 11:56 AM

## 2024-03-28 NOTE — Plan of Care (Signed)
" °  Problem: Clinical Measurements: Goal: Ability to maintain clinical measurements within normal limits will improve Outcome: Progressing Goal: Diagnostic test results will improve Outcome: Progressing   Problem: Activity: Goal: Risk for activity intolerance will decrease Outcome: Progressing   Problem: Pain Managment: Goal: General experience of comfort will improve and/or be controlled Outcome: Progressing   Problem: Safety: Goal: Ability to remain free from injury will improve Outcome: Progressing   Problem: Skin Integrity: Goal: Risk for impaired skin integrity will decrease Outcome: Progressing   Problem: Education: Goal: Knowledge of General Education information will improve Description: Including pain rating scale, medication(s)/side effects and non-pharmacologic comfort measures Outcome: Progressing   "

## 2024-03-29 DIAGNOSIS — R55 Syncope and collapse: Secondary | ICD-10-CM | POA: Diagnosis not present

## 2024-03-29 DIAGNOSIS — I495 Sick sinus syndrome: Secondary | ICD-10-CM | POA: Diagnosis not present

## 2024-03-29 DIAGNOSIS — E876 Hypokalemia: Secondary | ICD-10-CM | POA: Diagnosis not present

## 2024-03-29 DIAGNOSIS — I4819 Other persistent atrial fibrillation: Secondary | ICD-10-CM | POA: Diagnosis not present

## 2024-03-29 DIAGNOSIS — K219 Gastro-esophageal reflux disease without esophagitis: Secondary | ICD-10-CM | POA: Diagnosis not present

## 2024-03-29 NOTE — Plan of Care (Signed)
  Problem: Education: Goal: Knowledge of General Education information will improve Description: Including pain rating scale, medication(s)/side effects and non-pharmacologic comfort measures Outcome: Progressing   Problem: Health Behavior/Discharge Planning: Goal: Ability to manage health-related needs will improve Outcome: Progressing   Problem: Clinical Measurements: Goal: Ability to maintain clinical measurements within normal limits will improve Outcome: Progressing Goal: Will remain free from infection Outcome: Progressing Goal: Diagnostic test results will improve Outcome: Progressing Goal: Respiratory complications will improve Outcome: Progressing Goal: Cardiovascular complication will be avoided Outcome: Progressing   Problem: Activity: Goal: Risk for activity intolerance will decrease Outcome: Progressing   Problem: Nutrition: Goal: Adequate nutrition will be maintained Outcome: Progressing   Problem: Coping: Goal: Level of anxiety will decrease Outcome: Progressing   Problem: Elimination: Goal: Will not experience complications related to bowel motility Outcome: Progressing Goal: Will not experience complications related to urinary retention Outcome: Progressing   Problem: Pain Managment: Goal: General experience of comfort will improve and/or be controlled Outcome: Progressing   Problem: Safety: Goal: Ability to remain free from injury will improve Outcome: Progressing   Problem: Skin Integrity: Goal: Risk for impaired skin integrity will decrease Outcome: Progressing   Problem: Education: Goal: Knowledge of General Education information will improve Description: Including pain rating scale, medication(s)/side effects and non-pharmacologic comfort measures Outcome: Progressing   Problem: Health Behavior/Discharge Planning: Goal: Ability to manage health-related needs will improve Outcome: Progressing   Problem: Clinical Measurements: Goal:  Ability to maintain clinical measurements within normal limits will improve Outcome: Progressing Goal: Will remain free from infection Outcome: Progressing Goal: Diagnostic test results will improve Outcome: Progressing Goal: Respiratory complications will improve Outcome: Progressing Goal: Cardiovascular complication will be avoided Outcome: Progressing   Problem: Activity: Goal: Risk for activity intolerance will decrease Outcome: Progressing   Problem: Nutrition: Goal: Adequate nutrition will be maintained Outcome: Progressing   Problem: Coping: Goal: Level of anxiety will decrease Outcome: Progressing   Problem: Elimination: Goal: Will not experience complications related to bowel motility Outcome: Progressing Goal: Will not experience complications related to urinary retention Outcome: Progressing   Problem: Pain Managment: Goal: General experience of comfort will improve and/or be controlled Outcome: Progressing   Problem: Safety: Goal: Ability to remain free from injury will improve Outcome: Progressing   Problem: Skin Integrity: Goal: Risk for impaired skin integrity will decrease Outcome: Progressing   Problem: Education: Goal: Knowledge of disease or condition will improve Outcome: Progressing Goal: Knowledge of the prescribed therapeutic regimen will improve Outcome: Progressing Goal: Individualized Educational Video(s) Outcome: Progressing   Problem: Activity: Goal: Ability to tolerate increased activity will improve Outcome: Progressing Goal: Will verbalize the importance of balancing activity with adequate rest periods Outcome: Progressing   Problem: Respiratory: Goal: Ability to maintain a clear airway will improve Outcome: Progressing Goal: Levels of oxygenation will improve Outcome: Progressing Goal: Ability to maintain adequate ventilation will improve Outcome: Progressing

## 2024-03-29 NOTE — Progress Notes (Signed)
 [facility] PROGRESS NOTE  Denise Pugh (DOB: 06-18-1928) FMW:[id number] PCP: Shona Norleen PEDLAR, MD Outpatient Specialists: Cardiology, Dr. Francyne  Brief Narrative: Denise Pugh is a 89 year old female with persistent atrial fibrillation on apixaban  and digoxin , sick sinus syndrome s/p dual-chamber pacemaker (generator changed 09/2022), hypothyroidism, chronic anxiety/depression, GERD, admitted 03/22/2024 following witnessed syncopal episode at [facility]. Patient felt nauseous then became unresponsive for approximately one minute. EMS found elevated BP and bradycardia.   In ED, hypertensive (161/63) with normal HR (73). Labs notable for severe hypokalemia (K 2.3), mildly elevated high-sensitivity troponin (60 ? 58), and small bilateral pleural effusions on CXR. Received 1L LR bolus and potassium repletion.    She was admitted for syncope evaluation with echocardiogram pending and PT/OT evaluations. Her level of debility will require discharge to SNF rehabilitation which is being pursued. Echocardiogram revealed significant progression of valvular heart disease and pulmonary HTN for which cardiology recommended outpatient follow up.   Subjective: -No new concerns - Appetite is not great No fever  Or chills   No Nausea, Vomiting or Diarrhea  NB!! Patient was discharged to SNF facility on 03/27/2024--- please see full discharge summary dated 03/27/2024 -- Patient remains medically stable for discharge to SNF as of 03/29/2024 - Awaiting insurance authorization for discharge to SNF facility   Objective: BP 128/64 (BP Location: Right Arm)   Pulse 88   Temp 98.6 F (37 C) (Oral)   Resp 17   Ht 5' 3 (1.6 m)   Wt 77.1 kg   SpO2 94%   BMI 30.11 kg/m   Gen: Chronically ill-appearing but in no distress Pulm: Clear, nonlabored  CV: II/VI soft holosystolic murmur, no gallop, no edema GI: Soft, NT, ND, +BS  Neuro: Alert and interactive. No new focal deficits. Ext: Warm, no deformities Skin: No  new rashes, lesions or ulcers on visualized skin   Updated pacemaker interrogation is pending.  Echo 03/24/2023 (compared to 2019): Significant interval progression of valvular disease compared to her 2019 echo, with moderate-to-severe aortic regurgitation (worsened from moderate), moderate tricuspid regurgitation (worsened from mild), and moderately elevated pulmonary artery systolic pressure at 57 mmHg (worsened from 38 mmHg). The echo also shows severe biatrial enlargement, moderate left ventricular hypertrophy, and septal flattening consistent with right ventricular pressure and volume overload. - NB!! Patient was discharged to SNF facility on 03/27/2024--- please see full discharge summary dated 03/27/2024 -- Patient remains medically stable for discharge to SNF as of 03/29/2024 - Awaiting insurance authorization for discharge to SNF facility  Assessment & Plan: Syncope: Witnessed episode with prodromal nausea followed by ~1 minute loss of consciousness. Differential includes, severe hypokalemia-induced dysrhythmia (despite pacemaker's normal functioning), orthostatic hypotension, vasovagal, and related to valvular heart disease (mod-severe AR) and pulmonary HTN.   - Patient has moderate pacing dependency (35% RV paced) but adequate intrinsic rhythm 65% of time. Continue telemetry monitoring.  - Echocardiogram shows significant progression of valvular heart disease. Discussed and reviewed with cardiology, Dr. Mallipeddi, who recommends outpatient cardiology follow up to discuss next steps.  - Fall precautions.    Severe hypokalemia: Improving from 2.3 on admission, normalized 1/4 - Repeat supplementation, recheck AM BMP.   - Patient on home KCl 10 mEq daily, plan to discharge on higher dose.   Persistent atrial fibrillation: Rate controlled.  - Continue apixaban  5 mg BID for stroke prophylaxis (CHA?DS?-VASc score high given age 19, female).  - Continue diltiazem  120 mg daily for rate  control.  - Digoxin  level 1.6 ng/mL (upper therapeutic  range) - held digoxin  > recheck 1/4 level is 1.3. Will consult with cardiology 1/5. Holding still given level and concern for contribution to bradycardia/syncope. May restart at lower dose (0.125 mg every other day) if needed for rate control, though diltiazem  alone may be sufficient. Monitor on telemetry. Needs f/u with Dr. Francyne  Mildly elevated troponin: High-sensitivity troponin 60?58, likely demand ischemia in setting of syncope/hypotension rather than ACS. No anginal symptoms. Trending down appropriately. Continue telemetry monitoring.  - No indication for antiplatelet therapy or heparin  given on apixaban .  - Pt no longer taking statin. Not inappropriate in this 89yo.   Small bilateral pleural effusions: Possibly related to hypoalbuminemia (albumin 3.0). No respiratory distress currently, SpO2 95% on RA.  - Echo shows elevated PA pressure with normal RV function and functional TR due to RA dilatation. Also mod-severe AI with holodiastolic flow reversal in descending aorta. Will need to maintain euvolemia.   Hyponatremia: Mild, chronic, stable.  Hypophosphatemia:  - Supplemented  Hypothyroidism  - Continue levothyroxine  100 mcg daily. TSH wnl.   Chronic anxiety/depression  - Continue escitalopram  20 mg daily and bupropion  150 mg daily   GERD  - Continue PPI  Chronic pain:  - Continue tramadol  50 mg q6h prn. Monitor for sedation/fall risk.  Class 1 Obesity- -Low calorie diet, portion control and increase physical activity discussed with patient -Body mass index is 30.11 kg/m.   DNR/DNI: POA, noted.   Rendall Carwin, MD [facility] [email or web address] 03/29/2024, 5:58 PM

## 2024-03-29 NOTE — Progress Notes (Signed)
 Physical Therapy Treatment Patient Details Name: Denise Pugh MRN: [id number] DOB: [birth year] Today's Date: 03/29/2024   History of Present Illness Denise Pugh is a 89 y.o. female with medical history significant for persistent atrial fibrillation on Eliquis  and digoxin , hypothyroidism, chronic anxiety/depression, GERD, sick sinus syndrome status post pacemaker placement, chronic anxiety, who presented to the ER after a witnessed syncopal episode by family at SNF.  Family reports, the patient was unresponsive for about a minute.  Reportedly, she initially felt nauseous prior to losing consciousness.  EMS was activated.  Upon EMS arrival, the patient's blood pressure was elevated and she was bradycardic.  No reported chest pain or palpitations.    PT Comments  Patent agreeable for therapy. Patient demonstrates slow labored movement for sitting up at bedside with most difficulty scooting to EOB due BLE pain. Patient demonstrates fair/good return for completing BLE ROM/strengthening exercises with verbal cueing, tolerated taking a few side steps before having to sit due to BLE weakness, pain and fall risk. Patient tolerated sitting up in chair after therapy. Patient will benefit from continued skilled physical therapy in hospital and recommended venue below to increase strength, balance, endurance for safe ADLs and gait.        If plan is discharge home, recommend the following: A lot of help with walking and/or transfers;A lot of help with bathing/dressing/bathroom;Assist for transportation;Assistance with cooking/housework;Supervision due to cognitive status;Help with stairs or ramp for entrance   Can travel by private vehicle     No  Equipment Recommendations  None recommended by PT    Recommendations for Other Services       Precautions / Restrictions Precautions Precautions: Fall Recall of Precautions/Restrictions: Impaired Restrictions Weight Bearing Restrictions Per Provider Order:  No     Mobility  Bed Mobility Overal bed mobility: Needs Assistance Bed Mobility: Supine to Sit     Supine to sit: Mod assist, Max assist, HOB elevated     General bed mobility comments: slow labored movment with c/o severe pain BLE with movement, had most diffiuclty scooting to EOB due to weakness    Transfers Overall transfer level: Needs assistance Equipment used: Rolling walker (2 wheels) Transfers: Sit to/from Stand, Bed to chair/wheelchair/BSC Sit to Stand: Max assist, Mod assist   Step pivot transfers: Mod assist, Max assist       General transfer comment: unsteady labored movement, BLE weakness    Ambulation/Gait Ambulation/Gait assistance: Max assist Gait Distance (Feet): 3 Feet Assistive device: Rolling walker (2 wheels) Gait Pattern/deviations: Decreased step length - right, Decreased step length - left, Decreased stride length, Shuffle Gait velocity: slow     General Gait Details: limited to a few slow labored shuffling side steps using RW due to increasing BLE pain and c/o weakness   Stairs             Wheelchair Mobility     Tilt Bed    Modified Rankin (Stroke Patients Only)       Balance Overall balance assessment: Needs assistance Sitting-balance support: Feet supported, No upper extremity supported Sitting balance-Leahy Scale: Fair Sitting balance - Comments: fair/good seated at EOB   Standing balance support: Reliant on assistive device for balance, During functional activity, Bilateral upper extremity supported Standing balance-Leahy Scale: Poor Standing balance comment: using RW                            Communication Communication Communication: No apparent difficulties  Cognition Arousal:  Alert Behavior During Therapy: [facility] for tasks assessed/performed, Anxious                             Following commands: Impaired Following commands impaired: Follows one step commands with increased time     Cueing Cueing Techniques: Verbal cues, Tactile cues  Exercises General Exercises - Lower Extremity Long Arc Quad: Seated, AROM, Strengthening, Both, 10 reps Hip Flexion/Marching: Seated, AROM, Strengthening, Both, 10 reps Toe Raises: Seated, AROM, Strengthening, Both, 10 reps Heel Raises: Seated, AROM, Strengthening, Both, 10 reps    General Comments        Pertinent Vitals/Pain Pain Assessment Pain Assessment: Faces Faces Pain Scale: Hurts even more Pain Location: BLE with pressure, movement Pain Descriptors / Indicators: Discomfort, Grimacing, Guarding, Moaning, Sore Pain Intervention(s): Limited activity within patient's tolerance, Monitored during session, Repositioned    Home Living                          Prior Function            PT Goals (current goals can now be found in the care plan section) Acute Rehab PT Goals Patient Stated Goal: return home after rehab stay PT Goal Formulation: With patient Time For Goal Achievement: 04/06/24 Potential to Achieve Goals: Good Progress towards PT goals: Progressing toward goals    Frequency    Min 3X/week      PT Plan      Co-evaluation              AM-PAC PT 6 Clicks Mobility   Outcome Measure  Help needed turning from your back to your side while in a flat bed without using bedrails?: A Little Help needed moving from lying on your back to sitting on the side of a flat bed without using bedrails?: A Lot Help needed moving to and from a bed to a chair (including a wheelchair)?: A Lot Help needed standing up from a chair using your arms (e.g., wheelchair or bedside chair)?: A Lot Help needed to walk in hospital room?: A Lot Help needed climbing 3-5 steps with a railing? : Total 6 Click Score: 12    End of Session   Activity Tolerance: Patient tolerated treatment well;Patient limited by fatigue Patient left: in chair;with call bell/phone within reach;with chair alarm set Nurse Communication:  Mobility status PT Visit Diagnosis: Difficulty in walking, not elsewhere classified (R26.2);Other abnormalities of gait and mobility (R26.89);Unsteadiness on feet (R26.81);Muscle weakness (generalized) (M62.81);History of falling (Z91.81);Pain     Time: 1120-1145 PT Time Calculation (min) (ACUTE ONLY): 25 min  Charges:    $Therapeutic Exercise: 8-22 mins $Therapeutic Activity: 8-22 mins PT General Charges $$ ACUTE PT VISIT: 1 Visit                     12:08 PM, 03/29/2024 Lynwood Music, MPT Physical Therapist with [facility] [phone] office [phone] mobile phone

## 2024-03-29 NOTE — Plan of Care (Signed)
" °  Problem: Education: Goal: Knowledge of General Education information will improve Description: Including pain rating scale, medication(s)/side effects and non-pharmacologic comfort measures Outcome: Progressing   Problem: Health Behavior/Discharge Planning: Goal: Ability to manage health-related needs will improve Outcome: Progressing   Problem: Clinical Measurements: Goal: Ability to maintain clinical measurements within normal limits will improve Outcome: Progressing   Problem: Activity: Goal: Risk for activity intolerance will decrease Outcome: Progressing   Problem: Pain Managment: Goal: General experience of comfort will improve and/or be controlled Outcome: Progressing   Problem: Safety: Goal: Ability to remain free from injury will improve Outcome: Progressing   Problem: Skin Integrity: Goal: Risk for impaired skin integrity will decrease Outcome: Progressing   Problem: Safety: Goal: Ability to remain free from injury will improve Outcome: Progressing   Problem: Education: Goal: Knowledge of disease or condition will improve Outcome: Progressing   "

## 2024-03-30 DIAGNOSIS — R55 Syncope and collapse: Secondary | ICD-10-CM | POA: Diagnosis not present

## 2024-03-30 LAB — RENAL FUNCTION PANEL
Albumin: 2.7 g/dL — ABNORMAL LOW (ref 3.5–5.0)
Anion gap: 7 (ref 5–15)
BUN: 11 mg/dL (ref 8–23)
CO2: 32 mmol/L (ref 22–32)
Calcium: 8.1 mg/dL — ABNORMAL LOW (ref 8.9–10.3)
Chloride: 96 mmol/L — ABNORMAL LOW (ref 98–111)
Creatinine, Ser: 0.64 mg/dL (ref 0.44–1.00)
GFR, Estimated: >60 mL/min
Glucose, Bld: 75 mg/dL (ref 70–99)
Phosphorus: 3.5 mg/dL (ref 2.5–4.6)
Potassium: 2.4 mmol/L — CL (ref 3.5–5.1)
Sodium: 135 mmol/L (ref 135–145)

## 2024-03-30 MED ORDER — POTASSIUM CHLORIDE CRYS ER 20 MEQ PO TBCR
40.0000 meq | EXTENDED_RELEASE_TABLET | Freq: Once | ORAL | Status: AC
  Administered 2024-03-30: 40 meq via ORAL
  Filled 2024-03-30: qty 2

## 2024-03-30 MED ORDER — POTASSIUM CHLORIDE 10 MEQ/100ML IV SOLN
10.0000 meq | INTRAVENOUS | Status: AC
  Administered 2024-03-30 (×3): 10 meq via INTRAVENOUS
  Filled 2024-03-30 (×3): qty 100

## 2024-03-30 NOTE — Progress Notes (Signed)
 Physical Therapy Treatment Patient Details Name: Denise Pugh MRN: [id number] DOB: 1929-03-11 Today's Date: 03/30/2024   History of Present Illness Denise Pugh is a 89 y.o. female with medical history significant for persistent atrial fibrillation on Eliquis  and digoxin , hypothyroidism, chronic anxiety/depression, GERD, sick sinus syndrome status post pacemaker placement, chronic anxiety, who presented to the ER after a witnessed syncopal episode by family at SNF.  Family reports, the patient was unresponsive for about a minute.  Reportedly, she initially felt nauseous prior to losing consciousness.  EMS was activated.  Upon EMS arrival, the patient's blood pressure was elevated and she was bradycardic.  No reported chest pain or palpitations.    PT Comments  Pt c/o pain with all LE movements then asked where she hurt her response was no pain.  Multimodal cueing to improve bed mobility with UE reaching for bed rails.  Max A with bed mobility and SPT x2 for safety.  Delayed response when asked questions and required increased time to follow command.  EOS pt left in chair with call bell within reach and chair alarm set.   If plan is discharge home, recommend the following:     Can travel by private vehicle        Equipment Recommendations       Recommendations for Other Services       Precautions / Restrictions Precautions Precautions: Fall Recall of Precautions/Restrictions: Impaired     Mobility  Bed Mobility Overal bed mobility: Needs Assistance Bed Mobility: Supine to Sit     Supine to sit: Max assist, HOB elevated     General bed mobility comments: slow labored movment with c/o severe pain BLE with movement, had most diffiuclty scooting to EOB due to weakness    Transfers Overall transfer level: Needs assistance Equipment used: Rolling walker (2 wheels)   Sit to Stand: Max assist, +2 physical assistance Stand pivot transfers: Max assist, Total assist          General transfer comment: unsteady labored movement, BLE weakness    Ambulation/Gait                   Stairs             Wheelchair Mobility     Tilt Bed    Modified Rankin (Stroke Patients Only)       Balance                                            Communication    Cognition Arousal: Alert Behavior During Therapy: WFL for tasks assessed/performed, Anxious   PT - Cognitive impairments: No family/caregiver present to determine baseline                       PT - Cognition Comments: Pt oriented to self only. Disoriented to place, or situation Following commands: Impaired Following commands impaired: Follows one step commands with increased time    Cueing Cueing Techniques: Verbal cues, Tactile cues  Exercises      General Comments        Pertinent Vitals/Pain Pain Assessment Pain Assessment: No/denies pain    Home Living                          Prior Function  PT Goals (current goals can now be found in the care plan section)      Frequency           PT Plan      Co-evaluation              AM-PAC PT 6 Clicks Mobility   Outcome Measure  Help needed turning from your back to your side while in a flat bed without using bedrails?: A Little Help needed moving from lying on your back to sitting on the side of a flat bed without using bedrails?: A Lot Help needed moving to and from a bed to a chair (including a wheelchair)?: A Lot Help needed standing up from a chair using your arms (e.g., wheelchair or bedside chair)?: A Lot Help needed to walk in hospital room?: A Lot Help needed climbing 3-5 steps with a railing? : Total 6 Click Score: 12    End of Session Equipment Utilized During Treatment: Gait belt Activity Tolerance: Patient tolerated treatment well;Patient limited by fatigue Patient left: in chair;with call bell/phone within reach;with chair alarm set;with  family/visitor present Nurse Communication: Mobility status PT Visit Diagnosis: Difficulty in walking, not elsewhere classified (R26.2);Other abnormalities of gait and mobility (R26.89);Unsteadiness on feet (R26.81);Muscle weakness (generalized) (M62.81);History of falling (Z91.81);Pain     Time: 1310-1335 PT Time Calculation (min) (ACUTE ONLY): 25 min  Charges:    $Therapeutic Activity: 23-37 mins PT General Charges $$ ACUTE PT VISIT: 1 Visit                     Augustin Mclean, LPTA/CLT; CBIS [phone]  Mclean Augustin Amble 03/30/2024, 1:41 PM

## 2024-03-30 NOTE — Plan of Care (Signed)
" °  Problem: Education: Goal: Knowledge of General Education information will improve Description: Including pain rating scale, medication(s)/side effects and non-pharmacologic comfort measures Outcome: Progressing   Problem: Health Behavior/Discharge Planning: Goal: Ability to manage health-related needs will improve Outcome: Progressing   Problem: Clinical Measurements: Goal: Ability to maintain clinical measurements within normal limits will improve Outcome: Progressing Goal: Will remain free from infection Outcome: Progressing Goal: Diagnostic test results will improve Outcome: Progressing Goal: Respiratory complications will improve Outcome: Progressing Goal: Cardiovascular complication will be avoided Outcome: Progressing   Problem: Nutrition: Goal: Adequate nutrition will be maintained Outcome: Progressing   Problem: Elimination: Goal: Will not experience complications related to bowel motility Outcome: Progressing Goal: Will not experience complications related to urinary retention Outcome: Progressing   Problem: Pain Managment: Goal: General experience of comfort will improve and/or be controlled Outcome: Progressing   Problem: Safety: Goal: Ability to remain free from injury will improve Outcome: Progressing   Problem: Skin Integrity: Goal: Risk for impaired skin integrity will decrease Outcome: Progressing   Problem: Education: Goal: Knowledge of General Education information will improve Description: Including pain rating scale, medication(s)/side effects and non-pharmacologic comfort measures Outcome: Progressing   Problem: Health Behavior/Discharge Planning: Goal: Ability to manage health-related needs will improve Outcome: Progressing   Problem: Activity: Goal: Risk for activity intolerance will decrease Outcome: Progressing   Problem: Nutrition: Goal: Adequate nutrition will be maintained Outcome: Progressing   Problem: Coping: Goal: Level of  anxiety will decrease Outcome: Progressing   Problem: Elimination: Goal: Will not experience complications related to bowel motility Outcome: Progressing Goal: Will not experience complications related to urinary retention Outcome: Progressing   Problem: Pain Managment: Goal: General experience of comfort will improve and/or be controlled Outcome: Progressing   Problem: Safety: Goal: Ability to remain free from injury will improve Outcome: Progressing   Problem: Skin Integrity: Goal: Risk for impaired skin integrity will decrease Outcome: Progressing   Problem: Education: Goal: Knowledge of disease or condition will improve Outcome: Progressing Goal: Knowledge of the prescribed therapeutic regimen will improve Outcome: Progressing   Problem: Activity: Goal: Ability to tolerate increased activity will improve Outcome: Progressing Goal: Will verbalize the importance of balancing activity with adequate rest periods Outcome: Progressing   Problem: Respiratory: Goal: Ability to maintain a clear airway will improve Outcome: Progressing Goal: Levels of oxygenation will improve Outcome: Progressing Goal: Ability to maintain adequate ventilation will improve Outcome: Progressing   "

## 2024-03-30 NOTE — Progress Notes (Signed)
 [facility] PROGRESS NOTE  Denise Pugh (DOB: 1929/02/08) FMW:[id number] PCP: Shona Norleen PEDLAR, MD Outpatient Specialists: Cardiology, Dr. Francyne  Brief Narrative: Denise Pugh is a 89 year old female with persistent atrial fibrillation on apixaban  and digoxin , sick sinus syndrome s/p dual-chamber pacemaker (generator changed 09/2022), hypothyroidism, chronic anxiety/depression, GERD, admitted 03/22/2024 following witnessed syncopal episode at [facility]. Patient felt nauseous then became unresponsive for approximately one minute. EMS found elevated BP and bradycardia.   In ED, hypertensive (161/63) with normal HR (73). Labs notable for severe hypokalemia (K 2.3), mildly elevated high-sensitivity troponin (60 ? 58), and small bilateral pleural effusions on CXR. Received 1L LR bolus and potassium repletion.    She was admitted for syncope evaluation with echocardiogram pending and PT/OT evaluations. Her level of debility will require discharge to SNF rehabilitation which is being pursued. Echocardiogram revealed significant progression of valvular heart disease and pulmonary HTN for which cardiology recommended outpatient follow up.   Subjective: -No new concerns - Appetite is not great No fever  Or chills  - Low potassium noted but no emesis or diarrhea  NB!! Patient was discharged to SNF facility on 03/27/2024--- please see full discharge summary dated 03/27/2024 -- Patient remains medically stable for discharge to SNF as of 03/30/2024 - Awaiting insurance authorization for discharge to SNF facility   Objective: BP (!) 144/63 (BP Location: Right Arm)   Pulse 74   Temp 99.1 F (37.3 C) (Oral)   Resp 20   Ht 5' 3 (1.6 m)   Wt 77.1 kg   SpO2 93%   BMI 30.11 kg/m   Gen: Chronically ill-appearing but in no distress Pulm: Clear, nonlabored  CV: II/VI soft holosystolic murmur, no gallop, no edema, pacemaker in situ GI: Soft, NT, ND, +BS  Neuro: Alert and interactive. No new focal  deficits. Ext: Warm, no deformities Skin: No new rashes, lesions or ulcers on visualized skin   Assessment & Plan: 1)Syncope: Witnessed episode with prodromal nausea followed by ~1 minute loss of consciousness. Differential includes, severe hypokalemia-induced dysrhythmia (despite pacemaker's normal functioning), orthostatic hypotension, vasovagal, and related to valvular heart disease (mod-severe AR) and pulmonary HTN.   - Patient has moderate pacing dependency (35% RV paced) but adequate intrinsic rhythm 65% of time. Continue telemetry monitoring.  - Echocardiogram shows significant progression of valvular heart disease. Discussed and reviewed with cardiology, Dr. Mallipeddi, who recommends outpatient cardiology follow up to discuss next steps.  - Fall precautions.   -Echo 03/24/2023 (compared to 2019): Significant interval progression of valvular disease compared to her 2019 echo, with moderate-to-severe aortic regurgitation (worsened from moderate), moderate tricuspid regurgitation (worsened from mild), and moderately elevated pulmonary artery systolic pressure at 57 mmHg (worsened from 38 mmHg). The echo also shows severe biatrial enlargement, moderate left ventricular hypertrophy, and septal flattening consistent with right ventricular pressure and volume overload.  Hypokalemia: Replace -- Patient on home KCl 10 mEq daily, plan to discharge on higher dose.   Persistent atrial fibrillation: Rate controlled.  - Continue apixaban  5 mg BID for stroke prophylaxis (CHA?DS?-VASc score high given age 58, female).  - Continue diltiazem  120 mg daily for rate control.  - Discontinue digoxin   -needs f/u with Dr. Francyne  Mildly elevated troponin: High-sensitivity troponin 60?58, likely demand ischemia in setting of syncope/hypotension rather than ACS. No anginal symptoms. Trending down appropriately. Continue telemetry monitoring.  - No indication for antiplatelet therapy or heparin  given on apixaban .   - Pt no longer taking statin.   Small bilateral pleural effusions: Possibly  related to hypoalbuminemia (albumin 3.0). No respiratory distress currently, SpO2 95% on RA.  - Echo shows elevated PA pressure with normal RV function and functional TR due to RA dilatation. Also mod-severe AI with holodiastolic flow reversal in descending aorta. Will need to maintain euvolemia.   Hyponatremia: Mild, chronic, stable.  Hypophosphatemia:  - Supplemented  Hypothyroidism  - Continue levothyroxine  100 mcg daily. TSH wnl.   Chronic anxiety/depression  - Continue escitalopram  20 mg daily and bupropion  150 mg daily   GERD  - Continue PPI  Chronic pain:  - Continue tramadol  50 mg q6h prn. Monitor for sedation/fall risk.  Class 1 Obesity- -Low calorie diet, portion control and increase physical activity discussed with patient -Body mass index is 30.11 kg/m.  DNR/DNI: POA, noted.   Disposition:- NB!! Patient was discharged to SNF facility on 03/27/2024--- please see full discharge summary dated 03/27/2024 -- Patient remains medically stable for discharge to SNF as of 03/30/2024 - Awaiting insurance authorization for discharge to SNF facility  Rendall Carwin, MD [facility] [email or web address] 03/30/2024, 4:43 PM

## 2024-03-30 NOTE — Progress Notes (Signed)
 Potassium was ordered.   03/30/24 [id number]  Provider Notification  Provider Name/Title Dr. Manfred  Date Provider Notified 03/30/24  Time Provider Notified [phone]  Method of Notification Page  Notification Reason Critical Result (2.4 potassium)  Provider response Other (Comment) (awaiting response)  Date of Provider Response 03/30/24  Time of Provider Response [phone]

## 2024-03-30 NOTE — Progress Notes (Signed)
 Mobility Specialist Progress Note:    03/30/24 1222  Mobility  Activity Pivoted/transferred from bed to chair  Level of Assistance Maximum assist, patient does 25-49% (+2)  Assistive Device None  Distance Ambulated (ft) 2 ft  Range of Motion/Exercises Active;All extremities  Activity Response Tolerated well  Mobility Referral Yes  Mobility visit 1 Mobility  Mobility Specialist Start Time (ACUTE ONLY) 1222  Mobility Specialist Stop Time (ACUTE ONLY) 1240  Mobility Specialist Time Calculation (min) (ACUTE ONLY) 18 min   Pt received in bed, agreeable to mobility. Asked physical therapy assistant for help during session. Required MaxA+2 to stand and pivot with no AD. Tolerated well, c/o BLE pain. Alarm on, call bell in reach. All needs met.  Marquesa Rath Mobility Specialist Please contact via Special Educational Needs Teacher or  Rehab office at [phone]

## 2024-03-30 NOTE — Plan of Care (Signed)

## 2024-03-30 NOTE — TOC Progression Note (Signed)
 Transition of Care [facility]) - Progression Note    Patient Details  Name: Denise Pugh MRN: [id number] Date of Birth: June 18, 1928  Transition of [facility]) CM/SW Contact  Hoy DELENA Bigness, LCSW Phone Number: 03/30/2024, 11:37 AM  Clinical Narrative:    [name] for SNF still pending.    Expected Discharge Plan: Skilled Nursing Facility Barriers to Discharge: No SNF bed               Expected Discharge Plan and Services In-house Referral: Clinical Social Work Discharge Planning Services: CM Consult Post Acute Care Choice: Skilled Nursing Facility Living arrangements for the past 2 months: Assisted Living Facility Expected Discharge Date: 03/30/24                                     Social Drivers of Health (SDOH) Interventions SDOH Screenings   Food Insecurity: No Food Insecurity (03/23/2024)  Housing: Low Risk (03/23/2024)  Transportation Needs: No Transportation Needs (03/23/2024)  Utilities: Not At Risk (03/23/2024)  Social Connections: Unknown (03/23/2024)  Recent Concern: Social Connections - Socially Isolated (01/10/2024)  Tobacco Use: Low Risk (03/23/2024)    Readmission Risk Interventions     No data to display

## 2024-03-31 DIAGNOSIS — I4819 Other persistent atrial fibrillation: Secondary | ICD-10-CM | POA: Diagnosis not present

## 2024-03-31 DIAGNOSIS — E876 Hypokalemia: Secondary | ICD-10-CM

## 2024-03-31 DIAGNOSIS — K219 Gastro-esophageal reflux disease without esophagitis: Secondary | ICD-10-CM | POA: Diagnosis not present

## 2024-03-31 DIAGNOSIS — I495 Sick sinus syndrome: Secondary | ICD-10-CM | POA: Diagnosis not present

## 2024-03-31 DIAGNOSIS — R55 Syncope and collapse: Secondary | ICD-10-CM | POA: Diagnosis not present

## 2024-03-31 MED ORDER — POTASSIUM CHLORIDE CRYS ER 20 MEQ PO TBCR: 20.0000 meq | EXTENDED_RELEASE_TABLET | Freq: Every day | ORAL | 4 refills | Status: AC

## 2024-03-31 MED ORDER — SENNOSIDES 8.6 MG PO TABS: 2.0000 | ORAL_TABLET | Freq: Every day | ORAL | 3 refills | Status: AC

## 2024-03-31 MED ORDER — DILTIAZEM HCL ER COATED BEADS 180 MG PO CP24: 180.0000 mg | ORAL_CAPSULE | Freq: Every day | ORAL | 11 refills | Status: AC

## 2024-03-31 MED ORDER — ENSURE COMPLETE PO LIQD: 237.0000 mL | Freq: Two times a day (BID) | ORAL | 3 refills | Status: AC

## 2024-03-31 NOTE — TOC Transition Note (Signed)
 Transition of Care [facility]) - Discharge Note   Patient Details  Name: Denise Pugh MRN: [id number] Date of Birth: 05-Jun-1928  [facility] CM/SW Contact:  Hoy DELENA Bigness, LCSW Phone Number: 03/31/2024, 11:32 AM   Clinical Narrative:    [name] approved for SNF. Pt going to [facility] room 137. Call report to [phone]. CSW spoke with pt's daughter and confirmed discharge plans. [facility] called at 11:30am for transportation.    Final next level of care: Skilled Nursing Facility Barriers to Discharge: Barriers Resolved   Patient Goals and CMS Choice Patient states their goals for this hospitalization and ongoing recovery are:: return home CMS Medicare.gov Compare Post Acute Care list provided to:: Patient Represenative (must comment) Choice offered to / list presented to : Adult Children      Discharge Placement   Existing PASRR number confirmed : 03/23/24          Patient chooses bed at: [facility] Patient to be transferred to facility by: [facility] Name of family member notified: Daughter, Cy Patient and family notified of of transfer: 03/31/24  Discharge Plan and Services Additional resources added to the After Visit Summary for   In-house Referral: Clinical Social Work Discharge Planning Services: CM Consult Post Acute Care Choice: Skilled Nursing Facility          DME Arranged: N/A DME Agency: NA                  Social Drivers of Health (SDOH) Interventions SDOH Screenings   Food Insecurity: No Food Insecurity (03/23/2024)  Housing: Low Risk (03/23/2024)  Transportation Needs: No Transportation Needs (03/23/2024)  Utilities: Not At Risk (03/23/2024)  Social Connections: Unknown (03/23/2024)  Recent Concern: Social Connections - Socially Isolated (01/10/2024)  Tobacco Use: Low Risk (03/23/2024)     Readmission Risk Interventions     No data to display

## 2024-03-31 NOTE — Discharge Instructions (Signed)
 1)Watch for bleeding while on Blood Thinners--watch for blood in your stool which can make your stool black, maroon, mahogany or red---, blood in your urine which can make your urine pink or red, nosebleeds , also watch for possible bruising -You are taking Apixaban /Eliquis --- which is a blood thinner--- be careful to avoid injury or falls  2)Avoid ibuprofen/Advil/Aleve/Motrin/Goody Powders/Naproxen/BC powders/Meloxicam/Diclofenac /Indomethacin and other Nonsteroidal anti-inflammatory medications as these will make you more likely to bleed and can cause stomach ulcers, can also cause Kidney problems.   3) repeat CBC and BMP blood test in about 4 to 5 days from now  4)Patient  needs a lot of encouragement to eat and drink--- please give Ensure nutritional supplements 1 can 3 times daily

## 2024-03-31 NOTE — Discharge Summary (Signed)
 "                                                                                 Denise Pugh, is a 89 y.o. female  DOB November 23, 1928  MRN [id number].  Admission date:  03/22/2024  Admitting Physician  Terry LOISE Hurst, DO  Discharge Date:  03/31/2024   Primary MD  Hurst Norleen PEDLAR, MD  Recommendations for primary care physician for things to follow:  1)Watch for bleeding while on Blood Thinners--watch for blood in your stool which can make your stool black, maroon, mahogany or red---, blood in your urine which can make your urine pink or red, nosebleeds , also watch for possible bruising -You are taking Apixaban /Eliquis --- which is a blood thinner--- be careful to avoid injury or falls  2)Avoid ibuprofen/Advil/Aleve/Motrin/Goody Powders/Naproxen/BC powders/Meloxicam/Diclofenac /Indomethacin and other Nonsteroidal anti-inflammatory medications as these will make you more likely to bleed and can cause stomach ulcers, can also cause Kidney problems.   3) repeat CBC and BMP blood test in about 4 to 5 days from now  4)Patient  needs a lot of encouragement to eat and drink--- please give Ensure nutritional supplements 1 can 3 times daily  Admission Diagnosis  Hypokalemia [E87.6] Syncope [R55] Elevated troponin [R79.89] Syncope, unspecified syncope type [R55]   Discharge Diagnosis  Hypokalemia [E87.6] Syncope [R55] Elevated troponin [R79.89] Syncope, unspecified syncope type [R55]   Principal Problem:   Syncope Active Problems:   Tachycardia-bradycardia syndrome (HCC)   S/P cardiac pacemaker procedure: Dr. Francyne: Generator [employer] Advisa MRI    Essential hypertension   GERD (gastroesophageal reflux disease)   Persistent atrial fibrillation (HCC)   Pacemaker   Hypokalemia      Past Medical History:  Diagnosis Date   Arrhythmia    HX of SVT. CARDIONET MONITOR 07/15/12 TO 08/13/12   Arthritis    Family history of adverse reaction to anesthesia 1970s   mother   GERD (gastroesophageal  reflux disease)    Hyperlipidemia 08/06/2011   lexiscan myoview-normal. Due to severe attenuation the study specificity and sensitivty are deminished.   Hypertension 08/25/2010   ECHO-EF 60-65%   Hypothyroidism    Irregular heart beat    Presence of permanent cardiac pacemaker    Shortness of breath    Sleep apnea    SLEEP STUDY-[facility]   Thyroid  disease     Past Surgical History:  Procedure Laterality Date   CARDIAC CATHETERIZATION  07/06/01   spade-like configuration   CARDIOVERSION N/A 06/07/2017   Procedure: CARDIOVERSION;  Surgeon: Francyne Headland, MD;  Location: [facility];  Service: Cardiovascular;  Laterality: N/A;   CARDIOVERSION N/A 04/14/2018   Procedure: CARDIOVERSION;  Surgeon: Francyne Headland, MD;  Location: [facility];  Service: Cardiovascular;  Laterality: N/A;   DILATION AND CURETTAGE OF UTERUS     EYE SURGERY     KNEE ARTHROSCOPY     PERMANENT PACEMAKER INSERTION N/A 07/20/2013   Procedure: PERMANENT PACEMAKER INSERTION;  Surgeon: Headland Francyne, MD;  Location: [facility] CATH LAB;  Service: Cardiovascular;  Laterality: N/A;   PPM GENERATOR CHANGEOUT N/A 10/15/2022   Procedure: PPM GENERATOR CHANGEOUT;  Surgeon: Francyne Headland, MD;  Location: [facility] INVASIVE CV  LAB;  Service: Cardiovascular;  Laterality: N/A;     HPI  from the history and physical done on the day of admission:   Chief Complaint: Syncope   HPI: Denise Pugh is a 89 y.o. female with medical history significant for persistent atrial fibrillation on Eliquis  and digoxin , hypothyroidism, chronic anxiety/depression, GERD, sick sinus syndrome status post pacemaker placement, chronic anxiety, who presented to the ER after a witnessed syncopal episode by family at SNF.  Family reports, the patient was unresponsive for about a minute.  Reportedly, she initially felt nauseous prior to losing consciousness.  EMS was activated.  Upon EMS arrival, the patient's blood pressure was elevated and she  was bradycardic.  No reported chest pain or palpitations.   In the ER, BP 161/63, with rate of 73.  The patient has a pacemaker in place, pending interrogation.  Lab studies were notable for hypokalemia with potassium of 2.3.  High-sensitivity troponin 60, 58.   Chest x-ray showed small bilateral pleural effusions.   The patient received IV fluid bolus LR x 1 L, p.o. KCl 40 mg x 1.  TRH, hospitalist service, was asked to admit for syncope.   ED Course: Temperature 98.  BP 126/100, pulse 62, respiratory rate 22, O2 saturation 91% on room air.   Review of Systems: Review of systems as noted in the HPI. All other systems reviewed and are negative.   Hospital Course:   Assessment and Plan: 1)Syncope: Witnessed episode with prodromal nausea followed by ~1 minute loss of consciousness. Differential includes, severe hypokalemia-induced dysrhythmia (despite pacemaker's normal functioning), orthostatic hypotension, vasovagal, and related to valvular heart disease (mod-severe AR) and pulmonary HTN.   - Patient has moderate pacing dependency (35% RV paced) but adequate intrinsic rhythm 65% of time. Continue telemetry monitoring.  - Echocardiogram shows significant progression of valvular heart disease. Discussed and reviewed with cardiology, Dr. Mallipeddi, who recommends outpatient cardiology follow up to discuss next steps.  - Fall precautions.   -Echo 03/24/2023 (compared to 2019): Significant interval progression of valvular disease compared to her 2019 echo, with moderate-to-severe aortic regurgitation (worsened from moderate), moderate tricuspid regurgitation (worsened from mild), and moderately elevated pulmonary artery systolic pressure at 57 mmHg (worsened from 38 mmHg). The echo also shows severe biatrial enlargement, moderate left ventricular hypertrophy, and septal flattening consistent with right ventricular pressure and volume overload.   2)Recurrent Hypokalemia: Replace -- PTA patient was on  KCl 10 meq, okay to discharge on KCl 20 meq daily   3)Persistent Atrial Fibrillation: Rate controlled.  - Continue apixaban  5 mg BID for stroke prophylaxis (CHA?DS?-VASc score high given age 85, female).  -Increase Cardizem  CD to 180 mg daily for rate control - Discontinued digoxin   -needs f/u with Dr. Francyne   4)Mildly elevated troponin: High-sensitivity troponin 60?58, likely demand ischemia in setting of syncope/hypotension rather than ACS. No anginal symptoms. Trending down appropriately.  - No indication for antiplatelet therapy or heparin  given on apixaban .  - Pt no longer taking statin.    5)Small bilateral pleural effusions: Possibly related to hypoalbuminemia (albumin 3.0). No respiratory distress currently, SpO2 95% on RA.  - Echo shows elevated PA pressure with normal RV function and functional TR due to RA dilatation. Also mod-severe AI with holodiastolic flow reversal in descending aorta.  --No evidence of volume overload at this time Hyponatremia: Mild, chronic, stable.   6)Hypothyroidism  - Continue levothyroxine  100 mcg daily. TSH wnl.    7)Chronic anxiety/depression  - Continue escitalopram  20 mg daily and  bupropion  150 mg daily    8)GERD  - Continue Nexium   9)Chronic pain:  - Continue Tramadol  50 mg q6h prn. Monitor for sedation/fall risk.   10)Class 1 Obesity- -Low calorie diet, portion control and increase physical activity discussed with patient -Body mass index is 30.11 kg/m.   DNR/DNI: POA, noted.    Disposition:-Discharge to SNF facility  Discharge Condition: Stable without hypoxia  Follow UP   Contact information for follow-up providers     Shona Norleen PEDLAR, MD Follow up.   Specialty: Internal Medicine Contact information: [address] [place] [facility] [address] [phone]         Croitoru, Jerel, MD Follow up.   Specialty: Cardiology             Contact information for after-discharge care     Destination     [facility] .   Service: Skilled Nursing Contact information: [address] [place]  [address] [phone]                     Diet and Activity recommendation:  As advised  Discharge Instructions   Discharge Instructions     Call MD for:  difficulty breathing, headache or visual disturbances   Complete by: As directed    Call MD for:  persistant dizziness or light-headedness   Complete by: As directed    Call MD for:  temperature >100.4   Complete by: As directed    Diet - low sodium heart healthy   Complete by: As directed    Discharge instructions   Complete by: As directed    1)Watch for bleeding while on Blood Thinners--watch for blood in your stool which can make your stool black, maroon, mahogany or red---, blood in your urine which can make your urine pink or red, nosebleeds , also watch for possible bruising -You are taking Apixaban /Eliquis --- which is a blood thinner--- be careful to avoid injury or falls  2)Avoid ibuprofen/Advil/Aleve/Motrin/Goody Powders/Naproxen/BC powders/Meloxicam/Diclofenac /Indomethacin and other Nonsteroidal anti-inflammatory medications as these will make you more likely to bleed and can cause stomach ulcers, can also cause Kidney problems.   3) repeat CBC and BMP blood test in about 4 to 5 days from now  4)Patient  needs a lot of encouragement to eat and drink--- please give Ensure nutritional supplements 1 can 3 times daily   Increase activity slowly   Complete by: As directed         Discharge Medications     Allergies as of 03/31/2024       Reactions   Penicillins Other (See Comments)   Has patient had a PCN reaction causing immediate rash, facial/tongue/throat swelling, SOB or lightheadedness with hypotension: Unknown Has patient had a PCN reaction causing severe rash involving mucus membranes or skin necrosis: Unknown Has patient had a PCN reaction that required hospitalization: Unknown Has patient had a  PCN reaction occurring within the last 10 years: Unknown If all of the above answers are NO, then may proceed with Cephalosporin use.   Tetanus Toxoid Swelling        Medication List     STOP taking these medications    atorvastatin  20 MG tablet Commonly known as: LIPITOR   digoxin  0.125 MG tablet Commonly known as: LANOXIN    metoprolol  tartrate 100 MG tablet Commonly known as: LOPRESSOR        TAKE these medications    acetaminophen  500 MG tablet Commonly known as: TYLENOL  Take 500  mg by mouth every 6 (six) hours as needed for moderate pain, fever or headache.   apixaban  5 MG Tabs tablet Commonly known as: Eliquis  Take 1 tablet (5 mg total) by mouth 2 (two) times daily. NEEDS CARDIOLOGY APPT FOR REFILLS, CALL OFFICE [phone]   buPROPion  75 MG tablet Commonly known as: WELLBUTRIN  Take 150 mg by mouth every morning.   diltiazem  180 MG 24 hr capsule Commonly known as: Cardizem  CD Take 1 capsule (180 mg total) by mouth daily. What changed:  medication strength how much to take   escitalopram  20 MG tablet Commonly known as: LEXAPRO  Take 1 tablet (20 mg total) by mouth daily.   esomeprazole 40 MG capsule Commonly known as: NEXIUM Take 40 mg by mouth daily.   feeding supplement (ENSURE COMPLETE) Liqd Take 237 mLs by mouth 2 (two) times daily.   levothyroxine  100 MCG tablet Commonly known as: SYNTHROID  Take 100 mcg by mouth daily before breakfast.   melatonin 3 MG Tabs tablet Take 2 tablets (6 mg total) by mouth at bedtime as needed. What changed: when to take this   nystatin  powder Commonly known as: MYCOSTATIN /NYSTOP  Apply 1 Application topically 2 (two) times daily as needed (comfort).   OPTIC-VITES PO Take 2 tablets by mouth every morning.   oxybutynin  5 MG tablet Commonly known as: DITROPAN  Take 5 mg by mouth 2 (two) times daily.   potassium chloride  SA 20 MEQ tablet Commonly known as: KLOR-CON  M Take 1 tablet (20 mEq total) by mouth  daily. What changed:  medication strength how much to take   QC Bacitracin  500 UNIT/GM ointment Generic drug: bacitracin  Apply 1 Application topically 2 (two) times daily. Apply to face, nose, chin, scalp topically two times a day for scratching.   senna 8.6 MG tablet Commonly known as: SENOKOT Take 2 tablets (17.2 mg total) by mouth at bedtime. What changed:  how much to take when to take this   traMADol  50 MG tablet Commonly known as: ULTRAM  Take 1 tablet (50 mg total) by mouth 2 (two) times daily. 0700 and 1900 What changed: Another medication with the same name was removed. Continue taking this medication, and follow the directions you see here.   Vitamin D  50 MCG (2000 UT) tablet Take 2,000 Units by mouth every morning.   Voltaren  1 % Gel Generic drug: diclofenac  Sodium Apply 2 g topically 2 (two) times daily. Apply to right knee and shoulder topically every morning and at bedtime for pain.       Major procedures and Radiology Reports - PLEASE review detailed and final reports for all details, in brief -   ECHOCARDIOGRAM COMPLETE Result Date: 03/24/2024    ECHOCARDIOGRAM REPORT   Patient Name:   Denise Pugh Date of Exam: 03/23/2024 Medical Rec #:  [id number]     Height:       63.0 in Accession #:    [id number]    Weight:       170.0 lb Date of Birth:  01-27-1929     BSA:          1.804 m Patient Age:    89 years      BP:           161/59 mmHg Patient Gender: F             HR:           74 bpm. Exam Location:  [facility] Procedure: 2D Echo, Cardiac Doppler and Color Doppler (Both Spectral and  Color            Flow Doppler were utilized during procedure). Indications:    Syncope R55  History:        Patient has prior history of Echocardiogram examinations, most                 recent 06/18/2017. Pacemaker, Arrythmias:Atrial Fibrillation,                 Signs/Symptoms:Syncope; Risk Factors:Dyslipidemia and                 Hypertension.  Sonographer:    Aida Pizza RCS Referring  Phys: [id number] CAROLE N HALL IMPRESSIONS  1. Left ventricular ejection fraction, by estimation, is 60 to 65%. The left ventricle has normal function. The left ventricle has no regional wall motion abnormalities. There is moderate left ventricular hypertrophy of the septal segment. Diastolic function could not be evaluated due to paced rhythm. There is the interventricular septum is flattened in systole and diastole, consistent with right ventricular pressure and volume overload.  2. Right ventricular systolic function is normal. The right ventricular size is normal. There is moderately elevated pulmonary artery systolic pressure. The estimated right ventricular systolic pressure is 56.7 mmHg.  3. Left atrial size was severely dilated.  4. Right atrial size was severely dilated.  5. The mitral valve is degenerative. Mild mitral valve regurgitation. No evidence of mitral stenosis.  6. The tricuspid valve is abnormal. Tricuspid valve regurgitation is moderate, functional from severe right atrial enlargement.  7. The aortic valve is tricuspid. Aortic valve regurgitation is moderate to severe. No evidence of holodiastolic flow reversal noted in the descending aorta. Mild aortic valve stenosis. Aortic regurgitation PHT measures 518 msec. Aortic valve area, by VTI measures 1.13 cm. Aortic valve mean gradient measures 9.0 mmHg. Aortic valve Vmax measures 2.17 m/s.  8. The inferior vena cava is dilated in size with >50% respiratory variability, suggesting right atrial pressure of 8 mmHg. Comparison(s): No prior Echocardiogram. FINDINGS  Left Ventricle: Left ventricular ejection fraction, by estimation, is 60 to 65%. The left ventricle has normal function. The left ventricle has no regional wall motion abnormalities. Strain was performed and the global longitudinal strain is indeterminate. The left ventricular internal cavity size was normal in size. There is moderate left ventricular hypertrophy of the septal segment. The  interventricular septum is flattened in systole and diastole, consistent with right ventricular pressure and volume overload. Left ventricular diastolic function could not be evaluated due to paced rhythm. Diastolic function could not be evaluated due to paced rhythm. Normal left ventricular filling pressure. Right Ventricle: The right ventricular size is normal. No increase in right ventricular wall thickness. Right ventricular systolic function is normal. There is moderately elevated pulmonary artery systolic pressure. The tricuspid regurgitant velocity is 3.49 m/s, and with an assumed right atrial pressure of 8 mmHg, the estimated right ventricular systolic pressure is 56.7 mmHg. Left Atrium: Left atrial size was severely dilated. Right Atrium: Right atrial size was severely dilated. Pericardium: There is no evidence of pericardial effusion. Mitral Valve: The mitral valve is degenerative in appearance. Mild mitral valve regurgitation. No evidence of mitral valve stenosis. Tricuspid Valve: The tricuspid valve is abnormal. Tricuspid valve regurgitation is moderate . No evidence of tricuspid stenosis. Aortic Valve: The aortic valve is tricuspid. Aortic valve regurgitation is moderate to severe. Aortic regurgitation PHT measures 518 msec. Mild aortic stenosis is present. Aortic valve mean gradient measures 9.0 mmHg. Aortic valve peak gradient measures  18.9 mmHg. Aortic valve area, by VTI measures 1.13 cm. Pulmonic Valve: The pulmonic valve was normal in structure. Pulmonic valve regurgitation is trivial. No evidence of pulmonic stenosis. Aorta: The aortic root is normal in size and structure. Venous: The inferior vena cava is dilated in size with greater than 50% respiratory variability, suggesting right atrial pressure of 8 mmHg. IAS/Shunts: No atrial level shunt detected by color flow Doppler. Additional Comments: 3D was performed not requiring image post processing on an independent workstation and was  indeterminate. A device lead is visualized.  LEFT VENTRICLE PLAX 2D LVIDd:         4.40 cm LVIDs:         2.90 cm LV PW:         1.10 cm LV IVS:        1.30 cm LVOT diam:     1.60 cm LV SV:         44 LV SV Index:   25 LVOT Area:     2.01 cm  RIGHT VENTRICLE TAPSE (M-mode): 1.6 cm LEFT ATRIUM             Index        RIGHT ATRIUM           Index LA diam:        4.85 cm 2.69 cm/m   RA Area:     31.50 cm LA Vol (A2C):   98.4 ml 54.53 ml/m  RA Volume:   114.00 ml 63.18 ml/m LA Vol (A4C):   84.2 ml 46.66 ml/m LA Biplane Vol: 92.5 ml 51.26 ml/m  AORTIC VALVE AV Area (Vmax):    1.20 cm AV Area (Vmean):   1.19 cm AV Area (VTI):     1.13 cm AV Vmax:           217.50 cm/s AV Vmean:          138.000 cm/s AV VTI:            0.390 m AV Peak Grad:      18.9 mmHg AV Mean Grad:      9.0 mmHg LVOT Vmax:         130.00 cm/s LVOT Vmean:        81.800 cm/s LVOT VTI:          0.220 m LVOT/AV VTI ratio: 0.56 AI PHT:            518 msec  AORTA Ao Root diam: 3.10 cm MITRAL VALVE                TRICUSPID VALVE MV Area (PHT): 4.60 cm     TR Peak grad:   48.7 mmHg MV Decel Time: 165 msec     TR Vmax:        349.00 cm/s MR Peak grad: 114.1 mmHg MR Mean grad: 79.0 mmHg     SHUNTS MR Vmax:      534.00 cm/s   Systemic VTI:  0.22 m MR Vmean:     417.0 cm/s    Systemic Diam: 1.60 cm MV E velocity: 152.00 cm/s MV A velocity: 49.80 cm/s MV E/A ratio:  3.05 Vishnu Priya Mallipeddi Electronically signed by Diannah Late Mallipeddi Signature Date/Time: 03/24/2024/3:13:01 PM    Final    DG Chest Portable 1 View Result Date: 03/22/2024 EXAM: 1 VIEW(S) XRAY OF THE CHEST 03/22/2024 04:49:00 PM COMPARISON: None available. CLINICAL HISTORY: Syncope FINDINGS: LINES, TUBES AND DEVICES: Left pectoral face of the device. LUNGS AND  PLEURA: Small bilateral pleural effusions with associated basilar atelectasis. Pneumonia is not excluded. Stable clinically. No pneumothorax. HEART AND MEDIASTINUM: Left pectoral pacemaker device. No acute abnormality of  the cardiac and mediastinal silhouettes. BONES AND SOFT TISSUES: No acute osseous abnormality. IMPRESSION: 1. Small bilateral pleural effusions with associated basilar atelectasis. Pneumonia not excluded. Electronically signed by: Vanetta Chou MD 03/22/2024 05:47 PM EST RP Workstation: [id number]   CUP PACEART REMOTE DEVICE CHECK Result Date: 03/03/2024 PPM Scheduled remote reviewed. Normal device function.  Presenting rhythm:  AF/VP Known AF, Eliquis  per PA report 344 fast AV c/w AF with rapid ventricular response, longest duration , 23sec, HR's 140-200.  Recent improvement in rates per trends Next remote transmission per protocol. LA, CVRS  Today   Subjective    Denise Pugh today has no new complaints - Appetite is not great, oral intake is not great No vomiting, no diarrhea No fevers, no cough          Patient has been seen and examined prior to discharge   Objective   Blood pressure (!) 157/61, pulse 70, temperature 98.5 F (36.9 C), resp. rate 15, height 5' 3 (1.6 m), weight 77.1 kg, SpO2 97%.   Intake/Output Summary (Last 24 hours) at 03/31/2024 1055 Last data filed at 03/31/2024 0538 Gross per 24 hour  Intake 240 ml  Output 300 ml  Net -60 ml    Exam Gen:- Awake Alert, no acute distress  HEENT:- [place].AT, No sclera icterus Neck-Supple Neck,No JVD,.  Lungs-  CTAB , good air movement bilaterally CV- S1, S2 normal, regular, 2/6 SM, pacemaker in situ Abd-  +ve B.Sounds, Abd Soft, No tenderness,    Extremity/Skin:- No  edema,   good pulses Psych-affect is appropriate, oriented x3 Neuro-generalized weakness, no new focal deficits, no tremors    Data Review   CBC w Diff:  Lab Results  Component Value Date   WBC 8.6 03/23/2024   HGB 13.3 03/23/2024   HGB 14.2 03/30/2018   HCT 38.9 03/23/2024   HCT 42.2 03/30/2018   PLT 192 03/23/2024   PLT 191 03/30/2018   LYMPHOPCT 25 03/22/2024   MONOPCT 13 03/22/2024   EOSPCT 0 03/22/2024   BASOPCT 0 03/22/2024  CMP:   Lab Results  Component Value Date   NA 135 03/30/2024   NA 134 03/30/2018   K 2.4 (LL) 03/30/2024   CL 96 (L) 03/30/2024   CO2 32 03/30/2024   BUN 11 03/30/2024   BUN 13 03/30/2018   CREATININE 0.64 03/30/2024   CREATININE 0.98 05/08/2014   PROT 6.8 03/22/2024   ALBUMIN 2.7 (L) 03/30/2024   BILITOT 1.5 (H) 03/22/2024   ALKPHOS 124 03/22/2024   AST 26 03/22/2024   ALT 13 03/22/2024   Total Discharge time is about 33 minutes  Rendall Carwin M.D on 03/31/2024 at 10:55 AM  Go to [email or web address] -  for contact info  [facility] - Office  [phone]   "

## 2024-04-09 NOTE — Progress Notes (Unsigned)
 " Cardiology Office Note:  .   Date:  04/09/2024  ID:  Denise Pugh Pugh, DOB 1928-07-05, MRN [id number] PCP: Shona Norleen PEDLAR, MD  [facility] Providers Cardiologist:  Debby Sor, MD (Inactive) { Click to update primary MD,subspecialty MD or APP then REFRESH:1}   History of Present Illness: .   Denise Pugh is a 89 y.o. female  with PMHx of persistent atrial fibrillation on Eliquis  and digoxin , sick sinus syndrome s/p dual-chamber Medtronic PPM in 07/20/2013 with generator change on 10/14/2022, apical variant HCM (with no HF/VT), HTN, valvular heart disease, OSA, hypothyroidism, chronic anxiety/depression, GERD, chronic anxiety who reports to [facility] office for hospital follow up.   Pertinent cardiac medical history:  Recurrent palpitations since at least 2013; multiple monitors showed SVT up to 185-189 bpm, atrial tachycardia/flutter, intermittent bradycardia, and accelerated idioventricular rhythm. Diffuse T-wave abnormalities noted historically. Stress test (07/2011): breast attenuation artifact; EF 70%, no ischemia or scar.  Tachycardia-bradycardia syndrome with SVT/atrial tachyarrhythmias with subsequent dual-chamber PPM implanted 07/20/2013 (Medtronic; RA/RV 5076 leads). Pacemaker course 10/15/2023: reprogrammed VVI/VVIR due to persistent AF; ventricular pacing burden ~47-90%. Not device-dependent (underlying AF with slow VR ~50 bpm). Generator change 10/15/2022 with Medtronic Azure XT DR and Aigis pouch; chronic leads retained; normal device function, stable lead parameters, no high ventricular rate events. Atrial fibrillation progressed to long-standing persistent/permanent AF (>2 years); failed rhythm control (amiodarone , DCCV).  Managed with rate control (metoprolol   digoxin ) and Eliquis . Prior RVR resolved after digoxin  added. Hospitalized 01/11/2024: Presented after fall with delirium/dehydration/SIRS/Sepsis, hypokalemia and AF with RVR (HR ~120s), felt multifactorial.  Rate  improved with IV fluids and medication optimization (metoprolol  tartrate 150 mg BID, digoxin  0.125 mg daily, diltiazem  120 mg daily); continued Eliquis .   Discharged V-paced with controlled rates (HR ~60s), no recurrent RVR. Hospitalization 03/22/2024-03/31/2024: Admitted for witnessed syncope with bradycardia and severe hypokalemia (K 2.3) PPM interrogation showed moderate pacing dependence (~35% RV paced).  Digoxin  discontinued; diltiazem  increased to 180 mg daily.  Echo suggested progression of valvular disease/elevated PASP ? outpatient follow-up recommended. Discharged on Eliquis  and KCl. Echo 03/24/2023: EF 60 to 65%, moderate LVH of septal segment, + RV pressure/volume overload, normal RV function, moderately elevated PASP with RVSP of 56 mmHg, severely dilated LA/RA Valvular heart disease  Echo 09/15/2012: Mild MV/TR regurgitation, mild pulmonary HTN with PASP 47 mm Echo 03/24/2023: mild MV regurgitation, moderate TV regurgitation, moderate to severe AV regurgitation, mild AV stenosis  Last seen by heartcare during hospital consult in 12/2023 for management of A-fib with RVR in setting of dehydration, agitation/delirium, SIRS/sepsis, hypokalemia. Switched metoprolol  XL to Lopressor  150 mg twice daily and short acting diltiazem  to diltiazem  ER 120 mg daily.  Continued on digoxin  0.125 mg daily, Eliquis  5 mg twice daily, Lipitor 10 mg daily.   Recent hospitalization 03/22/2024-03/31/2024 for a witnessed syncope by family at SNF. Reported unresponsive for a minute with prodrome of nausea. Upon EMS arrival, patient was found to be bradycardiac with elevated BP. Upon ER arrival, BP noted 161/63 with HR 73. Labs notable for hypokalemia K 2.3 and hsTN 60 >58, which was felt secondary to demand ischemia in setting of syncope/hypotension.  CXR showed small bilateral pleural effusions, which was felt secondary to hypoalbuminemia with albumin of 3.  Pacemaker interrogation showed moderate pacing dependency (35% RV  paced) but adequate intrinsic rhythm 65% of time. ECHO 03/24/2023 as above, concerning for progression of valvular disease elevated PASP, was discussed with Dr. Mallipeddi who recommended outpatient cardiology follow up.  Treated with  supplemental potassium and improved K of 3.5.  Discontinued digoxin  due to digitoxin level of 1.6 (upper therapeutic range) with concern for contribution to bradycardia/syncope.  Discontinued Lipitor and Lopressor  due to patient reported noncompliance with no reason given.  Increased Cardizem  CD to 180 mg daily for rate control.  Discharged on KCl 20 mEq daily, Eliquis  5 mg twice daily.   Today, reports ### and denies ###.  Denies chest pain, shortness of breath, palpitations, syncope, presyncope, dizziness, orthopnea, PND, swelling or significant weight changes, acute bleeding, or claudication.   Reports compliance with medications.  Dietary habitats:  Activity level:  Social: Denies tobacco use/alcohol/drug use  Denies any recent hospitalizations or visits to the emergency department.   Plan - Hypokalemia: Order BMP and Mg.  ROS: 10 point review of system has been reviewed and considered negative except ones been listed in the HPI.   Studies Reviewed: SABRA    ECHO IMPRESSIONS 03/23/2024  1. Left ventricular ejection fraction, by estimation, is 60 to 65%. The left ventricle has normal function. The left ventricle has no regional wall motion abnormalities. There is moderate left ventricular hypertrophy of the septal segment. Diastolic  function could not be evaluated due to paced rhythm. There is the  interventricular septum is flattened in systole and diastole, consistent with right ventricular pressure and volume overload.   2. Right ventricular systolic function is normal. The right ventricular  size is normal. There is moderately elevated pulmonary artery systolic pressure. The estimated right ventricular systolic pressure is 56.7 mmHg.   3. Left atrial size was  severely dilated.   4. Right atrial size was severely dilated.   5. The mitral valve is degenerative. Mild mitral valve regurgitation. No evidence of mitral stenosis.   6. The tricuspid valve is abnormal. Tricuspid valve regurgitation is  moderate, functional from severe right atrial enlargement.   7. The aortic valve is tricuspid. Aortic valve regurgitation is moderate to severe. No evidence of holodiastolic flow reversal noted in the descending aorta. Mild aortic valve stenosis. Aortic regurgitation PHT measures 518 msec. Aortic valve area, by  VTI measures 1.13 cm. Aortic valve mean gradient measures 9.0 mmHg. Aortic valve Vmax measures 2.17 m/s.   8. The inferior vena cava is dilated in size with >50% respiratory  variability, suggesting right atrial pressure of 8 mmHg.   Comparison(s): No prior Echocardiogram.  CV Studies: Cardiac studies reviewed are outlined and summarized above. Otherwise please see EMR for full report.   Risk Assessment/Calculations:   {Does this patient have ATRIAL FIBRILLATION?[phone]} No BP recorded.  {Refresh Note OR Click here to enter BP  :1}***       Physical Exam:   VS:  There were no vitals taken for this visit.   Wt Readings from Last 3 Encounters:  03/22/24 169 lb 15.6 oz (77.1 kg)  01/09/24 170 lb (77.1 kg)  10/15/22 158 lb (71.7 kg)    GEN: Well nourished, well developed in no acute distress while sitting in chair.  NECK: No JVD; No carotid bruits CARDIAC: ***RRR, no murmurs, rubs, gallops RESPIRATORY:  Clear to auscultation without rales, wheezing or rhonchi  ABDOMEN: Soft, non-tender, non-distended EXTREMITIES:  No edema; No deformity   ASSESSMENT AND PLAN: .   ***    {Are you ordering a CV Procedure (e.g. stress test, cath, DCCV, TEE, etc)?   Press F2        :[id number]}  Dispo: ***  Signed, Lorette CINDERELLA Kapur, PA-C  "

## 2024-04-10 ENCOUNTER — Ambulatory Visit: Admitting: Physician Assistant

## 2024-06-01 ENCOUNTER — Ambulatory Visit

## 2024-08-31 ENCOUNTER — Ambulatory Visit
